# Patient Record
Sex: Male | Born: 1940
Health system: Southern US, Community
[De-identification: ages and names within clinical notes are randomized; demographics above are authoritative.]

## PROBLEM LIST (undated history)

## (undated) DIAGNOSIS — G4730 Sleep apnea, unspecified: Secondary | ICD-10-CM

## (undated) DIAGNOSIS — I10 Essential (primary) hypertension: Secondary | ICD-10-CM

## (undated) DIAGNOSIS — I2510 Atherosclerotic heart disease of native coronary artery without angina pectoris: Secondary | ICD-10-CM

## (undated) DIAGNOSIS — I5022 Chronic systolic (congestive) heart failure: Secondary | ICD-10-CM

## (undated) DIAGNOSIS — I428 Other cardiomyopathies: Secondary | ICD-10-CM

## (undated) DIAGNOSIS — I509 Heart failure, unspecified: Secondary | ICD-10-CM

## (undated) DIAGNOSIS — K219 Gastro-esophageal reflux disease without esophagitis: Secondary | ICD-10-CM

## (undated) DIAGNOSIS — I454 Nonspecific intraventricular block: Secondary | ICD-10-CM

## (undated) DIAGNOSIS — I4891 Unspecified atrial fibrillation: Secondary | ICD-10-CM

## (undated) DIAGNOSIS — G709 Myoneural disorder, unspecified: Secondary | ICD-10-CM

## (undated) DIAGNOSIS — I4819 Other persistent atrial fibrillation: Secondary | ICD-10-CM

## (undated) DIAGNOSIS — M48061 Spinal stenosis, lumbar region without neurogenic claudication: Secondary | ICD-10-CM

## (undated) DIAGNOSIS — E785 Hyperlipidemia, unspecified: Secondary | ICD-10-CM

## (undated) DIAGNOSIS — I4821 Permanent atrial fibrillation: Secondary | ICD-10-CM

## (undated) DIAGNOSIS — IMO0002 Reserved for concepts with insufficient information to code with codable children: Secondary | ICD-10-CM

## (undated) DIAGNOSIS — Z95810 Presence of automatic (implantable) cardiac defibrillator: Secondary | ICD-10-CM

## (undated) HISTORY — DX: Other persistent atrial fibrillation: I48.19

## (undated) HISTORY — DX: Essential (primary) hypertension: I10

## (undated) HISTORY — DX: Permanent atrial fibrillation: I48.21

## (undated) HISTORY — PX: HERNIA REPAIR: SHX51

## (undated) HISTORY — DX: Chronic systolic (congestive) heart failure: I50.22

## (undated) HISTORY — DX: Atherosclerotic heart disease of native coronary artery without angina pectoris: I25.10

## (undated) HISTORY — PX: LUMBAR LAMINECTOMY: SHX95

## (undated) HISTORY — PX: CATARACT EXTRACTION: SUR2

## (undated) HISTORY — DX: Hyperlipidemia, unspecified: E78.5

## (undated) HISTORY — DX: Spinal stenosis, lumbar region without neurogenic claudication: M48.061

## (undated) HISTORY — DX: Other cardiomyopathies: I42.8

## (undated) HISTORY — DX: Unspecified atrial fibrillation: I48.91

## (undated) HISTORY — DX: Reserved for concepts with insufficient information to code with codable children: IMO0002

## (undated) HISTORY — DX: Nonspecific intraventricular block: I45.4

---

## 2004-04-02 ENCOUNTER — Ambulatory Visit: Payer: Self-pay | Admitting: Specialist

## 2005-05-09 HISTORY — PX: OTHER SURGICAL HISTORY: SHX169

## 2005-12-08 ENCOUNTER — Ambulatory Visit: Payer: Self-pay

## 2006-11-20 ENCOUNTER — Ambulatory Visit: Payer: Self-pay | Admitting: Orthopaedic Surgery

## 2006-12-06 ENCOUNTER — Encounter: Admission: RE | Admit: 2006-12-06 | Discharge: 2006-12-06 | Payer: Self-pay | Admitting: Orthopaedic Surgery

## 2007-01-24 ENCOUNTER — Observation Stay (HOSPITAL_COMMUNITY): Admission: RE | Admit: 2007-01-24 | Discharge: 2007-01-25 | Payer: Self-pay | Admitting: Orthopaedic Surgery

## 2010-06-29 ENCOUNTER — Ambulatory Visit: Payer: Self-pay | Admitting: Gastroenterology

## 2010-07-01 LAB — PATHOLOGY REPORT

## 2010-09-21 NOTE — Op Note (Signed)
NAME:  Chris Price, Chris Price             ACCOUNT NO.:  [id number]   MEDICAL RECORD NO.:  [id number]          PATIENT TYPE:  INP   LOCATION:  5006                         FACILITY:  [facility]   PHYSICIAN:  Sharolyn Douglas, M.D.        DATE OF BIRTH:  [birth year]   DATE OF PROCEDURE:  01/24/2007  DATE OF DISCHARGE:                               OPERATIVE REPORT   DIAGNOSES:  1. Lumbar spinal stenosis.  2. Multilevel lumbar degenerative disk disease and disk herniations.   PROCEDURE PERFORMED:  Lumbar laminectomy, L2 through S1 with wide  decompression of the thecal sac and nerve roots.   SURGEON:  Sharolyn Douglas, M.D.   ASSISTANT:  Alecia Lemming, P.A.   ANESTHESIA:  General endotracheal.   ESTIMATED BLOOD LOSS:  150 cc.   COMPLICATIONS:  None.   COUNTS:  Needle and sponge count correct.   INDICATIONS:  The patient is a pleasant 70 year old gentleman with  chronic progressive back and bilateral lower extremity pain secondary to  severe underlying spinal stenosis due to ligamentum flavum hypertrophy  facet enlargement and disk herniations at multiple levels.  I have been  treating him conservatively for over three years and at this point his  symptoms have progressed to the point that he wishes to proceed with a  laminectomy in hopes of improving his situation.  The risks, benefits,  and alternatives were extensively reviewed and he has elected to  proceed.   DESCRIPTION OF PROCEDURE:  After informed consent he was taken to the  operating room.  He underwent general endotracheal anesthesia without  difficulty, given prophylactic IV antibiotics.  He was carefully turned  prone onto the Wilson frame.  All bony prominences were padded.  The  face and eyes were protected at all times.  The back was prepped and  draped in the usual sterile fashion.   A midline incision was made from L2 down to the sacrum.  Dissection was  carried sharply through the deep fascia and a subperiosteal exposure was  carried  out to the facet joints.  The pars interarticularis was exposed  at each level.  Care was taken to protect the facet joint capsules  themselves.  Intraoperative x-ray was taken to confirm the levels.  Deep  retractors were placed.   We then began a laminectomy by removing the entire spinous process and  lamina of L2, L3, L4, and L5.  A high-speed bur was then used to thin  the lamina.  Using loupes and headlight magnification, I worked closely  along with my P.A., Alecia Lemming, P.A., to complete the decompression.  She  provided suction and protection of the neural elements using the nerve  root retractor.  The ligamentum flavum was removed piecemeal.  We found  severe spinal stenosis, most advanced at L4-L5 and L5-S1 but also quite  significant in L2-L3 and L3-L4.  Even more severe was the lateral recess  narrowing which was secondary to enlargement of the facets and  overgrowth of the ligamentum flavum.  We worked carefully into the  lateral gutters identifying each nerve root, L2, L3, L4, L5,  and S1  bilaterally and decompressing it in the subarticular recess and out  towards the foramen.  There was a large focal and central disk  herniation at L4-L5 which I felt was too large not to address.   Working on the left side the L5 nerve root was mobilized and the disk  was entered with a 15 blade.  Using Epstein curets, the large disk  herniation was decompressed back into the interspace and the fragments  were removed using the pituitary rongeurs.  This significantly  decompressed the L5 nerve roots at this level.  I examined the disk at  the other levels and there was bulging, however, I did not enter the  disk spaces for fear of possibly destabilizing the spine.  A sublaminar  decompression was completed up underneath the L1-L2 interspace.  Hemostasis was achieved using FloSeal, bipolar electrocautery, and  Gelfoam.   The wound was copiously irrigated.  Gelfoam was left over the  exposed  epidural space.  A deep Hemovac drain was left.  The deep fascia was  closed with a running number one Vicryl suture.  The subcutaneous layer  was closed with 2-0 Vicryl and a running 3-0 subcuticular Vicryl suture  on the skin edges.  Dermabond was applied.  The patient was turned  supine, extubated without difficulty, and transferred to recovery in  stable condition.      Sharolyn Douglas, M.D.  Electronically Signed     MC/MEDQ  D:  01/24/2007  T:  01/25/2007  Job:  [id number]   cc:   Sharolyn Douglas, M.D.

## 2011-02-17 LAB — COMPREHENSIVE METABOLIC PANEL
ALT: 25
AST: 26
Calcium: 9.1
GFR calc Af Amer: >60
Sodium: 137
Total Protein: 6.9

## 2011-02-17 LAB — CBC
MCHC: 34
RDW: 14.1 — ABNORMAL HIGH

## 2011-02-17 LAB — URINE CULTURE

## 2011-02-17 LAB — URINALYSIS, ROUTINE W REFLEX MICROSCOPIC
Bilirubin Urine: NEGATIVE
Glucose, UA: NEGATIVE
Hgb urine dipstick: NEGATIVE
Hgb urine dipstick: NEGATIVE
Ketones, ur: NEGATIVE
Specific Gravity, Urine: 1.022
Urobilinogen, UA: 1
pH: 6

## 2011-02-17 LAB — TYPE AND SCREEN: Antibody Screen: NEGATIVE

## 2011-02-17 LAB — DIFFERENTIAL
Eosinophils Absolute: 0.2
Eosinophils Relative: 3
Lymphs Abs: 2.1
Monocytes Relative: 9
Neutrophils Relative %: 58

## 2011-02-17 LAB — PROTIME-INR: INR: 1

## 2011-05-10 HISTORY — PX: PROSTATE BIOPSY: SHX241

## 2011-07-21 ENCOUNTER — Ambulatory Visit: Payer: Self-pay | Admitting: Cardiology

## 2011-08-08 HISTORY — PX: CARDIOVERSION: SHX1299

## 2011-08-15 ENCOUNTER — Ambulatory Visit: Payer: Self-pay | Admitting: Cardiology

## 2011-08-15 DIAGNOSIS — I2510 Atherosclerotic heart disease of native coronary artery without angina pectoris: Secondary | ICD-10-CM

## 2011-08-15 HISTORY — PX: CARDIAC CATHETERIZATION: SHX172

## 2011-08-15 HISTORY — DX: Atherosclerotic heart disease of native coronary artery without angina pectoris: I25.10

## 2011-08-24 ENCOUNTER — Encounter: Payer: Self-pay | Admitting: *Deleted

## 2011-08-25 ENCOUNTER — Encounter: Payer: Self-pay | Admitting: Internal Medicine

## 2011-08-25 ENCOUNTER — Ambulatory Visit (INDEPENDENT_AMBULATORY_CARE_PROVIDER_SITE_OTHER): Payer: Medicare PPO | Admitting: Internal Medicine

## 2011-08-25 VITALS — BP 128/84 | HR 89 | Ht 66.0 in | Wt 225.0 lb

## 2011-08-25 DIAGNOSIS — I429 Cardiomyopathy, unspecified: Secondary | ICD-10-CM | POA: Insufficient documentation

## 2011-08-25 DIAGNOSIS — I4891 Unspecified atrial fibrillation: Secondary | ICD-10-CM

## 2011-08-25 DIAGNOSIS — R0602 Shortness of breath: Secondary | ICD-10-CM

## 2011-08-25 DIAGNOSIS — I454 Nonspecific intraventricular block: Secondary | ICD-10-CM | POA: Insufficient documentation

## 2011-08-25 DIAGNOSIS — I5022 Chronic systolic (congestive) heart failure: Secondary | ICD-10-CM

## 2011-08-25 HISTORY — DX: Chronic systolic (congestive) heart failure: I50.22

## 2011-08-25 MED ORDER — FUROSEMIDE 20 MG PO TABS: 20.0000 mg | ORAL_TABLET | Freq: Every day | ORAL | Status: DC

## 2011-08-25 MED ORDER — LISINOPRIL 20 MG PO TABS: 20.0000 mg | ORAL_TABLET | Freq: Every day | ORAL | Status: DC

## 2011-08-25 MED ORDER — SPIRONOLACTONE 12.5 MG HALF TABLET: 12.5000 mg | ORAL_TABLET | Freq: Every day | ORAL | Status: DC

## 2011-08-25 MED ORDER — AMIODARONE HCL 400 MG PO TABS: ORAL_TABLET | ORAL | Status: DC

## 2011-08-25 NOTE — Progress Notes (Signed)
Addended by: Tarri Fuller on: 08/25/2011 04:49 PM   Modules accepted: Orders

## 2011-08-25 NOTE — Assessment & Plan Note (Signed)
As above. We'll discontinue his hydrochlorothiazide and begin Lasix. In addition we'll add Aldactone as noted. We'll check his metabolic profile next week.

## 2011-08-25 NOTE — Assessment & Plan Note (Signed)
The patient has persistent atrial fibrillation and failed cardioversion. It is concurrent with his deteriorating functional status. Notably his left atrial dimension was 5.1.  I concur with Dr. Phylliss Bob recommendation that we try to restore sinus rhythm. It would be appropriate to use an antiarrhythmic drug first. His QTC is too long to allow for the use of a type III antiarrhythmic and so we would use amiodarone. I have reviewed side effects. We would anticipate doing this prior to ICD implantation so as to be able to effect cardioversion at the time of DFT testing.

## 2011-08-25 NOTE — Assessment & Plan Note (Addendum)
As noted above. We will also add Aldactone because of benefits for mortality morbidity reduction  His had persistent left ventricular dysfunction now times years on appropriate guidelines recommend medical therapy. We will plan to undertake ICD implantation which would be a dual-chamber device as we hope to restore sinus rhythm.Have reviewed the potential benefits and risks of ICD implantation including but not limited to death, perforation of heart or lung, lead dislodgement, infection,  device malfunction and inappropriate shocks.  The  Family express understanding  and are willing to proceed.

## 2011-08-25 NOTE — Assessment & Plan Note (Signed)
He is not a candidate for CRT because of his QRS morphology and narrow-relative QRS

## 2011-08-25 NOTE — Patient Instructions (Addendum)
STOP LISINIOPRIL/HCTZ AND START LISINOPRIL 20 MG DAILY, START LASIX 20 MG DAILY AND START AMIODARONE 400 MG TAKE 400 MG TWICE DAILY FOR 2 WEEKS THEN DECREASE TO 400 MG DAILY, START ALDACTONE 12.5 MG DAILY  WE WILL CALL YOU IN THE NEXT FEW DAYS AS TO WHEN YOU WILL BE SCHEDULED FOR AN ICD IMPLANT TO BE DONE @ [facility] WITH DR. Graciela Husbands, DR. KLEIN'S NURSE HEATHER MCGHEE, RN WILL CALL YOU OR YOU CAN CALL [phone]   BMET @ [facility] 09/02/11 @ 2:30

## 2011-08-25 NOTE — Progress Notes (Signed)
CARDIOLOGY CONSULT NOTE  Patient ID: [name], MRN: [id number], DOB/AGE: Jun 18, 1940 71 y.o. Admit date: (Not on file) Date of Consult: 08/25/2011  Primary Physician: Chris Cheng, MD, MD Primary Cardiologist: AP  Chief Complaint: ? ICD   HPI Chris Price is a 71 y.o. male : seen at the request of Dr AP for consideration of ICD  Implantation and also management of AFIB  The cardiomyopathy identified about 3 year ago>>EF 27%  This improved with treatment with sleep apnea.  Presumably this is felt to be nonischemic.  Last fall he started noticing some exercise associated breathlessness. There is no edema. He saw his PCP who identified atrial fibrillation in early February as a new finding and he was referred back to Dr. AP.   He previously been on ACE inhibitor therapy and beta blocker therapy for the aforementioned cardiomyopathy  and pradaxa was started. About a month ago attempted cardioversion failed x3.  Intercurrent evaluation has included an echo that demonstrated left atrial dimension of 51, left ventricular function  moderately-severely depressed; he quantitated ejection fraction of 25% by Myoview and a catheterization demonstrated diffuse nonobstructive disease.  Thromboembolic risk factors notable for hypertension left ventricular dysfunction and age.  Electrocardiogram demonstrates a nonspecific IVCD and a QRS duration of about 120 ms.    Past Medical History  Diagnosis Date  . Lumbar spinal stenosis   . Degenerative disk disease     lumbar  . Disc herniation   . Hyperlipidemia   . Cardiomyopathy     moderate to severe  . Arrhythmia     a-fib  . Sleep apnea     obstructive; on CPAP  . Hypertension   . Coronary artery disease 08/15/2011    insignificant CAD with 60% mid LAD, 60% proximal RCA and 30 % stenossis left circumflex.      Surgical History:  Past Surgical History  Procedure Date  . Lumbar laminectomy     L2 through S1 with wide  decompression of the thecal sac and nerve roots.  . Cardioversion 08/2011    [facility]  . Cardiac catheterization 08/15/2011    [facility]      Home Meds: Prior to Admission medications   Medication Sig Start Date End Date Taking? Authorizing Provider  amLODipine (NORVASC) 5 MG tablet Take 5 mg by mouth daily.   Yes Historical Provider, MD  atorvastatin (LIPITOR) 40 MG tablet Take 40 mg by mouth daily.   Yes Historical Provider, MD  carvedilol (COREG) 25 MG tablet Take 25 mg by mouth 2 (two) times daily with a meal.   Yes Historical Provider, MD  Coenzyme Q10-Levocarnitine (CO Q-10 PLUS PO) Take by mouth.   Yes Historical Provider, MD  dabigatran (PRADAXA) 150 MG CAPS Take 150 mg by mouth 2 (two) times daily.   Yes Historical Provider, MD  fish oil-omega-3 fatty acids 1000 MG capsule Take 2 g by mouth 3 (three) times daily.   Yes Historical Provider, MD  Glucosamine HCl 1500 MG TABS Take by mouth daily.   Yes Historical Provider, MD  lisinopril-hydrochlorothiazide (PRINZIDE,ZESTORETIC) 20-25 MG per tablet Take 1 tablet by mouth daily.   Yes Historical Provider, MD  naproxen sodium (ANAPROX) 220 MG tablet Take 220 mg by mouth 2 (two) times daily as needed.   Yes Historical Provider, MD  omeprazole (PRILOSEC) 20 MG capsule Take 20 mg by mouth daily at 2 PM daily at 2 PM.   Yes Historical Provider, MD  SAW PALMETTO, SERENOA REPENS, PO Take  by mouth.   Yes Historical Provider, MD    Allergies: Allergies not on file  History   Social History  . Marital Status: Married    Spouse Name: N/A    Number of Children: N/A  . Years of Education: N/A   Occupational History  . Not on file.   Social History Main Topics  . Smoking status: Never Smoker   . Smokeless tobacco: Not on file  . Alcohol Use: No  . Drug Use: No  . Sexually Active:    Other Topics Concern  . Not on file   Social History Narrative  . No narrative on file     Family History  Problem Relation Age of Onset  . Heart attack  Father   . Heart attack Brother      ROS:  Please see the history of present illness.     All other systems reviewed and negative except for lifelong esotropia   Physical Exam: Blood pressure 128/84, height 5\' 6"  (1.676 m), weight 225 lb (102.059 kg). General: Well developed, well nourished male in no acute distress. Head: Normocephalic, atraumatic, sclera non-icteric, no xanthomas, nares are without discharge. Lymph Nodes:  none Neck: Negative for carotid bruits. JVD 9-10cm Lungs: Clear bilaterally to auscultation without wheezes, rales, or rhonchi. Breathing is unlabored. Heart:Irregullarly irrregular with S1 S2. No murmurs, rubs, or gallops appreciated. Abdomen: Soft, non-tender, non-distended with normoactive bowel sounds. No hepatomegaly. No rebound/guarding. No obvious abdominal masses. Msk:  Strength and tone appear normal for age. Extremities: No clubbing or cyanosis. No edema.  Distal pedal pulses are 2+ and equal bilaterally. Skin: Warm and Dry BAck without kyhposis Neuro: Alert and oriented X 3. CN III-XII intact Grossly normal sensory and motor function . Psych:  Responds to questions appropriately with a normal affect.      Labs: Cardiac Enzymes No results found for this basename: CKTOTAL:4,CKMB:4,TROPONINI:4 in the last 72 hours CBC Lab Results  Component Value Date   WBC 7.3 01/23/2007   HGB 15.0 01/23/2007   HCT 44.0 01/23/2007   MCV 96.7 01/23/2007   PLT 259 01/23/2007     EKG: Atrial fibrillation at 89 with nonspecific IVCD notably Q waves in lead aVL and V6 and a QRS duration of 122 axis is leftward -81   Assessment and Plan:  Chris Price

## 2011-08-29 ENCOUNTER — Telehealth: Payer: Self-pay

## 2011-08-29 LAB — CBC
HCT: 44.2 % (ref 40.0–52.0)
Platelet: 199 10*3/uL (ref 150–440)
RBC: 4.59 10*6/uL (ref 4.40–5.90)
RDW: 14.7 % — ABNORMAL HIGH (ref 11.5–14.5)
WBC: 7.6 10*3/uL (ref 3.8–10.6)

## 2011-08-29 LAB — COMPREHENSIVE METABOLIC PANEL
Albumin: 3.9 g/dL (ref 3.4–5.0)
Alkaline Phosphatase: 60 U/L (ref 50–136)
BUN: 16 mg/dL (ref 7–18)
Bilirubin,Total: 0.8 mg/dL (ref 0.2–1.0)
Co2: 25 mmol/L (ref 21–32)
Creatinine: 1.26 mg/dL (ref 0.60–1.30)
EGFR (African American): >60
EGFR (Non-African Amer.): 57 — ABNORMAL LOW
SGOT(AST): 31 U/L (ref 15–37)
Sodium: 142 mmol/L (ref 136–145)
Total Protein: 7.5 g/dL (ref 6.4–8.2)

## 2011-08-29 LAB — CK TOTAL AND CKMB (NOT AT ARMC): CK, Total: 328 U/L — ABNORMAL HIGH (ref 35–232)

## 2011-08-29 LAB — TROPONIN I: Troponin-I: <0.02 ng/mL

## 2011-08-29 NOTE — Telephone Encounter (Signed)
Would like to switch to Auto Mode for two weeks on CPAP machine since his AHI is 6.3; normal is below 5 close to 0 as possible. Spoke with Sigmund Hazel respiratory therapist contact number is [phone].

## 2011-08-29 NOTE — Telephone Encounter (Signed)
Who ever is in charge should do this  Thanks sharon

## 2011-08-30 ENCOUNTER — Inpatient Hospital Stay: Payer: Self-pay | Admitting: Internal Medicine

## 2011-08-30 LAB — CK TOTAL AND CKMB (NOT AT ARMC): CK-MB: 5.6 ng/mL — ABNORMAL HIGH (ref 0.5–3.6)

## 2011-08-30 LAB — PRO B NATRIURETIC PEPTIDE: B-Type Natriuretic Peptide: 1469 pg/mL — ABNORMAL HIGH (ref 0–125)

## 2011-08-30 LAB — TROPONIN I: Troponin-I: <0.02 ng/mL

## 2011-08-30 NOTE — Telephone Encounter (Signed)
Spoke to [employer] regarding the CPAP auto; she will be the one to take care of this for the patient.

## 2011-08-30 NOTE — Telephone Encounter (Signed)
LMOM to have Misty Stanley contact our office.

## 2011-08-31 LAB — BASIC METABOLIC PANEL
Chloride: 106 mmol/L (ref 98–107)
Co2: 27 mmol/L (ref 21–32)
Creatinine: 1.1 mg/dL (ref 0.60–1.30)
EGFR (African American): >60
EGFR (Non-African Amer.): >60
Glucose: 101 mg/dL — ABNORMAL HIGH (ref 65–99)
Osmolality: 281 (ref 275–301)
Potassium: 4 mmol/L (ref 3.5–5.1)
Sodium: 140 mmol/L (ref 136–145)

## 2011-08-31 LAB — LIPID PANEL
HDL Cholesterol: 40 mg/dL (ref 40–60)
Ldl Cholesterol, Calc: 127 mg/dL — ABNORMAL HIGH (ref 0–100)
Triglycerides: 77 mg/dL (ref 0–200)

## 2011-09-02 ENCOUNTER — Telehealth: Payer: Self-pay

## 2011-09-02 ENCOUNTER — Other Ambulatory Visit: Payer: Medicare PPO

## 2011-09-02 DIAGNOSIS — I4891 Unspecified atrial fibrillation: Secondary | ICD-10-CM

## 2011-09-02 DIAGNOSIS — R0602 Shortness of breath: Secondary | ICD-10-CM

## 2011-09-02 NOTE — Telephone Encounter (Signed)
Patient came in for BMET today; just got out of [facility].  He was thinking he should change his pradaxa to warfarin before cardioversion. Did you want the patient to change from pradaxa to warfarin before having cardioversion? The cardioversion is not set up yet. I will also set up the defib implant 3 weeks after cardioversion as Dr. Graciela Husbands requested. Please advise.

## 2011-09-03 LAB — BASIC METABOLIC PANEL
BUN/Creatinine Ratio: 20 (ref 10–22)
BUN: 24 mg/dL (ref 8–27)
CO2: 22 mmol/L (ref 20–32)
Chloride: 97 mmol/L (ref 97–108)
Glucose: 88 mg/dL (ref 65–99)

## 2011-09-05 NOTE — Telephone Encounter (Signed)
S  He can continue the pradaxa  We should try and do DCCV asap (sort of?) when you set that up the issue is whether he has been taking it daily for >3 weeks or we will need a TEE Thanks steve

## 2011-09-06 ENCOUNTER — Encounter (HOSPITAL_COMMUNITY): Payer: Self-pay | Admitting: Pharmacy Technician

## 2011-09-06 NOTE — Telephone Encounter (Signed)
Patient will have a cardioversion in the EP lab per Dr. Graciela Husbands for Sep 08, 2011 arrive at 5:30. The patient was instructed of medications to stop which was furosemide. The patient will remain on all other medications. The patient understands instructions of medications and where to go for cardioversion.

## 2011-09-08 ENCOUNTER — Encounter (HOSPITAL_COMMUNITY): Payer: Self-pay | Admitting: General Practice

## 2011-09-08 ENCOUNTER — Ambulatory Visit (HOSPITAL_COMMUNITY)
Admission: RE | Admit: 2011-09-08 | Discharge: 2011-09-09 | Disposition: A | Discharge status: Home / Self Care | Payer: Medicare PPO | Source: Ambulatory Visit | Attending: Internal Medicine | Admitting: Internal Medicine

## 2011-09-08 ENCOUNTER — Ambulatory Visit (HOSPITAL_COMMUNITY): Payer: Medicare PPO

## 2011-09-08 ENCOUNTER — Encounter (HOSPITAL_COMMUNITY)
Admission: RE | Disposition: A | Discharge status: Home / Self Care | Payer: Self-pay | Source: Ambulatory Visit | Attending: Internal Medicine

## 2011-09-08 DIAGNOSIS — I428 Other cardiomyopathies: Secondary | ICD-10-CM

## 2011-09-08 DIAGNOSIS — I5022 Chronic systolic (congestive) heart failure: Secondary | ICD-10-CM

## 2011-09-08 DIAGNOSIS — I10 Essential (primary) hypertension: Secondary | ICD-10-CM | POA: Insufficient documentation

## 2011-09-08 DIAGNOSIS — I4891 Unspecified atrial fibrillation: Secondary | ICD-10-CM

## 2011-09-08 DIAGNOSIS — R0602 Shortness of breath: Secondary | ICD-10-CM

## 2011-09-08 DIAGNOSIS — Z95810 Presence of automatic (implantable) cardiac defibrillator: Secondary | ICD-10-CM

## 2011-09-08 DIAGNOSIS — I498 Other specified cardiac arrhythmias: Secondary | ICD-10-CM | POA: Insufficient documentation

## 2011-09-08 HISTORY — DX: Myoneural disorder, unspecified: G70.9

## 2011-09-08 HISTORY — PX: IMPLANTABLE CARDIOVERTER DEFIBRILLATOR IMPLANT: SHX5473

## 2011-09-08 HISTORY — DX: Sleep apnea, unspecified: G47.30

## 2011-09-08 HISTORY — DX: Gastro-esophageal reflux disease without esophagitis: K21.9

## 2011-09-08 HISTORY — DX: Heart failure, unspecified: I50.9

## 2011-09-08 HISTORY — PX: OTHER SURGICAL HISTORY: SHX169

## 2011-09-08 HISTORY — DX: Presence of automatic (implantable) cardiac defibrillator: Z95.810

## 2011-09-08 LAB — APTT: aPTT: 61 seconds — ABNORMAL HIGH (ref 24–37)

## 2011-09-08 LAB — CBC
HCT: 44.7 % (ref 39.0–52.0)
Hemoglobin: 15.8 g/dL (ref 13.0–17.0)
MCHC: 35.3 g/dL (ref 30.0–36.0)

## 2011-09-08 LAB — SURGICAL PCR SCREEN: Staphylococcus aureus: NEGATIVE

## 2011-09-08 LAB — PROTIME-INR: INR: 1.63 — ABNORMAL HIGH (ref 0.00–1.49)

## 2011-09-08 SURGERY — IMPLANTABLE CARDIOVERTER DEFIBRILLATOR IMPLANT

## 2011-09-08 MED ORDER — LIDOCAINE HCL (PF) 1 % IJ SOLN
INTRAMUSCULAR | Status: AC
  Filled 2011-09-08: qty 60

## 2011-09-08 MED ORDER — SODIUM CHLORIDE 0.9 % IV SOLN
INTRAVENOUS | Status: AC
  Administered 2011-09-08: 12:00:00 via INTRAVENOUS

## 2011-09-08 MED ORDER — SODIUM CHLORIDE 0.9 % IR SOLN
80.0000 mg | Status: DC
  Filled 2011-09-08: qty 2

## 2011-09-08 MED ORDER — POTASSIUM CHLORIDE CRYS ER 20 MEQ PO TBCR
20.0000 meq | EXTENDED_RELEASE_TABLET | Freq: Every day | ORAL | Status: DC
  Administered 2011-09-08 – 2011-09-09 (×2): 20 meq via ORAL
  Filled 2011-09-08 (×2): qty 1

## 2011-09-08 MED ORDER — ATORVASTATIN CALCIUM 40 MG PO TABS
40.0000 mg | ORAL_TABLET | Freq: Every day | ORAL | Status: DC
  Administered 2011-09-08: 40 mg via ORAL
  Filled 2011-09-08 (×2): qty 1

## 2011-09-08 MED ORDER — CARVEDILOL 25 MG PO TABS
25.0000 mg | ORAL_TABLET | Freq: Two times a day (BID) | ORAL | Status: DC
  Administered 2011-09-08 – 2011-09-09 (×2): 25 mg via ORAL
  Filled 2011-09-08 (×4): qty 1

## 2011-09-08 MED ORDER — LIDOCAINE HCL (PF) 1 % IJ SOLN
INTRAMUSCULAR | Status: AC
  Filled 2011-09-08: qty 30

## 2011-09-08 MED ORDER — AMLODIPINE BESYLATE 5 MG PO TABS
5.0000 mg | ORAL_TABLET | Freq: Every day | ORAL | Status: DC
  Administered 2011-09-09: 5 mg via ORAL
  Filled 2011-09-08: qty 1

## 2011-09-08 MED ORDER — SODIUM CHLORIDE 0.45 % IV SOLN
INTRAVENOUS | Status: DC
  Administered 2011-09-08: 1000 mL via INTRAVENOUS

## 2011-09-08 MED ORDER — MUPIROCIN 2 % EX OINT
TOPICAL_OINTMENT | CUTANEOUS | Status: AC
  Filled 2011-09-08: qty 22

## 2011-09-08 MED ORDER — CEFAZOLIN SODIUM-DEXTROSE 2-3 GM-% IV SOLR
2.0000 g | INTRAVENOUS | Status: DC
  Filled 2011-09-08: qty 50

## 2011-09-08 MED ORDER — MUPIROCIN 2 % EX OINT
TOPICAL_OINTMENT | Freq: Two times a day (BID) | CUTANEOUS | Status: DC
  Administered 2011-09-08: 1 via NASAL
  Filled 2011-09-08: qty 22

## 2011-09-08 MED ORDER — SODIUM CHLORIDE 0.9 % IJ SOLN: 3.0000 mL | INTRAMUSCULAR | Status: DC | PRN

## 2011-09-08 MED ORDER — PANTOPRAZOLE SODIUM 40 MG PO TBEC
40.0000 mg | DELAYED_RELEASE_TABLET | Freq: Every day | ORAL | Status: DC
  Administered 2011-09-08: 40 mg via ORAL
  Filled 2011-09-08: qty 1

## 2011-09-08 MED ORDER — SODIUM CHLORIDE 0.9 % IV SOLN: 250.0000 mL | INTRAVENOUS | Status: DC

## 2011-09-08 MED ORDER — MIDAZOLAM HCL 5 MG/5ML IJ SOLN
INTRAMUSCULAR | Status: AC
  Filled 2011-09-08: qty 5

## 2011-09-08 MED ORDER — FENTANYL CITRATE 0.05 MG/ML IJ SOLN
INTRAMUSCULAR | Status: AC
  Filled 2011-09-08: qty 2

## 2011-09-08 MED ORDER — OXYCODONE-ACETAMINOPHEN 5-325 MG PO TABS
1.0000 | ORAL_TABLET | ORAL | Status: DC | PRN
  Administered 2011-09-08: 2 via ORAL
  Administered 2011-09-08: 1 via ORAL
  Administered 2011-09-09: 2 via ORAL
  Filled 2011-09-08 (×2): qty 2

## 2011-09-08 MED ORDER — AMIODARONE HCL 200 MG PO TABS
400.0000 mg | ORAL_TABLET | Freq: Every day | ORAL | Status: DC
  Administered 2011-09-09: 400 mg via ORAL
  Filled 2011-09-08: qty 2

## 2011-09-08 MED ORDER — CEFAZOLIN SODIUM 1-5 GM-% IV SOLN
1.0000 g | Freq: Four times a day (QID) | INTRAVENOUS | Status: AC
  Administered 2011-09-08 – 2011-09-09 (×3): 1 g via INTRAVENOUS
  Filled 2011-09-08 (×3): qty 50

## 2011-09-08 MED ORDER — FUROSEMIDE 20 MG PO TABS
20.0000 mg | ORAL_TABLET | Freq: Two times a day (BID) | ORAL | Status: DC
  Filled 2011-09-08 (×2): qty 1

## 2011-09-08 MED ORDER — DABIGATRAN ETEXILATE MESYLATE 150 MG PO CAPS
150.0000 mg | ORAL_CAPSULE | Freq: Two times a day (BID) | ORAL | Status: DC
  Administered 2011-09-08 – 2011-09-09 (×2): 150 mg via ORAL
  Filled 2011-09-08 (×3): qty 1

## 2011-09-08 MED ORDER — OXYCODONE-ACETAMINOPHEN 5-325 MG PO TABS
2.0000 | ORAL_TABLET | ORAL | Status: DC | PRN
  Filled 2011-09-08: qty 1

## 2011-09-08 MED ORDER — ONDANSETRON HCL 4 MG/2ML IJ SOLN: 4.0000 mg | Freq: Four times a day (QID) | INTRAMUSCULAR | Status: DC | PRN

## 2011-09-08 MED ORDER — CHLORHEXIDINE GLUCONATE 4 % EX LIQD
60.0000 mL | Freq: Once | CUTANEOUS | Status: DC
  Filled 2011-09-08: qty 60

## 2011-09-08 MED ORDER — SODIUM CHLORIDE 0.9 % IJ SOLN: 3.0000 mL | Freq: Two times a day (BID) | INTRAMUSCULAR | Status: DC

## 2011-09-08 MED ORDER — FUROSEMIDE 20 MG PO TABS
20.0000 mg | ORAL_TABLET | Freq: Every day | ORAL | Status: DC
  Administered 2011-09-08 – 2011-09-09 (×2): 20 mg via ORAL
  Filled 2011-09-08 (×2): qty 1

## 2011-09-08 MED ORDER — SPIRONOLACTONE 12.5 MG HALF TABLET
12.5000 mg | ORAL_TABLET | Freq: Every day | ORAL | Status: DC
  Administered 2011-09-08 – 2011-09-09 (×2): 12.5 mg via ORAL
  Filled 2011-09-08 (×2): qty 1

## 2011-09-08 MED ORDER — ACETAMINOPHEN 325 MG PO TABS
325.0000 mg | ORAL_TABLET | ORAL | Status: DC | PRN
  Administered 2011-09-08: 650 mg via ORAL
  Filled 2011-09-08: qty 2

## 2011-09-08 NOTE — Op Note (Signed)
Chris Price, Chris Price NO.:  [id number]  MEDICAL RECORD NO.:  [id number]  LOCATION:  2503                         FACILITY:  [facility]  PHYSICIAN:  Duke Salvia, MD, FACCDATE OF BIRTH:  [birth year]  DATE OF PROCEDURE:  09/08/2011 DATE OF DISCHARGE:                              OPERATIVE REPORT   PREOPERATIVE DIAGNOSIS:  Nonischemic cardiomyopathy, sinus bradycardia.  POSTOPERATIVE DIAGNOSIS:  Nonischemic cardiomyopathy, sinus bradycardia.  PROCEDURES:  Dual-chamber defibrillator implantation with intraoperative defibrillation threshold testing.  Following obtained informed consent, the patient was brought to the electrophysiology laboratory and placed on the fluoroscopic table in supine position.  After routine prep and drape of left upper chest, lidocaine was infiltrated in prepectoral subclavicular region.  An incision was made and carried down to layer of the prepectoral fascia using electrocautery and sharp dissection.  A pocket was formed similarly.  Hemostasis was obtained.  Thereafter attention was turned to gain access to the extrathoracic left subclavian vein which was accomplished with modest difficulty.  Because of the really cephalad course of the vein, subsequently 2 venipunctures were accomplished without aspiration of air or puncture of the artery. Guidewires were placed and retained sequentially.  A 9-French and 7- French sheaths were placed, which were passed a Medtronic [id number] single coil defibrillator lead, serial #[id number] and a Medtronic 5076 52 cm active fixation, atrial lead, serial #[id number].  Under fluoroscopic guidance, these were manipulated to the right ventricular apex (distal septum) and right atrial appendage respectively with a bipolar R-wave of 9.2 with a pace impedance of 856, a threshold of 0.8 at 0.5.  Current of injury was brisk.  There was no diaphragmatic pacing at 10 V.  The current at threshold was about 1 mA.  The lead  was secured to the prepectoral fascia.  The bipolar P-wave was 3.8 with a pace impedance of 703, threshold of 1.1 V at 0.5 msec.  The current of injury was moderate, current of threshold was 2.1 mA and there was no diaphragmatic pacing at 10 V.  The current of injury was 1.4 mA.  This lead was secured to the prepectoral fascia and the leads were then attached to Medtronic [id number] device, serial #[id number] through the device.  Bipolar R-wave was 7.4.  The pace impedance of 589, threshold of 0.5 at 0.4.  The bipolar P-wave was 2.8 with a pace impedance of 475, a threshold of 1.25 at 0.4.  High-voltage impedance was 76 ohms.  At this point, the device was implanted.  The pocket was copiously irrigated with antibiotic containing saline solution.  Hemostasis was assured.  Leads and pulse generator were placed in the pocket, secured to the prepectoral fascia.  Surgicel was used at the cephalad aspect of the pocket.  The wound was closed in 3 layers in normal fashion.  A pressure dressing was applied.  The patient was on full dose Pradaxa.  I then scrubbed out and we undertook defibrillation threshold testing. The patient I should note had been supported with CPAP because of hypoventilation and borderline hypoxemia through the case.  O2 sats were in the low 90s throughout.  This was true following the CPAP.  Prior to that, O2 sats were in  the high 80s with sedation that had been low 90s on arrival.  At this point, ventricular fibrillation was induced via the T-wave shock.  After total duration of 7 seconds, a 15 joule shock was delivered through a measured resistance of 71 ohms terminating ventricular fibrillation and restoring an atrial paced rhythm.  The patient was then allowed to awaken.  The patient tolerated the procedure without apparent complication.  Needle counts, sponge counts, and instrument counts were correct at the end of the procedure according to  staff.     Duke Salvia, MD, [facility]     SCK/MEDQ  D:  09/08/2011  T:  09/08/2011  Job:  [id number]

## 2011-09-08 NOTE — Interval H&P Note (Signed)
History and Physical Interval Note:  09/08/2011 8:12 AM  Chris Price  has presented today for surgery, with the diagnosis of afib  The various methods of treatment have been discussed with the patient and family. After consideration of risks, benefits and other options for treatment, the patient has consented to  Procedure(s) (LRB): Implantable Defibrillator  I have reviewed the patients' chart and labs.  Questions were answered to the patient's satisfaction.   Have reviewed the potential benefits and risks of ICD implantation including but not limited to death, perforation of heart or lung, lead dislodgement, infection,  device malfunction and inappropriate shocks.  The  Family express understanding  and are willing to proceed.      Sherryl Manges

## 2011-09-08 NOTE — CV Procedure (Signed)
See dictation [phone] ICD implant for primary prevention  Nonischemic caridomyopathy and sinus brady

## 2011-09-08 NOTE — Progress Notes (Signed)
Orthopedic Tech Progress Note Patient Details:  Chris Price Feb 26, 1941 [id number]  Patient ID: Chris Price, male   DOB: May 08, 1941, 71 y.o.   MRN: [id number] Confirmed pt is wearing arm sling.  Matea Stanard T 09/08/2011, 1:32 PM

## 2011-09-08 NOTE — H&P (View-Only) (Signed)
 CARDIOLOGY CONSULT NOTE  Patient ID: Chris Price, MRN: [id number], DOB/AGE: 10/03/1940 71 y.o. Admit date: (Not on file) Date of Consult: 08/25/2011  Primary Physician: MOFFETT, JOEL, MD, MD Primary Cardiologist: AP  Chief Complaint: ? ICD   HPI Chris Price is a 71 y.o. male : seen at the request of Dr AP for consideration of ICD  Implantation and also management of AFIB  The cardiomyopathy identified about 3 year ago>>EF 27%  This improved with treatment with sleep apnea.  Presumably this is felt to be nonischemic.  Last fall he started noticing some exercise associated breathlessness. There is no edema. He saw his PCP who identified atrial fibrillation in early February as a new finding and he was referred back to Dr. AP.   He previously been on ACE inhibitor therapy and beta blocker therapy for the aforementioned cardiomyopathy  and pradaxa was started. About a month ago attempted cardioversion failed x3.  Intercurrent evaluation has included an echo that demonstrated left atrial dimension of 51, left ventricular function  moderately-severely depressed; he quantitated ejection fraction of 25% by Myoview and a catheterization demonstrated diffuse nonobstructive disease.  Thromboembolic risk factors notable for hypertension left ventricular dysfunction and age.  Electrocardiogram demonstrates a nonspecific IVCD and a QRS duration of about 120 ms.    Past Medical History  Diagnosis Date  . Lumbar spinal stenosis   . Degenerative disk disease     lumbar  . Disc herniation   . Hyperlipidemia   . Cardiomyopathy     moderate to severe  . Arrhythmia     a-fib  . Sleep apnea     obstructive; on CPAP  . Hypertension   . Coronary artery disease 08/15/2011    insignificant CAD with 60% mid LAD, 60% proximal RCA and 30 % stenossis left circumflex.      Surgical History:  Past Surgical History  Procedure Date  . Lumbar laminectomy     L2 through S1 with wide  decompression of the thecal sac and nerve roots.  . Cardioversion 08/2011    [facility]  . Cardiac catheterization 08/15/2011    [facility]      Home Meds: Prior to Admission medications   Medication Sig Start Date End Date Taking? Authorizing Provider  amLODipine (NORVASC) 5 MG tablet Take 5 mg by mouth daily.   Yes Historical Provider, MD  atorvastatin (LIPITOR) 40 MG tablet Take 40 mg by mouth daily.   Yes Historical Provider, MD  carvedilol (COREG) 25 MG tablet Take 25 mg by mouth 2 (two) times daily with a meal.   Yes Historical Provider, MD  Coenzyme Q10-Levocarnitine (CO Q-10 PLUS PO) Take by mouth.   Yes Historical Provider, MD  dabigatran (PRADAXA) 150 MG CAPS Take 150 mg by mouth 2 (two) times daily.   Yes Historical Provider, MD  fish oil-omega-3 fatty acids 1000 MG capsule Take 2 g by mouth 3 (three) times daily.   Yes Historical Provider, MD  Glucosamine HCl 1500 MG TABS Take by mouth daily.   Yes Historical Provider, MD  lisinopril-hydrochlorothiazide (PRINZIDE,ZESTORETIC) 20-25 MG per tablet Take 1 tablet by mouth daily.   Yes Historical Provider, MD  naproxen sodium (ANAPROX) 220 MG tablet Take 220 mg by mouth 2 (two) times daily as needed.   Yes Historical Provider, MD  omeprazole (PRILOSEC) 20 MG capsule Take 20 mg by mouth daily at 2 PM daily at 2 PM.   Yes Historical Provider, MD  SAW PALMETTO, SERENOA REPENS, PO Take   by mouth.   Yes Historical Provider, MD    Allergies: Allergies not on file  History   Social History  . Marital Status: Married    Spouse Name: N/A    Number of Children: N/A  . Years of Education: N/A   Occupational History  . Not on file.   Social History Main Topics  . Smoking status: Never Smoker   . Smokeless tobacco: Not on file  . Alcohol Use: No  . Drug Use: No  . Sexually Active:    Other Topics Concern  . Not on file   Social History Narrative  . No narrative on file     Family History  Problem Relation Age of Onset  . Heart attack  Father   . Heart attack Brother      ROS:  Please see the history of present illness.     All other systems reviewed and negative except for lifelong esotropia   Physical Exam: Blood pressure 128/84, height 5' 6" (1.676 m), weight 225 lb (102.059 kg). General: Well developed, well nourished male in no acute distress. Head: Normocephalic, atraumatic, sclera non-icteric, no xanthomas, nares are without discharge. Lymph Nodes:  none Neck: Negative for carotid bruits. JVD 9-10cm Lungs: Clear bilaterally to auscultation without wheezes, rales, or rhonchi. Breathing is unlabored. Heart:Irregullarly irrregular with S1 S2. No murmurs, rubs, or gallops appreciated. Abdomen: Soft, non-tender, non-distended with normoactive bowel sounds. No hepatomegaly. No rebound/guarding. No obvious abdominal masses. Msk:  Strength and tone appear normal for age. Extremities: No clubbing or cyanosis. No edema.  Distal pedal pulses are 2+ and equal bilaterally. Skin: Warm and Dry BAck without kyhposis Neuro: Alert and oriented X 3. CN III-XII intact Grossly normal sensory and motor function . Psych:  Responds to questions appropriately with a normal affect.      Labs: Cardiac Enzymes No results found for this basename: CKTOTAL:4,CKMB:4,TROPONINI:4 in the last 72 hours CBC Lab Results  Component Value Date   WBC 7.3 01/23/2007   HGB 15.0 01/23/2007   HCT 44.0 01/23/2007   MCV 96.7 01/23/2007   PLT 259 01/23/2007     EKG: Atrial fibrillation at 89 with nonspecific IVCD notably Q waves in lead aVL and V6 and a QRS duration of 122 axis is leftward -81   Assessment and Plan:  Necie Price  

## 2011-09-08 NOTE — Progress Notes (Signed)
Pt. States he and/or family member present at bedside is able to place himself on /off CPAP. RT encouraged patient to call if assistance needed.

## 2011-09-09 ENCOUNTER — Encounter: Payer: Self-pay | Admitting: *Deleted

## 2011-09-09 ENCOUNTER — Ambulatory Visit (HOSPITAL_COMMUNITY): Payer: Medicare PPO

## 2011-09-09 DIAGNOSIS — I5022 Chronic systolic (congestive) heart failure: Secondary | ICD-10-CM

## 2011-09-09 DIAGNOSIS — Z95810 Presence of automatic (implantable) cardiac defibrillator: Secondary | ICD-10-CM | POA: Insufficient documentation

## 2011-09-09 NOTE — Discharge Summary (Signed)
Discharge Summary   Patient ID: Chris Price MRN: [id number], DOB/AGE: January 24, 1941 71 y.o. Admit date: 09/08/2011 D/C date:     09/09/2011    Primary Discharge Diagnoses:  1.  NICM s/p dual chamber ICD implantation  Secondary Discharge Diagnoses:  1.  Chronic systolic CHF 2.  Atrial fibrillation 3.  Coronary artery disease 4.  HTN 5.  OSA 6.  Dyslipidemia  Procedures performed: 1.  Dual chamber ICD implantation performed by Dr. Sherryl Manges on Sep 08, 2011 - Medtronic D314DRG device, serial #[id number], Medtronic [phone] single coil defibrillator lead, serial [id number] and a Medtronic 5076 52 cm active fixation, atrial lead, serial #[id number]; please see operative note for full details  Hospital Course:  Chris Price is a 71 year old gentleman who was seen at the request of Dr. AP for consideration of ICD Implantation and also management of AF. The cardiomyopathy was identified about 3 year ago, EF 27%. This improved with treatment of sleep apnea. Presumably this is felt to be nonischemic. Last fall he started noticing some exercise associated breathlessness. There was no edema, orthopnea or PND. He saw his PCP who identified atrial fibrillation in early February as a new finding and he was referred back to Dr. AP. He previously been on ACE inhibitor therapy and beta blocker therapy for the aforementioned cardiomyopathy and pradaxa was started. About a month ago attempted cardioversion failed x3. Intercurrent evaluation has included an echo that demonstrated left atrial dimension of 51 and left ventricular function moderately-severely depressed. His quantitated ejection fraction of 25% by Myoview and a cardiac catheterization demonstrated diffuse nonobstructive disease. Thromboembolic risk factors notable for hypertension left ventricular dysfunction and age. Electrocardiogram demonstrated a nonspecific IVCD and a QRS duration of about 120 ms. He was evaluated and deemed an appropriate candidate  for dual chamber ICD implantation by Dr. Graciela Husbands. A dual chamber ICD was implanted on 09/08/2011 by Dr. Graciela Husbands (Medtronic [id number] device, serial #[id number], Medtronic [phone] single coil defibrillator lead, serial [id number] and a Medtronic 5076 52 cm active fixation, atrial lead, serial #[id number]). Please see operative note for full details. [name] tolerated the procedure well and there were no immediate complications. He has remained hemodynamically stable. He is afebrile. His chest x-ray shows no PTX. His device interrogation by industry post-implant shows stable lead parameters. The implant site on left chest is intact without evidence of hematoma or ecchymosis. Wound care instructions were reviewed with the patient. He has been seen, examined and deemed stable for discharge this morning by Dr. Johney Frame. Wound care instructions were reviewed with the patient.  Discharge Vitals: Blood pressure 121/84, pulse 60, temperature 98.1 F (36.7 C), temperature source Oral, resp. rate 18, height 5\' 6"  (1.676 m), weight 217 lb 9.5 oz (98.7 kg), SpO2 93.00%.    Discharge Medications   Medication List  As of 09/09/2011  8:26 AM     amiodarone 200 MG tablet   Commonly known as: PACERONE   Take 400 mg by mouth daily.      amLODipine 5 MG tablet   Commonly known as: NORVASC   Take 5 mg by mouth daily.      atorvastatin 40 MG tablet   Commonly known as: LIPITOR   Take 40 mg by mouth daily.      carvedilol 25 MG tablet   Commonly known as: COREG   Take 25 mg by mouth 2 (two) times daily with a meal.      CO Q-10 PLUS PO   Take 1  tablet by mouth daily.      dabigatran 150 MG Caps   Commonly known as: PRADAXA   Take 150 mg by mouth 2 (two) times daily.      fish oil-omega-3 fatty acids 1000 MG capsule   Take 2 g by mouth 3 (three) times daily.      furosemide 20 MG tablet   Commonly known as: LASIX   Take 20 mg by mouth 2 (two) times daily.      furosemide 20 MG tablet   Commonly known as:  LASIX   Take 20 mg by mouth daily.      Glucosamine HCl 1500 MG Tabs   Take by mouth daily.      omeprazole 20 MG capsule   Commonly known as: PRILOSEC   Take 20 mg by mouth daily as needed. Acid reflux.      potassium chloride SA 20 MEQ tablet   Commonly known as: K-DUR,KLOR-CON   Take 20 mEq by mouth daily.      spironolactone 12.5 mg Tabs   Commonly known as: ALDACTONE   Take 0.5 tablets (12.5 mg total) by mouth daily.        Disposition   The patient will be discharged in stable condition to home.  Discharge Orders    Future Appointments: Provider: Department: Dept Phone: Center:   09/19/2011 12:00 PM [facility] [phone] [facility]   12/22/2011 2:00 PM Duke Salvia, MD [facility] [phone] [facility]       Diet - low sodium heart healthy      Increase activity slowly      Discharge instructions      Comments:   Please see post-ICD implantation instructions   Discharge wound care:      Comments:   Please see post-ICD implantation instructions      Duration of Discharge Encounter: Greater than 30 minutes including physician and PA time.  Signed, EDMISTEN, BROOKE O. PA-C 09/09/2011, 8:26 AM   I have seen, examined the patient, and reviewed the above assessment and plan.   Co Sign: Hillis Range, MD 09/11/2011 5:02 PM

## 2011-09-09 NOTE — Progress Notes (Signed)
Covering Clinical Child psychotherapist (CSW) received a referral for advance directives. CSW observed pt was being seen by medical staff and family was asking questions. CSW unable to complete assessment however will return at a later time.  Theresia Bough, MSW, Theresia Majors [phone]

## 2011-09-09 NOTE — Discharge Instructions (Signed)
   Supplemental Discharge Instructions for  Pacemaker/Defibrillator Patients  Activity No heavy lifting or vigorous activity with your left/right arm for 6 to 8 weeks.  Do not raise your left/right arm above your head for one week.  Gradually raise your affected arm as drawn below.           05/05                      05/06                       05/07                       05/08       NO DRIVING for 1 week; you may begin driving on 16/02/9603. WOUND CARE   Keep the wound area clean and dry.  Do not get this area wet for one week. No showers for one week; you may shower on 09/16/2011.   The tape/steri-strips on your wound will fall off; do not pull them off.  No bandage is needed on the site.  DO  NOT apply any creams, oils, or ointments to the wound area.   If you notice any drainage or discharge from the wound, any swelling or bruising at the site, or you develop a fever > 101? F after you are discharged home, call the office at once.  Special Instructions   You are still able to use cellular telephones; use the ear opposite the side where you have your pacemaker/defibrillator.  Avoid carrying your cellular phone near your device.   When traveling through airports, show security personnel your identification card to avoid being screened in the metal detectors.  Ask the security personnel to use the hand wand.   Avoid arc welding equipment, MRI testing (magnetic resonance imaging), TENS units (transcutaneous nerve stimulators).  Call the office for questions about other devices.   Avoid electrical appliances that are in poor condition or are not properly grounded.   Microwave ovens are safe to be near or to operate.  Additional information for defibrillator patients should your device go off:   If your device goes off ONCE and you feel fine afterward, notify the device clinic nurses.   If your device goes off ONCE and you do not feel well afterward, call 911.   If your device goes off  TWICE, call 911.   If your device goes off THREE times in one day, call 911.  DO NOT DRIVE YOURSELF OR A FAMILY MEMBER WITH A DEFIBRILLATOR TO THE HOSPITAL--CALL 911.

## 2011-09-09 NOTE — Progress Notes (Signed)
   CARE MANAGEMENT NOTE 09/09/2011  Patient:  Chris Price, Chris Price   Account Number:  [id number]  Date Initiated:  09/09/2011  Documentation initiated by:  GRAVES-BIGELOW,Arnet Hofferber  Subjective/Objective Assessment:   Pt admitted for dual chamber ICD implantation. Plan for d/c today on pradaxa. Pt was taking pradaxa prior to admission. No furter needs from CM at this time.     Action/Plan:   Anticipated DC Date:  09/09/2011   Anticipated DC Plan:  HOME/SELF CARE      DC Planning Services  CM consult      Choice offered to / List presented to:             Status of service:  Completed, signed off Medicare Important Message given?   (If response is "NO", the following Medicare IM given date fields will be blank) Date Medicare IM given:   Date Additional Medicare IM given:    Discharge Disposition:  HOME/SELF CARE  Per UR Regulation:    If discussed at Long Length of Stay Meetings, dates discussed:    Comments:

## 2011-09-09 NOTE — Progress Notes (Addendum)
SUBJECTIVE: The patient is doing well today.  At this time, he denies chest pain, shortness of breath, or any new concerns.     Marland Kitchen amiodarone  400 mg Oral Daily  . amLODipine  5 mg Oral Daily  . atorvastatin  40 mg Oral q1800  . carvedilol  25 mg Oral BID WC  .  ceFAZolin (ANCEF) IV  1 g Intravenous Q6H  . dabigatran  150 mg Oral BID  . fentaNYL      . furosemide  20 mg Oral Daily  . lidocaine      . lidocaine      . midazolam      . mupirocin ointment      . pantoprazole  40 mg Oral Q1200  . potassium chloride SA  20 mEq Oral Daily  . spironolactone  12.5 mg Oral Daily  . DISCONTD:  ceFAZolin (ANCEF) IV  2 g Intravenous On Call  . DISCONTD: chlorhexidine  60 mL Topical Once  . DISCONTD: furosemide  20 mg Oral BID  . DISCONTD: gentamicin irrigation  80 mg Irrigation On Call  . DISCONTD: mupirocin ointment   Nasal BID  . DISCONTD: sodium chloride  3 mL Intravenous Q12H      . sodium chloride 50 mL/hr at 09/08/11 1201  . DISCONTD: sodium chloride 1,000 mL (09/08/11 0838)  . DISCONTD: sodium chloride      OBJECTIVE: Physical Exam: Filed Vitals:   09/09/11 0400 09/09/11 0500 09/09/11 0540 09/09/11 0617  BP: 108/66 100/71    Pulse: 59 59 60   Temp:  97.9 F (36.6 C)    TempSrc:  Oral    Resp:      Height:      Weight:    217 lb 9.5 oz (98.7 kg)  SpO2: 92% 94% 95%     Intake/Output Summary (Last 24 hours) at 09/09/11 0741 Last data filed at 09/09/11 0648  Gross per 24 hour  Intake 1546.67 ml  Output   1725 ml  Net -178.33 ml    Telemetry reveals a paced, some afib overnight  GEN- The patient is well appearing, alert and oriented x 3 today.   Head- normocephalic, atraumatic Eyes-  Sclera clear, conjunctiva pink Ears- hearing intact Oropharynx- clear Neck- supple, no JVP Lymph- no cervical lymphadenopathy Lungs- Clear to ausculation bilaterally, normal work of breathing Heart- Regular rate and rhythm, no murmurs, rubs or gallops, PMI not laterally  displaced GI- soft, NT, ND, + BS Extremities- no clubbing, cyanosis, or edema ICD site without hematoma  LABS: Basic Metabolic Panel: No results found for this basename: NA:2,K:2,CL:2,CO2:2,GLUCOSE:2,BUN:2,CREATININE:2,CALCIUM:2,MG:2,PHOS:2 in the last 72 hours Liver Function Tests: No results found for this basename: AST:2,ALT:2,ALKPHOS:2,BILITOT:2,PROT:2,ALBUMIN:2 in the last 72 hours No results found for this basename: LIPASE:2,AMYLASE:2 in the last 72 hours CBC:  Basename 09/08/11 0831  WBC 6.3  NEUTROABS --  HGB 15.8  HCT 44.7  MCV 91.2  PLT 201   ASSESSMENT AND PLAN:  Active Problems:  * No active hospital problems. *   1. Chronic systolic dysfunction- doing well s/p ICD Wound check in 10 days, follow-up with Dr Graciela Husbands in [place] in 3 months CXR without obvious ptx ICD interrogation today is reviewed and normal Resume home medicines  2. Afib- resume pradaxa   DC to home today   Hillis Range, MD 09/09/2011 7:41 AM

## 2011-09-19 ENCOUNTER — Encounter: Payer: Self-pay | Admitting: Internal Medicine

## 2011-09-19 ENCOUNTER — Ambulatory Visit (INDEPENDENT_AMBULATORY_CARE_PROVIDER_SITE_OTHER): Payer: Medicare PPO | Admitting: *Deleted

## 2011-09-19 DIAGNOSIS — I5022 Chronic systolic (congestive) heart failure: Secondary | ICD-10-CM

## 2011-09-19 DIAGNOSIS — Z95810 Presence of automatic (implantable) cardiac defibrillator: Secondary | ICD-10-CM

## 2011-09-19 DIAGNOSIS — I4891 Unspecified atrial fibrillation: Secondary | ICD-10-CM

## 2011-09-19 DIAGNOSIS — I428 Other cardiomyopathies: Secondary | ICD-10-CM

## 2011-09-19 LAB — ICD DEVICE OBSERVATION
AL AMPLITUDE: 2 mv
AL THRESHOLD: 1.625 V
ATRIAL PACING ICD: 71 pct
DEV-0020ICD: NEGATIVE
HV IMPEDENCE: 62 Ohm
RV LEAD AMPLITUDE: 6.9 mv
RV LEAD THRESHOLD: 0.5 V
TZON-0003FASTVT: 300 ms

## 2011-09-19 NOTE — Progress Notes (Signed)
Pt seen in clinic for wound check of ICD.  No complaints of chest pain, shortness of breath, dizziness, palpitations, or shocks.  Pt would like to establish in [facility] for primary cardiology care.  Appt made through [facility]  Device functioning normally at this time.  For full details, see PaceArt report.  Rate response turned on today 2/2 A pacing of 71%.  Plan to follow up in 3 months with Graciela Husbands.  Gypsy Balsam, RN, BSN 09/19/2011 12:23 PM

## 2011-09-20 ENCOUNTER — Ambulatory Visit (INDEPENDENT_AMBULATORY_CARE_PROVIDER_SITE_OTHER): Payer: Medicare PPO | Admitting: Internal Medicine

## 2011-09-20 ENCOUNTER — Encounter: Payer: Self-pay | Admitting: Internal Medicine

## 2011-09-20 VITALS — BP 112/74 | HR 90 | Temp 97.9°F | Resp 16 | Ht 66.0 in | Wt 217.8 lb

## 2011-09-20 DIAGNOSIS — I428 Other cardiomyopathies: Secondary | ICD-10-CM

## 2011-09-20 DIAGNOSIS — I4891 Unspecified atrial fibrillation: Secondary | ICD-10-CM

## 2011-09-20 DIAGNOSIS — H9190 Unspecified hearing loss, unspecified ear: Secondary | ICD-10-CM

## 2011-09-20 DIAGNOSIS — R972 Elevated prostate specific antigen [PSA]: Secondary | ICD-10-CM

## 2011-09-20 DIAGNOSIS — H9191 Unspecified hearing loss, right ear: Secondary | ICD-10-CM

## 2011-09-20 MED ORDER — DABIGATRAN ETEXILATE MESYLATE 150 MG PO CAPS: 150.0000 mg | ORAL_CAPSULE | Freq: Two times a day (BID) | ORAL | Status: DC

## 2011-09-20 NOTE — Assessment & Plan Note (Addendum)
Currently rate controlled,  Now with pacer for history of bradycardia. Samples of Pradaxa and savings card given.,

## 2011-09-20 NOTE — Progress Notes (Signed)
Patient ID: Chris Price, male   DOB: 11-Sep-1940, 71 y.o.   MRN: [id number]  Patient Active Problem List  Diagnoses  . Atrial fibrillation  . IVCD (intraventricular conduction defect)  . Cardiomyopathy -nonischemic  . Chronic systolic heart failure  . ICD -[employer]onic  . Hearing loss in right ear  . Elevated PSA, less than 10 ng/ml    Subjective:  CC:   Chief Complaint  Patient presents with  . New Patient    HPI:   Chris Granier O'Brienis a 71 y.o. male who presents  To establish primary care.  Referred by Wonda Cheng of [facility], his former. PCP .  He is a 71 yr old white male with a history of ischemic cardiomyopathy  Who moved to [facility] in 2002 from [place], originally from  [place]. 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  Travelled a lot .,  Quality control. No occupational exposures. Recently hospitalized at [facility] for new onset CHF April 22 under [facility].  Discharged home  April 25,  AICD placed 2 weeks ago by Sherryl Manges for severe systolic dysfunction on May 2,  And has new patient app with Kirke Corin this Friday.  Weight has been stable at home at 212- 213.  Has been taking 40 mg of furosemide and 12. 5mg  spironolactone daily . On oral potassium with surveillance labs since discharge done two weeks ago,  Then last week by [facility].  Was also referred to urology for elevated PSA of  4. 75  And is scheduled to see Dr. Achilles Dunk next month for prostate biopsy.  All in all ,  He feels pretty good today.  Eager to be able to return to golfing .  CC today is loss of hearing in right ear, which he recently noticed since he was told not to use his left ear for cell phone becuase of the pacer/AICD.  History of acquired hearing loss probably [place] in [place] with use of large caliber rifles. No recent audiology testing.    Past Medical History  Diagnosis Date  . Lumbar spinal stenosis   . Degenerative disk disease     lumbar  . Disc herniation   . Hyperlipidemia   .  Cardiomyopathy -nonischemic     moderate to severe EF 25-30% March 2013  . Atrial fibrillation     obstructive; on CPAP  . Hypertension   . Coronary artery disease 08/15/2011    insignificant CAD with 60% mid LAD, 60% proximal RCA and 30 % stenossis left circumflex.  . IVCD (intraventricular conduction defect)     Nonspecific QRS duration 122  . Chronic systolic heart failure 08/25/2011  . ICD (implantable cardiac defibrillator) in place   . CHF (congestive heart failure)   . Shortness of breath   . Sleep apnea     uses cpap  . GERD (gastroesophageal reflux disease)   . Neuromuscular disorder     neauropathy   / siatica    Past Surgical History  Procedure Date  . Lumbar laminectomy     L2 through S1 with wide decompression of the thecal sac and nerve roots.  . [facility]version 08/2011    [facility]  . Cardiac catheterization 08/15/2011    [facility]   . Hernia repair   . Icd 09/08/2011  . Back surgery   . Cooled thermotherapy 2007    [facility], complicated by incontinence         The following portions of the patient's history were reviewed and updated as  appropriate: Allergies, current medications, and problem list.    Review of Systems:   12 Pt  review of systems was negative except those addressed in the HPI,     History   Social History  . Marital Status: Married    Spouse Name: N/A    Number of Children: N/A  . Years of Education: N/A   Occupational History  . Not on file.   Social History Main Topics  . Smoking status: Never Smoker   . Smokeless tobacco: Never Used  . Alcohol Use: No  . Drug Use: No  . Sexually Active: Not Currently   Other Topics Concern  . Not on file   Social History Narrative  . No narrative on file    Objective:  BP 112/74  Pulse 90  Temp(Src) 97.9 F (36.6 C) (Oral)  Resp 16  Ht 5\' 6"  (1.676 m)  Wt 217 lb 12 oz (98.771 kg)  BMI 35.15 kg/m2  SpO2 96%  General appearance: alert, cooperative and appears stated age Ears: normal  TM's and external ear canals both ears Throat: lips, mucosa, and tongue normal; teeth and gums normal Neck: no adenopathy, no carotid bruit, supple, symmetrical, trachea midline and thyroid not enlarged, symmetric, no tenderness/mass/nodules Back: symmetric, no curvature. ROM normal. No CVA tenderness. Lungs: clear to auscultation bilaterally Heart: regular rate and rhythm, S1, S2 normal, no murmur, click, rub or gallop Abdomen: soft, non-tender; bowel sounds normal; no masses,  no organomegaly Pulses: 2+ and symmetric Skin: Skin color, texture, turgor normal. No rashes or lesions Lymph nodes: Cervical, supraclavicular, and axillary nodes normal.  Assessment and Plan:  Hearing loss in right ear Acquired,  Needs audiology referral for eval and hearing aid.  Wants to defer until after June  Elevated PSA, less than 10 ng/ml With prior cryotherapy years ago by [facility] for BPH.  Now scheduled t see Cope for biopsy.  Knows to suspend pradaxa prior to procedure 48 hrs.   Atrial fibrillation Currently rate controlled,  Now with pacer for history of bradycardia. Samples of Pradaxa and savings card given.,   Cardiomyopathy -nonischemic moderate to severe EF 25-30% by March 2013 ECHO and prior caths showing nonocclusive 3 vessel disease. On appropriate meds.  To establich cardiology follow up with Dr. Kirke Corin.     Updated Medication List Outpatient Encounter Prescriptions as of 09/20/2011  Medication Sig Dispense Refill  . amiodarone (PACERONE) 200 MG tablet Take 400 mg by mouth daily.      Marland Kitchen amLODipine (NORVASC) 5 MG tablet Take 5 mg by mouth daily.      Marland Kitchen atorvastatin (LIPITOR) 40 MG tablet Take 40 mg by mouth daily.      . carvedilol (COREG) 25 MG tablet Take 25 mg by mouth 2 (two) times daily with a meal.      . Coenzyme Q10-Levocarnitine (CO Q-10 PLUS PO) Take 1 tablet by mouth daily.       . dabigatran (PRADAXA) 150 MG CAPS Take 1 capsule (150 mg total) by mouth 2 (two) times daily.  60  capsule  11  . fish oil-omega-3 fatty acids 1000 MG capsule Take 2 g by mouth 3 (three) times daily.      . furosemide (LASIX) 40 MG tablet Take 40 mg by mouth daily.      . Glucosamine HCl 1500 MG TABS Take by mouth daily.      Marland Kitchen omeprazole (PRILOSEC) 20 MG capsule Take 20 mg by mouth daily as needed. Acid reflux.      Marland Kitchen  potassium chloride SA (K-DUR,KLOR-CON) 20 MEQ tablet Take 20 mEq by mouth daily.      Marland Kitchen spironolactone (ALDACTONE) 12.5 mg TABS Take 0.5 tablets (12.5 mg total) by mouth daily.  30 tablet  11  . Tamsulosin HCl (FLOMAX) 0.4 MG CAPS Take 0.4 mg by mouth daily.      Marland Kitchen DISCONTD: dabigatran (PRADAXA) 150 MG CAPS Take 150 mg by mouth 2 (two) times daily.      Marland Kitchen DISCONTD: furosemide (LASIX) 20 MG tablet Take 20 mg by mouth daily.         No orders of the defined types were placed in this encounter.    Return in about 3 months (around 12/21/2011).

## 2011-09-20 NOTE — Assessment & Plan Note (Addendum)
Acquired,  Needs audiology referral for eval and hearing aid.  Wants to defer until after June

## 2011-09-20 NOTE — Assessment & Plan Note (Signed)
With prior cryotherapy years ago by [facility] for BPH.  Now scheduled t see Cope for biopsy.  Knows to suspend pradaxa prior to procedure 48 hrs.

## 2011-09-20 NOTE — Assessment & Plan Note (Addendum)
moderate to severe EF 25-30% by March 2013 ECHO and prior caths showing nonocclusive 3 vessel disease. On appropriate meds.  To establich cardiology follow up with Dr. Kirke Corin.

## 2011-09-21 ENCOUNTER — Encounter: Payer: Self-pay | Admitting: Cardiovascular Disease

## 2011-09-23 ENCOUNTER — Encounter: Payer: Self-pay | Admitting: Cardiovascular Disease

## 2011-09-23 ENCOUNTER — Ambulatory Visit (INDEPENDENT_AMBULATORY_CARE_PROVIDER_SITE_OTHER): Payer: Medicare PPO | Admitting: Cardiovascular Disease

## 2011-09-23 VITALS — BP 120/82 | HR 68 | Ht 66.0 in | Wt 219.0 lb

## 2011-09-23 DIAGNOSIS — I2510 Atherosclerotic heart disease of native coronary artery without angina pectoris: Secondary | ICD-10-CM

## 2011-09-23 DIAGNOSIS — R972 Elevated prostate specific antigen [PSA]: Secondary | ICD-10-CM

## 2011-09-23 DIAGNOSIS — I10 Essential (primary) hypertension: Secondary | ICD-10-CM | POA: Insufficient documentation

## 2011-09-23 DIAGNOSIS — I5022 Chronic systolic (congestive) heart failure: Secondary | ICD-10-CM

## 2011-09-23 DIAGNOSIS — I4891 Unspecified atrial fibrillation: Secondary | ICD-10-CM

## 2011-09-23 MED ORDER — LISINOPRIL 5 MG PO TABS: 5.0000 mg | ORAL_TABLET | Freq: Every day | ORAL | Status: DC

## 2011-09-23 MED ORDER — AMLODIPINE BESYLATE 5 MG PO TABS: 5.0000 mg | ORAL_TABLET | Freq: Every day | ORAL | Status: DC

## 2011-09-23 NOTE — Assessment & Plan Note (Signed)
The patient has moderate nonobstructive coronary artery disease. Aggressive medical therapy is recommended. He is not having symptoms suggestive of angina.   

## 2011-09-23 NOTE — Assessment & Plan Note (Signed)
The patient informs me that he will be undergoing prostate biopsy likely next month. There is no contraindication from a cardiac standpoint but he will have to inform his urologist about the presence of an ICD device. He is also on anticoagulation with Pradaxa which should be held 2 days before the procedure and resumed one to 2 days after depending on whether there was blood loss or source of bleeding.

## 2011-09-23 NOTE — Progress Notes (Signed)
HPI  [name] is a pleasant 71 year old male who is here today to establish general cardiovascular care. His previous cardiologist was Dr. Darrold Junker. He was diagnosed with congestive heart failure with severely reduced LV systolic function this year. He was also diagnosed with atrial fibrillation that initially failed cardioversion. He was hospitalized in April of this year with heart failure exacerbation. Ejection fraction was 27%. He underwent cardiac catheterization which showed moderate nonobstructive coronary artery disease. He improved significantly with Lasix and was placed on heart failure treatment. Amiodarone was started for atrial fibrillation he was anticoagulated with Pradaxa. He was seen by Dr. Graciela Husbands who performed a dual-chamber ICD placement without complications. The patient overall has been feeling much better than before. His dyspnea has improved significantly. Orthopnea has resolved. He denies any chest pain at this time. It appears that he was on lisinopril before which was discontinued due to hypotension.  No Known Allergies   Current Outpatient Prescriptions on File Prior to Visit  Medication Sig Dispense Refill  . amiodarone (PACERONE) 200 MG tablet Take 400 mg by mouth daily.      Marland Kitchen atorvastatin (LIPITOR) 40 MG tablet Take 40 mg by mouth daily.      . carvedilol (COREG) 25 MG tablet Take 25 mg by mouth 2 (two) times daily with a meal.      . Coenzyme Q10-Levocarnitine (CO Q-10 PLUS PO) Take 1 tablet by mouth daily.       . dabigatran (PRADAXA) 150 MG CAPS Take 1 capsule (150 mg total) by mouth 2 (two) times daily.  60 capsule  11  . fish oil-omega-3 fatty acids 1000 MG capsule Take 2 g by mouth 3 (three) times daily. Taking 360 daily      . furosemide (LASIX) 40 MG tablet Take 40 mg by mouth daily.      Marland Kitchen omeprazole (PRILOSEC) 20 MG capsule Take 20 mg by mouth daily as needed. Acid reflux.      Marland Kitchen spironolactone (ALDACTONE) 12.5 mg TABS Take 0.5 tablets (12.5 mg  total) by mouth daily.  30 tablet  11  . Tamsulosin HCl (FLOMAX) 0.4 MG CAPS Take 0.4 mg by mouth daily.      Marland Kitchen DISCONTD: amLODipine (NORVASC) 5 MG tablet Take 5 mg by mouth daily.         Past Medical History  Diagnosis Date  . Lumbar spinal stenosis   . Degenerative disk disease     lumbar  . Disc herniation   . Hyperlipidemia   . Cardiomyopathy -nonischemic     moderate to severe EF 25-30% March 2013  . Chronic systolic heart failure 08/25/2011  . ICD (implantable cardiac defibrillator) in place   . CHF (congestive heart failure)   . Sleep apnea     uses cpap  . GERD (gastroesophageal reflux disease)   . Neuromuscular disorder     neauropathy   / siatica  . Coronary artery disease 08/15/2011    Moderate nonobstructive CAD with 60% mid LAD, 60% proximal RCA and 30 % stenossis left circumflex.  . Hypertension   . Atrial fibrillation   . IVCD (intraventricular conduction defect)     Nonspecific QRS duration 122     Past Surgical History  Procedure Date  . Lumbar laminectomy     L2 through S1 with wide decompression of the thecal sac and nerve roots.  . [facility] 08/2011    [facility]  . Cardiac catheterization 08/15/2011    [facility]   . Hernia repair   .  Icd 09/08/2011  . Back surgery   . Cooled thermotherapy 2007    [facility], complicated by incontinence     Family History  Problem Relation Age of Onset  . Heart attack Father   . Heart attack Brother      History   Social History  . Marital Status: Married    Spouse Name: N/A    Number of Children: N/A  . Years of Education: N/A   Occupational History  . Not on file.   Social History Main Topics  . Smoking status: Never Smoker   . Smokeless tobacco: Never Used  . Alcohol Use: No  . Drug Use: No  . Sexually Active: Not Currently   Other Topics Concern  . Not on file   Social History Narrative  . No narrative on file     ROS Constitutional: Negative for fever, chills, diaphoresis, activity change,  appetite change and fatigue.  HENT: Negative for hearing loss, nosebleeds, congestion, sore throat, facial swelling, drooling, trouble swallowing, neck pain, voice change, sinus pressure and tinnitus.  Eyes: Negative for photophobia, pain, discharge and visual disturbance.  Respiratory: Negative for apnea, cough, chest tightness and wheezing.  Cardiovascular: Negative for chest pain, palpitations and leg swelling.  Gastrointestinal: Negative for nausea, vomiting, abdominal pain, diarrhea, constipation, blood in stool and abdominal distention.  Genitourinary: Negative for dysuria, urgency, frequency, hematuria and decreased urine volume.  Musculoskeletal: Negative for myalgias, back pain, joint swelling, arthralgias and gait problem.  Skin: Negative for color change, pallor, rash and wound.  Neurological: Negative for dizziness, tremors, seizures, syncope, speech difficulty, weakness, light-headedness, numbness and headaches.  Psychiatric/Behavioral: Negative for suicidal ideas, hallucinations, behavioral problems and agitation. The patient is not nervous/anxious.     PHYSICAL EXAM   BP 120/82  Pulse 68  Ht 5\' 6"  (1.676 m)  Wt 219 lb (99.338 kg)  BMI 35.35 kg/m2 Constitutional: He is oriented to person, place, and time. He appears well-developed and well-nourished. No distress.  HENT: No nasal discharge.  Head: Normocephalic and atraumatic.  Eyes: Pupils are equal and round. Right eye exhibits no discharge. Left eye exhibits no discharge.  Neck: Normal range of motion. Neck supple. No JVD present. No thyromegaly present.  Cardiovascular: Normal rate, regular rhythm, normal heart sounds and. Exam reveals no gallop and no friction rub. No murmur heard.  Pulmonary/Chest: Effort normal and breath sounds normal. No stridor. No respiratory distress. He has no wheezes. He has no rales. He exhibits no tenderness.  Abdominal: Soft. Bowel sounds are normal. He exhibits no distension. There is no  tenderness. There is no rebound and no guarding.  Musculoskeletal: Normal range of motion. He exhibits trace edema and no tenderness.  Neurological: He is alert and oriented to person, place, and time. Coordination normal.  Skin: Skin is warm and dry. No rash noted. He is not diaphoretic. No erythema. No pallor.  Psychiatric: He has a normal mood and affect. His behavior is normal. Judgment and thought content normal.       EKG: Normal sinus rhythm with nonspecific IVCD. QRS duration is 130 ms. Normal PR and QT interval.   ASSESSMENT AND PLAN

## 2011-09-23 NOTE — Patient Instructions (Signed)
Stop Potassium Start Lisinopril 5 mg once daily (sent to [facility]).  You need labs in 1 week  Follow up 6 weeks.

## 2011-09-23 NOTE — Assessment & Plan Note (Signed)
The patient appears to be significantly better. He is currently in [place] Heart Association class 2-3. His fluid overload has improved significantly. He is not on an ACE inhibitor due to hypotension. His blood pressure has stabilized since then. Due to that, I recommend starting lisinopril 5 mg once daily. I will request basic metabolic profile in one week. I think ultimately I would be maximizing the dose of lisinopril while discontinuing amlodipine. We have to make sure first that he is not developing hyperkalemia with a combination of ACE inhibitors and spironolactone.

## 2011-09-23 NOTE — Assessment & Plan Note (Signed)
He is currently in normal sinus rhythm on amiodarone 400 mg once daily. We can probably decrease the dose to 200 mg once daily in the near future. We might also consider switching to a different antiarrhythmic medication if his LV systolic function improves. He is on anticoagulation with Pradaxa without reported side effects.

## 2011-09-30 ENCOUNTER — Other Ambulatory Visit: Payer: Medicare PPO

## 2011-09-30 DIAGNOSIS — I4891 Unspecified atrial fibrillation: Secondary | ICD-10-CM

## 2011-10-01 LAB — BASIC METABOLIC PANEL
BUN/Creatinine Ratio: 22 (ref 10–22)
BUN: 24 mg/dL (ref 8–27)
CO2: 20 mmol/L (ref 20–32)
Calcium: 9 mg/dL (ref 8.6–10.2)
Chloride: 103 mmol/L (ref 97–108)
Creatinine, Ser: 1.09 mg/dL (ref 0.76–1.27)
GFR calc Af Amer: 78 mL/min/{1.73_m2} (ref >59)
GFR calc non Af Amer: 67 mL/min/{1.73_m2} (ref >59)
Glucose: 130 mg/dL — ABNORMAL HIGH (ref 65–99)
Potassium: 4.3 mmol/L (ref 3.5–5.2)
Sodium: 139 mmol/L (ref 134–144)

## 2011-10-24 ENCOUNTER — Other Ambulatory Visit: Payer: Self-pay | Admitting: Cardiovascular Disease

## 2011-10-24 DIAGNOSIS — I4891 Unspecified atrial fibrillation: Secondary | ICD-10-CM

## 2011-10-24 DIAGNOSIS — R0602 Shortness of breath: Secondary | ICD-10-CM

## 2011-10-24 MED ORDER — ATORVASTATIN CALCIUM 40 MG PO TABS: 40.0000 mg | ORAL_TABLET | Freq: Every day | ORAL | Status: DC

## 2011-10-24 MED ORDER — LISINOPRIL 5 MG PO TABS: 5.0000 mg | ORAL_TABLET | Freq: Every day | ORAL | Status: DC

## 2011-10-24 MED ORDER — FUROSEMIDE 40 MG PO TABS: 40.0000 mg | ORAL_TABLET | Freq: Every day | ORAL | Status: DC

## 2011-10-24 MED ORDER — AMLODIPINE BESYLATE 5 MG PO TABS: 5.0000 mg | ORAL_TABLET | Freq: Every day | ORAL | Status: DC

## 2011-10-24 MED ORDER — CARVEDILOL 25 MG PO TABS: 25.0000 mg | ORAL_TABLET | Freq: Two times a day (BID) | ORAL | Status: DC

## 2011-10-24 MED ORDER — OMEPRAZOLE 20 MG PO CPDR: 20.0000 mg | DELAYED_RELEASE_CAPSULE | Freq: Every day | ORAL | Status: DC | PRN

## 2011-10-24 MED ORDER — DABIGATRAN ETEXILATE MESYLATE 150 MG PO CAPS: 150.0000 mg | ORAL_CAPSULE | Freq: Two times a day (BID) | ORAL | Status: DC

## 2011-10-24 MED ORDER — SPIRONOLACTONE 12.5 MG HALF TABLET: 12.5000 mg | ORAL_TABLET | Freq: Every day | ORAL | Status: DC

## 2011-10-24 MED ORDER — AMIODARONE HCL 200 MG PO TABS: 400.0000 mg | ORAL_TABLET | Freq: Every day | ORAL | Status: DC

## 2011-10-24 NOTE — Telephone Encounter (Signed)
Refilled amiodarone, amlodipine, atorvastatin, carvedilol, pradaxa, lasix, prilosec, aldactone and lisinopril.

## 2011-10-25 ENCOUNTER — Other Ambulatory Visit: Payer: Self-pay | Admitting: Cardiovascular Disease

## 2011-10-25 MED ORDER — FUROSEMIDE 40 MG PO TABS: 40.0000 mg | ORAL_TABLET | Freq: Every day | ORAL | Status: DC

## 2011-10-25 NOTE — Telephone Encounter (Signed)
Refilled Furosemide. 

## 2011-10-25 NOTE — Telephone Encounter (Signed)
Pt wants to get a refill from [facility] for 1 month.

## 2011-10-25 NOTE — Telephone Encounter (Signed)
Pt to have a biopsy on prostate and needs to know when to spot pradaxa.

## 2011-10-26 ENCOUNTER — Telehealth: Payer: Self-pay

## 2011-10-26 NOTE — Telephone Encounter (Signed)
Chris Price 10/25/2011 11:31 AM Signed  Pt to have a biopsy on prostate and needs to know when to spot pradaxa.   I spoke with pt's wife who says pt to have prostate bx 6/27. Was told by Dr.Cope he needs to hold Pradaxa prior to surg.  Wife asks how long he should hold this. I explained ok to hold x 48 hours then resume.  Understanding verb.

## 2011-11-07 ENCOUNTER — Ambulatory Visit (INDEPENDENT_AMBULATORY_CARE_PROVIDER_SITE_OTHER): Payer: Medicare PPO | Admitting: Cardiovascular Disease

## 2011-11-07 ENCOUNTER — Encounter: Payer: Self-pay | Admitting: Cardiovascular Disease

## 2011-11-07 VITALS — BP 108/72 | HR 61 | Ht 66.0 in | Wt 216.0 lb

## 2011-11-07 DIAGNOSIS — I5022 Chronic systolic (congestive) heart failure: Secondary | ICD-10-CM

## 2011-11-07 DIAGNOSIS — I4819 Other persistent atrial fibrillation: Secondary | ICD-10-CM

## 2011-11-07 DIAGNOSIS — I4891 Unspecified atrial fibrillation: Secondary | ICD-10-CM

## 2011-11-07 DIAGNOSIS — I2510 Atherosclerotic heart disease of native coronary artery without angina pectoris: Secondary | ICD-10-CM

## 2011-11-07 DIAGNOSIS — I10 Essential (primary) hypertension: Secondary | ICD-10-CM

## 2011-11-07 HISTORY — DX: Other persistent atrial fibrillation: I48.19

## 2011-11-07 MED ORDER — LISINOPRIL 5 MG PO TABS: 20.0000 mg | ORAL_TABLET | Freq: Every day | ORAL | Status: DC

## 2011-11-07 MED ORDER — LISINOPRIL 20 MG PO TABS: 20.0000 mg | ORAL_TABLET | Freq: Every day | ORAL | Status: DC

## 2011-11-07 MED ORDER — AMIODARONE HCL 200 MG PO TABS: 200.0000 mg | ORAL_TABLET | Freq: Every day | ORAL | Status: DC

## 2011-11-07 NOTE — Assessment & Plan Note (Signed)
The patient has moderate nonobstructive coronary artery disease. Aggressive medical therapy is recommended. He is not having symptoms suggestive of angina.

## 2011-11-07 NOTE — Progress Notes (Signed)
HPI  Chris Price is a pleasant 71 year old male who is here today for a followup visit. He was diagnosed with congestive heart failure with severely reduced LV systolic function this year. He was also diagnosed with atrial fibrillation that initially failed cardioversion. He was hospitalized in April of this year with heart failure exacerbation. Ejection fraction was 27%. He underwent cardiac catheterization which showed moderate nonobstructive coronary artery disease. He improved significantly with Lasix and was placed on heart failure treatment. Amiodarone was started for atrial fibrillation he was anticoagulated with Pradaxa. He was seen by Dr. Graciela Husbands who performed a dual-chamber ICD placement without complications.  During his last visit, I started him on small dose lisinopril. He has been tolerating the medication without any reported side effects. He underwent prostate biopsy. Pradaxa was held 2 days before the procedure and resumed 2 days after. However, he noticed blood in the urine.  No Known Allergies   Current Outpatient Prescriptions on File Prior to Visit  Medication Sig Dispense Refill  . atorvastatin (LIPITOR) 40 MG tablet Take 1 tablet (40 mg total) by mouth daily.  30 tablet  5  . carvedilol (COREG) 25 MG tablet Take 1 tablet (25 mg total) by mouth 2 (two) times daily with a meal.  60 tablet  5  . Coenzyme Q10-Levocarnitine (CO Q-10 PLUS PO) Take 1 tablet by mouth daily.       . dabigatran (PRADAXA) 150 MG CAPS Take 1 capsule (150 mg total) by mouth 2 (two) times daily.  60 capsule  5  . fish oil-omega-3 fatty acids 1000 MG capsule Take 2 g by mouth 3 (three) times daily. Taking 360 daily      . furosemide (LASIX) 40 MG tablet Take 1 tablet (40 mg total) by mouth daily.  30 tablet  1  . omeprazole (PRILOSEC) 20 MG capsule Take 1 capsule (20 mg total) by mouth daily as needed. Acid reflux.  30 capsule  5  . spironolactone (ALDACTONE) 12.5 mg TABS Take 0.5 tablets (12.5 mg total)  by mouth daily.  30 tablet  5  . Tamsulosin HCl (FLOMAX) 0.4 MG CAPS Take 0.4 mg by mouth daily.         Past Medical History  Diagnosis Date  . Lumbar spinal stenosis   . Degenerative disk disease     lumbar  . Disc herniation   . Hyperlipidemia   . Cardiomyopathy -nonischemic     moderate to severe EF 25-30% March 2013  . Chronic systolic heart failure 08/25/2011  . ICD (implantable cardiac defibrillator) in place   . CHF (congestive heart failure)   . Sleep apnea     uses cpap  . GERD (gastroesophageal reflux disease)   . Neuromuscular disorder     neauropathy   / siatica  . Coronary artery disease 08/15/2011    Moderate nonobstructive CAD with 60% mid LAD, 60% proximal RCA and 30 % stenossis left circumflex.  . Hypertension   . Atrial fibrillation   . IVCD (intraventricular conduction defect)     Nonspecific QRS duration 122     Past Surgical History  Procedure Date  . Lumbar laminectomy     L2 through S1 with wide decompression of the thecal sac and nerve roots.  . [facility] 08/2011    [facility]  . Cardiac [facility]/15/2011    [facility]   . Hernia repair   . Icd 09/08/2011  . Back surgery   . Cooled thermotherapy 2007    [facility]ock,  complicated by incontinence  . Prostate biopsy 2013     Family History  Problem Relation Age of Onset  . Heart attack Father   . Heart attack Brother      History   Social History  . Marital Status: Married    Spouse Name: N/A    Number of Children: N/A  . Years of Education: N/A   Occupational History  . Not on file.   Social History Main Topics  . Smoking status: Never Smoker   . Smokeless tobacco: Never Used  . Alcohol Use: No  . Drug Use: No  . Sexually Active: Not Currently   Other Topics Concern  . Not on file   Social History Narrative  . No narrative on file     PHYSICAL EXAM   BP 108/72  Pulse 61  Ht 5\' 6"  (1.676 m)  Wt 216 lb (97.977 kg)  BMI 34.86 kg/m2 Constitutional: He is oriented to  person, place, and time. He appears well-developed and well-nourished. No distress.  HENT: No nasal discharge.  Head: Normocephalic and atraumatic.  Eyes: Pupils are equal and round. Right eye exhibits no discharge. Left eye exhibits no discharge.  Neck: Normal range of motion. Neck supple. No JVD present. No thyromegaly present.  Cardiovascular: Normal rate, regular rhythm, normal heart sounds. Exam reveals no gallop and no friction rub. No murmur heard.  Pulmonary/Chest: Effort normal and breath sounds normal. No stridor. No respiratory distress. He has no wheezes. He has no rales. He exhibits no tenderness.  Abdominal: Soft. Bowel sounds are normal. He exhibits no distension. There is no tenderness. There is no rebound and no guarding.  Musculoskeletal: Normal range of motion. He exhibits no edema and no tenderness.  Neurological: He is alert and oriented to person, place, and time. Coordination normal.  Skin: Skin is warm and dry. No rash noted. He is not diaphoretic. No erythema. No pallor.  Psychiatric: He has a normal mood and affect. His behavior is normal. Judgment and thought content normal.       EKG: His chronic atrial pacemaker, left bundle branch block.   ASSESSMENT AND PLAN

## 2011-11-07 NOTE — Patient Instructions (Addendum)
Do not take Pradaxa for another 3 days (take Aspirin 81 mg once daily until then).  Stop Amlodipine.  Increase Lisinopril to 20 mg once daily.  Decrease Amiodarone to 200 mg once daily.  Follow up in 3 months.

## 2011-11-07 NOTE — Assessment & Plan Note (Signed)
He is currently in normal sinus rhythm on amiodarone 400 mg once daily. I will decrease the dose of amiodarone to 200 mg once daily. He will need followup liver and thyroid panel twice a year. He is reporting some hematuria after recent prostate biopsy. I asked him to hold Pradaxa for another 3 days. He can take aspirin 81 mg once daily during this period.

## 2011-11-07 NOTE — Assessment & Plan Note (Signed)
The patient appears to be significantly better. He is currently in [place] Heart Association class 2 . His fluid overload has improved significantly. I recommend increasing the dose of lisinopril to 20 mg once daily. I will stop amlodipine. Continue other medications.

## 2011-11-15 ENCOUNTER — Telehealth: Payer: Self-pay | Admitting: Internal Medicine

## 2011-11-15 NOTE — Telephone Encounter (Signed)
Initial setup with [employer] monitor done.  Patient aware.

## 2011-11-15 NOTE — Telephone Encounter (Signed)
New msg Pt's wife wants to talk to you about monitor please call

## 2011-12-03 ENCOUNTER — Emergency Department: Payer: Self-pay | Admitting: Emergency Medicine

## 2011-12-03 LAB — URINALYSIS, COMPLETE
Bilirubin,UR: NEGATIVE
Hyaline Cast: 1
Ketone: NEGATIVE
Leukocyte Esterase: NEGATIVE
Protein: NEGATIVE
RBC,UR: 1 /HPF (ref 0–5)
Specific Gravity: 1.003 (ref 1.003–1.030)
Squamous Epithelial: NONE SEEN
WBC UR: <1 /HPF (ref 0–5)

## 2011-12-05 LAB — URINE CULTURE

## 2011-12-06 ENCOUNTER — Telehealth: Payer: Self-pay | Admitting: Cardiovascular Disease

## 2011-12-06 NOTE — Telephone Encounter (Signed)
Pt wife called stating that pt has a catheter due to some prostate issues and says that it seems the bag is filling up a lot. Wonders if he needs to cut one of his fluid pills out. Pt is to go on a 5 hr flight tomorrow morning.

## 2011-12-06 NOTE — Telephone Encounter (Signed)
Please see below and advise (Wasn't sure if ok to tell pt to decrease diuretics since he will be sitting for 5+ hours on plane tomm??) Let me know what you think Thanks

## 2011-12-06 NOTE — Telephone Encounter (Signed)
Decrease lasix to 20 mg daily.  

## 2011-12-06 NOTE — Telephone Encounter (Signed)
pts wife informed Understanding verb 

## 2011-12-22 ENCOUNTER — Ambulatory Visit (INDEPENDENT_AMBULATORY_CARE_PROVIDER_SITE_OTHER): Payer: Medicare PPO | Admitting: Internal Medicine

## 2011-12-22 ENCOUNTER — Encounter: Payer: Self-pay | Admitting: Internal Medicine

## 2011-12-22 DIAGNOSIS — I5022 Chronic systolic (congestive) heart failure: Secondary | ICD-10-CM

## 2011-12-22 DIAGNOSIS — I455 Other specified heart block: Secondary | ICD-10-CM

## 2011-12-22 DIAGNOSIS — I454 Nonspecific intraventricular block: Secondary | ICD-10-CM

## 2011-12-22 DIAGNOSIS — G250 Essential tremor: Secondary | ICD-10-CM | POA: Insufficient documentation

## 2011-12-22 DIAGNOSIS — I428 Other cardiomyopathies: Secondary | ICD-10-CM

## 2011-12-22 LAB — ICD DEVICE OBSERVATION
AL AMPLITUDE: 2.5 mv
AL IMPEDENCE ICD: 456 Ohm
AL THRESHOLD: 1 V
BATTERY VOLTAGE: 3.19 V
HV IMPEDENCE: 70 Ohm
RV LEAD THRESHOLD: 0.5 V
TZON-0003FASTVT: 300 ms

## 2011-12-22 MED ORDER — AMIODARONE HCL 100 MG PO TABS: 100.0000 mg | ORAL_TABLET | Freq: Every day | ORAL | Status: DC

## 2011-12-22 NOTE — Patient Instructions (Addendum)
Please send a [facility] transmission on 11/ 18/13  Your physician recommends that you schedule a follow-up appointment in: 4 weeks with Dr. Graciela Husbands  Decrease your Amiodarone to 100 mg a day.

## 2011-12-22 NOTE — Assessment & Plan Note (Signed)
As above.

## 2011-12-22 NOTE — Assessment & Plan Note (Signed)
Stable on current medications 

## 2011-12-22 NOTE — Progress Notes (Signed)
HPI  Chris Price is a 71 y.o. male Seen in followup for an ICD implanted for primary prevention in  nonischemic cardiomyopathy further complicated by sinus bradycardia-date of implant May 2013. He also has a history of atrial fibrillation for which he is managed with dabigitran and amiodarone.  He is much better in terms of exercise tolerance. However, he has had significant aggravation of his tremor so that he can take pictures anymore, eating, is quite noticeable and discombobulating. It is no better with the recent decrease of his amiodarone from 400-200 mg a day. Notably he does not have any cough   Past Medical History  Diagnosis Date  . Lumbar spinal stenosis   . Degenerative disk disease     lumbar  . Disc herniation   . Hyperlipidemia   . Cardiomyopathy -nonischemic     moderate to severe EF 25-30% March 2013  . Chronic systolic heart failure 08/25/2011  . ICD (implantable cardiac defibrillator) in place   . CHF (congestive heart failure)   . Sleep apnea     uses cpap  . GERD (gastroesophageal reflux disease)   . Neuromuscular disorder     neauropathy   / siatica  . Coronary artery disease 08/15/2011    Moderate nonobstructive CAD with 60% mid LAD, 60% proximal RCA and 30 % stenossis left circumflex.  . Hypertension   . Atrial fibrillation   . IVCD (intraventricular conduction defect)     Nonspecific QRS duration 122  . Persistent atrial fibrillation 11/07/2011    Past Surgical History  Procedure Date  . Lumbar laminectomy     L2 through S1 with wide decompression of the thecal sac and nerve roots.  . Cardioversion 08/2011    [facility]  . Cardiac catheterization 08/15/2011    [facility]   . Hernia repair   . Icd 09/08/2011  . Back surgery   . Cooled thermotherapy 2007    [facility], complicated by incontinence  . Prostate biopsy 2013    Current Outpatient Prescriptions  Medication Sig Dispense Refill  . amiodarone (PACERONE) 200 MG tablet Take 1 tablet (200 mg total) by  mouth daily.  60 tablet  5  . atorvastatin (LIPITOR) 40 MG tablet Take 1 tablet (40 mg total) by mouth daily.  30 tablet  5  . carvedilol (COREG) 25 MG tablet Take 1 tablet (25 mg total) by mouth 2 (two) times daily with a meal.  60 tablet  5  . Coenzyme Q10-Levocarnitine (CO Q-10 PLUS PO) Take 1 tablet by mouth daily.       . dabigatran (PRADAXA) 150 MG CAPS Take 1 capsule (150 mg total) by mouth 2 (two) times daily.  60 capsule  5  . fish oil-omega-3 fatty acids 1000 MG capsule Take 2 g by mouth 3 (three) times daily. Taking 360 daily      . furosemide (LASIX) 40 MG tablet Take 1 tablet (40 mg total) by mouth daily.  30 tablet  1  . lisinopril (PRINIVIL,ZESTRIL) 20 MG tablet Take 1 tablet (20 mg total) by mouth daily.  30 tablet  3  . omeprazole (PRILOSEC) 20 MG capsule Take 1 capsule (20 mg total) by mouth daily as needed. Acid reflux.  30 capsule  5  . spironolactone (ALDACTONE) 12.5 mg TABS Take 0.5 tablets (12.5 mg total) by mouth daily.  30 tablet  5  . Tamsulosin HCl (FLOMAX) 0.4 MG CAPS Take 0.4 mg by mouth daily.        No Known Allergies  Review of Systems negative except from HPI and PMH  Physical Exam BP 108/72  Pulse 64  Ht 5\' 6"  (1.676 m)  Wt 218 lb 12 oz (99.224 kg)  BMI 35.31 kg/m2 Well developed and well nourished in no acute distress HENT normal E scleral and icterus clear Neck Supple JVP flat; carotids brisk and full Clear to ausculation Device pocket well healed; without hematoma or erythema Regular rate and rhythm, no murmurs gallops or rub Soft with active bowel sounds No clubbing cyanosis none Edema Alert and oriented, grossly normal motor and sensory function Skin Warm and Dry    Assessment and  Plan

## 2011-12-22 NOTE — Assessment & Plan Note (Signed)
The patient's device was interrogated and the information was fully reviewed.  The device was reprogrammed to  Maximize battery longevity. We reviewed the episodes of atrial fibrillation; heart rate control is adequate for these episodes

## 2011-12-22 NOTE — Assessment & Plan Note (Signed)
Continue current guidelines directed therapy; he is euvolemic

## 2011-12-22 NOTE — Assessment & Plan Note (Signed)
He is having about 4% atrial fibrillation. He is largely unaware of it. He is on anticoagulation appropriately. His amiodarone may be contributing to his tremor. Frequently neurotoxicity R. dose related; however, the lack of improvement associated with a decrease of 400-200 does not need any sanguine that further down titration of his amiodarone will be successful. However, we will decrease it to 200-100 and reassess his tremor and a month. If at that time we have to stop his amiodarone, we will then reassess whether rate control or rhythm control is a better long-term approach. Potentially could use dofetilide

## 2011-12-23 ENCOUNTER — Ambulatory Visit (INDEPENDENT_AMBULATORY_CARE_PROVIDER_SITE_OTHER): Payer: Medicare PPO | Admitting: Internal Medicine

## 2011-12-23 ENCOUNTER — Encounter: Payer: Self-pay | Admitting: Internal Medicine

## 2011-12-23 VITALS — BP 100/60 | HR 84 | Temp 98.0°F | Resp 16 | Wt 219.0 lb

## 2011-12-23 DIAGNOSIS — R7309 Other abnormal glucose: Secondary | ICD-10-CM

## 2011-12-23 DIAGNOSIS — N320 Bladder-neck obstruction: Secondary | ICD-10-CM

## 2011-12-23 DIAGNOSIS — R739 Hyperglycemia, unspecified: Secondary | ICD-10-CM | POA: Insufficient documentation

## 2011-12-23 DIAGNOSIS — N139 Obstructive and reflux uropathy, unspecified: Secondary | ICD-10-CM

## 2011-12-23 DIAGNOSIS — I2510 Atherosclerotic heart disease of native coronary artery without angina pectoris: Secondary | ICD-10-CM

## 2011-12-23 DIAGNOSIS — I429 Cardiomyopathy, unspecified: Secondary | ICD-10-CM

## 2011-12-23 DIAGNOSIS — I428 Other cardiomyopathies: Secondary | ICD-10-CM

## 2011-12-23 NOTE — Progress Notes (Signed)
Patient ID: Chris Price, male   DOB: 16-Apr-1941, 71 y.o.   MRN: [id number]  Patient Active Problem List  Diagnosis  . IVCD (intraventricular conduction defect)  . Cardiomyopathy  . Chronic systolic heart failure  . ICD -[employer]  . Hearing loss in right ear  . Elevated PSA, less than 10 ng/ml  . Coronary artery disease  . Hypertension  . Paroxysmal atrial fibrillation  . Tremor  . Hyperglycemia  . Bladder outlet obstruction    Subjective:  CC:   Chief Complaint  Patient presents with  . Follow-up    3 month    HPI:   Chris Price a 71 y.o. male who presents Follow up on acute and chronic medical problems.  He underwent a recent prostate biopsy for elevated PSA and developed outlet obstruction requiring placement of Foley catheter for a period of 2 weeks. The Foley was removed yesterday. He continues to have dysuria and decreased stream but is able to void.   He was prescribed ciprofloxacin and Azol by Dr. Achilles Price for abnormal UA and follows up with him next week. , He has been taking Flomax for at least 3 months with no considerable improvement. He is quite frustrated by his urinary symptoms. He has no other acute issues today.   Past Medical History  Diagnosis Date  . Lumbar spinal stenosis   . Degenerative disk disease     lumbar  . Disc herniation   . Hyperlipidemia   . Cardiomyopathy -nonischemic     moderate to severe EF 25-30% March 2013  . Chronic systolic heart failure 08/25/2011  . ICD (implantable cardiac defibrillator) in place   . CHF (congestive heart failure)   . Sleep apnea     uses cpap  . GERD (gastroesophageal reflux disease)   . Neuromuscular disorder     neauropathy   / siatica  . Coronary artery disease 08/15/2011    Moderate nonobstructive CAD with 60% mid LAD, 60% proximal RCA and 30 % stenossis left circumflex.  . Hypertension   . Atrial fibrillation   . IVCD (intraventricular conduction defect)     Nonspecific QRS duration 122  .  Persistent atrial fibrillation 11/07/2011    Past Surgical History  Procedure Date  . Lumbar laminectomy     L2 through S1 with wide decompression of the thecal sac and nerve roots.  . Card[facility]version 08/2011    [facility]  . Cardiac catheterization 08/15/2011    [facility]   . Hernia repair   . Icd 09/08/2011  . Back surgery   . Cooled thermotherapy 2007    Chris Price, complicated by incontinence  . Prostate biopsy 2013         The following portions of the patient's history were reviewed and updated as appropriate: Allergies, current medications, and problem list.    Review of Systems:   A comprehensive ROS was done and positive for decreased urinary stream.   The rest was negative.   History   Social History  . Marital Status: Married    Spouse Name: N/A    Number of Children: N/A  . Years of Education: N/A   Occupational History  . Not on file.   Social History Main Topics  . Smoking status: Never Smoker   . Smokeless tobacco: Never Used  . Alcohol Use: No  . Drug Use: No  . Sexually Active: Not Currently   Other Topics Concern  . Not on file   Social History Narrative  . No  narrative on file    Objective:  BP 100/60  Pulse 84  Temp 98 F (36.7 C) (Oral)  Resp 16  Wt 219 lb (99.338 kg)  SpO2 95%  General appearance: alert, cooperative and appears stated age Ears: normal TM's and external ear canals both ears Throat: lips, mucosa, and tongue normal; teeth and gums normal Neck: no adenopathy, no carotid bruit, supple, symmetrical, trachea midline and thyroid not enlarged, symmetric, no tenderness/mass/nodules Back: symmetric, no curvature. ROM normal. No CVA tenderness. Lungs: clear to auscultation bilaterally Heart: regular rate and rhythm, S1, S2 normal, no murmur, click, rub or gallop Abdomen: soft, non-tender; bowel sounds normal; no masses,  no organomegaly Pulses: 2+ and symmetric Skin: Skin color, texture, turgor normal. No rashes or lesions Lymph nodes:  Cervical, supraclavicular, and axillary nodes normal.  Assessment and Plan:  Bladder outlet obstruction Secondary to prostate enlargement versus prostatitis which occurred after recent prostate biopsy for elevated PSA. He is now able to void albeit with decreased stream. I suggested that he discuss this with Chris Price when he sees him next the next week for consideration of additional therapy  Coronary artery disease He has moderate nonobstructive coronary artery disease by prior cardiac catheterization. He remains asymptomatic currently with no chest pain. He is on appropriate medications.  Cardiomyopathy Moderate to severe ischemic cardiomyopathy with ejection fraction 25-35%. He was weighing herself daily and has had no significant weight gain. No change in medications today.   Updated Medication List Outpatient Encounter Prescriptions as of 12/23/2011  Medication Sig Dispense Refill  . amiodarone (PACERONE) 100 MG tablet Take 1 tablet (100 mg total) by mouth daily.  90 tablet  3  . atorvastatin (LIPITOR) 40 MG tablet Take 1 tablet (40 mg total) by mouth daily.  30 tablet  5  . carvedilol (COREG) 25 MG tablet Take 1 tablet (25 mg total) by mouth 2 (two) times daily with a meal.  60 tablet  5  . Coenzyme Q10-Levocarnitine (CO Q-10 PLUS PO) Take 1 tablet by mouth daily.       . dabigatran (PRADAXA) 150 MG CAPS Take 1 capsule (150 mg total) by mouth 2 (two) times daily.  60 capsule  5  . fish oil-omega-3 fatty acids 1000 MG capsule Take 2 g by mouth 3 (three) times daily. Taking 360 daily      . furosemide (LASIX) 40 MG tablet Take 1 tablet (40 mg total) by mouth daily.  30 tablet  1  . lisinopril (PRINIVIL,ZESTRIL) 20 MG tablet Take 1 tablet (20 mg total) by mouth daily.  30 tablet  3  . omeprazole (PRILOSEC) 20 MG capsule Take 1 capsule (20 mg total) by mouth daily as needed. Acid reflux.  30 capsule  5  . spironolactone (ALDACTONE) 12.5 mg TABS Take 0.5 tablets (12.5 mg total) by mouth  daily.  30 tablet  5  . Tamsulosin HCl (FLOMAX) 0.4 MG CAPS Take 0.4 mg by mouth daily.         Orders Placed This Encounter  Procedures  . Hemoglobin A1c    No Follow-up on file.

## 2011-12-24 ENCOUNTER — Emergency Department: Payer: Self-pay | Admitting: Emergency Medicine

## 2011-12-24 LAB — URINALYSIS, COMPLETE
Bacteria: NONE SEEN
Bilirubin,UR: NEGATIVE
Blood: NEGATIVE
Glucose,UR: NEGATIVE mg/dL (ref 0–75)
Leukocyte Esterase: NEGATIVE
Nitrite: NEGATIVE
RBC,UR: 3 /HPF (ref 0–5)
Specific Gravity: 1.008 (ref 1.003–1.030)
Squamous Epithelial: NONE SEEN
WBC UR: <1 /HPF (ref 0–5)

## 2011-12-25 ENCOUNTER — Encounter: Payer: Self-pay | Admitting: Internal Medicine

## 2011-12-25 DIAGNOSIS — N320 Bladder-neck obstruction: Secondary | ICD-10-CM | POA: Insufficient documentation

## 2011-12-25 NOTE — Assessment & Plan Note (Signed)
Moderate to severe ischemic cardiomyopathy with ejection fraction 25-35%. He was weighing herself daily and has had no significant weight gain. No change in medications today.

## 2011-12-25 NOTE — Assessment & Plan Note (Addendum)
Secondary to prostate enlargement versus prostatitis which occurred after recent prostate biopsy for elevated PSA. He is now able to void albeit with decreased stream. I suggested that he discuss this with Dr. cope when he sees him next the next week for consideration of additional therapy

## 2011-12-25 NOTE — Assessment & Plan Note (Signed)
He has moderate nonobstructive coronary artery disease by prior cardiac catheterization. He remains asymptomatic currently with no chest pain. He is on appropriate medications.

## 2011-12-26 LAB — URINE CULTURE

## 2011-12-28 ENCOUNTER — Other Ambulatory Visit: Payer: Self-pay | Admitting: Cardiovascular Disease

## 2012-01-04 ENCOUNTER — Telehealth: Payer: Self-pay | Admitting: Internal Medicine

## 2012-01-04 MED ORDER — ATORVASTATIN CALCIUM 40 MG PO TABS: 40.0000 mg | ORAL_TABLET | Freq: Every day | ORAL | Status: DC

## 2012-01-04 NOTE — Telephone Encounter (Signed)
Cell  [phone] Pt  Spouse came in today and wanted to check on rx refill Chol meds Pt spouse not sure of the name [facility]

## 2012-01-17 ENCOUNTER — Telehealth: Payer: Self-pay | Admitting: Cardiovascular Disease

## 2012-01-17 NOTE — Telephone Encounter (Signed)
Pt came by office today to see when he needs to stop his pradaxa. Pt doesn't know how long he needs to be off. Pt is to have prostate surgery on 9/17. He has to go for preop on Thursday.

## 2012-01-17 NOTE — Telephone Encounter (Signed)
See below and advise thanks 

## 2012-01-18 ENCOUNTER — Ambulatory Visit: Payer: Self-pay | Admitting: Urology

## 2012-01-18 LAB — CBC WITH DIFFERENTIAL/PLATELET
Basophil #: 0.1 10*3/uL (ref 0.0–0.1)
Eosinophil %: 6.4 %
HCT: 42 % (ref 40.0–52.0)
Lymphocyte #: 1.3 10*3/uL (ref 1.0–3.6)
Lymphocyte %: 25.2 %
MCHC: 34 g/dL (ref 32.0–36.0)
Monocyte #: 0.7 x10 3/mm (ref 0.2–1.0)
Monocyte %: 13.9 %
Neutrophil %: 53.5 %
Platelet: 209 10*3/uL (ref 150–440)
RDW: 14.3 % (ref 11.5–14.5)
WBC: 5.3 10*3/uL (ref 3.8–10.6)

## 2012-01-18 LAB — BASIC METABOLIC PANEL
Chloride: 105 mmol/L (ref 98–107)
Co2: 31 mmol/L (ref 21–32)
Creatinine: 1.1 mg/dL (ref 0.60–1.30)
Glucose: 81 mg/dL (ref 65–99)
Osmolality: 283 (ref 275–301)
Potassium: 4.1 mmol/L (ref 3.5–5.1)
Sodium: 141 mmol/L (ref 136–145)

## 2012-01-18 NOTE — Telephone Encounter (Signed)
5 doses off should be sufficient Thanks steve

## 2012-01-18 NOTE — Telephone Encounter (Signed)
Pt informed Understanding verb 

## 2012-01-19 ENCOUNTER — Encounter: Payer: Self-pay | Admitting: Internal Medicine

## 2012-01-19 ENCOUNTER — Ambulatory Visit (INDEPENDENT_AMBULATORY_CARE_PROVIDER_SITE_OTHER): Payer: Medicare PPO | Admitting: Internal Medicine

## 2012-01-19 VITALS — BP 128/60 | HR 76 | Ht 66.0 in | Wt 215.0 lb

## 2012-01-19 DIAGNOSIS — I5022 Chronic systolic (congestive) heart failure: Secondary | ICD-10-CM

## 2012-01-19 DIAGNOSIS — I429 Cardiomyopathy, unspecified: Secondary | ICD-10-CM

## 2012-01-19 DIAGNOSIS — I4891 Unspecified atrial fibrillation: Secondary | ICD-10-CM

## 2012-01-19 DIAGNOSIS — I480 Paroxysmal atrial fibrillation: Secondary | ICD-10-CM

## 2012-01-19 DIAGNOSIS — Z95810 Presence of automatic (implantable) cardiac defibrillator: Secondary | ICD-10-CM

## 2012-01-19 DIAGNOSIS — I428 Other cardiomyopathies: Secondary | ICD-10-CM

## 2012-01-19 LAB — ICD DEVICE OBSERVATION
AL THRESHOLD: 1 V
ATRIAL PACING ICD: 68.4 pct
BATTERY VOLTAGE: 3.19 V
HV IMPEDENCE: 69 Ohm
RV LEAD IMPEDENCE ICD: 475 Ohm
RV LEAD THRESHOLD: 0.5 V

## 2012-01-19 NOTE — Patient Instructions (Addendum)
Your physician wants you to follow-up in: 3 month with Dr. Graciela Husbands. [facility] transmission 04/23/12. You will receive a reminder letter in the mail two months in advance. If you don't receive a letter, please call our office to schedule the follow-up appointment.

## 2012-01-19 NOTE — Assessment & Plan Note (Signed)
He continues with paroxysms of atrial fibrillation. We will discontinue his amiodarone however because of the ongoing problems with tremor. He will continue his dabigitran although be held around the time of his prostate surgery.

## 2012-01-19 NOTE — Assessment & Plan Note (Signed)
The patient's device was interrogated.  The information was reviewed. No changes were made in the programming.    

## 2012-01-19 NOTE — Progress Notes (Signed)
Patient Care Team: Sherlene Shams, MD as PCP - General (Internal Medicine)   HPI  Chris Price is a 71 y.o. male  Seen in followup for an ICD implanted for primary prevention in nonischemic cardiomyopathy further complicated by sinus bradycardia-date of implant May 2013. He also has a history of atrial fibrillation for which he is managed with dabigitran and amiodarone.  He is much better in terms of exercise tolerance. However, he has had significant aggravation of his tremor so that he can take pictures anymore, eating, is quite noticeable and discombobulating. It is no better with the recent decrease of his amiodarone from 400-200 mg a day and most recently to 100 mg a day.  He is anticipated prostate surgery next Tuesday for persistent obstruction which has required an indwelling Foley for the last 3 months.  From a heart failure point of view he is euvolemic. He's had no edema. His breathing is better. He denies chest pain. She  Past Medical History  Diagnosis Date  . Lumbar spinal stenosis   . Degenerative disk disease     lumbar  . Disc herniation   . Hyperlipidemia   . Cardiomyopathy -nonischemic     moderate to severe EF 25-30% March 2013  . Chronic systolic heart failure 08/25/2011  . ICD (implantable cardiac defibrillator) in place   . CHF (congestive heart failure)   . Sleep apnea     uses cpap  . GERD (gastroesophageal reflux disease)   . Neuromuscular disorder     neauropathy   / siatica  . Coronary artery disease 08/15/2011    Moderate nonobstructive CAD with 60% mid LAD, 60% proximal RCA and 30 % stenossis left circumflex.  . Hypertension   . Atrial fibrillation   . IVCD (intraventricular conduction defect)     Nonspecific QRS duration 122  . Persistent atrial fibrillation 11/07/2011    Past Surgical History  Procedure Date  . Lumbar laminectomy     L2 through S1 with wide decompression of the thecal sac and nerve roots.  . Cardioversion 08/2011    [facility]    . Cardiac catheterization 08/15/2011    [facility]   . Hernia repair   . Icd 09/08/2011  . Back surgery   . Cooled thermotherapy 2007    [facility], complicated by incontinence  . Prostate biopsy 2013    Current Outpatient Prescriptions  Medication Sig Dispense Refill  . amiodarone (PACERONE) 100 MG tablet Take 1 tablet (100 mg total) by mouth daily.  90 tablet  3  . atorvastatin (LIPITOR) 40 MG tablet Take 1 tablet (40 mg total) by mouth daily.  90 tablet  1  . carvedilol (COREG) 25 MG tablet Take 1 tablet (25 mg total) by mouth 2 (two) times daily with a meal.  60 tablet  5  . ciprofloxacin (CIPRO) 500 MG tablet Take 500 mg by mouth 2 (two) times daily.      . Coenzyme Q10-Levocarnitine (CO Q-10 PLUS PO) Take 1 tablet by mouth daily.       . finasteride (PROSCAR) 5 MG tablet Take 5 mg by mouth daily.      . fish oil-omega-3 fatty acids 1000 MG capsule Take 2 g by mouth 3 (three) times daily. Taking 360 daily      . furosemide (LASIX) 40 MG tablet Take 1 tablet (40 mg total) by mouth daily.  30 tablet  1  . lisinopril (PRINIVIL,ZESTRIL) 20 MG tablet Take 1 tablet (20 mg total) by mouth daily.  30 tablet  3  . omeprazole (PRILOSEC) 20 MG capsule Take 1 capsule (20 mg total) by mouth daily as needed. Acid reflux.  30 capsule  5  . spironolactone (ALDACTONE) 12.5 mg TABS Take 0.5 tablets (12.5 mg total) by mouth daily.  30 tablet  5  . Tamsulosin HCl (FLOMAX) 0.4 MG CAPS Take 0.4 mg by mouth daily.      . dabigatran (PRADAXA) 150 MG CAPS Take 1 capsule (150 mg total) by mouth 2 (two) times daily.  60 capsule  5    No Known Allergies  Review of Systems negative except from HPI and PMH  Physical Exam BP 128/60  Pulse 76  Ht 5\' 6"  (1.676 m)  Wt 215 lb (97.523 kg)  BMI 34.70 kg/m2 Well developed and well nourished in no acute distress HENT normal E scleral and icterus clear Neck Supple JVP flat; carotids brisk and full Clear to ausculation  Regular rate and rhythm,  Soft with active bowel  sounds No clubbing cyanosis none Edema Alert and oriented, grossly normal motor and sensory function Skin Warm and Dry    Assessment and  Plan

## 2012-01-19 NOTE — Assessment & Plan Note (Signed)
Euvolemic. Continue current meds 

## 2012-01-23 DIAGNOSIS — N529 Male erectile dysfunction, unspecified: Secondary | ICD-10-CM | POA: Insufficient documentation

## 2012-01-23 DIAGNOSIS — N401 Enlarged prostate with lower urinary tract symptoms: Secondary | ICD-10-CM | POA: Insufficient documentation

## 2012-01-24 ENCOUNTER — Ambulatory Visit: Payer: Self-pay | Admitting: Urology

## 2012-01-25 LAB — BASIC METABOLIC PANEL
Anion Gap: 6 — ABNORMAL LOW (ref 7–16)
BUN: 14 mg/dL (ref 7–18)
Calcium, Total: 8.2 mg/dL — ABNORMAL LOW (ref 8.5–10.1)
Chloride: 107 mmol/L (ref 98–107)
Co2: 26 mmol/L (ref 21–32)
EGFR (Non-African Amer.): >60
Potassium: 4.5 mmol/L (ref 3.5–5.1)

## 2012-01-25 LAB — MAGNESIUM: Magnesium: 1.8 mg/dL

## 2012-01-25 LAB — CBC WITH DIFFERENTIAL/PLATELET
Basophil #: 0.1 10*3/uL (ref 0.0–0.1)
Eosinophil #: 0.3 10*3/uL (ref 0.0–0.7)
HGB: 12.5 g/dL — ABNORMAL LOW (ref 13.0–18.0)
Lymphocyte %: 17.7 %
MCHC: 33.6 g/dL (ref 32.0–36.0)
Monocyte #: 1 x10 3/mm (ref 0.2–1.0)
Monocyte %: 14 %
Neutrophil #: 4.3 10*3/uL (ref 1.4–6.5)
Neutrophil %: 63.2 %
Platelet: 174 10*3/uL (ref 150–440)
RDW: 14.4 % (ref 11.5–14.5)

## 2012-03-12 ENCOUNTER — Other Ambulatory Visit: Payer: Self-pay | Admitting: Internal Medicine

## 2012-03-12 ENCOUNTER — Telehealth: Payer: Self-pay | Admitting: Internal Medicine

## 2012-03-12 MED ORDER — DABIGATRAN ETEXILATE MESYLATE 150 MG PO CAPS: 150.0000 mg | ORAL_CAPSULE | Freq: Two times a day (BID) | ORAL | Status: DC

## 2012-03-12 NOTE — Telephone Encounter (Signed)
Spouse called checking to see where his rx was sent prodax 150  Pt thought it was going to [facility] Please advise

## 2012-03-15 NOTE — Telephone Encounter (Signed)
Left message on patient cell voice mail.

## 2012-03-21 ENCOUNTER — Ambulatory Visit (INDEPENDENT_AMBULATORY_CARE_PROVIDER_SITE_OTHER): Payer: Medicare PPO | Admitting: Internal Medicine

## 2012-03-21 ENCOUNTER — Encounter: Payer: Self-pay | Admitting: Internal Medicine

## 2012-03-21 VITALS — BP 110/64 | HR 86 | Temp 97.6°F | Ht 67.0 in | Wt 217.0 lb

## 2012-03-21 DIAGNOSIS — E785 Hyperlipidemia, unspecified: Secondary | ICD-10-CM

## 2012-03-21 DIAGNOSIS — N320 Bladder-neck obstruction: Secondary | ICD-10-CM

## 2012-03-21 DIAGNOSIS — I951 Orthostatic hypotension: Secondary | ICD-10-CM

## 2012-03-21 DIAGNOSIS — Z23 Encounter for immunization: Secondary | ICD-10-CM

## 2012-03-21 DIAGNOSIS — Z79899 Other long term (current) drug therapy: Secondary | ICD-10-CM

## 2012-03-21 LAB — LIPID PANEL
Cholesterol: 204 mg/dL — ABNORMAL HIGH (ref 0–200)
Triglycerides: 100 mg/dL (ref 0.0–149.0)

## 2012-03-21 LAB — COMPREHENSIVE METABOLIC PANEL
ALT: 39 U/L (ref 0–53)
AST: 37 U/L (ref 0–37)
Albumin: 3.8 g/dL (ref 3.5–5.2)
Alkaline Phosphatase: 58 U/L (ref 39–117)
Potassium: 4.2 mEq/L (ref 3.5–5.1)
Sodium: 139 mEq/L (ref 135–145)
Total Protein: 7.2 g/dL (ref 6.0–8.3)

## 2012-03-21 NOTE — Patient Instructions (Addendum)
Reduce the carvedilol from 25 mg twice daly to 12.5 twice daily to help with the dizziness that occurs with standing   You received the pneumonia vaccine  Let me know if the drowsiness and dizziness imporve

## 2012-03-21 NOTE — Progress Notes (Signed)
Patient ID: Chris Price, male   DOB: May 18, 1940, 71 y.o.   MRN: [id number]   Patient Active Problem List  Diagnosis  . IVCD (intraventricular conduction defect)  . Cardiomyopathy  . Chronic systolic heart failure  . ICD -Medtronic  . Hearing loss in right ear  . Elevated PSA, less than 10 ng/ml  . Coronary artery disease  . Hypertension  . Paroxysmal atrial fibrillation  . Tremor  . Hyperglycemia  . Bladder outlet obstruction  . Orthostasis  . Hyperlipidemia LDL goal < 70    Subjective:  CC:   Chief Complaint  Patient presents with  . Follow-up    HPI:   Chris Muzyka O'Brienis a 71 y.o. male who presents for three-month followup on chronic conditions. His tremor has improved  since stopping amiodarone but  is not completely gone. His wife notices that his tremor is less prominent at night and aggravated by use of hand. He had 2 emergency room visits for bladder outlet obstruction,  and underwent  TURP by [facility] in late Sept  for resection of a grapefruit sized prostate with moderate relief .  No more bleeding .   he has Occasional urinary urge  incontinence  and dribbling. He has mouth due to OSA and use of and  CPAP.  But wants to try vesicare anyway.  New cc of sudden onset of dizziness when he stands up. He takes about a minute for the dizziness to resolve. He denies any true vertigo.  .   he feels drowsy a lot and his hands feel cold all the time     Past Medical History  Diagnosis Date  . Lumbar spinal stenosis   . Degenerative disk disease     lumbar  . Disc herniation   . Hyperlipidemia   . Cardiomyopathy -nonischemic     moderate to severe EF 25-30% March 2013  . Chronic systolic heart failure 08/25/2011  . ICD (implantable cardiac defibrillator) in place   . CHF (congestive heart failure)   . Sleep apnea     uses cpap  . GERD (gastroesophageal reflux disease)   . Neuromuscular disorder     neauropathy   / siatica  . Coronary artery disease 08/15/2011   Moderate nonobstructive CAD with 60% mid LAD, 60% proximal RCA and 30 % stenossis left circumflex.  . Hypertension   . Atrial fibrillation   . IVCD (intraventricular conduction defect)     Nonspecific QRS duration 122  . Persistent atrial fibrillation 11/07/2011    Past Surgical History  Procedure Date  . Lumbar laminectomy     L2 through S1 with wide decompression of the thecal sac and nerve roots.  . Card[facility]version 08/2011    [facility]  . Cardiac catheterization 08/15/2011    [facility]   . Hernia repair   . Icd 09/08/2011  . Back surgery   . Cooled thermotherapy 2007    [facility], complicated by incontinence  . Prostate biopsy 2013         The following portions of the patient's history were reviewed and updated as appropriate: Allergies, current medications, and problem list.    Review of Systems:   12 Pt  review of systems was negative except those addressed in the HPI,     History   Social History  . Marital Status: Married    Spouse Name: N/A    Number of Children: N/A  . Years of Education: N/A   Occupational History  . Not on file.  Social History Main Topics  . Smoking status: Never Smoker   . Smokeless tobacco: Never Used  . Alcohol Use: No  . Drug Use: No  . Sexually Active: Not Currently   Other Topics Concern  . Not on file   Social History Narrative  . No narrative on file    Objective:  BP 110/64  Pulse 86  Temp 97.6 F (36.4 C) (Oral)  Ht 5\' 7"  (1.702 m)  Wt 217 lb (98.431 kg)  BMI 33.99 kg/m2  SpO2 94%  General appearance: alert, cooperative and appears stated age Ears: normal TM's and external ear canals both ears Throat: lips, mucosa, and tongue normal; teeth and gums normal Neck: no adenopathy, no carotid bruit, supple, symmetrical, trachea midline and thyroid not enlarged, symmetric, no tenderness/mass/nodules Back: symmetric, no curvature. ROM normal. No CVA tenderness. Lungs: clear to auscultation bilaterally Heart: regular rate and  rhythm, S1, S2 normal, no murmur, click, rub or gallop Abdomen: soft, non-tender; bowel sounds normal; no masses,  no organomegaly Pulses: 2+ and symmetric Skin: Skin color, texture, turgor normal. No rashes or lesions Lymph nodes: Cervical, supraclavicular, and axillary nodes normal.  Assessment and Plan:  Orthostasis Orthostatic bps today :systolic drop from 110 to 90 and diatolic from 75 to 60.  I have reduced his lasix to 20 mg daily,  Follow weights. Reduce carvedilol to 12. 5 mg bid   Hyperlipidemia LDL goal < 70 Increasing lipitor to 80 mg daily for current LDL 140   Bladder outlet obstruction Secondary to enormous prostate. Symptoms of no resolved with recent TURP procedure. However he is having some urge incontinence. Trial of Vesicare.   Updated Medication List Outpatient Encounter Prescriptions as of 03/21/2012  Medication Sig Dispense Refill  . amiodarone (PACERONE) 100 MG tablet Take 1 tablet (100 mg total) by mouth daily.  90 tablet  3  . atorvastatin (LIPITOR) 80 MG tablet Take 1 tablet (80 mg total) by mouth daily.  90 tablet  1  . carvedilol (COREG) 12.5 MG tablet Take 2 tablets (25 mg total) by mouth 2 (two) times daily with a meal.  180 tablet  3  . Coenzyme Q10-Levocarnitine (CO Q-10 PLUS PO) Take 1 tablet by mouth daily.       . dabigatran (PRADAXA) 150 MG CAPS Take 1 capsule (150 mg total) by mouth 2 (two) times daily.  180 capsule  3  . finasteride (PROSCAR) 5 MG tablet Take 5 mg by mouth daily.      . fish oil-omega-3 fatty acids 1000 MG capsule Take 2 g by mouth 3 (three) times daily. Taking 360 daily      . furosemide (LASIX) 20 MG tablet Take 1 tablet (20 mg total) by mouth daily.  90 tablet  1  . lisinopril (PRINIVIL,ZESTRIL) 20 MG tablet Take 1 tablet (20 mg total) by mouth daily.  30 tablet  3  . omeprazole (PRILOSEC) 20 MG capsule Take 1 capsule (20 mg total) by mouth daily as needed. Acid reflux.  30 capsule  5  . spironolactone (ALDACTONE) 12.5 mg TABS  Take 0.5 tablets (12.5 mg total) by mouth daily.  30 tablet  5  . Tamsulosin HCl (FLOMAX) 0.4 MG CAPS Take 0.4 mg by mouth daily.      . [DISCONTINUED] atorvastatin (LIPITOR) 40 MG tablet Take 1 tablet (40 mg total) by mouth daily.  90 tablet  1  . [DISCONTINUED] carvedilol (COREG) 25 MG tablet Take 1 tablet (25 mg total) by mouth 2 (two) times  daily with a meal.  60 tablet  5  . [DISCONTINUED] furosemide (LASIX) 40 MG tablet Take 1 tablet (40 mg total) by mouth daily.  30 tablet  1  . [DISCONTINUED] furosemide (LASIX) 40 MG tablet Take 0.5 tablets (20 mg total) by mouth daily.  30 tablet  1  . ciprofloxacin (CIPRO) 500 MG tablet Take 500 mg by mouth 2 (two) times daily.         Orders Placed This Encounter  Procedures  . Pneumococcal polysaccharide vaccine 23-valent greater than or equal to 2yo subcutaneous/IM  . Lipid panel  . Comprehensive metabolic panel  . LDL cholesterol, direct    No Follow-up on file.

## 2012-03-22 DIAGNOSIS — I951 Orthostatic hypotension: Secondary | ICD-10-CM | POA: Insufficient documentation

## 2012-03-22 DIAGNOSIS — E785 Hyperlipidemia, unspecified: Secondary | ICD-10-CM | POA: Insufficient documentation

## 2012-03-22 MED ORDER — FUROSEMIDE 20 MG PO TABS: 20.0000 mg | ORAL_TABLET | Freq: Every day | ORAL | Status: DC

## 2012-03-22 MED ORDER — CARVEDILOL 12.5 MG PO TABS: 25.0000 mg | ORAL_TABLET | Freq: Two times a day (BID) | ORAL | Status: DC

## 2012-03-22 MED ORDER — FUROSEMIDE 40 MG PO TABS: 20.0000 mg | ORAL_TABLET | Freq: Every day | ORAL | Status: DC

## 2012-03-22 MED ORDER — ATORVASTATIN CALCIUM 80 MG PO TABS: 80.0000 mg | ORAL_TABLET | Freq: Every day | ORAL | Status: DC

## 2012-03-22 NOTE — Assessment & Plan Note (Addendum)
Orthostatic bps today :systolic drop from 110 to 90 and diatolic from 75 to 60.  I have reduced his lasix to 20 mg daily,  Follow weights. Reduce carvedilol to 12. 5 mg bid

## 2012-03-22 NOTE — Assessment & Plan Note (Signed)
Increasing lipitor to 80 mg daily for current LDL 140

## 2012-03-23 NOTE — Assessment & Plan Note (Signed)
Secondary to enormous prostate. Symptoms of no resolved with recent TURP procedure. However he is having some urge incontinence. Trial of Vesicare.

## 2012-04-23 ENCOUNTER — Encounter: Payer: Medicare PPO | Admitting: *Deleted

## 2012-05-07 ENCOUNTER — Encounter: Payer: Self-pay | Admitting: *Deleted

## 2012-05-08 ENCOUNTER — Other Ambulatory Visit: Payer: Self-pay | Admitting: Cardiovascular Disease

## 2012-05-08 MED ORDER — LISINOPRIL 20 MG PO TABS: 20.0000 mg | ORAL_TABLET | Freq: Every day | ORAL | Status: DC

## 2012-05-08 NOTE — Telephone Encounter (Signed)
Pt needs 90 sent to [facility] but also need 30 day sent to [facility] in [place].

## 2012-05-10 ENCOUNTER — Other Ambulatory Visit: Payer: Self-pay | Admitting: Internal Medicine

## 2012-05-10 NOTE — Telephone Encounter (Signed)
Pt needs 30 day sent to [facility].  90 day was sent to [facility]

## 2012-05-11 ENCOUNTER — Other Ambulatory Visit: Payer: Self-pay

## 2012-05-11 MED ORDER — LISINOPRIL 20 MG PO TABS: 20.0000 mg | ORAL_TABLET | Freq: Every day | ORAL | Status: DC

## 2012-05-11 NOTE — Telephone Encounter (Signed)
Refill sent for 30 day supply to [facility].

## 2012-05-14 ENCOUNTER — Ambulatory Visit (INDEPENDENT_AMBULATORY_CARE_PROVIDER_SITE_OTHER): Payer: Medicare PPO | Admitting: *Deleted

## 2012-05-14 DIAGNOSIS — I428 Other cardiomyopathies: Secondary | ICD-10-CM

## 2012-05-14 DIAGNOSIS — Z95810 Presence of automatic (implantable) cardiac defibrillator: Secondary | ICD-10-CM

## 2012-05-14 DIAGNOSIS — I5022 Chronic systolic (congestive) heart failure: Secondary | ICD-10-CM

## 2012-05-14 DIAGNOSIS — I429 Cardiomyopathy, unspecified: Secondary | ICD-10-CM

## 2012-05-14 LAB — REMOTE ICD DEVICE
AL AMPLITUDE: 2.1 mv
AL IMPEDENCE ICD: 475 Ohm
ATRIAL PACING ICD: 54.9 pct
BAMS-0001: 170 {beats}/min
FVT: 0
RV LEAD IMPEDENCE ICD: 513 Ohm
TOT-0001: 1
TZAT-0001ATACH: 2
TZAT-0001FASTVT: 1
TZAT-0002ATACH: NEGATIVE
TZAT-0002ATACH: NEGATIVE
TZAT-0002ATACH: NEGATIVE
TZAT-0012ATACH: 150 ms
TZAT-0012FASTVT: 170 ms
TZAT-0012SLOWVT: 170 ms
TZAT-0013SLOWVT: 3
TZAT-0018ATACH: NEGATIVE
TZAT-0018ATACH: NEGATIVE
TZAT-0019ATACH: 6 V
TZAT-0019ATACH: 6 V
TZAT-0019ATACH: 6 V
TZAT-0019SLOWVT: 8 V
TZAT-0020ATACH: 1.5 ms
TZAT-0020SLOWVT: 1.5 ms
TZON-0003SLOWVT: 300 ms
TZON-0004SLOWVT: 28
TZON-0004VSLOWVT: 32
TZON-0005SLOWVT: 12
TZST-0001ATACH: 5
TZST-0001FASTVT: 2
TZST-0001FASTVT: 3
TZST-0001FASTVT: 4
TZST-0001SLOWVT: 3
TZST-0001SLOWVT: 4
TZST-0001SLOWVT: 6
TZST-0002ATACH: NEGATIVE
TZST-0002FASTVT: NEGATIVE
TZST-0002FASTVT: NEGATIVE
TZST-0002FASTVT: NEGATIVE
TZST-0003SLOWVT: 35 J
TZST-0003SLOWVT: 35 J

## 2012-05-21 ENCOUNTER — Other Ambulatory Visit: Payer: Self-pay | Admitting: Internal Medicine

## 2012-05-22 NOTE — Telephone Encounter (Signed)
Pt should still have a refill for 90 days at the pharmacy.

## 2012-05-31 ENCOUNTER — Encounter: Payer: Self-pay | Admitting: Cardiovascular Disease

## 2012-05-31 ENCOUNTER — Ambulatory Visit (INDEPENDENT_AMBULATORY_CARE_PROVIDER_SITE_OTHER): Payer: Medicare PPO | Admitting: Cardiovascular Disease

## 2012-05-31 VITALS — BP 112/80 | HR 62 | Ht 66.0 in | Wt 220.2 lb

## 2012-05-31 DIAGNOSIS — I10 Essential (primary) hypertension: Secondary | ICD-10-CM

## 2012-05-31 DIAGNOSIS — I480 Paroxysmal atrial fibrillation: Secondary | ICD-10-CM

## 2012-05-31 DIAGNOSIS — I2510 Atherosclerotic heart disease of native coronary artery without angina pectoris: Secondary | ICD-10-CM

## 2012-05-31 DIAGNOSIS — I5022 Chronic systolic (congestive) heart failure: Secondary | ICD-10-CM

## 2012-05-31 DIAGNOSIS — I4891 Unspecified atrial fibrillation: Secondary | ICD-10-CM

## 2012-05-31 NOTE — Assessment & Plan Note (Signed)
He is currently in [place] Heart Association class 2 . No signs of fluid overload. He is currently on excellent heart failure medications. I recommend no changes in his medications.

## 2012-05-31 NOTE — Progress Notes (Signed)
HPI  [name] is a pleasant 72 year old male who is here today for a followup visit. He was diagnosed with congestive heart failure with severely reduced LV systolic function last year. He was also diagnosed with atrial fibrillation that initially failed cardioversion. He was hospitalized in April of last year with heart failure exacerbation. Ejection fraction was 27%. He underwent cardiac catheterization which showed moderate nonobstructive coronary artery disease. He improved significantly with Lasix and was placed on heart failure treatment. Amiodarone was started for atrial fibrillation he was anticoagulated with Pradaxa. A dual-chamber ICD was placed by Dr. Graciela Husbands.  Since last visit, the patient underwent TURP without complications. He is overall doing well and denies any chest pain, dyspnea, orthopnea or lower extremity edema. He stays active playing golf frequently. The dose of Lasix was decreased to 20 mg once daily by Dr. Darrick Huntsman. He reports no increased dyspnea or edema after the change.  No Known Allergies   Current Outpatient Prescriptions on File Prior to Visit  Medication Sig Dispense Refill  . amiodarone (PACERONE) 100 MG tablet Take 1 tablet (100 mg total) by mouth daily.  90 tablet  3  . atorvastatin (LIPITOR) 80 MG tablet Take 1 tablet (80 mg total) by mouth daily.  90 tablet  1  . carvedilol (COREG) 12.5 MG tablet Take 2 tablets (25 mg total) by mouth 2 (two) times daily with a meal.  180 tablet  3  . Coenzyme Q10-Levocarnitine (CO Q-10 PLUS PO) Take 1 tablet by mouth daily.       . dabigatran (PRADAXA) 150 MG CAPS Take 1 capsule (150 mg total) by mouth 2 (two) times daily.  180 capsule  3  . finasteride (PROSCAR) 5 MG tablet Take 5 mg by mouth daily.      . fish oil-omega-3 fatty acids 1000 MG capsule Take 2 g by mouth 3 (three) times daily. Taking 360 daily      . furosemide (LASIX) 20 MG tablet Take 1 tablet (20 mg total) by mouth daily.  90 tablet  1  . lisinopril  (PRINIVIL,ZESTRIL) 20 MG tablet Take 1 tablet (20 mg total) by mouth daily.  30 tablet  3  . omeprazole (PRILOSEC) 20 MG capsule Take 1 capsule (20 mg total) by mouth daily as needed. Acid reflux.  30 capsule  5  . spironolactone (ALDACTONE) 12.5 mg TABS Take 0.5 tablets (12.5 mg total) by mouth daily.  30 tablet  5  . Tamsulosin HCl (FLOMAX) 0.4 MG CAPS Take 0.4 mg by mouth daily.         Past Medical History  Diagnosis Date  . Lumbar spinal stenosis   . Degenerative disk disease     lumbar  . Disc herniation   . Hyperlipidemia   . Cardiomyopathy -nonischemic     moderate to severe EF 25-30% March 2013  . Chronic systolic heart failure 08/25/2011  . ICD (implantable cardiac defibrillator) in place   . CHF (congestive heart failure)   . Sleep apnea     uses cpap  . GERD (gastroesophageal reflux disease)   . Neuromuscular disorder     neauropathy   / siatica  . Coronary artery disease 08/15/2011    Moderate nonobstructive CAD with 60% mid LAD, 60% proximal RCA and 30 % stenossis left circumflex.  . Hypertension   . Atrial fibrillation   . IVCD (intraventricular conduction defect)     Nonspecific QRS duration 122  . Persistent atrial fibrillation 11/07/2011  Past Surgical History  Procedure Date  . Lumbar laminectomy     L2 through S1 with wide decompression of the thecal sac and nerve roots.  . [facility]version 08/2011    [facility]  . Cardiac catheterization 08/15/2011    [facility]   . Hernia repair   . Icd 09/08/2011  . Back surgery   . Cooled thermotherapy 2007    [facility]fe, complicated by incontinence  . Prostate biopsy 2013     Family History  Problem Relation Age of Onset  . Heart attack Father   . Heart attack Brother      History   Social History  . Marital Status: Married    Spouse Name: N/A    Number of Children: N/A  . Years of Education: N/A   Occupational History  . Not on file.   Social History Main Topics  . Smoking status: Never Smoker   . Smokeless  tobacco: Never Used  . Alcohol Use: No  . Drug Use: No  . Sexually Active: Not Currently   Other Topics Concern  . Not on file   Social History Narrative  . No narrative on file     PHYSICAL EXAM   BP 112/80  Pulse 62  Ht 5\' 6"  (1.676 m)  Wt 220 lb 4 oz (99.905 kg)  BMI 35.55 kg/m2 Constitutional: He is oriented to person, place, and time. He appears well-developed and well-nourished. No distress.  HENT: No nasal discharge.  Head: Normocephalic and atraumatic.  Eyes: Pupils are equal and round. Right eye exhibits no discharge. Left eye exhibits no discharge.  Neck: Normal range of motion. Neck supple. No JVD present. No thyromegaly present.  Cardiovascular: Normal rate, regular rhythm, normal heart sounds. Exam reveals no gallop and no friction rub. No murmur heard.  Pulmonary/Chest: Effort normal and breath sounds normal. No stridor. No respiratory distress. He has no wheezes. He has no rales. He exhibits no tenderness.  Abdominal: Soft. Bowel sounds are normal. He exhibits no distension. There is no tenderness. There is no rebound and no guarding.  Musculoskeletal: Normal range of motion. He exhibits no edema and no tenderness.  Neurological: He is alert and oriented to person, place, and time. Coordination normal.  Skin: Skin is warm and dry. No rash noted. He is not diaphoretic. No erythema. No pallor.  Psychiatric: He has a normal mood and affect. His behavior is normal. Judgment and thought content normal.       EKG: Electronic atrial pacemaker  -Nonspecific QRS widening and anterior fascicular block.   -Poor R-wave progression -nonspecific -consider old anterior infarct.   -  Nonspecific T-abnormality.   ABNORMAL    ASSESSMENT AND PLAN

## 2012-05-31 NOTE — Assessment & Plan Note (Signed)
Device interrogation recently showed that he is in 15 percent of the time. He is overall asymptomatic. Continue long-term anticoagulation which is well tolerated currently.

## 2012-05-31 NOTE — Assessment & Plan Note (Signed)
Continue medical therapy. He has no symptoms suggestive of angina.

## 2012-05-31 NOTE — Assessment & Plan Note (Signed)
Blood pressure is well controlled. He reports orthostatic dizziness which is likely due to the multiple medications he is on. I advised him to avoid sudden standing up

## 2012-05-31 NOTE — Patient Instructions (Addendum)
Continue same medications.  Follow up in 6 months.  

## 2012-06-27 ENCOUNTER — Ambulatory Visit (INDEPENDENT_AMBULATORY_CARE_PROVIDER_SITE_OTHER): Payer: Medicare PPO | Admitting: Internal Medicine

## 2012-06-27 ENCOUNTER — Encounter: Payer: Self-pay | Admitting: Internal Medicine

## 2012-06-27 VITALS — BP 116/78 | HR 78 | Temp 98.0°F | Resp 16 | Ht 67.0 in | Wt 218.0 lb

## 2012-06-27 DIAGNOSIS — I428 Other cardiomyopathies: Secondary | ICD-10-CM

## 2012-06-27 DIAGNOSIS — Z79899 Other long term (current) drug therapy: Secondary | ICD-10-CM

## 2012-06-27 DIAGNOSIS — I10 Essential (primary) hypertension: Secondary | ICD-10-CM

## 2012-06-27 DIAGNOSIS — E785 Hyperlipidemia, unspecified: Secondary | ICD-10-CM

## 2012-06-27 DIAGNOSIS — I951 Orthostatic hypotension: Secondary | ICD-10-CM

## 2012-06-27 DIAGNOSIS — R7309 Other abnormal glucose: Secondary | ICD-10-CM

## 2012-06-27 DIAGNOSIS — N320 Bladder-neck obstruction: Secondary | ICD-10-CM

## 2012-06-27 DIAGNOSIS — I429 Cardiomyopathy, unspecified: Secondary | ICD-10-CM

## 2012-06-27 DIAGNOSIS — R739 Hyperglycemia, unspecified: Secondary | ICD-10-CM

## 2012-06-27 LAB — LIPID PANEL
LDL Cholesterol: 96 mg/dL (ref 0–99)
Total CHOL/HDL Ratio: 5
Triglycerides: 170 mg/dL — ABNORMAL HIGH (ref 0.0–149.0)
VLDL: 34 mg/dL (ref 0.0–40.0)

## 2012-06-27 LAB — COMPREHENSIVE METABOLIC PANEL
AST: 31 U/L (ref 0–37)
Albumin: 4 g/dL (ref 3.5–5.2)
Alkaline Phosphatase: 49 U/L (ref 39–117)
BUN: 23 mg/dL (ref 6–23)
GFR: 76 mL/min (ref >60.00)
Glucose, Bld: 91 mg/dL (ref 70–99)
Potassium: 4.1 mEq/L (ref 3.5–5.1)
Sodium: 141 mEq/L (ref 135–145)
Total Bilirubin: 1 mg/dL (ref 0.3–1.2)
Total Protein: 7.2 g/dL (ref 6.0–8.3)

## 2012-06-27 NOTE — Progress Notes (Signed)
Patient ID: Chris Price, male   DOB: 09/17/1940, 72 y.o.   MRN: [id number]   Patient Active Problem List  Diagnosis  . IVCD (intraventricular conduction defect)  . Cardiomyopathy  . Chronic systolic heart failure  . ICD -Medtronic  . Hearing loss in right ear  . Elevated PSA, less than 10 ng/ml  . Coronary artery disease  . Hypertension  . Paroxysmal atrial fibrillation  . Tremor  . Hyperglycemia  . Bladder outlet obstruction  . Orthostasis  . Hyperlipidemia LDL goal < 70    Subjective:  CC:   Chief Complaint  Patient presents with  . Follow-up    3 month    HPI:   Chris Norwood O'Brienis a 72 y.o. male who presents for 3 month follow up on hypertension , hyperlipidemia, and ischemic cardiomyopathy.  He has been relatively inactive over the winter months .  He had an epsiode of dyspnea after pushing a heavy cart full of golf cart batteries [place] but denies any episode  Chest, jaw or arm pain .  His weight has been mostly stable with one or two exceptions,   which he treated with a extra dose of lasix.  He denies orthopnea.  Orthostasis still an issue most noticeable when he bends over to pick up a golf ball, but he has had no falls. Voiding better sine he had his TURP,  No incontinence issues.    Past Medical History  Diagnosis Date  . Lumbar spinal stenosis   . Degenerative disk disease     lumbar  . Disc herniation   . Hyperlipidemia   . Cardiomyopathy -nonischemic     moderate to severe EF 25-30% March 2013  . Chronic systolic heart failure 08/25/2011  . ICD (implantable cardiac defibrillator) in place   . CHF (congestive heart failure)   . Sleep apnea     uses cpap  . GERD (gastroesophageal reflux disease)   . Neuromuscular disorder     neauropathy   / siatica  . Coronary artery disease 08/15/2011    Moderate nonobstructive CAD with 60% mid LAD, 60% proximal RCA and 30 % stenossis left circumflex.  . Hypertension   . Atrial fibrillation    . IVCD (intraventricular conduction defect)     Nonspecific QRS duration 122  . Persistent atrial fibrillation 11/07/2011    Past Surgical History  Procedure Laterality Date  . Lumbar laminectomy      L2 through S1 with wide decompression of the thecal sac and nerve roots.  . Cardioversion  08/2011    [facility]  . Cardiac catheterization  08/15/2011    [facility]   . Hernia repair    . Icd  09/08/2011  . Back surgery    . Cooled thermotherapy  2007    [facility]fe, complicated by incontinence  . Prostate biopsy  2013       The following portions of the patient's history were reviewed and updated as appropriate: Allergies, current medications, and problem list.    Review of Systems:   Patient denies headache, fevers, malaise, unintentional weight loss, skin rash, eye pain, sinus congestion and sinus pain, sore throat, dysphagia,  hemoptysis , cough, dyspnea, wheezing, chest pain, palpitations, orthopnea, edema, abdominal pain, nausea, melena, diarrhea, constipation, flank pain, dysuria, hematuria, urinary  Frequency, nocturia, numbness, tingling, seizures,  Focal weakness, Loss of consciousness,  Tremor, insomnia, depression, anxiety, and suicidal ideation.     History   Social History  . Marital Status: Married  Spouse Name: N/A    Number of Children: N/A  . Years of Education: N/A   Occupational History  . Not on file.   Social History Main Topics  . Smoking status: Never Smoker   . Smokeless tobacco: Never Used  . Alcohol Use: No  . Drug Use: No  . Sexually Active: Not Currently   Other Topics Concern  . Not on file   Social History Narrative  . No narrative on file    Objective:  BP 116/78  Pulse 78  Temp(Src) 98 F (36.7 C) (Oral)  Resp 16  Ht 5\' 7"  (1.702 m)  Wt 218 lb (98.884 kg)  BMI 34.14 kg/m2  General appearance: alert, cooperative and appears stated age Ears: normal TM's and external ear canals both ears Throat: lips, mucosa, and tongue normal; teeth and  gums normal Neck: no adenopathy, no carotid bruit, supple, symmetrical, trachea midline and thyroid not enlarged, symmetric, no tenderness/mass/nodules Back: symmetric, no curvature. ROM normal. No CVA tenderness. Lungs: clear to auscultation bilaterally Heart: regular rate and rhythm, S1, S2 normal, no murmur, click, rub or gallop Abdomen: soft, non-tender; bowel sounds normal; no masses,  no organomegaly Pulses: 2+ and symmetric Skin: Skin color, texture, turgor normal. No rashes or lesions Lymph nodes: Cervical, supraclavicular, and axillary nodes normal.  Assessment and Plan  Cardiomyopathy Well compensated currently . No changes to medications  Bladder outlet obstruction Improved with TURP and rapaflo .   Hyperglycemia He has been screened with a hgba1c de to recent elevateg glucose and has no sings of DM.  a1c 5.6   Hypertension Well controlled on current regimen. Renal function stable, no changes today.  Orthostasis improved with medication adjustments with no corresponding wt gain.    Updated Medication List Outpatient Encounter Prescriptions as of 06/27/2012  Medication Sig Dispense Refill  . amiodarone (PACERONE) 100 MG tablet Take 1 tablet (100 mg total) by mouth daily.  90 tablet  3  . atorvastatin (LIPITOR) 80 MG tablet Take 1 tablet (80 mg total) by mouth daily.  90 tablet  1  . carvedilol (COREG) 12.5 MG tablet Take 2 tablets (25 mg total) by mouth 2 (two) times daily with a meal.  180 tablet  3  . Coenzyme Q10-Levocarnitine (CO Q-10 PLUS PO) Take 1 tablet by mouth daily.       . dabigatran (PRADAXA) 150 MG CAPS Take 1 capsule (150 mg total) by mouth 2 (two) times daily.  180 capsule  3  . finasteride (PROSCAR) 5 MG tablet Take 5 mg by mouth daily.      . fish oil-omega-3 fatty acids 1000 MG capsule Take 2 g by mouth 3 (three) times daily. Taking 360 daily      . furosemide (LASIX) 20 MG tablet Take 1 tablet (20 mg total) by mouth daily.  90 tablet  1  .  lisinopril (PRINIVIL,ZESTRIL) 20 MG tablet Take 1 tablet (20 mg total) by mouth daily.  30 tablet  3  . omeprazole (PRILOSEC) 20 MG capsule Take 1 capsule (20 mg total) by mouth daily as needed. Acid reflux.  30 capsule  5  . spironolactone (ALDACTONE) 12.5 mg TABS Take 0.5 tablets (12.5 mg total) by mouth daily.  30 tablet  5  . Tamsulosin HCl (FLOMAX) 0.4 MG CAPS Take 0.4 mg by mouth daily.       No facility-administered encounter medications on file as of 06/27/2012.

## 2012-06-27 NOTE — Patient Instructions (Addendum)
Go get a TDaP vaccine  As soon as possible   Return in 6 months,  Sooner if your labs indicate adult onset diabetes

## 2012-06-29 ENCOUNTER — Encounter: Payer: Self-pay | Admitting: Internal Medicine

## 2012-07-01 NOTE — Assessment & Plan Note (Signed)
He has been screened with a hgba1c de to recent elevateg glucose and has no sings of DM.  a1c 5.6

## 2012-07-01 NOTE — Assessment & Plan Note (Signed)
Well controlled on current regimen. Renal function stable, no changes today. 

## 2012-07-01 NOTE — Assessment & Plan Note (Addendum)
Well compensated currently . No changes to medications

## 2012-07-01 NOTE — Assessment & Plan Note (Signed)
Improved with TURP and rapaflo .

## 2012-07-01 NOTE — Assessment & Plan Note (Signed)
improved with medication adjustments with no corresponding wt gain.

## 2012-08-10 ENCOUNTER — Other Ambulatory Visit: Payer: Self-pay | Admitting: Internal Medicine

## 2012-08-13 ENCOUNTER — Encounter: Payer: Medicare PPO | Admitting: *Deleted

## 2012-08-16 ENCOUNTER — Other Ambulatory Visit: Payer: Self-pay | Admitting: Internal Medicine

## 2012-08-16 NOTE — Telephone Encounter (Signed)
Med filled.  

## 2012-08-20 ENCOUNTER — Encounter: Payer: Self-pay | Admitting: *Deleted

## 2012-08-24 ENCOUNTER — Ambulatory Visit (INDEPENDENT_AMBULATORY_CARE_PROVIDER_SITE_OTHER): Payer: Medicare PPO | Admitting: *Deleted

## 2012-08-24 DIAGNOSIS — I5022 Chronic systolic (congestive) heart failure: Secondary | ICD-10-CM

## 2012-08-24 DIAGNOSIS — Z95810 Presence of automatic (implantable) cardiac defibrillator: Secondary | ICD-10-CM

## 2012-08-24 DIAGNOSIS — I429 Cardiomyopathy, unspecified: Secondary | ICD-10-CM

## 2012-08-24 DIAGNOSIS — I428 Other cardiomyopathies: Secondary | ICD-10-CM

## 2012-08-25 ENCOUNTER — Encounter: Payer: Self-pay | Admitting: Internal Medicine

## 2012-08-25 ENCOUNTER — Other Ambulatory Visit: Payer: Self-pay

## 2012-09-03 LAB — REMOTE ICD DEVICE
AL AMPLITUDE: 2 mv
AL IMPEDENCE ICD: 513 Ohm
ATRIAL PACING ICD: 39.91 pct
CHARGE TIME: 8.428 s
RV LEAD IMPEDENCE ICD: 513 Ohm
RV LEAD THRESHOLD: 0.375 V
TOT-0001: 1
TOT-0002: 0
TOT-0006: 20130502000000
TZAT-0001ATACH: 1
TZAT-0001ATACH: 2
TZAT-0001FASTVT: 1
TZAT-0002ATACH: NEGATIVE
TZAT-0002ATACH: NEGATIVE
TZAT-0002FASTVT: NEGATIVE
TZAT-0011SLOWVT: 10 ms
TZAT-0012ATACH: 150 ms
TZAT-0012FASTVT: 170 ms
TZAT-0012SLOWVT: 170 ms
TZAT-0018ATACH: NEGATIVE
TZAT-0018ATACH: NEGATIVE
TZAT-0018SLOWVT: NEGATIVE
TZAT-0019ATACH: 6 V
TZAT-0019SLOWVT: 8 V
TZAT-0020ATACH: 1.5 ms
TZAT-0020FASTVT: 1.5 ms
TZAT-0020SLOWVT: 1.5 ms
TZON-0003ATACH: 350 ms
TZON-0004VSLOWVT: 32
TZON-0005SLOWVT: 12
TZST-0001FASTVT: 3
TZST-0001FASTVT: 4
TZST-0001FASTVT: 5
TZST-0001SLOWVT: 3
TZST-0001SLOWVT: 4
TZST-0001SLOWVT: 6
TZST-0002ATACH: NEGATIVE
TZST-0002ATACH: NEGATIVE
TZST-0002FASTVT: NEGATIVE
TZST-0002FASTVT: NEGATIVE
TZST-0003SLOWVT: 35 J
VENTRICULAR PACING ICD: 4.03 pct
VF: 0

## 2012-10-04 ENCOUNTER — Encounter: Payer: Self-pay | Admitting: *Deleted

## 2012-10-22 ENCOUNTER — Other Ambulatory Visit: Payer: Self-pay | Admitting: Cardiovascular Disease

## 2012-10-23 ENCOUNTER — Other Ambulatory Visit: Payer: Self-pay | Admitting: *Deleted

## 2012-10-23 MED ORDER — OMEPRAZOLE 20 MG PO CPDR: 20.0000 mg | DELAYED_RELEASE_CAPSULE | Freq: Every day | ORAL | Status: DC | PRN

## 2012-10-23 NOTE — Telephone Encounter (Signed)
Refilled Omeprazole sent to [facility].

## 2012-10-25 ENCOUNTER — Telehealth: Payer: Self-pay | Admitting: Internal Medicine

## 2012-10-25 NOTE — Telephone Encounter (Signed)
New Prob    Wife states husband is out of town and does not have a phone jack in his room. Would like to speak to nurse regarding this.

## 2012-10-25 NOTE — Telephone Encounter (Signed)
Spoke with wife in regards to transmitter not being hooked up due to not having phone jack. Pt is not scheduled for transmission until 12-03-12 and wife is aware of this.

## 2012-11-26 ENCOUNTER — Other Ambulatory Visit: Payer: Self-pay | Admitting: *Deleted

## 2012-11-26 DIAGNOSIS — I4891 Unspecified atrial fibrillation: Secondary | ICD-10-CM

## 2012-11-26 DIAGNOSIS — R0602 Shortness of breath: Secondary | ICD-10-CM

## 2012-11-26 MED ORDER — SPIRONOLACTONE 12.5 MG HALF TABLET: 12.5000 mg | ORAL_TABLET | Freq: Every day | ORAL | Status: DC

## 2012-11-26 NOTE — Telephone Encounter (Signed)
Refilled Spironolactone sent to [facility].

## 2012-12-03 ENCOUNTER — Ambulatory Visit (INDEPENDENT_AMBULATORY_CARE_PROVIDER_SITE_OTHER): Payer: Medicare PPO | Admitting: *Deleted

## 2012-12-03 ENCOUNTER — Encounter: Payer: Self-pay | Admitting: Internal Medicine

## 2012-12-03 DIAGNOSIS — I429 Cardiomyopathy, unspecified: Secondary | ICD-10-CM

## 2012-12-03 DIAGNOSIS — I428 Other cardiomyopathies: Secondary | ICD-10-CM

## 2012-12-03 DIAGNOSIS — Z95810 Presence of automatic (implantable) cardiac defibrillator: Secondary | ICD-10-CM

## 2012-12-03 DIAGNOSIS — I5022 Chronic systolic (congestive) heart failure: Secondary | ICD-10-CM

## 2012-12-10 LAB — REMOTE ICD DEVICE
AL AMPLITUDE: 1.1 mv
AL IMPEDENCE ICD: 475 Ohm
AL THRESHOLD: 1.25 V
ATRIAL PACING ICD: 23.31 pct
BAMS-0001: 170 {beats}/min
BATTERY VOLTAGE: 3.133 V
FVT: 0
RV LEAD AMPLITUDE: 6.9 mv
RV LEAD IMPEDENCE ICD: 532 Ohm
RV LEAD THRESHOLD: 0.5 V
TOT-0006: 20130502000000
TZAT-0001ATACH: 1
TZAT-0001ATACH: 3
TZAT-0002ATACH: NEGATIVE
TZAT-0002ATACH: NEGATIVE
TZAT-0004SLOWVT: 8
TZAT-0011SLOWVT: 10 ms
TZAT-0012ATACH: 150 ms
TZAT-0012FASTVT: 170 ms
TZAT-0012SLOWVT: 170 ms
TZAT-0018ATACH: NEGATIVE
TZAT-0018ATACH: NEGATIVE
TZAT-0018FASTVT: NEGATIVE
TZAT-0019ATACH: 6 V
TZAT-0019ATACH: 6 V
TZAT-0019ATACH: 6 V
TZAT-0019FASTVT: 8 V
TZAT-0019SLOWVT: 8 V
TZAT-0020ATACH: 1.5 ms
TZAT-0020FASTVT: 1.5 ms
TZAT-0020SLOWVT: 1.5 ms
TZON-0003SLOWVT: 300 ms
TZST-0001ATACH: 5
TZST-0001ATACH: 6
TZST-0001FASTVT: 2
TZST-0001FASTVT: 5
TZST-0001FASTVT: 6
TZST-0001SLOWVT: 3
TZST-0001SLOWVT: 4
TZST-0001SLOWVT: 6
TZST-0002ATACH: NEGATIVE
TZST-0002ATACH: NEGATIVE
TZST-0002ATACH: NEGATIVE
TZST-0002FASTVT: NEGATIVE
TZST-0002FASTVT: NEGATIVE
TZST-0002FASTVT: NEGATIVE
TZST-0003SLOWVT: 25 J
TZST-0003SLOWVT: 35 J
VF: 0

## 2012-12-24 ENCOUNTER — Encounter: Payer: Self-pay | Admitting: Internal Medicine

## 2013-01-02 ENCOUNTER — Ambulatory Visit (INDEPENDENT_AMBULATORY_CARE_PROVIDER_SITE_OTHER): Payer: Medicare PPO | Admitting: Internal Medicine

## 2013-01-02 ENCOUNTER — Encounter: Payer: Self-pay | Admitting: Internal Medicine

## 2013-01-02 ENCOUNTER — Encounter: Payer: Self-pay | Admitting: *Deleted

## 2013-01-02 VITALS — BP 110/68 | HR 82 | Temp 97.7°F | Resp 14 | Wt 220.0 lb

## 2013-01-02 DIAGNOSIS — R5381 Other malaise: Secondary | ICD-10-CM

## 2013-01-02 DIAGNOSIS — I10 Essential (primary) hypertension: Secondary | ICD-10-CM

## 2013-01-02 DIAGNOSIS — N320 Bladder-neck obstruction: Secondary | ICD-10-CM

## 2013-01-02 DIAGNOSIS — Z79899 Other long term (current) drug therapy: Secondary | ICD-10-CM

## 2013-01-02 DIAGNOSIS — R7309 Other abnormal glucose: Secondary | ICD-10-CM

## 2013-01-02 DIAGNOSIS — R972 Elevated prostate specific antigen [PSA]: Secondary | ICD-10-CM

## 2013-01-02 DIAGNOSIS — R739 Hyperglycemia, unspecified: Secondary | ICD-10-CM

## 2013-01-02 DIAGNOSIS — R251 Tremor, unspecified: Secondary | ICD-10-CM

## 2013-01-02 DIAGNOSIS — E785 Hyperlipidemia, unspecified: Secondary | ICD-10-CM

## 2013-01-02 DIAGNOSIS — G4733 Obstructive sleep apnea (adult) (pediatric): Secondary | ICD-10-CM

## 2013-01-02 DIAGNOSIS — R259 Unspecified abnormal involuntary movements: Secondary | ICD-10-CM

## 2013-01-02 LAB — LIPID PANEL
HDL: 38.5 mg/dL — ABNORMAL LOW (ref >39.00)
Total CHOL/HDL Ratio: 4
Triglycerides: 115 mg/dL (ref 0.0–149.0)

## 2013-01-02 MED ORDER — TETANUS-DIPHTH-ACELL PERTUSSIS 5-2.5-18.5 LF-MCG/0.5 IM SUSP: 0.5000 mL | Freq: Once | INTRAMUSCULAR | Status: DC

## 2013-01-02 NOTE — Progress Notes (Signed)
Patient ID: Chris Price, male   DOB: [birth year], 72 y.o.   MRN: [id number]     Patient Active Problem List   Diagnosis Date Noted  . OSA on CPAP 01/03/2013  . Hyperlipidemia LDL goal < 70 03/22/2012  . Bladder outlet obstruction 12/25/2011  . Hyperglycemia 12/23/2011  . Essential tremor 12/22/2011  . Paroxysmal atrial fibrillation 11/07/2011  . Hypertension   . Hearing loss in right ear 09/20/2011  . Elevated PSA, less than 10 ng/ml 09/20/2011  . ICD -Medtronic 09/09/2011  . Chronic systolic heart failure 08/25/2011  . IVCD (intraventricular conduction defect)   . Cardiomyopathy   . Coronary artery disease 08/15/2011    Subjective:  CC:   Chief Complaint  Patient presents with  . Follow-up    6 month    HPI:   Chris Lagos O'Brienis a 72 y.o. male who presents for Six-month followup on chronic conditions including hyperlipidemia hypertension, BPH with outlet obstructive symptoms, and coronary artery disease with ischemic cardiomyopathy. . He has had no episodes of chest pain or shortness of breath. His weight is stable. He has developed unintentional tremor which his wife notices getting worse and is requesting treatment for it. The tremor involves both hands and does not occur at rest. There is no accompanying head tremor. He has had no loss of balance or trouble walking. History daily regular exercise [place].    Past Medical History  Diagnosis Date  . Lumbar spinal stenosis   . Degenerative disk disease     lumbar  . Disc herniation   . Hyperlipidemia   . Cardiomyopathy -nonischemic     moderate to severe EF 25-30% March 2013  . Chronic systolic heart failure 08/25/2011  . ICD (implantable cardiac defibrillator) in place   . CHF (congestive heart failure)   . Sleep apnea     uses cpap  . GERD (gastroesophageal reflux disease)   . Neuromuscular disorder     neauropathy   / siatica  . Coronary artery disease 08/15/2011    Moderate  nonobstructive CAD with 60% mid LAD, 60% proximal RCA and 30 % stenossis left circumflex.  . Hypertension   . Atrial fibrillation   . IVCD (intraventricular conduction defect)     Nonspecific QRS duration 122  . Persistent atrial fibrillation 11/07/2011    Past Surgical History  Procedure Laterality Date  . Lumbar laminectomy      L2 through S1 with wide decompression of the thecal sac and nerve roots.  . Card[facility]version  08/2011    [facility]  . Cardiac catheterization  08/15/2011    [facility]   . Hernia repair    . Icd  09/08/2011  . Back surgery    . Cooled thermotherapy  2007    [facility], complicated by incontinence  . Prostate biopsy  2013       The following portions of the patient's history were reviewed and updated as appropriate: Allergies, current medications, and problem list.    Review of Systems:   12 Pt  review of systems was negative except those addressed in the HPI,     History   Social History  . Marital Status: Married    Spouse Name: N/A    Number of Children: N/A  . Years of Education: N/A   Occupational History  . Not on file.   Social History Main Topics  . Smoking status: Never Smoker   . Smokeless tobacco: Never Used  . Alcohol Use: No  .  Drug Use: No  . Sexual Activity: Not Currently   Other Topics Concern  . Not on file   Social History Narrative  . No narrative on file    Objective:  Filed Vitals:   01/02/13 0831  BP: 110/68  Pulse: 82  Temp: 97.7 F (36.5 C)  Resp: 14     General appearance: alert, cooperative and appears stated age Ears: normal TM's and external ear canals both ears Throat: lips, mucosa, and tongue normal; teeth and gums normal Neck: no adenopathy, no carotid bruit, supple, symmetrical, trachea midline and thyroid not enlarged, symmetric, no tenderness/mass/nodules Back: symmetric, no curvature. ROM normal. No CVA tenderness. Lungs: clear to auscultation bilaterally Heart: regular rate and rhythm, S1, S2 normal,  no murmur, click, rub or gallop Abdomen: soft, non-tender; bowel sounds normal; no masses,  no organomegaly Pulses: 2+ and symmetric Skin: Skin color, texture, turgor normal. No rashes or lesions Lymph nodes: Cervical, supraclavicular, and axillary nodes normal.  Assessment and Plan:  Hyperglycemia a1c was < 6.0  Low GI diet recommended today for wt loss of 22 lbs initially  Essential tremor previously mentioned to dr Graciela Husbands.  Patient states he was taken off  a medication bc of the tremor but not sure what.  He has no signs of parkinsonism specifically no balance issues no trouble walking on her tremor and no tremor at rest. Discussed with wife that the intention tremor is treated with beta blockers initially , which patient is already taking for his heart disease. We'll add low-dose primidone and screen for Wilson's disease, Heavy metal toxicity  and thyroid disorders.   Hypertension Well controlled on current regimen. Renal function stable, no changes today.  Hyperlipidemia LDL goal < 70 He is on a high intensity statin and calculated LDL is 110 despite 80 mg of Lipitor.  OSA on CPAP He is compliant with use of CPAP per  recent overnight report  Elevated PSA, less than 10 ng/ml He was referred to [facility] by Dr. Judie Grieve cope in late May for elevated PSA in the setting of BPH for prior TURP procedure.  Bladder outlet obstruction Symptoms are currently controlled with medication. He is status post prior TURP procedure. Renal function is stable.   A total of 40 minutes was spent with patient more than half of which was spent in counseling, reviewing records from other prviders and coordination of care.  Updated Medication List Outpatient Encounter Prescriptions as of 01/02/2013  Medication Sig Dispense Refill  . amiodarone (PACERONE) 100 MG tablet Take 1 tablet (100 mg total) by mouth daily.  90 tablet  3  . atorvastatin (LIPITOR) 80 MG tablet TAKE 1 TABLET EVERY DAY  90 tablet  1  .  carvedilol (COREG) 12.5 MG tablet Take 2 tablets (25 mg total) by mouth 2 (two) times daily with a meal.  180 tablet  3  . Coenzyme Q10-Levocarnitine (CO Q-10 PLUS PO) Take 1 tablet by mouth daily.       . dabigatran (PRADAXA) 150 MG CAPS Take 1 capsule (150 mg total) by mouth 2 (two) times daily.  180 capsule  3  . fish oil-omega-3 fatty acids 1000 MG capsule Take 2 g by mouth 3 (three) times daily. Taking 360 daily      . furosemide (LASIX) 20 MG tablet TAKE 1 TABLET EVERY DAY  90 tablet  PRN  . lisinopril (PRINIVIL,ZESTRIL) 20 MG tablet Take 1 tablet (20 mg total) by mouth daily.  30 tablet  3  . omeprazole (  PRILOSEC) 20 MG capsule Take 1 capsule (20 mg total) by mouth daily as needed. Acid reflux.  90 capsule  3  . spironolactone (ALDACTONE) 12.5 mg TABS Take 0.5 tablets (12.5 mg total) by mouth daily.  45 tablet  3  . Tamsulosin HCl (FLOMAX) 0.4 MG CAPS Take 0.4 mg by mouth daily.      . finasteride (PROSCAR) 5 MG tablet Take 5 mg by mouth daily.      . primidone (MYSOLINE) 50 MG tablet Take 0.5 tablets (25 mg total) by mouth at bedtime. And increase weekly by 1/2 tablet  60 tablet  0  . TDaP (BOOSTRIX) 5-2.5-18.5 LF-MCG/0.5 injection Inject 0.5 mLs into the muscle once.  0.5 mL  0   No facility-administered encounter medications on file as of 01/02/2013.

## 2013-01-02 NOTE — Assessment & Plan Note (Signed)
a1c was < 6.0  Low GI diet recommended today for wt loss of 22 lbs initially

## 2013-01-02 NOTE — Patient Instructions (Addendum)
You need to have your TDaP vaccine repeated (unless you have had it in the last 10 years).  [facility] does not pay for it  I will review the available medications for tremor and decide which one is safest for you to try   I want you to lose 22 lb over the next 6 months.  This will bring your BMI down  To 31 (our goal is < 30 )   This is my version of a  "Low GI"  Diet:  It will still lower your blood sugars and allow you to lose 4 to 8  lbs  per month if you follow it carefully.  Your goal with exercise is a minimum of 30 minutes of aerobic exercise 5 days per week (Walking does not count once it becomes easy!)    All of the foods can be found at grocery stores and in bulk at [employer].  The Atkins protein bars and shakes are available in more varieties at Target, WalMart and [employer] Foods.     7 AM Breakfast:  Choose from the following:  Low carbohydrate Protein  Shakes (I recommend the EAS AdvantEdge "Carb Control" shakes  Or the low carb shakes by Atkins.    2.5 carbs   Arnold's "Sandwhich Thin"toasted  w/ peanut butter (no jelly: about 20 net carbs  "Bagel Thin" with cream cheese and salmon: about 20 carbs   a scrambled egg/bacon/cheese burrito made with Mission's "carb balance" whole wheat tortilla  (about 10 net carbs )   Avoid cereal and bananas, oatmeal and cream of wheat and grits. They are loaded with carbohydrates!   10 AM: high protein snack  Protein bar by Atkins (the snack size, under 200 cal, usually < 6 net carbs).    A stick of cheese:  Around 1 carb,  100 cal     Dannon Light n Fit [place] Yogurt  (80 cal, 8 carbs)  Other so called "protein bars" and Greek yogurts tend to be loaded with carbohydrates.  Remember, in food advertising, the word "energy" is synonymous for " carbohydrate."  Lunch:   A Sandwich using the bread choices listed, Can use any  Eggs,  lunchmeat, grilled meat or canned tuna), avocado, regular mayo/mustard  and cheese.  A Salad using blue cheese, ranch,   Goddess or vinagrette,  No croutons or "confetti" and no "candied nuts" but regular nuts OK.   No pretzels or chips.  Pickles and miniature sweet peppers are a good low carb alternative that provide a "crunch"  The bread is the only source of carbohydrate in a sandwich and  can be decreased by trying some of these alternatives to traditional loaf bread  Joseph's makes a pita bread and a flat bread that are 50 cal and 4 net carbs available at BJs and WalMart.  This can be toasted to use with hummous as well  Toufayan makes a low carb flatbread that's 100 cal and 9 net carbs available at [employer] and [employer] makes 2 sizes of  Low carb whole wheat tortilla  (The large one is 210 cal and 6 net carbs) Avoid "Low fat dressings, as well as Reyne Dumas and [address] dressings They are loaded with sugar!   3 PM/ Mid day  Snack:  Consider  1 ounce of  almonds, walnuts, pistachios, pecans, peanuts,  Macadamia nuts or a nut medley.  Avoid "granola"; the dried cranberries and raisins are loaded with carbohydrates. Mixed nuts as long  as there are no raisins,  cranberries or dried fruit.     6 PM  Dinner:     Meat/fowl/fish with a green salad, and either broccoli, cauliflower, green beans, spinach, brussel sprouts or  Lima beans. DO NOT BREAD THE PROTEIN!!      There is a low carb pasta by Dreamfield's that is acceptable and tastes great: only 5 digestible carbs/serving.( All grocery stores but BJs carry it )  Try Kai Levins Angelo's chicken piccata or chicken or eggplant parm over low carb pasta.(Lowes and BJs)   Clifton Custard Sanchez's "Carnitas" (pulled pork, no sauce,  0 carbs) or his beef pot roast to make a dinner burrito (at BJ's)  Pesto over low carb pasta (bj's sells a good quality pesto in the center refrigerated section of the deli   Whole wheat pasta is still full of digestible carbs and  Not as low in glycemic index as Dreamfield's.   Brown rice is still rice,  So skip the rice and noodles if you  eat [place] or [place] (or at least limit to 1/2 cup)  9 PM snack :   Breyer's "low carb" fudgsicle or  ice cream bar (Carb Smart line), or  Weight Watcher's ice cream bar , or another "no sugar added" ice cream;  a serving of fresh berries/cherries with whipped cream   Cheese or DANNON'S LlGHT N [place] YOGURT  Avoid bananas, pineapple, grapes  and watermelon on a regular basis because they are high in sugar.  THINK OF THEM AS DESSERT  Remember that snack Substitutions should be less than 10 NET carbs per serving and meals < 20 carbs. Remember to subtract fiber grams to get the "net carbs."

## 2013-01-02 NOTE — Assessment & Plan Note (Addendum)
previously mentioned to dr Graciela Husbands.  Patient states he was taken off  a medication bc of the tremor but not sure what.  He has no signs of parkinsonism specifically no balance issues no trouble walking on her tremor and no tremor at rest. Discussed with wife that the intention tremor is treated with beta blockers initially , which patient is already taking for his heart disease. We'll add low-dose primidone and screen for Wilson's disease, Heavy metal toxicity  and thyroid disorders.

## 2013-01-03 ENCOUNTER — Encounter: Payer: Self-pay | Admitting: Internal Medicine

## 2013-01-03 DIAGNOSIS — G4733 Obstructive sleep apnea (adult) (pediatric): Secondary | ICD-10-CM | POA: Insufficient documentation

## 2013-01-03 LAB — COMPREHENSIVE METABOLIC PANEL
ALT: 27 U/L (ref 0–53)
Alkaline Phosphatase: 46 U/L (ref 39–117)
CO2: 30 mEq/L (ref 19–32)
Creatinine, Ser: 0.9 mg/dL (ref 0.4–1.5)
GFR: 87.68 mL/min (ref >60.00)
Total Bilirubin: 1 mg/dL (ref 0.3–1.2)

## 2013-01-03 MED ORDER — PRIMIDONE 50 MG PO TABS: 25.0000 mg | ORAL_TABLET | Freq: Every day | ORAL | Status: DC

## 2013-01-03 NOTE — Assessment & Plan Note (Signed)
Symptoms are currently controlled with medication. He is status post prior TURP procedure. Renal function is stable.

## 2013-01-03 NOTE — Assessment & Plan Note (Signed)
He was referred to [facility] by Dr. Judie Grieve cope in late May for elevated PSA in the setting of BPH for prior TURP procedure.

## 2013-01-03 NOTE — Assessment & Plan Note (Signed)
Well controlled on current regimen. Renal function stable, no changes today. 

## 2013-01-03 NOTE — Assessment & Plan Note (Signed)
He is compliant with use of CPAP per  recent overnight report

## 2013-01-03 NOTE — Assessment & Plan Note (Signed)
He is on a high intensity statin and calculated LDL is 110 despite 80 mg of Lipitor.

## 2013-01-17 ENCOUNTER — Other Ambulatory Visit (INDEPENDENT_AMBULATORY_CARE_PROVIDER_SITE_OTHER): Payer: Medicare PPO

## 2013-01-17 ENCOUNTER — Telehealth: Payer: Self-pay | Admitting: *Deleted

## 2013-01-17 DIAGNOSIS — R259 Unspecified abnormal involuntary movements: Secondary | ICD-10-CM

## 2013-01-17 DIAGNOSIS — R251 Tremor, unspecified: Secondary | ICD-10-CM

## 2013-01-17 NOTE — Telephone Encounter (Signed)
Patient came in for labs today and reported while here was told script for prednisone would be called to pharmacy, I could not find anything about prednisone in chart. Please advise.

## 2013-01-17 NOTE — Telephone Encounter (Signed)
Closed by mistake please see note below.

## 2013-01-18 ENCOUNTER — Encounter: Payer: Self-pay | Admitting: Cardiovascular Disease

## 2013-01-18 ENCOUNTER — Ambulatory Visit (INDEPENDENT_AMBULATORY_CARE_PROVIDER_SITE_OTHER): Payer: Medicare PPO | Admitting: Cardiovascular Disease

## 2013-01-18 ENCOUNTER — Telehealth: Payer: Self-pay | Admitting: Internal Medicine

## 2013-01-18 VITALS — BP 90/60 | HR 90 | Ht 66.0 in | Wt 219.2 lb

## 2013-01-18 DIAGNOSIS — I4891 Unspecified atrial fibrillation: Secondary | ICD-10-CM

## 2013-01-18 DIAGNOSIS — I480 Paroxysmal atrial fibrillation: Secondary | ICD-10-CM

## 2013-01-18 DIAGNOSIS — I5022 Chronic systolic (congestive) heart failure: Secondary | ICD-10-CM

## 2013-01-18 DIAGNOSIS — I4819 Other persistent atrial fibrillation: Secondary | ICD-10-CM

## 2013-01-18 MED ORDER — PRIMIDONE 50 MG PO TABS: 25.0000 mg | ORAL_TABLET | Freq: Every day | ORAL | Status: DC

## 2013-01-18 NOTE — Progress Notes (Signed)
HPI  Chris Price is a pleasant 72 year old male who is here today for a followup visit regarding chronic systolic heart failure due to nonischemic cardiomyopathy, atrial fibrillation and moderate nonobstructive coronary artery disease.  He was hospitalized in April of last year with heart failure exacerbation. Ejection fraction was 27%. He underwent cardiac catheterization which showed moderate nonobstructive coronary artery disease. He improved significantly with Lasix and was placed on heart failure treatment. Amiodarone was started for atrial fibrillation he was anticoagulated with Pradaxa. A dual-chamber ICD was placed by Dr. Graciela Husbands.  He has been doing well and denies any chest pain or worsening dyspnea. No PND or orthopnea. He does not complaining of palpitations. He is supposed to be on amiodarone for atrial fibrillation. However, he is no longer on this medication for unclear reasons. His wife thinks that it was stopped due to the tremors but I don't see documentation of this. He is noted to be in atrial fibrillation today. Device interrogation in July showed that he was in atrial fibrillation 52% of the time.  No Known Allergies   Current Outpatient Prescriptions on File Prior to Visit  Medication Sig Dispense Refill  . atorvastatin (LIPITOR) 80 MG tablet TAKE 1 TABLET EVERY DAY  90 tablet  1  . carvedilol (COREG) 12.5 MG tablet Take 2 tablets (25 mg total) by mouth 2 (two) times daily with a meal.  180 tablet  3  . Coenzyme Q10-Levocarnitine (CO Q-10 PLUS PO) Take 1 tablet by mouth daily.       . dabigatran (PRADAXA) 150 MG CAPS Take 1 capsule (150 mg total) by mouth 2 (two) times daily.  180 capsule  3  . finasteride (PROSCAR) 5 MG tablet Take 5 mg by mouth daily.      . fish oil-omega-3 fatty acids 1000 MG capsule Take 2 g by mouth 3 (three) times daily. Taking 360 daily      . furosemide (LASIX) 20 MG tablet TAKE 1 TABLET EVERY DAY  90 tablet  PRN  . lisinopril (PRINIVIL,ZESTRIL) 20 MG  tablet Take 1 tablet (20 mg total) by mouth daily.  30 tablet  3  . omeprazole (PRILOSEC) 20 MG capsule Take 1 capsule (20 mg total) by mouth daily as needed. Acid reflux.  90 capsule  3  . primidone (MYSOLINE) 50 MG tablet Take 0.5 tablets (25 mg total) by mouth at bedtime. And increase weekly by 1/2 tablet  60 tablet  0  . spironolactone (ALDACTONE) 12.5 mg TABS Take 0.5 tablets (12.5 mg total) by mouth daily.  45 tablet  3  . Tamsulosin HCl (FLOMAX) 0.4 MG CAPS Take 0.4 mg by mouth daily.      . TDaP (BOOSTRIX) 5-2.5-18.5 LF-MCG/0.5 injection Inject 0.5 mLs into the muscle once.  0.5 mL  0   No current facility-administered medications on file prior to visit.     Past Medical History  Diagnosis Date  . Lumbar spinal stenosis   . Degenerative disk disease     lumbar  . Disc herniation   . Hyperlipidemia   . Cardiomyopathy -nonischemic     moderate to severe EF 25-30% March 2013  . Chronic systolic heart failure 08/25/2011  . ICD (implantable cardiac defibrillator) in place   . CHF (congestive heart failure)   . Sleep apnea     uses cpap  . GERD (gastroesophageal reflux disease)   . Neuromuscular disorder     neauropathy   / siatica  . Coronary artery disease 08/15/2011  Moderate nonobstructive CAD with 60% mid LAD, 60% proximal RCA and 30 % stenossis left circumflex.  . Hypertension   . Atrial fibrillation   . IVCD (intraventricular conduction defect)     Nonspecific QRS duration 122  . Persistent atrial fibrillation 11/07/2011     Past Surgical History  Procedure Laterality Date  . Lumbar laminectomy      L2 through S1 with wide decompression of the thecal sac and nerve roots.  . [facility]version  08/2011    [facility]  . Cardiac catheterization  08/15/2011    [facility]   . Hernia repair    . Icd  09/08/2011  . Back surgery    . Cooled thermotherapy  2007    [facility], complicated by incontinence  . Prostate biopsy  2013     Family History  Problem Relation Age of Onset  . Heart  attack Father   . Heart attack Brother      History   Social History  . Marital Status: Married    Spouse Name: N/A    Number of Children: N/A  . Years of Education: N/A   Occupational History  . Not on file.   Social History Main Topics  . Smoking status: Never Smoker   . Smokeless tobacco: Never Used  . Alcohol Use: No  . Drug Use: No  . Sexual Activity: Not Currently   Other Topics Concern  . Not on file   Social History Narrative  . No narrative on file     PHYSICAL EXAM   BP 90/60  Pulse 90  Ht 5\' 6"  (1.676 m)  Wt 219 lb 4 oz (99.451 kg)  BMI 35.4 kg/m2 Constitutional: He is oriented to person, place, and time. He appears well-developed and well-nourished. No distress.  HENT: No nasal discharge.  Head: Normocephalic and atraumatic.  Eyes: Pupils are equal and round. Right eye exhibits no discharge. Left eye exhibits no discharge.  Neck: Normal range of motion. Neck supple. No JVD present. No thyromegaly present.  Cardiovascular: Normal rate, irregular rhythm, normal heart sounds. Exam reveals no gallop and no friction rub. No murmur heard.  Pulmonary/Chest: Effort normal and breath sounds normal. No stridor. No respiratory distress. He has no wheezes. He has no rales. He exhibits no tenderness.  Abdominal: Soft. Bowel sounds are normal. He exhibits no distension. There is no tenderness. There is no rebound and no guarding.  Musculoskeletal: Normal range of motion. He exhibits no edema and no tenderness.  Neurological: He is alert and oriented to person, place, and time. Coordination normal.  Skin: Skin is warm and dry. No rash noted. He is not diaphoretic. No erythema. No pallor.  Psychiatric: He has a normal mood and affect. His behavior is normal. Judgment and thought content normal.       EKG: Atrial fibrillation  - occasional ectopic ventricular beat    -Right sided conduction defect and right axis -possible right ventricular hypertrophy or posterior  fascicular block  -Nonspecific QRS widening.   ABNORMAL    ASSESSMENT AND PLAN

## 2013-01-18 NOTE — Telephone Encounter (Signed)
Script was to go to [facility] not long term pharmacy so I resent script to [facility] as requested.

## 2013-01-18 NOTE — Assessment & Plan Note (Signed)
He is currently New York Heart Association class II. He appears to be euvolemic. He is on optimal medical therapy. Recent labs were reviewed which showed normal renal function and electrolytes.

## 2013-01-18 NOTE — Telephone Encounter (Signed)
He is mistaken,  The medication is primidone,  For his tremor,  And it was called in.,  See chart

## 2013-01-18 NOTE — Addendum Note (Signed)
Addended by: Dennie Bible on: 01/18/2013 05:05 PM   Modules accepted: Orders

## 2013-01-18 NOTE — Assessment & Plan Note (Signed)
The patient is currently in atrial fibrillation with controlled ventricular rate. He was noted and device interrogation in July to be in A. fib at least 50% of the time. The patient did not notice any worsening symptoms over the last 6 months. Thus, it might be reasonable to pursue rate control strategy. Maintaining sinus rhythm might be appropriate as well given his underlying cardiomyopathy and systolic heart failure. The circumstances of stopping amiodarone is not entirely clear to me. There is long-term toxicity of antiarrhythmic medication versus benefits of maintaining sinus rhythm has to be balanced. He has a followup appointment with Dr. Graciela Husbands next week and I would like him to discuss this with him and get his opinion. Continue long-term anticoagulation. Rate control was achieved currently with carvedilol.

## 2013-01-18 NOTE — Telephone Encounter (Signed)
Script sent  

## 2013-01-18 NOTE — Telephone Encounter (Signed)
Pt was seen recently and was supposed to have Prednisone sent over to [facility] and they have not received it yet ??

## 2013-01-18 NOTE — Patient Instructions (Addendum)
Continue same medications.  Keep your follow up appointment with Dr. Graciela Husbands.  Follow up in 6 months.

## 2013-01-21 ENCOUNTER — Other Ambulatory Visit (INDEPENDENT_AMBULATORY_CARE_PROVIDER_SITE_OTHER): Payer: Medicare PPO

## 2013-01-21 DIAGNOSIS — R259 Unspecified abnormal involuntary movements: Secondary | ICD-10-CM

## 2013-01-23 ENCOUNTER — Ambulatory Visit (INDEPENDENT_AMBULATORY_CARE_PROVIDER_SITE_OTHER): Payer: Medicare PPO | Admitting: Internal Medicine

## 2013-01-23 ENCOUNTER — Encounter: Payer: Self-pay | Admitting: Internal Medicine

## 2013-01-23 VITALS — BP 112/78 | HR 80 | Ht 66.0 in | Wt 222.5 lb

## 2013-01-23 DIAGNOSIS — I428 Other cardiomyopathies: Secondary | ICD-10-CM

## 2013-01-23 DIAGNOSIS — I4891 Unspecified atrial fibrillation: Secondary | ICD-10-CM

## 2013-01-23 DIAGNOSIS — G250 Essential tremor: Secondary | ICD-10-CM

## 2013-01-23 DIAGNOSIS — I5022 Chronic systolic (congestive) heart failure: Secondary | ICD-10-CM

## 2013-01-23 DIAGNOSIS — I429 Cardiomyopathy, unspecified: Secondary | ICD-10-CM

## 2013-01-23 DIAGNOSIS — Z95810 Presence of automatic (implantable) cardiac defibrillator: Secondary | ICD-10-CM

## 2013-01-23 DIAGNOSIS — I480 Paroxysmal atrial fibrillation: Secondary | ICD-10-CM

## 2013-01-23 LAB — ICD DEVICE OBSERVATION
BAMS-0001: 170 {beats}/min
BRDY-0002RV: 60 {beats}/min
BRDY-0003RV: 120 {beats}/min
FVT: 0
HV IMPEDENCE: 66 Ohm
PACEART VT: 0
RV LEAD AMPLITUDE: 8.25 mv
RV LEAD THRESHOLD: 0.5 V
TOT-0001: 1
TZAT-0001ATACH: 2
TZAT-0001FASTVT: 1
TZAT-0002ATACH: NEGATIVE
TZAT-0002ATACH: NEGATIVE
TZAT-0002ATACH: NEGATIVE
TZAT-0011SLOWVT: 10 ms
TZAT-0012ATACH: 150 ms
TZAT-0012SLOWVT: 170 ms
TZAT-0013SLOWVT: 3
TZAT-0018ATACH: NEGATIVE
TZAT-0018ATACH: NEGATIVE
TZAT-0019ATACH: 6 V
TZAT-0019ATACH: 6 V
TZAT-0019ATACH: 6 V
TZAT-0019SLOWVT: 8 V
TZAT-0020ATACH: 1.5 ms
TZAT-0020ATACH: 1.5 ms
TZAT-0020SLOWVT: 1.5 ms
TZON-0003SLOWVT: 300 ms
TZON-0003VSLOWVT: 360 ms
TZON-0004SLOWVT: 28
TZON-0004VSLOWVT: 32
TZST-0001ATACH: 5
TZST-0001FASTVT: 2
TZST-0001FASTVT: 3
TZST-0001FASTVT: 4
TZST-0001SLOWVT: 3
TZST-0001SLOWVT: 4
TZST-0002ATACH: NEGATIVE
TZST-0002FASTVT: NEGATIVE
TZST-0002FASTVT: NEGATIVE
TZST-0003SLOWVT: 35 J
TZST-0003SLOWVT: 35 J

## 2013-01-23 MED ORDER — LISINOPRIL 20 MG PO TABS: 10.0000 mg | ORAL_TABLET | Freq: Every day | ORAL | Status: DC

## 2013-01-23 NOTE — Assessment & Plan Note (Signed)
The tremor is no better off of amiodarone. He's been prescribed prmidone. We spent 15 minutes looking to drug interactions. The only cardiac drug interaction we saw was potentially decreasing effectiveness of carvedilol.

## 2013-01-23 NOTE — Progress Notes (Signed)
Patient Care Team: Sherlene Shams, MD as PCP - General (Internal Medicine)   HPI  Chris Price is a 72 y.o. male Seen in followup for an ICD implanted for primary prevention in nonischemic cardiomyopathy further complicated by sinus bradycardia-date of implant May 2013. He also has a history of atrial fibrillation for which he was managed with dabigitran and amiodarone. He developed a significant intention tremor prompting the down titration and subsequent discontinuation of his amiodarone  There have been no appreciable change in his tremor.  There has been a significant increase in his atrial fibrillation burden as detected by the device; however, the patient has noted no appreciable changes in exercise tolerance or edema. He has however noted some dizziness particularly with bending over and standing. His blood pressure was noted to be 97 in the office last time       Past Medical History  Diagnosis Date  . Lumbar spinal stenosis   . Degenerative disk disease     lumbar  . Disc herniation   . Hyperlipidemia   . Cardiomyopathy -nonischemic     moderate to severe EF 25-30% March 2013  . Chronic systolic heart failure 08/25/2011  . ICD (implantable cardiac defibrillator) in place   . CHF (congestive heart failure)   . Sleep apnea     uses cpap  . GERD (gastroesophageal reflux disease)   . Neuromuscular disorder     neauropathy   / siatica  . Coronary artery disease 08/15/2011    Moderate nonobstructive CAD with 60% mid LAD, 60% proximal RCA and 30 % stenossis left circumflex.  . Hypertension   . Atrial fibrillation   . IVCD (intraventricular conduction defect)     Nonspecific QRS duration 122  . Persistent atrial fibrillation 11/07/2011    Past Surgical History  Procedure Laterality Date  . Lumbar laminectomy      L2 through S1 with wide decompression of the thecal sac and nerve roots.  . Cardioversion  08/2011    [facility]  . Cardiac catheterization  08/15/2011    [facility]   .  Hernia repair    . Icd  09/08/2011  . Back surgery    . Cooled thermotherapy  2007    [facility]fe, complicated by incontinence  . Prostate biopsy  2013    Current Outpatient Prescriptions  Medication Sig Dispense Refill  . atorvastatin (LIPITOR) 80 MG tablet TAKE 1 TABLET EVERY DAY  90 tablet  1  . carvedilol (COREG) 12.5 MG tablet Take 2 tablets (25 mg total) by mouth 2 (two) times daily with a meal.  180 tablet  3  . Coenzyme Q10-Levocarnitine (CO Q-10 PLUS PO) Take 1 tablet by mouth daily.       . dabigatran (PRADAXA) 150 MG CAPS Take 1 capsule (150 mg total) by mouth 2 (two) times daily.  180 capsule  3  . finasteride (PROSCAR) 5 MG tablet Take 5 mg by mouth daily.      . fish oil-omega-3 fatty acids 1000 MG capsule Take 2 g by mouth 3 (three) times daily. Taking 360 daily      . furosemide (LASIX) 20 MG tablet TAKE 1 TABLET EVERY DAY  90 tablet  PRN  . lisinopril (PRINIVIL,ZESTRIL) 20 MG tablet Take 1 tablet (20 mg total) by mouth daily.  30 tablet  3  . omeprazole (PRILOSEC) 20 MG capsule Take 1 capsule (20 mg total) by mouth daily as needed. Acid reflux.  90 capsule  3  . spironolactone (ALDACTONE)  12.5 mg TABS Take 0.5 tablets (12.5 mg total) by mouth daily.  45 tablet  3  . Tamsulosin HCl (FLOMAX) 0.4 MG CAPS Take 0.4 mg by mouth daily.      . TDaP (BOOSTRIX) 5-2.5-18.5 LF-MCG/0.5 injection Inject 0.5 mLs into the muscle once.  0.5 mL  0  . primidone (MYSOLINE) 50 MG tablet Take 0.5 tablets (25 mg total) by mouth at bedtime. And increase weekly by 1/2 tablet  60 tablet  0   No current facility-administered medications for this visit.    No Known Allergies  Review of Systems negative except from HPI and PMH  Physical Exam BP 112/78  Pulse 80  Ht 5\' 6"  (1.676 m)  Wt 222 lb 8 oz (100.925 kg)  BMI 35.93 kg/m2 Well developed and well nourished in no acute distress HENT normal E scleral and icterus clear Neck Supple JVP flat; carotids brisk and full Clear to ausculation  Device  pocket well healed; without hematoma or erythema.  There is no tethering Regular rate and rhythm, no murmurs gallops or rub Soft with active bowel sounds No clubbing cyanosis none Edema Alert and oriented, grossly normal motor and sensory function; mild tremor Skin Warm and Dry    Assessment and  Plan

## 2013-01-23 NOTE — Assessment & Plan Note (Signed)
The patient's device was interrogated.  The information was reviewed. No changes were made in the programming.    

## 2013-01-23 NOTE — Patient Instructions (Addendum)
Your physician wants you to follow-up in: one year with Dr. Graciela Husbands. You will receive a reminder letter in the mail two months in advance. If you don't receive a letter, please call our office to schedule the follow-up appointment.  Your physician has recommended you make the following change in your medication:  1) Decrease Lisinopril to 10mg  daily

## 2013-01-23 NOTE — Assessment & Plan Note (Signed)
Chief euvolemic

## 2013-01-23 NOTE — Assessment & Plan Note (Signed)
As above Continued guideline directed medical therapy

## 2013-01-23 NOTE — Assessment & Plan Note (Signed)
He continues to have frequent paroxysms of atrial fibrillation with an overall increase his atrial fibrillation pertinence is the discontinuation of amiodarone. We elected discussion regarding the data regarding amiodarone therapy related to outcomes to rise from SCD-Heft and EMIAT  As there are no specific symptoms attributable to his atrial fibrillation in terms of exercise tolerance, I've suggested that we not resume it simply because of the room detected on the device. Some of his dizziness may be related to atrial fibrillation as might some of his low blood pressure. We will address that by decreasing his lisinopril  We also discussed alternative NOAC therapy. Currently apixaban has developed a doughnut hole program

## 2013-01-26 ENCOUNTER — Encounter: Payer: Self-pay | Admitting: Internal Medicine

## 2013-02-01 LAB — HEAVY METALS SCREEN, URINE: Lead, Urine (24 Hr): 1 mcg/L (ref <80)

## 2013-02-05 ENCOUNTER — Encounter: Payer: Self-pay | Admitting: *Deleted

## 2013-02-26 ENCOUNTER — Encounter: Payer: Self-pay | Admitting: *Deleted

## 2013-02-27 ENCOUNTER — Ambulatory Visit (INDEPENDENT_AMBULATORY_CARE_PROVIDER_SITE_OTHER): Payer: Medicare PPO | Admitting: Internal Medicine

## 2013-02-27 ENCOUNTER — Encounter: Payer: Self-pay | Admitting: Internal Medicine

## 2013-02-27 VITALS — BP 124/84 | HR 87 | Temp 97.7°F | Resp 14 | Ht 66.0 in | Wt 220.5 lb

## 2013-02-27 DIAGNOSIS — R251 Tremor, unspecified: Secondary | ICD-10-CM

## 2013-02-27 DIAGNOSIS — G250 Essential tremor: Secondary | ICD-10-CM

## 2013-02-27 DIAGNOSIS — I428 Other cardiomyopathies: Secondary | ICD-10-CM

## 2013-02-27 DIAGNOSIS — R739 Hyperglycemia, unspecified: Secondary | ICD-10-CM

## 2013-02-27 DIAGNOSIS — R7309 Other abnormal glucose: Secondary | ICD-10-CM

## 2013-02-27 DIAGNOSIS — I10 Essential (primary) hypertension: Secondary | ICD-10-CM

## 2013-02-27 DIAGNOSIS — R259 Unspecified abnormal involuntary movements: Secondary | ICD-10-CM

## 2013-02-27 DIAGNOSIS — E669 Obesity, unspecified: Secondary | ICD-10-CM

## 2013-02-27 DIAGNOSIS — I429 Cardiomyopathy, unspecified: Secondary | ICD-10-CM

## 2013-02-27 MED ORDER — TRAMADOL HCL 50 MG PO TABS: 50.0000 mg | ORAL_TABLET | Freq: Every day | ORAL | Status: DC | PRN

## 2013-02-27 MED ORDER — PRIMIDONE 50 MG PO TABS: 50.0000 mg | ORAL_TABLET | Freq: Every day | ORAL | Status: DC

## 2013-02-27 NOTE — Patient Instructions (Signed)
You can take tramadol 50 mg every 6 hours if needed for pain not relieved by tylenol.  It is a strong pain reliever.  The maximum dose is 100 mg every 6 hours   It should not cause constipation, but if it does Take a dulcolax 5 or 10 mg on the nights you use the tramadol   I have sent the 50 mg dose of primidone to [facility].

## 2013-02-27 NOTE — Progress Notes (Signed)
Patient ID: Chris Price, male   DOB: 1940-05-14, 72 y.o.   MRN: [id number]   Patient Active Problem List   Diagnosis Date Noted  . OSA on CPAP 01/03/2013  . Hyperlipidemia LDL goal < 70 03/22/2012  . Bladder outlet obstruction 12/25/2011  . Hyperglycemia 12/23/2011  . Essential tremor 12/22/2011  . Paroxysmal atrial fibrillation 11/07/2011  . Hypertension   . Hearing loss in right ear 09/20/2011  . Elevated PSA, less than 10 ng/ml 09/20/2011  . Automatic implantable cardioverter-defibrillator Medtronic dual-chamber 09/09/2011  . Chronic systolic heart failure 08/25/2011  . IVCD (intraventricular conduction defect)   . Cardiomyopathy   . Coronary artery disease 08/15/2011    Subjective:  CC:   Chief Complaint  Patient presents with  . Follow-up    HPI:   Chris Aquilino O'Brienis a 72 y.o. male who presents  Follow up on chronic conditions including hypertension, CAD, cardiomyopathy and  New onset tremor .  HTN: his lisinopril dose was reduced by Graciela Husbands last month.  BP is better and he has more energy,  Less dizziness  CAD: no chest pain with gold activities.  Weight is stable  Tremor:  Improved with primidone.  No balance issues.    Past Medical History  Diagnosis Date  . Lumbar spinal stenosis   . Degenerative disk disease     lumbar  . Disc herniation   . Hyperlipidemia   . Cardiomyopathy -nonischemic     moderate to severe EF 25-30% March 2013  . Chronic systolic heart failure 08/25/2011  . ICD (implantable cardiac defibrillator) in place   . CHF (congestive heart failure)   . Sleep apnea     uses cpap  . GERD (gastroesophageal reflux disease)   . Neuromuscular disorder     neauropathy   / siatica  . Coronary artery disease 08/15/2011    Moderate nonobstructive CAD with 60% mid LAD, 60% proximal RCA and 30 % stenossis left circumflex.  . Hypertension   . Atrial fibrillation   . IVCD (intraventricular conduction defect)     Nonspecific QRS duration 122  .  Persistent atrial fibrillation 11/07/2011    Past Surgical History  Procedure Laterality Date  . Lumbar laminectomy      L2 through S1 with wide decompression of the thecal sac and nerve roots.  . Card[facility]version  08/2011    [facility]  . Cardiac catheterization  08/15/2011    [facility]   . Hernia repair    . Icd  09/08/2011  . Back surgery    . Cooled thermotherapy  2007    [facility]fe, complicated by incontinence  . Prostate biopsy  2013       The following portions of the patient's history were reviewed and updated as appropriate: Allergies, current medications, and problem list.    Review of Systems:   12 Pt  review of systems was negative except those addressed in the HPI,     History   Social History  . Marital Status: Married    Spouse Name: N/A    Number of Children: N/A  . Years of Education: N/A   Occupational History  . Not on file.   Social History Main Topics  . Smoking status: Never Smoker   . Smokeless tobacco: Never Used  . Alcohol Use: No  . Drug Use: No  . Sexual Activity: Not Currently   Other Topics Concern  . Not on file   Social History Narrative  . No narrative on file  Objective:  Filed Vitals:   02/27/13 0928  BP: 124/84  Pulse: 87  Temp: 97.7 F (36.5 C)  Resp: 14     General appearance: alert, cooperative and appears stated age Ears: normal TM's and external ear canals both ears Throat: lips, mucosa, and tongue normal; teeth and gums normal Neck: no adenopathy, no carotid bruit, supple, symmetrical, trachea midline and thyroid not enlarged, symmetric, no tenderness/mass/nodules Back: symmetric, no curvature. ROM normal. No CVA tenderness. Lungs: clear to auscultation bilaterally Heart: regular rate and rhythm, S1, S2 normal, no murmur, click, rub or gallop Abdomen: soft, non-tender; bowel sounds normal; no masses,  no organomegaly Pulses: 2+ and symmetric Skin: Skin color, texture, turgor normal. No rashes or lesions Lymph nodes:  Cervical, supraclavicular, and axillary nodes normal.  Assessment and Plan:  Cardiomyopathy Well controlled on current regimen. Renal function stable, no changes today.  Essential tremor Improved with low doe of primidone. No signs of Parkinson's Disease.   Hyperglycemia Lab Results  Component Value Date   HGBA1C 5.6 06/27/2012   No signs of diabetes by prior screening .  Low GI diet and regular exercise as tolerated.   Hypertension Well controlled on current regimen. Renal function stable, no changes today.  Lab Results  Component Value Date   CREATININE 0.9 01/02/2013    Obesity, unspecified I have addressed  BMI and recommended wt loss of 10% of body weigh over the next 6 months using a low glycemic index diet and regular exercise a minimum of 5 days per week.     Updated Medication List Outpatient Encounter Prescriptions as of 02/27/2013  Medication Sig Dispense Refill  . atorvastatin (LIPITOR) 80 MG tablet TAKE 1 TABLET EVERY DAY  90 tablet  1  . carvedilol (COREG) 12.5 MG tablet Take 2 tablets (25 mg total) by mouth 2 (two) times daily with a meal.  180 tablet  3  . Coenzyme Q10-Levocarnitine (CO Q-10 PLUS PO) Take 1 tablet by mouth daily.       . dabigatran (PRADAXA) 150 MG CAPS Take 1 capsule (150 mg total) by mouth 2 (two) times daily.  180 capsule  3  . finasteride (PROSCAR) 5 MG tablet Take 5 mg by mouth daily.      . furosemide (LASIX) 20 MG tablet TAKE 1 TABLET EVERY DAY  90 tablet  PRN  . lisinopril (PRINIVIL,ZESTRIL) 20 MG tablet Take 0.5 tablets (10 mg total) by mouth daily.  30 tablet  3  . omeprazole (PRILOSEC) 20 MG capsule Take 1 capsule (20 mg total) by mouth daily as needed. Acid reflux.  90 capsule  3  . primidone (MYSOLINE) 50 MG tablet Take 1 tablet (50 mg total) by mouth at bedtime. And increase weekly by 1/2 tablet  90 tablet  1  . spironolactone (ALDACTONE) 12.5 mg TABS Take 0.5 tablets (12.5 mg total) by mouth daily.  45 tablet  3  . Tamsulosin  HCl (FLOMAX) 0.4 MG CAPS Take 0.4 mg by mouth daily.      . TDaP (BOOSTRIX) 5-2.5-18.5 LF-MCG/0.5 injection Inject 0.5 mLs into the muscle once.  0.5 mL  0  . [DISCONTINUED] primidone (MYSOLINE) 50 MG tablet Take 0.5 tablets (25 mg total) by mouth at bedtime. And increase weekly by 1/2 tablet  60 tablet  0  . traMADol (ULTRAM) 50 MG tablet Take 1 tablet (50 mg total) by mouth daily as needed for pain.  30 tablet  3  . [DISCONTINUED] fish oil-omega-3 fatty acids 1000 MG capsule Take  2 g by mouth 3 (three) times daily. Taking 360 daily       No facility-administered encounter medications on file as of 02/27/2013.

## 2013-02-28 ENCOUNTER — Encounter: Payer: Self-pay | Admitting: Internal Medicine

## 2013-02-28 NOTE — Assessment & Plan Note (Signed)
Improved with low doe of primidone. No signs of Parkinson's Disease.

## 2013-02-28 NOTE — Assessment & Plan Note (Signed)
Well controlled on current regimen. Renal function stable, no changes today.  Lab Results  Component Value Date   CREATININE 0.9 01/02/2013

## 2013-02-28 NOTE — Assessment & Plan Note (Signed)
Lab Results  Component Value Date   HGBA1C 5.6 06/27/2012   No signs of diabetes by prior screening .  Low GI diet and regular exercise as tolerated.

## 2013-02-28 NOTE — Assessment & Plan Note (Signed)
I have addressed  BMI and recommended wt loss of 10% of body weigh over the next 6 months using a low glycemic index diet and regular exercise a minimum of 5 days per week.   

## 2013-02-28 NOTE — Assessment & Plan Note (Signed)
Well controlled on current regimen. Renal function stable, no changes today. 

## 2013-03-22 ENCOUNTER — Telehealth: Payer: Self-pay | Admitting: Internal Medicine

## 2013-03-22 DIAGNOSIS — R251 Tremor, unspecified: Secondary | ICD-10-CM

## 2013-03-22 NOTE — Telephone Encounter (Signed)
Sherry left vm 11/13.  States pt needs primidone 1 daily called to [facility]

## 2013-03-27 NOTE — Telephone Encounter (Signed)
Medication sent on 02/27/13

## 2013-04-26 ENCOUNTER — Other Ambulatory Visit: Payer: Self-pay | Admitting: Internal Medicine

## 2013-04-29 ENCOUNTER — Ambulatory Visit (INDEPENDENT_AMBULATORY_CARE_PROVIDER_SITE_OTHER): Payer: Medicare PPO | Admitting: *Deleted

## 2013-04-29 DIAGNOSIS — I429 Cardiomyopathy, unspecified: Secondary | ICD-10-CM

## 2013-04-29 DIAGNOSIS — I428 Other cardiomyopathies: Secondary | ICD-10-CM

## 2013-04-29 DIAGNOSIS — I5022 Chronic systolic (congestive) heart failure: Secondary | ICD-10-CM

## 2013-04-29 LAB — MDC_IDC_ENUM_SESS_TYPE_REMOTE
Battery Voltage: 3.11 V
Brady Statistic AP VP Percent: 0.35 %
Brady Statistic AP VS Percent: 0.25 %
Brady Statistic AS VP Percent: 19.27 %
Brady Statistic RA Percent Paced: 0.59 %
HIGH POWER IMPEDANCE MEASURED VALUE: 304 Ohm
HighPow Impedance: 399 Ohm
HighPow Impedance: 69 Ohm
Lead Channel Pacing Threshold Amplitude: 0.5 V
Lead Channel Pacing Threshold Amplitude: 1.25 V
Lead Channel Pacing Threshold Pulse Width: 0.4 ms
Lead Channel Sensing Intrinsic Amplitude: 7.375 mV
Lead Channel Sensing Intrinsic Amplitude: 7.375 mV
Lead Channel Setting Pacing Amplitude: 2 V
Lead Channel Setting Pacing Pulse Width: 0.4 ms
Lead Channel Setting Sensing Sensitivity: 0.3 mV
MDC IDC MSMT LEADCHNL RA IMPEDANCE VALUE: 418 Ohm
MDC IDC MSMT LEADCHNL RA SENSING INTR AMPL: 0.625 mV
MDC IDC MSMT LEADCHNL RV IMPEDANCE VALUE: 551 Ohm
MDC IDC MSMT LEADCHNL RV PACING THRESHOLD PULSEWIDTH: 0.4 ms
MDC IDC SESS DTM: 20141222175327
MDC IDC SET LEADCHNL RA PACING AMPLITUDE: 2.75 V
MDC IDC STAT BRADY AS VS PERCENT: 80.14 %
MDC IDC STAT BRADY RV PERCENT PACED: 19.61 %
Zone Setting Detection Interval: 250 ms
Zone Setting Detection Interval: 300 ms
Zone Setting Detection Interval: 350 ms
Zone Setting Detection Interval: 360 ms

## 2013-05-03 ENCOUNTER — Encounter: Payer: Self-pay | Admitting: Cardiovascular Disease

## 2013-05-03 ENCOUNTER — Telehealth: Payer: Self-pay | Admitting: *Deleted

## 2013-05-03 NOTE — Telephone Encounter (Signed)
Patient having oral surgery on 05/15/13 at [facility]. Patient needs to know when to go off Pradaxa. Also has a question about his remote access on his pacer.  Marland Kitchen

## 2013-05-06 NOTE — Telephone Encounter (Signed)
In the absence of prior stroke, stopping pradaxa 024-36 hrs prior to procedure should be sufficient

## 2013-05-06 NOTE — Telephone Encounter (Signed)
See note

## 2013-05-08 NOTE — Telephone Encounter (Signed)
Spoke w/ pt's wife.  She verbalizes understanding and will relay message to pt. Pt to call w/ any questions or concerns.

## 2013-05-23 ENCOUNTER — Encounter: Payer: Self-pay | Admitting: *Deleted

## 2013-05-29 ENCOUNTER — Telehealth: Payer: Self-pay | Admitting: Emergency Medicine

## 2013-05-29 NOTE — Telephone Encounter (Signed)
Referral for [employer] has been sent for [facility]'s office.

## 2013-05-30 ENCOUNTER — Encounter: Payer: Self-pay | Admitting: Internal Medicine

## 2013-06-07 NOTE — Telephone Encounter (Signed)
[employer] approved 4 visits exp 08/27/13. auth # [id number]

## 2013-06-12 ENCOUNTER — Telehealth: Payer: Self-pay | Admitting: Internal Medicine

## 2013-06-12 ENCOUNTER — Ambulatory Visit: Payer: Self-pay | Admitting: Internal Medicine

## 2013-06-12 NOTE — Telephone Encounter (Signed)
Patient Information:  Caller Name: Senon  Phone: [phone]  Patient: Chris Price, Chris Price  Gender: Male  DOB: [birth year]  Age: 73 Years  PCP: Deborra Medina (Adults only)  Office Follow Up:  Does the office need to follow up with this patient?: No  Instructions For The Office: N/A  RN Note:  He is not sure if he has a temperature. He switched from HPO to PPO and cannot be seen in the [facility], where he tried today, 06/12/2013  due to the new insurance requires  a referal. He is really insistent that he gets an appointment today, 06/12/2013. RN/CAN tried looking in EPIC and unable to get appointments in [place] office or [facility] and he is unable to drive to the [place] office. RN/CAN called office due to him unable to be seen at the Urgent Care and advised to be seen in the office today, 06/12/2013 per the triage. Scheduler, "Butch Penny" states she will check with the Triage office nurse. The  office is checking with the [facility] to see if they turn patients away and they are and advised  [facility]. RN/CAN tried calling the [facility] [phone] and they stated no referal necessay. Tim and his wife will go there now.    Symptoms  Reason For Call & Symptoms: Diarrhea X  Reviewed Health History In EMR: Yes  Reviewed Medications In EMR: Yes  Reviewed Allergies In EMR: Yes  Reviewed Surgeries / Procedures: Yes  Date of Onset of Symptoms: 06/09/2013  Treatments Tried: "some pink stuff", something for indigestion, Imodium, gatarade  Treatments Tried Worked: No  Guideline(s) Used:  Diarrhea  Disposition Per Guideline:   Go to Office Now  Reason For Disposition Reached:   Age > 60 years and has had > 6 diarrhea stools in past 24 hours  Advice Given:  Nutrition:  Maintaining some food intake during episodes of diarrhea is important.  Ideal initial foods include boiled starches/cereals (e.g., potatoes, rice, noodles, wheat, oats) with a small amount of salt  to taste.  Other acceptable foods include: bananas, yogurt, crackers, soup.  As your stools return to normal consistency, resume a normal diet.  Diarrhea Medication  - Imodium AD:   Helps reduce diarrhea.  Maximum dosage: 16 mg (8 capsules or 16 teaspoons or 80 ml).  1 capsule = 2 mg; 1 teaspoon (5 ml) = 1 mg.  Call Back If:  Signs of dehydration occur (e.g., no urine for more than 12 hours, very dry mouth, lightheaded, etc.)  Diarrhea lasts over 7 days  You become worse.  Patient Will Follow Care Advice:  YES

## 2013-06-13 ENCOUNTER — Ambulatory Visit (INDEPENDENT_AMBULATORY_CARE_PROVIDER_SITE_OTHER): Payer: Medicare PPO | Admitting: Adult Health

## 2013-06-13 ENCOUNTER — Encounter: Payer: Self-pay | Admitting: Adult Health

## 2013-06-13 VITALS — BP 102/68 | HR 69 | Temp 97.8°F | Resp 16 | Wt 216.5 lb

## 2013-06-13 DIAGNOSIS — R197 Diarrhea, unspecified: Secondary | ICD-10-CM

## 2013-06-13 NOTE — Progress Notes (Signed)
Patient ID: Chris Price, male   DOB: 03/29/1941, 73 y.o.   MRN: [id number]    Subjective:    Patient ID: Chris Price, male    DOB: Oct 13, 1940, 73 y.o.   MRN: [id number]  HPI  A pleasant 73 year old male who presents to clinic with diarrhea since Sunday. He reports that he began taking Imodium yesterday with good results. He has been drinking soup. Also drinking fluids to try to maintain hydration. He had pizza while watching the Super Bowl and shortly after he began to have diarrhea. BMs are more formed today but not completely normal.   Past Medical History  Diagnosis Date  . Lumbar spinal stenosis   . Degenerative disk disease     lumbar  . Disc herniation   . Hyperlipidemia   . Cardiomyopathy -nonischemic     moderate to severe EF 25-30% March 2013  . Chronic systolic heart failure 7/41/2878  . ICD (implantable cardiac defibrillator) in place   . CHF (congestive heart failure)   . Sleep apnea     uses cpap  . GERD (gastroesophageal reflux disease)   . Neuromuscular disorder     neauropathy   / siatica  . Coronary artery disease 08/15/2011    Moderate nonobstructive CAD with 60% mid LAD, 60% proximal RCA and 30 % stenossis left circumflex.  . Hypertension   . Atrial fibrillation   . IVCD (intraventricular conduction defect)     Nonspecific QRS duration 122  . Persistent atrial fibrillation 11/07/2011    Current Outpatient Prescriptions on File Prior to Visit  Medication Sig Dispense Refill  . atorvastatin (LIPITOR) 80 MG tablet TAKE 1 TABLET EVERY DAY  90 tablet  1  . carvedilol (COREG) 12.5 MG tablet Take 2 tablets (25 mg total) by mouth 2 (two) times daily with a meal.  180 tablet  3  . Coenzyme Q10-Levocarnitine (CO Q-10 PLUS PO) Take 1 tablet by mouth daily.       . dabigatran (PRADAXA) 150 MG CAPS Take 1 capsule (150 mg total) by mouth 2 (two) times daily.  180 capsule  3  . finasteride (PROSCAR) 5 MG tablet Take 5 mg by mouth daily.      . furosemide (LASIX)  20 MG tablet TAKE 1 TABLET EVERY DAY  90 tablet  PRN  . lisinopril (PRINIVIL,ZESTRIL) 20 MG tablet Take 0.5 tablets (10 mg total) by mouth daily.  30 tablet  3  . omeprazole (PRILOSEC) 20 MG capsule Take 1 capsule (20 mg total) by mouth daily as needed. Acid reflux.  90 capsule  3  . primidone (MYSOLINE) 50 MG tablet Take 1 tablet (50 mg total) by mouth at bedtime. And increase weekly by 1/2 tablet  90 tablet  1  . spironolactone (ALDACTONE) 12.5 mg TABS Take 0.5 tablets (12.5 mg total) by mouth daily.  45 tablet  3  . Tamsulosin HCl (FLOMAX) 0.4 MG CAPS Take 0.4 mg by mouth daily.      . traMADol (ULTRAM) 50 MG tablet Take 1 tablet (50 mg total) by mouth daily as needed for pain.  30 tablet  3   No current facility-administered medications on file prior to visit.     Review of Systems  Gastrointestinal: Positive for diarrhea. Negative for nausea, vomiting, abdominal pain, blood in stool and abdominal distention.  All other systems reviewed and are negative.       Objective:  BP 102/68  Pulse 69  Temp(Src) 97.8 F (36.6 C) (Oral)  Resp  16  Wt 216 lb 8 oz (98.204 kg)  SpO2 94%   Physical Exam  Constitutional: No distress.  HENT:  Head: Normocephalic and atraumatic.  Cardiovascular: Normal rate and regular rhythm.   Blood pressure low normal. He denies any dizziness.  Pulmonary/Chest: Effort normal. No respiratory distress. He has no wheezes. He has no rales.  Abdominal: Soft.  Hyperactive bowels throughout all quadrants  Musculoskeletal: Normal range of motion.  Neurological: He is alert.  Skin: Skin is warm and dry.  Psychiatric: He has a normal mood and affect. His behavior is normal. Judgment and thought content normal.          Assessment & Plan:    1. Diarrhea Improving. Has taken imodium with good results. Suspect may just be a stomach virus. With blood pressure being low I will check bmet for dehydration. Also check cbc. If watery stool persist I have  instructed him to bing in sample to check for c-diff. Containers provided. RTC if symptoms worsen or fail to improve.

## 2013-06-13 NOTE — Progress Notes (Signed)
Pre visit review using our clinic review tool, if applicable. No additional management support is needed unless otherwise documented below in the visit note. 

## 2013-06-13 NOTE — Telephone Encounter (Signed)
FYI

## 2013-06-14 LAB — CBC WITH DIFFERENTIAL/PLATELET
BASOS PCT: 0.5 % (ref 0.0–3.0)
Basophils Absolute: 0 10*3/uL (ref 0.0–0.1)
Eosinophils Absolute: 0.2 10*3/uL (ref 0.0–0.7)
Eosinophils Relative: 2.6 % (ref 0.0–5.0)
HCT: 48.2 % (ref 39.0–52.0)
Hemoglobin: 15.8 g/dL (ref 13.0–17.0)
LYMPHS PCT: 29.7 % (ref 12.0–46.0)
Lymphs Abs: 1.7 10*3/uL (ref 0.7–4.0)
MCHC: 32.8 g/dL (ref 30.0–36.0)
MCV: 98.9 fl (ref 78.0–100.0)
MONOS PCT: 9.9 % (ref 3.0–12.0)
Monocytes Absolute: 0.6 10*3/uL (ref 0.1–1.0)
Neutro Abs: 3.3 10*3/uL (ref 1.4–7.7)
Neutrophils Relative %: 57.3 % (ref 43.0–77.0)
Platelets: 188 10*3/uL (ref 150.0–400.0)
RBC: 4.87 Mil/uL (ref 4.22–5.81)
RDW: 14.6 % (ref 11.5–14.6)
WBC: 5.8 10*3/uL (ref 4.5–10.5)

## 2013-06-14 LAB — BASIC METABOLIC PANEL
BUN: 13 mg/dL (ref 6–23)
CHLORIDE: 106 meq/L (ref 96–112)
CO2: 26 meq/L (ref 19–32)
CREATININE: 1 mg/dL (ref 0.4–1.5)
Calcium: 8.8 mg/dL (ref 8.4–10.5)
GFR: 80.33 mL/min (ref >60.00)
Glucose, Bld: 68 mg/dL — ABNORMAL LOW (ref 70–99)
POTASSIUM: 3.8 meq/L (ref 3.5–5.1)
Sodium: 139 mEq/L (ref 135–145)

## 2013-06-14 NOTE — Addendum Note (Signed)
Addended by: Karlene Einstein D on: 06/14/2013 02:50 PM   Modules accepted: Orders

## 2013-06-15 ENCOUNTER — Encounter: Payer: Self-pay | Admitting: Adult Health

## 2013-06-15 LAB — CLOSTRIDIUM DIFFICILE EIA: CDIFTX: NEGATIVE

## 2013-07-03 ENCOUNTER — Telehealth: Payer: Self-pay | Admitting: Internal Medicine

## 2013-07-03 ENCOUNTER — Telehealth: Payer: Self-pay

## 2013-07-03 MED ORDER — DABIGATRAN ETEXILATE MESYLATE 150 MG PO CAPS: 150.0000 mg | ORAL_CAPSULE | Freq: Two times a day (BID) | ORAL | Status: DC

## 2013-07-03 NOTE — Telephone Encounter (Signed)
Script faxed.

## 2013-07-03 NOTE — Telephone Encounter (Signed)
Chris Price states [facility] has sent over for refill on Pradaxa.  Pt is out of medication and they [facility] has not received response.  Asking for samples (already in bag up front with help of LaToya).  Asking to please get it called in to [facility].

## 2013-07-03 NOTE — Telephone Encounter (Signed)
Placed samples of Pradaxa 150 mg upfront for pick up.

## 2013-07-03 NOTE — Telephone Encounter (Signed)
Pt would like Pradaxa samples 

## 2013-07-08 ENCOUNTER — Observation Stay: Payer: Self-pay | Admitting: Specialist

## 2013-07-08 DIAGNOSIS — I200 Unstable angina: Secondary | ICD-10-CM

## 2013-07-08 DIAGNOSIS — I10 Essential (primary) hypertension: Secondary | ICD-10-CM

## 2013-07-08 DIAGNOSIS — I4891 Unspecified atrial fibrillation: Secondary | ICD-10-CM

## 2013-07-08 DIAGNOSIS — I428 Other cardiomyopathies: Secondary | ICD-10-CM

## 2013-07-08 LAB — CBC WITH DIFFERENTIAL/PLATELET
BASOS PCT: 0.1 %
Basophil #: 0 10*3/uL (ref 0.0–0.1)
Eosinophil #: 0 10*3/uL (ref 0.0–0.7)
Eosinophil %: 0.5 %
HCT: 43.5 % (ref 40.0–52.0)
HGB: 14.8 g/dL (ref 13.0–18.0)
LYMPHS PCT: 6.1 %
Lymphocyte #: 0.5 10*3/uL — ABNORMAL LOW (ref 1.0–3.6)
MCH: 33.1 pg (ref 26.0–34.0)
MCHC: 34.1 g/dL (ref 32.0–36.0)
MCV: 97 fL (ref 80–100)
MONOS PCT: 6.6 %
Monocyte #: 0.6 x10 3/mm (ref 0.2–1.0)
Neutrophil #: 7.6 10*3/uL — ABNORMAL HIGH (ref 1.4–6.5)
Neutrophil %: 86.7 %
PLATELETS: 162 10*3/uL (ref 150–440)
RBC: 4.49 10*6/uL (ref 4.40–5.90)
RDW: 14.7 % — ABNORMAL HIGH (ref 11.5–14.5)
WBC: 8.8 10*3/uL (ref 3.8–10.6)

## 2013-07-08 LAB — BASIC METABOLIC PANEL
Anion Gap: 5 — ABNORMAL LOW (ref 7–16)
BUN: 25 mg/dL — ABNORMAL HIGH (ref 7–18)
CALCIUM: 9 mg/dL (ref 8.5–10.1)
CHLORIDE: 105 mmol/L (ref 98–107)
CO2: 31 mmol/L (ref 21–32)
Creatinine: 0.96 mg/dL (ref 0.60–1.30)
EGFR (Non-African Amer.): >60
GLUCOSE: 95 mg/dL (ref 65–99)
Osmolality: 285 (ref 275–301)
POTASSIUM: 6.1 mmol/L — AB (ref 3.5–5.1)
Sodium: 141 mmol/L (ref 136–145)

## 2013-07-08 LAB — CBC
HCT: 51.1 % (ref 40.0–52.0)
HGB: 16.3 g/dL (ref 13.0–18.0)
MCH: 31.2 pg (ref 26.0–34.0)
MCHC: 32 g/dL (ref 32.0–36.0)
MCV: 98 fL (ref 80–100)
PLATELETS: 207 10*3/uL (ref 150–440)
RBC: 5.24 10*6/uL (ref 4.40–5.90)
RDW: 14.6 % — AB (ref 11.5–14.5)
WBC: 11.4 10*3/uL — ABNORMAL HIGH (ref 3.8–10.6)

## 2013-07-08 LAB — CK TOTAL AND CKMB (NOT AT ARMC)
CK, TOTAL: 340 U/L — AB
CK-MB: 2.5 ng/mL (ref 0.5–3.6)

## 2013-07-08 LAB — TSH: THYROID STIMULATING HORM: 1.33 u[IU]/mL

## 2013-07-08 LAB — PRO B NATRIURETIC PEPTIDE: B-Type Natriuretic Peptide: 1038 pg/mL — ABNORMAL HIGH (ref 0–125)

## 2013-07-08 LAB — CK-MB
CK-MB: 1.9 ng/mL (ref 0.5–3.6)
CK-MB: 2.1 ng/mL (ref 0.5–3.6)

## 2013-07-08 LAB — PROTIME-INR
INR: 1.2
Prothrombin Time: 14.9 secs — ABNORMAL HIGH (ref 11.5–14.7)

## 2013-07-08 LAB — TROPONIN I
Troponin-I: <0.02 ng/mL
Troponin-I: <0.02 ng/mL

## 2013-07-08 LAB — HEMOGLOBIN A1C: Hemoglobin A1C: 5.9 % (ref 4.2–6.3)

## 2013-07-08 LAB — POTASSIUM: POTASSIUM: 4.2 mmol/L (ref 3.5–5.1)

## 2013-07-08 LAB — APTT: Activated PTT: 36.5 secs — ABNORMAL HIGH (ref 23.6–35.9)

## 2013-07-09 DIAGNOSIS — I2510 Atherosclerotic heart disease of native coronary artery without angina pectoris: Secondary | ICD-10-CM

## 2013-07-09 DIAGNOSIS — E785 Hyperlipidemia, unspecified: Secondary | ICD-10-CM

## 2013-07-09 HISTORY — PX: CARDIAC CATHETERIZATION: SHX172

## 2013-07-09 LAB — CBC WITH DIFFERENTIAL/PLATELET
BASOS ABS: 0 10*3/uL (ref 0.0–0.1)
Basophil %: 0.3 %
Eosinophil #: 0.1 10*3/uL (ref 0.0–0.7)
Eosinophil %: 0.8 %
HCT: 40.2 % (ref 40.0–52.0)
HGB: 13.8 g/dL (ref 13.0–18.0)
Lymphocyte #: 0.7 10*3/uL — ABNORMAL LOW (ref 1.0–3.6)
Lymphocyte %: 8.7 %
MCH: 33.2 pg (ref 26.0–34.0)
MCHC: 34.2 g/dL (ref 32.0–36.0)
MCV: 97 fL (ref 80–100)
Monocyte #: 0.9 x10 3/mm (ref 0.2–1.0)
Monocyte %: 11 %
NEUTROS ABS: 6.4 10*3/uL (ref 1.4–6.5)
Neutrophil %: 79.2 %
Platelet: 150 10*3/uL (ref 150–440)
RBC: 4.15 10*6/uL — ABNORMAL LOW (ref 4.40–5.90)
RDW: 14.1 % (ref 11.5–14.5)
WBC: 8.1 10*3/uL (ref 3.8–10.6)

## 2013-07-09 LAB — LIPID PANEL
Cholesterol: 118 mg/dL (ref 0–200)
HDL Cholesterol: 31 mg/dL — ABNORMAL LOW (ref 40–60)
Ldl Cholesterol, Calc: 76 mg/dL (ref 0–100)
Triglycerides: 54 mg/dL (ref 0–200)
VLDL Cholesterol, Calc: 11 mg/dL (ref 5–40)

## 2013-07-09 LAB — MAGNESIUM: MAGNESIUM: 1.6 mg/dL — AB

## 2013-07-09 LAB — APTT
Activated PTT: 115.8 secs — ABNORMAL HIGH (ref 23.6–35.9)
Activated PTT: 112.9 secs — ABNORMAL HIGH (ref 23.6–35.9)

## 2013-07-09 LAB — BASIC METABOLIC PANEL
Anion Gap: 5 — ABNORMAL LOW (ref 7–16)
BUN: 21 mg/dL — ABNORMAL HIGH (ref 7–18)
CHLORIDE: 109 mmol/L — AB (ref 98–107)
CREATININE: 1.01 mg/dL (ref 0.60–1.30)
Calcium, Total: 7.9 mg/dL — ABNORMAL LOW (ref 8.5–10.1)
Co2: 22 mmol/L (ref 21–32)
EGFR (African American): >60
EGFR (Non-African Amer.): >60
Glucose: 112 mg/dL — ABNORMAL HIGH (ref 65–99)
Osmolality: 276 (ref 275–301)
Potassium: 3.8 mmol/L (ref 3.5–5.1)
SODIUM: 136 mmol/L (ref 136–145)

## 2013-07-10 ENCOUNTER — Encounter: Payer: Self-pay | Admitting: Cardiovascular Disease

## 2013-07-15 ENCOUNTER — Encounter: Payer: Self-pay | Admitting: Cardiovascular Disease

## 2013-07-15 ENCOUNTER — Telehealth: Payer: Self-pay | Admitting: Internal Medicine

## 2013-07-15 ENCOUNTER — Ambulatory Visit (INDEPENDENT_AMBULATORY_CARE_PROVIDER_SITE_OTHER): Payer: Medicare PPO | Admitting: Cardiovascular Disease

## 2013-07-15 VITALS — BP 109/65 | HR 94 | Ht 66.0 in | Wt 219.8 lb

## 2013-07-15 DIAGNOSIS — I482 Chronic atrial fibrillation, unspecified: Secondary | ICD-10-CM

## 2013-07-15 DIAGNOSIS — I2510 Atherosclerotic heart disease of native coronary artery without angina pectoris: Secondary | ICD-10-CM

## 2013-07-15 DIAGNOSIS — R109 Unspecified abdominal pain: Secondary | ICD-10-CM

## 2013-07-15 DIAGNOSIS — I5022 Chronic systolic (congestive) heart failure: Secondary | ICD-10-CM

## 2013-07-15 DIAGNOSIS — I4891 Unspecified atrial fibrillation: Secondary | ICD-10-CM

## 2013-07-15 DIAGNOSIS — R079 Chest pain, unspecified: Secondary | ICD-10-CM

## 2013-07-15 MED ORDER — RIVAROXABAN 20 MG PO TABS: 20.0000 mg | ORAL_TABLET | Freq: Every day | ORAL | Status: DC

## 2013-07-15 NOTE — Telephone Encounter (Signed)
Patient has an appointment with Dr. Sophronia Simas at 11 am  And suggested also from [facility] was patient may need to follow up with GI stated by patient wife . FYI

## 2013-07-15 NOTE — Assessment & Plan Note (Signed)
Current chest pain radiating to the back does not seem to be cardiac and is suggestive of a gastrointestinal etiology. It has been associated with greasy food. Recent cardiac catheterization showed no progression of coronary artery disease. I recommend an abdominal ultrasound to evaluate for gallbladder disease. I'm also referring him to Dr. Allen Norris for EGD and suspected peptic ulcer disease.

## 2013-07-15 NOTE — Telephone Encounter (Signed)
Patient Information:  Caller Name: Chris Price  Phone: [phone]  Patient: Chris, Price  Gender: Male  DOB: February 28, 1941  Age: 73 Years  PCP: Deborra Medina (Adults only)  Office Follow Up:  Does the office need to follow up with this patient?: Yes  Instructions For The Office: No appointments available at [facility].  Caller declined appointments at other locations; states she wants appointment in the afternoon and states plan to follow up with Cardiology office as well.  Caller asks to be called if there is appointment that opens up for this office today.   Symptoms  Reason For Call & Symptoms: Spouse reports he was in the hospital for chest pain, cold sweats and Atrial Fibrillation on 07/07/13.  Heart cath without blockage.  Cardiologist advised to increase medication for reflux to BID.  07/14/13 he had another episode of pain in chest at 7 of 10 for 2-3 hours  with sweating. He took shower, Tylenol PM and antacids, sat in chair x 2 hours.  He has appointment on 07/23/13; wants sooner appointment.  Emergent symptoms ruled out.  Go to ED Now or to Office with PCP Approval due to Intermittent chest pain and pain has increasing in severity or frequency.  Denies chest pain or discomfort in past hour.  Reviewed Health History In EMR: Yes  Reviewed Medications In EMR: Yes  Reviewed Allergies In EMR: Yes  Reviewed Surgeries / Procedures: Yes  Date of Onset of Symptoms: 07/14/2013  Treatments Tried: Shower, Tylenol PM and antacids, sat in chair x 2 hours.  Treatments Tried Worked: Yes  Guideline(s) Used:  Chest Pain  Disposition Per Guideline:   Go to ED Now (or to Office with PCP Approval)  Reason For Disposition Reached:   Intermittent chest pain and pain has been increasing in severity or frequency  Advice Given:  Call Back If:  Severe chest pain  Constant chest pain lasting longer than 5 minutes  Difficulty breathing  Fever  You become worse.  Patient Refused  Recommendation:  Patient Refused Appt, Patient Requests Appt At Later Date  No appointments available at [facility]  Caller declined appointments at other locations; states she wants appointment in the afternoon and states plan to follow up with Cardiology office as well.

## 2013-07-15 NOTE — Telephone Encounter (Signed)
Pt was in hospital last week, dx'd with AFib.  Heavy pain in back, sweating.  Has hospital f/u appt 3/17.  Wife states he had a bout last night with similar symptoms.  They did not go to hospital.  Wanted to see if he could be seen sooner.  Transferred to triage.

## 2013-07-15 NOTE — Assessment & Plan Note (Signed)
He appears to be euvolemic on current medications. He is on optimal medical therapy.

## 2013-07-15 NOTE — Assessment & Plan Note (Signed)
Continue rate control with carvedilol. Pradaxa is known to cause GI symptoms and thus on switching this to Xarelto 20 mg once daily as a trial.

## 2013-07-15 NOTE — Progress Notes (Signed)
Primary care physician: Dr. Derrel Nip  HPI  [name] is a pleasant 73 year old male who is here today for a followup visit after recent hospitalization at [facility] for chest pain. He has no history of chronic systolic heart failure due to nonischemic cardiomyopathy, chronic atrial fibrillation and moderate nonobstructive coronary artery disease.  He is status post dual-chamber ICD placement by Dr. Caryl Comes.  He was hospitalized recently at [facility] after he presented with chest pain radiating to his back associated with diaphoresis and tachycardia. He was in atrial fibrillation with rapid ventricular response. He underwent cardiac catheterization which showed no significant change in coronary artery disease with no evidence of obstructive lesions. The dose of carvedilol was increased in order to control her ventricular rate. Lisinopril was decreased. His symptoms were felt to be GI in nature. He had another episode last night which almost made him go back to the emergency room. He had hamburgers with fries yesterday. Last night he had a severe episode of lower chest pain radiating to his back associated with heartburn and diaphoresis. He became extremely anxious. He has been taking omeprazole which has been helping.   No Known Allergies   Current Outpatient Prescriptions on File Prior to Visit  Medication Sig Dispense Refill  . atorvastatin (LIPITOR) 80 MG tablet TAKE 1 TABLET EVERY DAY  90 tablet  1  . carvedilol (COREG) 12.5 MG tablet Take 2 tablets (25 mg total) by mouth 2 (two) times daily with a meal.  180 tablet  3  . Coenzyme Q10-Levocarnitine (CO Q-10 PLUS PO) Take 1 tablet by mouth daily.       . dabigatran (PRADAXA) 150 MG CAPS capsule Take 1 capsule (150 mg total) by mouth 2 (two) times daily.  180 capsule  3  . finasteride (PROSCAR) 5 MG tablet Take 5 mg by mouth daily.      . furosemide (LASIX) 20 MG tablet TAKE 1 TABLET EVERY DAY  90 tablet  PRN  . lisinopril (PRINIVIL,ZESTRIL) 20 MG tablet  Take 0.5 tablets (10 mg total) by mouth daily.  30 tablet  3  . primidone (MYSOLINE) 50 MG tablet Take 1 tablet (50 mg total) by mouth at bedtime. And increase weekly by 1/2 tablet  90 tablet  1  . spironolactone (ALDACTONE) 12.5 mg TABS Take 0.5 tablets (12.5 mg total) by mouth daily.  45 tablet  3  . Tamsulosin HCl (FLOMAX) 0.4 MG CAPS Take 0.4 mg by mouth daily.      . traMADol (ULTRAM) 50 MG tablet Take 1 tablet (50 mg total) by mouth daily as needed for pain.  30 tablet  3   No current facility-administered medications on file prior to visit.     Past Medical History  Diagnosis Date  . Lumbar spinal stenosis   . Degenerative disk disease     lumbar  . Disc herniation   . Hyperlipidemia   . Cardiomyopathy -nonischemic     moderate to severe EF 25-30% March 2013  . Chronic systolic heart failure 5/73/2202  . ICD (implantable cardiac defibrillator) in place   . CHF (congestive heart failure)   . Sleep apnea     uses cpap  . GERD (gastroesophageal reflux disease)   . Neuromuscular disorder     neauropathy   / siatica  . Coronary artery disease 08/15/2011    Moderate nonobstructive CAD with 60% mid LAD, 60% proximal RCA and 30 % stenossis left circumflex.  . Hypertension   . Atrial fibrillation   .  IVCD (intraventricular conduction defect)     Nonspecific QRS duration 122  . Persistent atrial fibrillation 11/07/2011     Past Surgical History  Procedure Laterality Date  . Lumbar laminectomy      L2 through S1 with wide decompression of the thecal sac and nerve roots.  . Cardioversion  08/2011    [facility]  . Hernia repair    . Icd  09/08/2011  . Back surgery    . Cooled thermotherapy  0865    Wolfe, complicated by incontinence  . Prostate biopsy  2013  . Cardiac catheterization  08/15/2011    [facility]   . Cardiac catheterization  07/09/2013    [facility]     Family History  Problem Relation Age of Onset  . Heart attack Father   . Heart attack Brother      History   Social  History  . Marital Status: Married    Spouse Name: N/A    Number of Children: N/A  . Years of Education: N/A   Occupational History  . Not on file.   Social History Main Topics  . Smoking status: Never Smoker   . Smokeless tobacco: Never Used  . Alcohol Use: No  . Drug Use: No  . Sexual Activity: Not Currently   Other Topics Concern  . Not on file   Social History Narrative  . No narrative on file     PHYSICAL EXAM   BP 109/65  Pulse 94  Ht 5\' 6"  (1.676 m)  Wt 219 lb 12 oz (99.678 kg)  BMI 35.49 kg/m2 Constitutional: He is oriented to person, place, and time. He appears well-developed and well-nourished. No distress.  HENT: No nasal discharge.  Head: Normocephalic and atraumatic.  Eyes: Pupils are equal and round. Right eye exhibits no discharge. Left eye exhibits no discharge.  Neck: Normal range of motion. Neck supple. No JVD present. No thyromegaly present.  Cardiovascular: Normal rate, irregular rhythm, normal heart sounds. Exam reveals no gallop and no friction rub. No murmur heard.  Pulmonary/Chest: Effort normal and breath sounds normal. No stridor. No respiratory distress. He has no wheezes. He has no rales. He exhibits no tenderness.  Abdominal: Soft. Bowel sounds are normal. He exhibits no distension. There is no tenderness. There is no rebound and no guarding.  Musculoskeletal: Normal range of motion. He exhibits no edema and no tenderness.  Neurological: He is alert and oriented to person, place, and time. Coordination normal.  Skin: Skin is warm and dry. No rash noted. He is not diaphoretic. No erythema. No pallor.  Psychiatric: He has a normal mood and affect. His behavior is normal. Judgment and thought content normal.  Right groin is intact with no hematoma or tenderness.     EKG: Atrial fibrillation with possible old anterior infarct.  ASSESSMENT AND PLAN

## 2013-07-15 NOTE — Patient Instructions (Signed)
Your physician recommends that you schedule a follow-up appointment in:  3 months    Your physician has recommended you make the following change in your medication:  Stop Pradaxa  Start Xarelto 20 mg daily   You will have a Abdominal Ultrasound tomorrow at 10 am. Please be there at 09:45 AM. Do not eat or drink anything after midnight the night before.   We are referring you to Dr. Allen Norris for an EGD. If you do not hear from them within 7 business days please call me at [phone].

## 2013-07-15 NOTE — Assessment & Plan Note (Signed)
Recent cardiac catheterization showed no significant progression of coronary artery disease.   

## 2013-07-15 NOTE — Telephone Encounter (Signed)
Please advise 

## 2013-07-16 ENCOUNTER — Telehealth: Payer: Self-pay | Admitting: *Deleted

## 2013-07-16 ENCOUNTER — Other Ambulatory Visit: Payer: Self-pay

## 2013-07-16 ENCOUNTER — Ambulatory Visit: Payer: Self-pay | Admitting: Cardiovascular Disease

## 2013-07-16 DIAGNOSIS — K769 Liver disease, unspecified: Secondary | ICD-10-CM

## 2013-07-16 DIAGNOSIS — R109 Unspecified abdominal pain: Secondary | ICD-10-CM

## 2013-07-16 NOTE — Telephone Encounter (Signed)
Referral sent to Dr. Lynnell Jude office for EGD.

## 2013-07-16 NOTE — Telephone Encounter (Signed)
Scheduled patient for ABD CT.  Reviewed instructions with patient: CT will be at [facility] on [place] on 07/22/13 at 0915 am Must pick up prep kit Friday 3/13  No solids 4 hrs before CT   Patient verbalized understanding

## 2013-07-16 NOTE — Telephone Encounter (Signed)
Message copied by Tracie Harrier on Tue Jul 16, 2013  2:10 PM ------      Message from: Kathlyn Sacramento A      Created: Tue Jul 16, 2013 11:42 AM       He does have gallstones . There is also a growth on the liver. Schedule him for CT abdomin with contrast (liver protocol) to evaluate liver mas noted on ultrasound.       Refer to Dr. Pat Patrick (surgery) for cholecystectomy.        ------

## 2013-07-17 ENCOUNTER — Encounter: Payer: Self-pay | Admitting: Internal Medicine

## 2013-07-22 ENCOUNTER — Encounter: Payer: Self-pay | Admitting: Internal Medicine

## 2013-07-22 ENCOUNTER — Ambulatory Visit: Payer: Self-pay | Admitting: Cardiovascular Disease

## 2013-07-22 ENCOUNTER — Other Ambulatory Visit: Payer: Self-pay

## 2013-07-22 DIAGNOSIS — K769 Liver disease, unspecified: Secondary | ICD-10-CM

## 2013-07-23 ENCOUNTER — Ambulatory Visit: Payer: Medicare PPO | Admitting: Internal Medicine

## 2013-07-23 ENCOUNTER — Ambulatory Visit: Payer: Medicare PPO | Admitting: Cardiovascular Disease

## 2013-07-30 ENCOUNTER — Telehealth: Payer: Self-pay | Admitting: *Deleted

## 2013-07-30 DIAGNOSIS — Z7901 Long term (current) use of anticoagulants: Secondary | ICD-10-CM

## 2013-07-30 DIAGNOSIS — K8020 Calculus of gallbladder without cholecystitis without obstruction: Secondary | ICD-10-CM

## 2013-07-30 DIAGNOSIS — E785 Hyperlipidemia, unspecified: Secondary | ICD-10-CM

## 2013-07-30 NOTE — Telephone Encounter (Signed)
Pt is coming in for labs tomorrow what labs and dX?

## 2013-07-31 ENCOUNTER — Encounter: Payer: Self-pay | Admitting: Internal Medicine

## 2013-07-31 ENCOUNTER — Other Ambulatory Visit (INDEPENDENT_AMBULATORY_CARE_PROVIDER_SITE_OTHER): Payer: Medicare PPO

## 2013-07-31 ENCOUNTER — Ambulatory Visit (INDEPENDENT_AMBULATORY_CARE_PROVIDER_SITE_OTHER): Payer: Medicare PPO | Admitting: *Deleted

## 2013-07-31 DIAGNOSIS — Z7901 Long term (current) use of anticoagulants: Secondary | ICD-10-CM

## 2013-07-31 DIAGNOSIS — E785 Hyperlipidemia, unspecified: Secondary | ICD-10-CM

## 2013-07-31 DIAGNOSIS — I480 Paroxysmal atrial fibrillation: Secondary | ICD-10-CM

## 2013-07-31 DIAGNOSIS — I5022 Chronic systolic (congestive) heart failure: Secondary | ICD-10-CM

## 2013-07-31 DIAGNOSIS — I428 Other cardiomyopathies: Secondary | ICD-10-CM

## 2013-07-31 DIAGNOSIS — I4891 Unspecified atrial fibrillation: Secondary | ICD-10-CM

## 2013-07-31 DIAGNOSIS — I429 Cardiomyopathy, unspecified: Secondary | ICD-10-CM

## 2013-07-31 DIAGNOSIS — K8020 Calculus of gallbladder without cholecystitis without obstruction: Secondary | ICD-10-CM

## 2013-07-31 DIAGNOSIS — I482 Chronic atrial fibrillation, unspecified: Secondary | ICD-10-CM

## 2013-07-31 LAB — CBC WITH DIFFERENTIAL/PLATELET
BASOS ABS: 0 10*3/uL (ref 0.0–0.1)
Basophils Relative: 0.8 % (ref 0.0–3.0)
Eosinophils Absolute: 0.1 10*3/uL (ref 0.0–0.7)
Eosinophils Relative: 2 % (ref 0.0–5.0)
HCT: 42.6 % (ref 39.0–52.0)
Hemoglobin: 14.2 g/dL (ref 13.0–17.0)
LYMPHS PCT: 28.3 % (ref 12.0–46.0)
Lymphs Abs: 1.6 10*3/uL (ref 0.7–4.0)
MCHC: 33.3 g/dL (ref 30.0–36.0)
MCV: 97.7 fl (ref 78.0–100.0)
MONOS PCT: 8.6 % (ref 3.0–12.0)
Monocytes Absolute: 0.5 10*3/uL (ref 0.1–1.0)
Neutro Abs: 3.3 10*3/uL (ref 1.4–7.7)
Neutrophils Relative %: 60.3 % (ref 43.0–77.0)
PLATELETS: 215 10*3/uL (ref 150.0–400.0)
RBC: 4.36 Mil/uL (ref 4.22–5.81)
RDW: 15.1 % — AB (ref 11.5–14.6)
WBC: 5.5 10*3/uL (ref 4.5–10.5)

## 2013-07-31 LAB — LIPID PANEL
CHOLESTEROL: 206 mg/dL — AB (ref 0–200)
HDL: 39.9 mg/dL (ref >39.00)
LDL Cholesterol: 149 mg/dL — ABNORMAL HIGH (ref 0–99)
Total CHOL/HDL Ratio: 5
Triglycerides: 86 mg/dL (ref 0.0–149.0)
VLDL: 17.2 mg/dL (ref 0.0–40.0)

## 2013-07-31 LAB — COMPREHENSIVE METABOLIC PANEL
ALT: 26 U/L (ref 0–53)
AST: 25 U/L (ref 0–37)
Albumin: 3.9 g/dL (ref 3.5–5.2)
Alkaline Phosphatase: 68 U/L (ref 39–117)
BUN: 14 mg/dL (ref 6–23)
CALCIUM: 9.4 mg/dL (ref 8.4–10.5)
CHLORIDE: 106 meq/L (ref 96–112)
CO2: 27 meq/L (ref 19–32)
Creatinine, Ser: 1.1 mg/dL (ref 0.4–1.5)
GFR: 73.28 mL/min (ref >60.00)
Glucose, Bld: 101 mg/dL — ABNORMAL HIGH (ref 70–99)
Potassium: 4.6 mEq/L (ref 3.5–5.1)
SODIUM: 142 meq/L (ref 135–145)
TOTAL PROTEIN: 7 g/dL (ref 6.0–8.3)
Total Bilirubin: 1.1 mg/dL (ref 0.3–1.2)

## 2013-07-31 LAB — MDC_IDC_ENUM_SESS_TYPE_REMOTE
Battery Voltage: 3.1 V
Brady Statistic AP VP Percent: 0.5 %
Brady Statistic AP VS Percent: 0.4 %
Brady Statistic AS VS Percent: 84.7 %
HIGH POWER IMPEDANCE MEASURED VALUE: 65 Ohm
Lead Channel Impedance Value: 456 Ohm
Lead Channel Sensing Intrinsic Amplitude: 0.5 mV
Lead Channel Sensing Intrinsic Amplitude: 6.9 mV
Lead Channel Setting Pacing Amplitude: 2 V
Lead Channel Setting Sensing Sensitivity: 0.3 mV
MDC IDC MSMT LEADCHNL RV IMPEDANCE VALUE: 532 Ohm
MDC IDC MSMT LEADCHNL RV PACING THRESHOLD AMPLITUDE: 0.5 V
MDC IDC MSMT LEADCHNL RV PACING THRESHOLD PULSEWIDTH: 0.4 ms
MDC IDC SET LEADCHNL RA PACING AMPLITUDE: 2.75 V
MDC IDC SET LEADCHNL RV PACING PULSEWIDTH: 0.4 ms
MDC IDC STAT BRADY AS VP PERCENT: 14.4 %
Zone Setting Detection Interval: 250 ms
Zone Setting Detection Interval: 300 ms
Zone Setting Detection Interval: 350 ms
Zone Setting Detection Interval: 360 ms

## 2013-07-31 LAB — PROTIME-INR
INR: 1.6 ratio — AB (ref 0.8–1.0)
Prothrombin Time: 17.1 s — ABNORMAL HIGH (ref 10.2–12.4)

## 2013-08-01 ENCOUNTER — Ambulatory Visit: Payer: Self-pay | Admitting: Surgery

## 2013-08-07 ENCOUNTER — Ambulatory Visit (INDEPENDENT_AMBULATORY_CARE_PROVIDER_SITE_OTHER): Payer: Medicare PPO | Admitting: Internal Medicine

## 2013-08-07 ENCOUNTER — Encounter: Payer: Self-pay | Admitting: Internal Medicine

## 2013-08-07 VITALS — BP 110/72 | HR 77 | Temp 97.8°F | Resp 16 | Wt 220.5 lb

## 2013-08-07 DIAGNOSIS — R259 Unspecified abnormal involuntary movements: Secondary | ICD-10-CM

## 2013-08-07 DIAGNOSIS — K8020 Calculus of gallbladder without cholecystitis without obstruction: Secondary | ICD-10-CM

## 2013-08-07 DIAGNOSIS — R251 Tremor, unspecified: Secondary | ICD-10-CM

## 2013-08-07 DIAGNOSIS — G250 Essential tremor: Secondary | ICD-10-CM

## 2013-08-07 DIAGNOSIS — G252 Other specified forms of tremor: Secondary | ICD-10-CM

## 2013-08-07 DIAGNOSIS — R7309 Other abnormal glucose: Secondary | ICD-10-CM

## 2013-08-07 DIAGNOSIS — R739 Hyperglycemia, unspecified: Secondary | ICD-10-CM

## 2013-08-07 DIAGNOSIS — E669 Obesity, unspecified: Secondary | ICD-10-CM

## 2013-08-07 HISTORY — PX: CHOLECYSTECTOMY: SHX55

## 2013-08-07 MED ORDER — MAGNESIUM OXIDE 400 MG PO CAPS: 1.0000 | ORAL_CAPSULE | Freq: Two times a day (BID) | ORAL | Status: DC

## 2013-08-07 NOTE — Progress Notes (Signed)
Pre-visit discussion using our clinic review tool. No additional management support is needed unless otherwise documented below in the visit note.  

## 2013-08-07 NOTE — Patient Instructions (Addendum)
Please take 2 doses of magnesium oxide today because your Magnesium was low last  Month when you were hsopitalized   Remember that pain medications can make your bowels sleepy so if you are taking Vicodin or Percocet  ,  Start taking dulcolax or Sennokot S at bedtime to prevent constipation. Take it unless you are having loose stools  Or not using pain medicaiton   Avoid greasy food for a few weeks

## 2013-08-07 NOTE — Progress Notes (Signed)
Patient ID: Chris Price, male   DOB: 03-21-1941, 73 y.o.   MRN: [id number]   Patient Active Problem List   Diagnosis Date Noted  . Hypomagnesemia 08/08/2013  . Cholelithiasis 08/08/2013  . Chronic atrial fibrillation 07/15/2013  . Chest pain 07/15/2013  . Obesity, unspecified 02/28/2013  . OSA on CPAP 01/03/2013  . Hyperlipidemia LDL goal < 70 03/22/2012  . Bladder outlet obstruction 12/25/2011  . Hyperglycemia 12/23/2011  . Essential tremor 12/22/2011  . Paroxysmal atrial fibrillation 11/07/2011  . Hypertension   . Hearing loss in right ear 09/20/2011  . Elevated PSA, less than 10 ng/ml 09/20/2011  . Automatic implantable cardioverter-defibrillator [employer]onic dual-chamber 09/09/2011  . Chronic systolic heart failure 29/52/8413  . IVCD (intraventricular conduction defect)   . Cardiomyopathy   . Coronary artery disease 08/15/2011    Subjective:  CC:   Chief Complaint  Patient presents with  . Follow-up    6 monthy labs    HPI:   Chris Price is a 73 y.o. male who presents for 6 month follow up.  Recent admission to [facility] on march 3 for chest pain radiating to back after eating pizza   Had cardiac  cath, EF 35%, noted stenosis of  arteries compared to 2013 cath  Were unchanged.    Had another attack a week later after eating hamburger and fries. , sent for GI evlauation, gallstones found   To have  cholecystectomy tomorrow by Tacey Heap.   Review of hospital records indicated a slightly low Mg of 1.6 during recent admission . not sure if this was addressed   Past Medical History  Diagnosis Date  . Lumbar spinal stenosis   . Degenerative disk disease     lumbar  . Disc herniation   . Hyperlipidemia   . Cardiomyopathy -nonischemic     moderate to severe EF 25-30% March 2013  . Chronic systolic heart failure 2/44/0102  . ICD (implantable cardiac defibrillator) in place   . CHF (congestive heart failure)   . Sleep apnea     uses cpap  . GERD  (gastroesophageal reflux disease)   . Neuromuscular disorder     neauropathy   / siatica  . Hypertension   . Atrial fibrillation   . IVCD (intraventricular conduction defect)     Nonspecific QRS duration 122  . Persistent atrial fibrillation 11/07/2011  . Coronary artery disease 08/15/2011    Moderate nonobstructive CAD with 60% mid LAD, 60% proximal RCA and 30 % stenossis left circumflex. Cardiac catheterization in March of 2015 showed no significant change     Past Surgical History  Procedure Laterality Date  . Lumbar laminectomy      L2 through S1 with wide decompression of the thecal sac and nerve roots.  . Cardioversion  08/2011    [facility]  . Hernia repair    . Icd  09/08/2011  . Back surgery    . Cooled thermotherapy  7253    Wolfe, complicated by incontinence  . Prostate biopsy  2013  . Cardiac catheterization  08/15/2011    [facility]   . Cardiac catheterization  07/09/2013    [facility]       The following portions of the patient's history were reviewed and updated as appropriate: Allergies, current medications, and problem list.    Review of Systems:   Patient denies headache, fevers, malaise, unintentional weight loss, skin rash, eye pain, sinus congestion and sinus pain, sore throat, dysphagia,  hemoptysis , cough, dyspnea, wheezing, chest pain,  palpitations, orthopnea, edema, abdominal pain, nausea, melena, diarrhea, constipation, flank pain, dysuria, hematuria, urinary  Frequency, nocturia, numbness, tingling, seizures,  Focal weakness, Loss of consciousness,  Tremor, insomnia, depression, anxiety, and suicidal ideation.     History   Social History  . Marital Status: Married    Spouse Name: N/A    Number of Children: N/A  . Years of Education: N/A   Occupational History  . Not on file.   Social History Main Topics  . Smoking status: Never Smoker   . Smokeless tobacco: Never Used  . Alcohol Use: No  . Drug Use: No  . Sexual Activity: Not Currently   Other Topics  Concern  . Not on file   Social History Narrative  . No narrative on file    Objective:  Filed Vitals:   08/07/13 0924  BP: 110/72  Pulse: 77  Temp: 97.8 F (36.6 C)  Resp: 16     General appearance: alert, cooperative and appears stated age Ears: normal TM's and external ear canals both ears Throat: lips, mucosa, and tongue normal; teeth and gums normal Neck: no adenopathy, no carotid bruit, supple, symmetrical, trachea midline and thyroid not enlarged, symmetric, no tenderness/mass/nodules Back: symmetric, no curvature. ROM normal. No CVA tenderness. Lungs: clear to auscultation bilaterally Heart: regular rate and rhythm, S1, S2 normal, no murmur, click, rub or gallop Abdomen: soft, non-tender; bowel sounds normal; no masses,  no organomegaly Pulses: 2+ and symmetric Skin: Skin color, texture, turgor normal. No rashes or lesions Lymph nodes: Cervical, supraclavicular, and axillary nodes normal.  Assessment and Plan:  Hypomagnesemia Mild,  1.6 . Noted on hospital discharge labs, with no replacement per patient, may be due to use of diuretic or PPI.  Surgery is tomorrow so there is no time to recheck.  Mg oxide 400 mg bid starting today.   Hyperglycemia Mild with no evidence of DM by repeat fasting glucose or A1c.  Low GI diet recommended.   Obesity, unspecified With abnormal fasting glucose levels not diagnostic of DM .  I have addressed  BMI and recommended a low glycemic index diet utilizing smaller more frequent meals to increase metabolism.  I have also recommended that patient start exercising with a goal of 30 minutes of aerobic exercise a minimum of 5 days per week. Screening for lipid disorders, thyroid and diabetes to be done today.    Cholelithiasis With recurrent episodes of biliary colic and cholecystitis  For elective lap chole April 2   Essential tremor No significant ikmprovement with trial of primidone and there is a potential interaction with Xarelto  so we have discontinued it.    Updated Medication List Outpatient Encounter Prescriptions as of 08/07/2013  Medication Sig  . atorvastatin (LIPITOR) 80 MG tablet TAKE 1 TABLET EVERY DAY  . carvedilol (COREG) 12.5 MG tablet Take 2 tablets (25 mg total) by mouth 2 (two) times daily with a meal.  . Coenzyme Q10-Levocarnitine (CO Q-10 PLUS PO) Take 1 tablet by mouth daily.   . finasteride (PROSCAR) 5 MG tablet Take 5 mg by mouth daily.  . furosemide (LASIX) 20 MG tablet TAKE 1 TABLET EVERY DAY  . lisinopril (PRINIVIL,ZESTRIL) 20 MG tablet Take 0.5 tablets (10 mg total) by mouth daily.  . nitroGLYCERIN (NITROSTAT) 0.4 MG SL tablet Place 0.4 mg under the tongue every 5 (five) minutes as needed for chest pain.  Marland Kitchen omeprazole (PRILOSEC) 20 MG capsule Take 20 mg by mouth 2 (two) times daily before a meal. Acid reflux.  Marland Kitchen  spironolactone (ALDACTONE) 12.5 mg TABS Take 0.5 tablets (12.5 mg total) by mouth daily.  . Tamsulosin HCl (FLOMAX) 0.4 MG CAPS Take 0.4 mg by mouth daily.  . traMADol (ULTRAM) 50 MG tablet Take 1 tablet (50 mg total) by mouth daily as needed for pain.  . [DISCONTINUED] primidone (MYSOLINE) 50 MG tablet Take 1 tablet (50 mg total) by mouth at bedtime. And increase weekly by 1/2 tablet  . Magnesium Oxide 400 MG CAPS Take 1 capsule (400 mg total) by mouth 2 (two) times daily after a meal.  . Rivaroxaban (XARELTO) 20 MG TABS tablet Take 1 tablet (20 mg total) by mouth daily with supper.     No orders of the defined types were placed in this encounter.    No Follow-up on file.

## 2013-08-08 ENCOUNTER — Ambulatory Visit: Payer: Self-pay | Admitting: Surgery

## 2013-08-08 DIAGNOSIS — I472 Ventricular tachycardia: Secondary | ICD-10-CM

## 2013-08-08 DIAGNOSIS — I4729 Other ventricular tachycardia: Secondary | ICD-10-CM

## 2013-08-08 DIAGNOSIS — I10 Essential (primary) hypertension: Secondary | ICD-10-CM

## 2013-08-08 DIAGNOSIS — K8020 Calculus of gallbladder without cholecystitis without obstruction: Secondary | ICD-10-CM | POA: Insufficient documentation

## 2013-08-08 DIAGNOSIS — E785 Hyperlipidemia, unspecified: Secondary | ICD-10-CM

## 2013-08-08 DIAGNOSIS — I4891 Unspecified atrial fibrillation: Secondary | ICD-10-CM

## 2013-08-08 DIAGNOSIS — I428 Other cardiomyopathies: Secondary | ICD-10-CM

## 2013-08-08 LAB — CBC WITH DIFFERENTIAL/PLATELET
Basophil #: 0 10*3/uL (ref 0.0–0.1)
Basophil %: 0.4 %
EOS PCT: 0.1 %
Eosinophil #: 0 10*3/uL (ref 0.0–0.7)
HCT: 42.3 % (ref 40.0–52.0)
HGB: 13.8 g/dL (ref 13.0–18.0)
Lymphocyte #: 1.2 10*3/uL (ref 1.0–3.6)
Lymphocyte %: 9.2 %
MCH: 31.9 pg (ref 26.0–34.0)
MCHC: 32.6 g/dL (ref 32.0–36.0)
MCV: 98 fL (ref 80–100)
MONO ABS: 0.7 x10 3/mm (ref 0.2–1.0)
MONOS PCT: 5.4 %
NEUTROS PCT: 84.9 %
Neutrophil #: 11.3 10*3/uL — ABNORMAL HIGH (ref 1.4–6.5)
Platelet: 135 10*3/uL — ABNORMAL LOW (ref 150–440)
RBC: 4.31 10*6/uL — ABNORMAL LOW (ref 4.40–5.90)
RDW: 14.5 % (ref 11.5–14.5)
WBC: 13.3 10*3/uL — AB (ref 3.8–10.6)

## 2013-08-08 NOTE — Assessment & Plan Note (Signed)
Mild with no evidence of DM by repeat fasting glucose or A1c.  Low GI diet recommended.

## 2013-08-08 NOTE — Assessment & Plan Note (Signed)
Mild,  1.6 . Noted on hospital discharge labs, with no replacement per patient, may be due to use of diuretic or PPI.  Surgery is tomorrow so there is no time to recheck.  Mg oxide 400 mg bid starting today.

## 2013-08-08 NOTE — Assessment & Plan Note (Addendum)
With recurrent episodes of biliary colic and cholecystitis  For elective lap chole April 2

## 2013-08-08 NOTE — Assessment & Plan Note (Signed)
With abnormal fasting glucose levels not diagnostic of DM .  I have addressed  BMI and recommended a low glycemic index diet utilizing smaller more frequent meals to increase metabolism.  I have also recommended that patient start exercising with a goal of 30 minutes of aerobic exercise a minimum of 5 days per week. Screening for lipid disorders, thyroid and diabetes to be done today.

## 2013-08-08 NOTE — Assessment & Plan Note (Signed)
No significant ikmprovement with trial of primidone and there is a potential interaction with Xarelto so we have discontinued it.

## 2013-08-09 LAB — CBC WITH DIFFERENTIAL/PLATELET
Basophil #: 0 10*3/uL (ref 0.0–0.1)
Basophil %: 0.4 %
EOS PCT: 0.5 %
Eosinophil #: 0 10*3/uL (ref 0.0–0.7)
HCT: 40.6 % (ref 40.0–52.0)
HGB: 13.4 g/dL (ref 13.0–18.0)
Lymphocyte #: 1.2 10*3/uL (ref 1.0–3.6)
Lymphocyte %: 12.4 %
MCH: 32.5 pg (ref 26.0–34.0)
MCHC: 33 g/dL (ref 32.0–36.0)
MCV: 99 fL (ref 80–100)
Monocyte #: 0.9 x10 3/mm (ref 0.2–1.0)
Monocyte %: 9.3 %
Neutrophil #: 7.8 10*3/uL — ABNORMAL HIGH (ref 1.4–6.5)
Neutrophil %: 77.4 %
PLATELETS: 129 10*3/uL — AB (ref 150–440)
RBC: 4.13 10*6/uL — ABNORMAL LOW (ref 4.40–5.90)
RDW: 14.4 % (ref 11.5–14.5)
WBC: 10 10*3/uL (ref 3.8–10.6)

## 2013-08-09 LAB — PATHOLOGY REPORT

## 2013-08-14 ENCOUNTER — Telehealth: Payer: Self-pay | Admitting: *Deleted

## 2013-08-14 NOTE — Telephone Encounter (Signed)
Patient has been to his referral appt with Dr. Allen Norris and had his procedure.

## 2013-08-16 ENCOUNTER — Telehealth: Payer: Self-pay | Admitting: *Deleted

## 2013-08-16 MED ORDER — CULTURELLE DIGESTIVE HEALTH PO CAPS: 1.0000 | ORAL_CAPSULE | Freq: Every day | ORAL | Status: DC

## 2013-08-16 MED ORDER — BENZONATATE 200 MG PO CAPS: 200.0000 mg | ORAL_CAPSULE | Freq: Three times a day (TID) | ORAL | Status: DC | PRN

## 2013-08-16 MED ORDER — AMOXICILLIN-POT CLAVULANATE 875-125 MG PO TABS: 1.0000 | ORAL_TABLET | Freq: Two times a day (BID) | ORAL | Status: DC

## 2013-08-16 NOTE — Telephone Encounter (Signed)
Patient coughing up white to yellow mucus X 2 days,  Gall bladder surgery on 08/08/13 patient c/o pain from coughing no appointments   here and nothing at [facility]. Please advise.

## 2013-08-16 NOTE — Telephone Encounter (Signed)
Tessalon perles 200 mg tid for cough augmeint twice daily x 7 days Culturelle, priobiotic take daily  to prevent diarrhea  i will send these to his pharmacy   Give follow up appt next wek to ensure resolution

## 2013-08-16 NOTE — Addendum Note (Signed)
Addended by: Nanci Pina on: 08/16/2013 04:35 PM   Modules accepted: Orders

## 2013-08-16 NOTE — Telephone Encounter (Signed)
Patient notified and follow up made.

## 2013-08-19 ENCOUNTER — Encounter: Payer: Self-pay | Admitting: Internal Medicine

## 2013-08-19 ENCOUNTER — Ambulatory Visit (INDEPENDENT_AMBULATORY_CARE_PROVIDER_SITE_OTHER): Payer: Medicare PPO | Admitting: Internal Medicine

## 2013-08-19 VITALS — BP 108/64 | HR 86 | Temp 98.3°F | Resp 16 | Wt 212.5 lb

## 2013-08-19 DIAGNOSIS — J069 Acute upper respiratory infection, unspecified: Secondary | ICD-10-CM

## 2013-08-19 DIAGNOSIS — Z9889 Other specified postprocedural states: Secondary | ICD-10-CM

## 2013-08-19 DIAGNOSIS — Z9049 Acquired absence of other specified parts of digestive tract: Secondary | ICD-10-CM

## 2013-08-19 MED ORDER — OXYCODONE-ACETAMINOPHEN 5-325 MG PO TABS: 1.0000 | ORAL_TABLET | Freq: Three times a day (TID) | ORAL | Status: DC | PRN

## 2013-08-19 NOTE — Progress Notes (Signed)
Patient ID: Chris Price, male   DOB: April 27, 1941, 73 y.o.   MRN: [id number]  Patient Active Problem List   Diagnosis Date Noted  . Acute upper respiratory infections of unspecified site 08/20/2013  . S/P laparoscopic cholecystectomy 08/20/2013  . Hypomagnesemia 08/08/2013  . Cholelithiasis 08/08/2013  . Chronic atrial fibrillation 07/15/2013  . Chest pain 07/15/2013  . Obesity, unspecified 02/28/2013  . OSA on CPAP 01/03/2013  . Hyperlipidemia LDL goal < 70 03/22/2012  . Bladder outlet obstruction 12/25/2011  . Hyperglycemia 12/23/2011  . Essential tremor 12/22/2011  . Paroxysmal atrial fibrillation 11/07/2011  . Hypertension   . Hearing loss in right ear 09/20/2011  . Elevated PSA, less than 10 ng/ml 09/20/2011  . Automatic implantable cardioverter-defibrillator Medtronic dual-chamber 09/09/2011  . Chronic systolic heart failure 41/66/0630  . IVCD (intraventricular conduction defect)   . Cardiomyopathy   . Coronary artery disease 08/15/2011    Subjective:  CC:   Chief Complaint  Patient presents with  . Follow-up  . Cough    HPI:   Chris Price is a 73 y.o. male who presents for  Follow up on bronchitis which startedone week after hospitalization for elective lap chole.  Symptoms began after Hospital follow up with Dr. Felton Clinton,  I started him on augmentin last Friday. Has had  Some night sweats..  No loose stools ,  Taking a priobiotoic with the augmentin  Post op lap chole.  Remote inguinal hernia repair acting up due to coughing spells.  ,  Occurred and was surgically repaired at age 15   Past Medical History  Diagnosis Date  . Lumbar spinal stenosis   . Degenerative disk disease     lumbar  . Disc herniation   . Hyperlipidemia   . Cardiomyopathy -nonischemic     moderate to severe EF 25-30% March 2013  . Chronic systolic heart failure 1/60/1093  . ICD (implantable cardiac defibrillator) in place   . CHF (congestive heart failure)   . Sleep apnea      uses cpap  . GERD (gastroesophageal reflux disease)   . Neuromuscular disorder     neauropathy   / siatica  . Hypertension   . Atrial fibrillation   . IVCD (intraventricular conduction defect)     Nonspecific QRS duration 122  . Persistent atrial fibrillation 11/07/2011  . Coronary artery disease 08/15/2011    Moderate nonobstructive CAD with 60% mid LAD, 60% proximal RCA and 30 % stenossis left circumflex. Cardiac catheterization in March of 2015 showed no significant change     Past Surgical History  Procedure Laterality Date  . Lumbar laminectomy      L2 through S1 with wide decompression of the thecal sac and nerve roots.  . Cardioversion  08/2011    [facility]  . Hernia repair    . Icd  09/08/2011  . Back surgery    . Cooled thermotherapy  2355    [facility], complicated by incontinence  . Prostate biopsy  2013  . Cardiac catheterization  08/15/2011    [facility]   . Cardiac catheterization  07/09/2013    [facility]       The following portions of the patient's history were reviewed and updated as appropriate: Allergies, current medications, and problem list.    Review of Systems:   Patient denies headache, fevers, malaise, unintentional weight loss, skin rash, eye pain, sinus congestion and sinus pain, sore throat, dysphagia,  hemoptysis , cough, dyspnea, wheezing, chest pain, palpitations, orthopnea, edema, abdominal pain, nausea,  melena, diarrhea, constipation, flank pain, dysuria, hematuria, urinary  Frequency, nocturia, numbness, tingling, seizures,  Focal weakness, Loss of consciousness,  Tremor, insomnia, depression, anxiety, and suicidal ideation.     History   Social History  . Marital Status: Married    Spouse Name: N/A    Number of Children: N/A  . Years of Education: N/A   Occupational History  . Not on file.   Social History Main Topics  . Smoking status: Never Smoker   . Smokeless tobacco: Never Used  . Alcohol Use: No  . Drug Use: No  . Sexual Activity: Not Currently    Other Topics Concern  . Not on file   Social History Narrative  . No narrative on file    Objective:  Filed Vitals:   08/19/13 1712  BP: 108/64  Pulse: 86  Temp: 98.3 F (36.8 C)  Resp: 16     General appearance: alert, cooperative and appears stated age Ears: normal TM's and external ear canals both ears Throat: lips, mucosa, and tongue normal; teeth and gums normal Neck: no adenopathy, no carotid bruit, supple, symmetrical, trachea midline and thyroid not enlarged, symmetric, no tenderness/mass/nodules Back: symmetric, no curvature. ROM normal. No CVA tenderness. Lungs: clear to auscultation bilaterally Heart: regular rate and rhythm, S1, S2 normal, no murmur, click, rub or gallop Abdomen:  Lap chole incisional pts covered in steri strips,  No sings if infection , bowel sounds normal, no masses,  no organomegaly Pulses: 2+ and symmetric Skin: Skin color, texture, turgor normal. No rashes or lesions Lymph nodes: Cervical, supraclavicular, and axillary nodes normal.  Assessment and Plan:  Acute upper respiratory infections of unspecified site Improving,  Continue current therapy.  No indications for chest x ray.   S/P laparoscopic cholecystectomy Symptoms resolve,d incisions well healing.,  Steri strips still on,  Advised to leave them alone.    Updated Medication List Outpatient Encounter Prescriptions as of 08/19/2013  Medication Sig  . amoxicillin-clavulanate (AUGMENTIN) 875-125 MG per tablet Take 1 tablet by mouth 2 (two) times daily.  Marland Kitchen atorvastatin (LIPITOR) 80 MG tablet TAKE 1 TABLET EVERY DAY  . benzonatate (TESSALON) 200 MG capsule Take 1 capsule (200 mg total) by mouth 3 (three) times daily as needed for cough.  . carvedilol (COREG) 12.5 MG tablet Take 2 tablets (25 mg total) by mouth 2 (two) times daily with a meal.  . Coenzyme Q10-Levocarnitine (CO Q-10 PLUS PO) Take 1 tablet by mouth daily.   . finasteride (PROSCAR) 5 MG tablet Take 5 mg by mouth daily.   . furosemide (LASIX) 20 MG tablet TAKE 1 TABLET EVERY DAY  . Lactobacillus-Inulin ([place]) CAPS Take 1 capsule by mouth daily.  Marland Kitchen lisinopril (PRINIVIL,ZESTRIL) 20 MG tablet Take 0.5 tablets (10 mg total) by mouth daily.  . Magnesium Oxide 400 MG CAPS Take 1 capsule (400 mg total) by mouth 2 (two) times daily after a meal.  . nitroGLYCERIN (NITROSTAT) 0.4 MG SL tablet Place 0.4 mg under the tongue every 5 (five) minutes as needed for chest pain.  Marland Kitchen omeprazole (PRILOSEC) 20 MG capsule Take 20 mg by mouth 2 (two) times daily before a meal. Acid reflux.  Marland Kitchen oxyCODONE-acetaminophen (PERCOCET/ROXICET) 5-325 MG per tablet Take 1 tablet by mouth every 8 (eight) hours as needed.  . Rivaroxaban (XARELTO) 20 MG TABS tablet Take 1 tablet (20 mg total) by mouth daily with supper.  Marland Kitchen spironolactone (ALDACTONE) 12.5 mg TABS Take 0.5 tablets (12.5 mg total) by mouth daily.  Marland Kitchen  Tamsulosin HCl (FLOMAX) 0.4 MG CAPS Take 0.4 mg by mouth daily.  . traMADol (ULTRAM) 50 MG tablet Take 1 tablet (50 mg total) by mouth daily as needed for pain.  . [DISCONTINUED] oxyCODONE-acetaminophen (PERCOCET/ROXICET) 5-325 MG per tablet Take 1 tablet by mouth 4 (four) times daily as needed.     No orders of the defined types were placed in this encounter.    No Follow-up on file.

## 2013-08-19 NOTE — Patient Instructions (Addendum)
You can continue the tylenol PM because it has dipenhydramine  Will  Help the post nasal dip  Continue the tessalon perles for cough and finish the antibiotic   Wear mask when you go outside  Flush sinuses twice daily with salt water

## 2013-08-20 DIAGNOSIS — Z9049 Acquired absence of other specified parts of digestive tract: Secondary | ICD-10-CM | POA: Insufficient documentation

## 2013-08-20 DIAGNOSIS — J069 Acute upper respiratory infection, unspecified: Secondary | ICD-10-CM | POA: Insufficient documentation

## 2013-08-20 NOTE — Assessment & Plan Note (Signed)
Symptoms resolve,d incisions well healing.,  Steri strips still on,  Advised to leave them alone.

## 2013-08-20 NOTE — Assessment & Plan Note (Signed)
Improving,  Continue current therapy.  No indications for chest x ray.

## 2013-09-02 ENCOUNTER — Telehealth: Payer: Self-pay | Admitting: Internal Medicine

## 2013-09-02 NOTE — Telephone Encounter (Signed)
Patient scheduled for 09/03/13

## 2013-09-02 NOTE — Telephone Encounter (Signed)
States he has been in for cough/congestion.  States he is still hacking very bad.  Still has a few cough pills.  Asking if he needs another round of antibiotics.  Kristopher Oppenheim.  Please advise.  States if you cannot contact him on cell to call her.

## 2013-09-03 ENCOUNTER — Encounter: Payer: Self-pay | Admitting: *Deleted

## 2013-09-03 ENCOUNTER — Encounter: Payer: Self-pay | Admitting: Internal Medicine

## 2013-09-03 ENCOUNTER — Ambulatory Visit (INDEPENDENT_AMBULATORY_CARE_PROVIDER_SITE_OTHER): Payer: Medicare PPO | Admitting: Internal Medicine

## 2013-09-03 VITALS — BP 118/70 | HR 93 | Temp 98.2°F | Resp 16 | Wt 213.5 lb

## 2013-09-03 DIAGNOSIS — R197 Diarrhea, unspecified: Secondary | ICD-10-CM

## 2013-09-03 DIAGNOSIS — T8189XA Other complications of procedures, not elsewhere classified, initial encounter: Secondary | ICD-10-CM

## 2013-09-03 DIAGNOSIS — Z9889 Other specified postprocedural states: Secondary | ICD-10-CM

## 2013-09-03 DIAGNOSIS — J40 Bronchitis, not specified as acute or chronic: Secondary | ICD-10-CM

## 2013-09-03 MED ORDER — BENZONATATE 100 MG PO CAPS: 100.0000 mg | ORAL_CAPSULE | Freq: Three times a day (TID) | ORAL | Status: DC | PRN

## 2013-09-03 MED ORDER — FLUNISOLIDE HFA 80 MCG/ACT IN AERS: 2.0000 | INHALATION_SPRAY | Freq: Two times a day (BID) | RESPIRATORY_TRACT | Status: DC

## 2013-09-03 MED ORDER — GUAIFENESIN-CODEINE 100-10 MG/5ML PO SYRP: 10.0000 mL | ORAL_SOLUTION | Freq: Every evening | ORAL | Status: DC | PRN

## 2013-09-03 NOTE — Patient Instructions (Addendum)
Your recent viral infection has caused your airways to be irritable  You can use the cough capsules during the day but reduce the dose to 1 capsule every 6 hours to reduce the drowsiness  You can use the Cheratussin cough syrup for nighttime cough.,  It is mildly sedating  Please use the inhaler :   2 puffs twice daily for the next two weeks  Please take a probiotic ( Align, [place] or Culturelle)  For two weeks,    You can use immodium sparingly to prevent diarrhea  i may be able to prescribe something foe the diarrhea that the surgeon should have offered you last week, but I will have to do some research

## 2013-09-03 NOTE — Progress Notes (Signed)
Patient ID: Chris Price, male   DOB: [birth year], 73 y.o.   MRN: [id number]  Patient Active Problem List   Diagnosis Date Noted  . Bronchitis 09/04/2013  . Diarrhea following gastrointestinal surgery 09/04/2013  . Acute upper respiratory infections of unspecified site 08/20/2013  . S/P laparoscopic cholecystectomy 08/20/2013  . Hypomagnesemia 08/08/2013  . Cholelithiasis 08/08/2013  . Chronic atrial fibrillation 07/15/2013  . Chest pain 07/15/2013  . Obesity, unspecified 02/28/2013  . OSA on CPAP 01/03/2013  . Hyperlipidemia LDL goal < 70 03/22/2012  . Bladder outlet obstruction 12/25/2011  . Hyperglycemia 12/23/2011  . Essential tremor 12/22/2011  . Paroxysmal atrial fibrillation 11/07/2011  . Hypertension   . Hearing loss in right ear 09/20/2011  . Elevated PSA, less than 10 ng/ml 09/20/2011  . Automatic implantable cardioverter-defibrillator Medtronic dual-chamber 09/09/2011  . Chronic systolic heart failure 85/46/2703  . IVCD (intraventricular conduction defect)   . Cardiomyopathy   . Coronary artery disease 08/15/2011    Subjective:  CC:   Chief Complaint  Patient presents with  . Follow-up  . Cough    congestion , productive cough white mucus    HPI:   Chris Price is a 73 y.o. male who presents for Cough since 4/7  Finished augmenitn ,  Had 3 sleepless nights due to cough, but the last two nights have been better.  No fevers,  Sputum is clear and scant.  Has been  having recurrent diarrhea since his elective chole 3 weeks ago,  Some accidents   Some urgency .  No abdominal pain or fevers.    Past Medical History  Diagnosis Date  . Lumbar spinal stenosis   . Degenerative disk disease     lumbar  . Disc herniation   . Hyperlipidemia   . Cardiomyopathy -nonischemic     moderate to severe EF 25-30% March 2013  . Chronic systolic heart failure 5/00/9381  . ICD (implantable cardiac defibrillator) in place   . CHF (congestive heart failure)   . Sleep  apnea     uses cpap  . GERD (gastroesophageal reflux disease)   . Neuromuscular disorder     neauropathy   / siatica  . Hypertension   . Atrial fibrillation   . IVCD (intraventricular conduction defect)     Nonspecific QRS duration 122  . Persistent atrial fibrillation 11/07/2011  . Coronary artery disease 08/15/2011    Moderate nonobstructive CAD with 60% mid LAD, 60% proximal RCA and 30 % stenossis left circumflex. Cardiac catheterization in March of 2015 showed no significant change     Past Surgical History  Procedure Laterality Date  . Lumbar laminectomy      L2 through S1 with wide decompression of the thecal sac and nerve roots.  . Cardioversion  08/2011    [facility]  . Hernia repair    . Icd  09/08/2011  . Back surgery    . Cooled thermotherapy  8299    [facility], complicated by incontinence  . Prostate biopsy  2013  . Cardiac catheterization  08/15/2011    [facility]   . Cardiac catheterization  07/09/2013    [facility]  . Cholecystectomy  April 2015       The following portions of the patient's history were reviewed and updated as appropriate: Allergies, current medications, and problem list.    Review of Systems:   Patient denies headache, fevers, malaise, unintentional weight loss, skin rash, eye pain, sinus congestion and sinus pain, sore throat, dysphagia,  hemoptysis , cough,  dyspnea, wheezing, chest pain, palpitations, orthopnea, edema, abdominal pain, nausea, melena, diarrhea, constipation, flank pain, dysuria, hematuria, urinary  Frequency, nocturia, numbness, tingling, seizures,  Focal weakness, Loss of consciousness,  Tremor, insomnia, depression, anxiety, and suicidal ideation.     History   Social History  . Marital Status: Married    Spouse Name: N/A    Number of Children: N/A  . Years of Education: N/A   Occupational History  . Not on file.   Social History Main Topics  . Smoking status: Never Smoker   . Smokeless tobacco: Never Used  . Alcohol Use: No  . Drug  Use: No  . Sexual Activity: Not Currently   Other Topics Concern  . Not on file   Social History Narrative  . No narrative on file    Objective:  Filed Vitals:   09/03/13 1439  BP: 118/70  Pulse: 93  Temp: 98.2 F (36.8 C)  Resp: 16     General appearance: alert, cooperative and appears stated age Ears: normal TM's and external ear canals both ears Throat: lips, mucosa, and tongue normal; teeth and gums normal Neck: no adenopathy, no carotid bruit, supple, symmetrical, trachea midline and thyroid not enlarged, symmetric, no tenderness/mass/nodules Back: symmetric, no curvature. ROM normal. No CVA tenderness. Lungs: clear to auscultation bilaterally Heart: regular rate and rhythm, S1, S2 normal, no murmur, click, rub or gallop Abdomen: soft, non-tender; bowel sounds normal; no masses,  no organomegaly Pulses: 2+ and symmetric Skin: Skin color, texture, turgor normal. No rashes or lesions Lymph nodes: Cervical, supraclavicular, and axillary nodes normal.  Assessment and Plan:  Bronchitis addigf flovent and cough suppressant for nornal exam   Diarrhea following gastrointestinal surgery Trial of cholestyramine 2 gm daily    Updated Medication List Outpatient Encounter Prescriptions as of 09/03/2013  Medication Sig  . atorvastatin (LIPITOR) 80 MG tablet TAKE 1 TABLET EVERY DAY  . benzonatate (TESSALON) 100 MG capsule Take 1 capsule (100 mg total) by mouth 3 (three) times daily as needed for cough.  . carvedilol (COREG) 12.5 MG tablet Take 2 tablets (25 mg total) by mouth 2 (two) times daily with a meal.  . Coenzyme Q10-Levocarnitine (CO Q-10 PLUS PO) Take 1 tablet by mouth daily.   . finasteride (PROSCAR) 5 MG tablet Take 5 mg by mouth daily.  . furosemide (LASIX) 20 MG tablet TAKE 1 TABLET EVERY DAY  . Lactobacillus-Inulin ([place]) CAPS Take 1 capsule by mouth daily.  Marland Kitchen lisinopril (PRINIVIL,ZESTRIL) 20 MG tablet Take 0.5 tablets (10 mg total) by  mouth daily.  . Magnesium Oxide 400 MG CAPS Take 1 capsule (400 mg total) by mouth 2 (two) times daily after a meal.  . nitroGLYCERIN (NITROSTAT) 0.4 MG SL tablet Place 0.4 mg under the tongue every 5 (five) minutes as needed for chest pain.  Marland Kitchen omeprazole (PRILOSEC) 20 MG capsule Take 20 mg by mouth 2 (two) times daily before a meal. Acid reflux.  . Rivaroxaban (XARELTO) 20 MG TABS tablet Take 1 tablet (20 mg total) by mouth daily with supper.  Marland Kitchen spironolactone (ALDACTONE) 12.5 mg TABS Take 0.5 tablets (12.5 mg total) by mouth daily.  . Tamsulosin HCl (FLOMAX) 0.4 MG CAPS Take 0.4 mg by mouth daily.  . [DISCONTINUED] benzonatate (TESSALON) 200 MG capsule Take 1 capsule (200 mg total) by mouth 3 (three) times daily as needed for cough.  Marland Kitchen amoxicillin-clavulanate (AUGMENTIN) 875-125 MG per tablet Take 1 tablet by mouth 2 (two) times daily.  . cholestyramine light (  PREVALITE) 4 G packet Take 0.5 packets (2 g total) by mouth 2 (two) times daily.  . Flunisolide HFA 80 MCG/ACT AERS Inhale 2 puffs into the lungs 2 (two) times daily.  Marland Kitchen guaiFENesin-codeine (CHERATUSSIN AC) 100-10 MG/5ML syrup Take 10 mLs by mouth at bedtime as needed for cough.  Marland Kitchen oxyCODONE-acetaminophen (PERCOCET/ROXICET) 5-325 MG per tablet Take 1 tablet by mouth every 8 (eight) hours as needed.  . traMADol (ULTRAM) 50 MG tablet Take 1 tablet (50 mg total) by mouth daily as needed for pain.     No orders of the defined types were placed in this encounter.    No Follow-up on file.

## 2013-09-03 NOTE — Progress Notes (Signed)
Pre-visit discussion using our clinic review tool. No additional management support is needed unless otherwise documented below in the visit note.  

## 2013-09-04 ENCOUNTER — Encounter: Payer: Self-pay | Admitting: Internal Medicine

## 2013-09-04 DIAGNOSIS — Z9889 Other specified postprocedural states: Secondary | ICD-10-CM

## 2013-09-04 DIAGNOSIS — R197 Diarrhea, unspecified: Secondary | ICD-10-CM | POA: Insufficient documentation

## 2013-09-04 DIAGNOSIS — J40 Bronchitis, not specified as acute or chronic: Secondary | ICD-10-CM | POA: Insufficient documentation

## 2013-09-04 MED ORDER — CHOLESTYRAMINE LIGHT 4 G PO PACK: 2.0000 g | PACK | Freq: Two times a day (BID) | ORAL | Status: DC

## 2013-09-04 NOTE — Assessment & Plan Note (Signed)
Trial of cholestyramine 2 gm daily

## 2013-09-04 NOTE — Assessment & Plan Note (Signed)
addigf flovent and cough suppressant for nornal exam

## 2013-09-05 ENCOUNTER — Encounter: Payer: Self-pay | Admitting: *Deleted

## 2013-09-20 ENCOUNTER — Encounter: Payer: Self-pay | Admitting: Internal Medicine

## 2013-09-26 ENCOUNTER — Ambulatory Visit (INDEPENDENT_AMBULATORY_CARE_PROVIDER_SITE_OTHER): Payer: Medicare PPO | Admitting: Internal Medicine

## 2013-09-26 ENCOUNTER — Ambulatory Visit (INDEPENDENT_AMBULATORY_CARE_PROVIDER_SITE_OTHER)
Admission: RE | Admit: 2013-09-26 | Discharge: 2013-09-26 | Disposition: A | Discharge status: Home / Self Care | Payer: Medicare PPO | Source: Ambulatory Visit | Attending: Internal Medicine | Admitting: Internal Medicine

## 2013-09-26 ENCOUNTER — Encounter: Payer: Self-pay | Admitting: Internal Medicine

## 2013-09-26 VITALS — BP 122/74 | HR 75 | Temp 98.4°F | Ht 66.0 in | Wt 217.2 lb

## 2013-09-26 DIAGNOSIS — J209 Acute bronchitis, unspecified: Secondary | ICD-10-CM

## 2013-09-26 MED ORDER — GUAIFENESIN-CODEINE 100-10 MG/5ML PO SYRP: 10.0000 mL | ORAL_SOLUTION | Freq: Every evening | ORAL | Status: DC | PRN

## 2013-09-26 MED ORDER — LEVOFLOXACIN 500 MG PO TABS: 500.0000 mg | ORAL_TABLET | Freq: Every day | ORAL | Status: DC

## 2013-09-26 NOTE — Assessment & Plan Note (Signed)
Symptoms and exam most consistent with early bronchitis, however unusual course of recurrent cough since early April 2015. Will get CXR today. Will start levaquin and prn codeine. Question if presence of cats in his home may be causing recurrent bronchial irritation. We also discussed potential cough from reaction to lisinopril, however this seems less likely based on exam and h/o productive cough. May consider change to Losartan if cough persistent next visit. Follow up for recheck next week.

## 2013-09-26 NOTE — Patient Instructions (Signed)
Start Levaquin 500mg  daily for bronchitis.  Use codeine syrup as needed for cough.  Chest xray today.  Follow up next week.

## 2013-09-26 NOTE — Progress Notes (Signed)
Pre visit review using our clinic review tool, if applicable. No additional management support is needed unless otherwise documented below in the visit note. 

## 2013-09-26 NOTE — Progress Notes (Signed)
Subjective:    Patient ID: Chris Price, male    DOB: 15-Feb-1941, 73 y.o.   MRN: [id number]  HPI 73YO male presents for acute visit.  Bronchitis - Cough developed after cholecystectomy in early 08/2013. Initially treated with Augmentin. Diagnosed with likely viral bronchitis 4/28. Treated with cough suppressant.  Over last few days, cough has returned. Coughing mostly at night. Productive of white sputum. No fever. Mild sore throat. No allergy symptoms. 6-7pm starts coughing. Coughs all night. Interrupts sleep. Has been unable to work. No chest pain.  Review of Systems  Constitutional: Negative for fever, chills, activity change and fatigue.  HENT: Positive for sore throat. Negative for congestion, ear discharge, ear pain, hearing loss, nosebleeds, postnasal drip, rhinorrhea, sinus pressure, sneezing, tinnitus, trouble swallowing and voice change.   Eyes: Negative for discharge, redness, itching and visual disturbance.  Respiratory: Positive for cough and wheezing. Negative for chest tightness, shortness of breath and stridor.   Cardiovascular: Negative for chest pain and leg swelling.  Musculoskeletal: Negative for arthralgias, myalgias, neck pain and neck stiffness.  Skin: Negative for color change and rash.  Neurological: Negative for dizziness, facial asymmetry and headaches.  Psychiatric/Behavioral: Negative for sleep disturbance.       Objective:    BP 122/74  Pulse 75  Temp(Src) 98.4 F (36.9 C) (Oral)  Ht 5\' 6"  (1.676 m)  Wt 217 lb 4 oz (98.544 kg)  BMI 35.08 kg/m2  SpO2 97% Physical Exam  Constitutional: He is oriented to person, place, and time. He appears well-developed and well-nourished. No distress.  HENT:  Head: Normocephalic and atraumatic.  Right Ear: External ear normal.  Left Ear: External ear normal.  Nose: Nose normal.  Mouth/Throat: Oropharynx is clear and moist. No oropharyngeal exudate.  Eyes: Conjunctivae and EOM are normal. Pupils are equal,  round, and reactive to light. Right eye exhibits no discharge. Left eye exhibits no discharge. No scleral icterus.  Neck: Normal range of motion. Neck supple. No tracheal deviation present. No thyromegaly present.  Cardiovascular: Normal rate, regular rhythm and normal heart sounds.  Exam reveals no gallop and no friction rub.   No murmur heard. Pulmonary/Chest: Effort normal. No accessory muscle usage. Not tachypneic. No respiratory distress. He has no decreased breath sounds. He has no wheezes. He has rhonchi in the right upper field and the right middle field. He has no rales. He exhibits no tenderness.  Musculoskeletal: Normal range of motion. He exhibits no edema.  Lymphadenopathy:    He has no cervical adenopathy.  Neurological: He is alert and oriented to person, place, and time. No cranial nerve deficit. Coordination normal.  Skin: Skin is warm and dry. No rash noted. He is not diaphoretic. No erythema. No pallor.  Psychiatric: He has a normal mood and affect. His behavior is normal. Judgment and thought content normal.          Assessment & Plan:   Problem List Items Addressed This Visit     Unprioritized   Acute bronchitis - Primary     Symptoms and exam most consistent with early bronchitis, however unusual course of recurrent cough since early April 2015. Will get CXR today. Will start levaquin and prn codeine. Question if presence of cats in his home may be causing recurrent bronchial irritation. We also discussed potential cough from reaction to lisinopril, however this seems less likely based on exam and h/o productive cough. May consider change to Losartan if cough persistent next visit. Follow up for recheck next  week.    Relevant Medications      levofloxacin (LEVAQUIN) tablet      guaiFENesin-codeine (CHERATUSSIN AC) 100-10 MG/5ML syrup   Other Relevant Orders      DG Chest 2 View       Return in about 1 week (around 10/03/2013).

## 2013-09-27 NOTE — Progress Notes (Signed)
Notified pt. 

## 2013-10-09 ENCOUNTER — Encounter: Payer: Self-pay | Admitting: Internal Medicine

## 2013-10-09 ENCOUNTER — Ambulatory Visit (INDEPENDENT_AMBULATORY_CARE_PROVIDER_SITE_OTHER): Payer: Medicare PPO | Admitting: Internal Medicine

## 2013-10-09 VITALS — BP 112/74 | HR 75 | Temp 97.6°F | Resp 12 | Ht 66.0 in | Wt 210.5 lb

## 2013-10-09 DIAGNOSIS — Z9889 Other specified postprocedural states: Secondary | ICD-10-CM

## 2013-10-09 DIAGNOSIS — R197 Diarrhea, unspecified: Secondary | ICD-10-CM

## 2013-10-09 DIAGNOSIS — E669 Obesity, unspecified: Secondary | ICD-10-CM

## 2013-10-09 DIAGNOSIS — T8189XA Other complications of procedures, not elsewhere classified, initial encounter: Secondary | ICD-10-CM

## 2013-10-09 DIAGNOSIS — J209 Acute bronchitis, unspecified: Secondary | ICD-10-CM

## 2013-10-09 DIAGNOSIS — I25810 Atherosclerosis of coronary artery bypass graft(s) without angina pectoris: Secondary | ICD-10-CM

## 2013-10-09 MED ORDER — BENZONATATE 100 MG PO CAPS: 100.0000 mg | ORAL_CAPSULE | Freq: Three times a day (TID) | ORAL | Status: DC | PRN

## 2013-10-09 MED ORDER — GUAIFENESIN-CODEINE 100-10 MG/5ML PO SYRP: 10.0000 mL | ORAL_SOLUTION | Freq: Every evening | ORAL | Status: DC | PRN

## 2013-10-09 NOTE — Patient Instructions (Signed)
Try switching to G2  For  hydrating after sweating a lot   Also can try tonic water one ounce before bedtime for the quinine   Congratulations on the weight loss!  Keep going,  Your goal is 185 lbs (over the next year )   Return for fasting labs before your appt with [facility] Anon

## 2013-10-09 NOTE — Progress Notes (Signed)
Pre visit review using our clinic review tool, if applicable. No additional management support is needed unless otherwise documented below in the visit note. 

## 2013-10-09 NOTE — Progress Notes (Signed)
Patient ID: Chris Price, male   DOB: [birth year], 73 y.o.   MRN: [id number]   Patient Active Problem List   Diagnosis Date Noted  . Acute bronchitis 09/26/2013  . Diarrhea following gastrointestinal surgery 09/04/2013  . S/P laparoscopic cholecystectomy 08/20/2013  . Hypomagnesemia 08/08/2013  . Chronic atrial fibrillation 07/15/2013  . Chest pain 07/15/2013  . Obesity, unspecified 02/28/2013  . OSA on CPAP 01/03/2013  . Hyperlipidemia LDL goal < 70 03/22/2012  . Bladder outlet obstruction 12/25/2011  . Hyperglycemia 12/23/2011  . Essential tremor 12/22/2011  . Paroxysmal atrial fibrillation 11/07/2011  . Hypertension   . Hearing loss in right ear 09/20/2011  . Elevated PSA, less than 10 ng/ml 09/20/2011  . Automatic implantable cardioverter-defibrillator Medtronic dual-chamber 09/09/2011  . Chronic systolic heart failure 40/98/1191  . IVCD (intraventricular conduction defect)   . Cardiomyopathy   . Coronary artery disease 08/15/2011    Subjective:  CC:   Chief Complaint  Patient presents with  . Follow-up    Bronchitis. Cough has improved, maybe a little cough at night    HPI:   Chris Price is a 73 y.o. male who presents for Follow up on prolonged productive cough that started in late April post operatively and cholecsytectomy .  He was initially treated with augmentin x 1 weeks without  Followed by prednisone taper and cough suppressants, then treated with levaquin last week by Dr. Gilford Rile and sent for chest x ray which was normal. Symptoms have finally resolved.  He is intentionally losing weight. Having some leg cramps at night after [place].  Does a lot of walking ,. No claudication symptoms.  Uses water to hydrate.    Past Medical History  Diagnosis Date  . Lumbar spinal stenosis   . Degenerative disk disease     lumbar  . Disc herniation   . Hyperlipidemia   . Cardiomyopathy -nonischemic     moderate to severe EF 25-30% March 2013   . Chronic systolic heart failure 4/78/2956  . ICD (implantable cardiac defibrillator) in place   . CHF (congestive heart failure)   . Sleep apnea     uses cpap  . GERD (gastroesophageal reflux disease)   . Neuromuscular disorder     neauropathy   / siatica  . Hypertension   . Atrial fibrillation   . IVCD (intraventricular conduction defect)     Nonspecific QRS duration 122  . Persistent atrial fibrillation 11/07/2011  . Coronary artery disease 08/15/2011    Moderate nonobstructive CAD with 60% mid LAD, 60% proximal RCA and 30 % stenossis left circumflex. Cardiac catheterization in March of 2015 showed no significant change     Past Surgical History  Procedure Laterality Date  . Lumbar laminectomy      L2 through S1 with wide decompression of the thecal sac and nerve roots.  . Cardioversion  08/2011    [facility]  . Hernia repair    . Icd  09/08/2011  . Back surgery    . Cooled thermotherapy  2130    Wolfe, complicated by incontinence  . Prostate biopsy  2013  . Cardiac catheterization  08/15/2011    [facility]   . Cardiac catheterization  07/09/2013    [facility]  . Cholecystectomy  April 2015       The following portions of the patient's history were reviewed and updated as appropriate: Allergies, current medications, and problem list.    Review of Systems:   Patient denies headache, fevers, malaise,  unintentional weight loss, skin rash, eye pain, sinus congestion and sinus pain, sore throat, dysphagia,  hemoptysis , cough, dyspnea, wheezing, chest pain, palpitations, orthopnea, edema, abdominal pain, nausea, melena, diarrhea, constipation, flank pain, dysuria, hematuria, urinary  Frequency, nocturia, numbness, tingling, seizures,  Focal weakness, Loss of consciousness,  Tremor, insomnia, depression, anxiety, and suicidal ideation.     History   Social History  . Marital Status: Married    Spouse Name: N/A    Number of Children: N/A  . Years of Education: N/A   Occupational History  .  Not on file.   Social History Main Topics  . Smoking status: Never Smoker   . Smokeless tobacco: Never Used  . Alcohol Use: No  . Drug Use: No  . Sexual Activity: Not Currently   Other Topics Concern  . Not on file   Social History Narrative  . No narrative on file    Objective:  Filed Vitals:   10/09/13 1521  BP: 112/74  Pulse: 75  Temp: 97.6 F (36.4 C)  Resp: 12     General appearance: alert, cooperative and appears stated age Ears: normal TM's and external ear canals both ears Throat: lips, mucosa, and tongue normal; teeth and gums normal Neck: no adenopathy, no carotid bruit, supple, symmetrical, trachea midline and thyroid not enlarged, symmetric, no tenderness/mass/nodules Back: symmetric, no curvature. ROM normal. No CVA tenderness. Lungs: clear to auscultation bilaterally Heart: regular rate and rhythm, S1, S2 normal, no murmur, click, rub or gallop Abdomen: soft, non-tender; bowel sounds normal; no masses,  no organomegaly Pulses: 2+ and symmetric Skin: Skin color, texture, turgor normal. No rashes or lesions Lymph nodes: Cervical, supraclavicular, and axillary nodes normal.  Assessment and Plan:  Acute bronchitis Finally resolved after 6 weeks of cough .  Chest x ray normal . Question whether this could have been due to legionella given failure to improve on augmentin and history of  chronic use of CPAP for OSA  Diarrhea following gastrointestinal surgery Will consider Trial of cholestyramine 2 gm daily if symptoms persist    Obesity, unspecified I have congratulated him in reduction of   BMI and encouraged  Continued weight loss with goal of 10% of body weight over the next 6 months using a low glycemic index diet and regular exercise a minimum of 5 days per week.     Updated Medication List Outpatient Encounter Prescriptions as of 10/09/2013  Medication Sig  . atorvastatin (LIPITOR) 80 MG tablet TAKE 1 TABLET EVERY DAY  . carvedilol (COREG) 12.5  MG tablet Take 2 tablets (25 mg total) by mouth 2 (two) times daily with a meal.  . Coenzyme Q10-Levocarnitine (CO Q-10 PLUS PO) Take 1 tablet by mouth daily.   . finasteride (PROSCAR) 5 MG tablet Take 5 mg by mouth daily.  . Flunisolide HFA 80 MCG/ACT AERS Inhale 2 puffs into the lungs 2 (two) times daily.  . furosemide (LASIX) 20 MG tablet TAKE 1 TABLET EVERY DAY  . guaiFENesin-codeine (CHERATUSSIN AC) 100-10 MG/5ML syrup Take 10 mLs by mouth at bedtime as needed for cough.  . Lactobacillus-Inulin ([place]) CAPS Take 1 capsule by mouth daily.  Marland Kitchen lisinopril (PRINIVIL,ZESTRIL) 20 MG tablet Take 0.5 tablets (10 mg total) by mouth daily.  . nitroGLYCERIN (NITROSTAT) 0.4 MG SL tablet Place 0.4 mg under the tongue every 5 (five) minutes as needed for chest pain.  Marland Kitchen omeprazole (PRILOSEC) 20 MG capsule Take 20 mg by mouth 2 (two) times daily before a meal.  Acid reflux.  Marland Kitchen oxyCODONE-acetaminophen (PERCOCET/ROXICET) 5-325 MG per tablet Take 1 tablet by mouth every 8 (eight) hours as needed.  . Rivaroxaban (XARELTO) 20 MG TABS tablet Take 1 tablet (20 mg total) by mouth daily with supper.  Marland Kitchen spironolactone (ALDACTONE) 12.5 mg TABS Take 0.5 tablets (12.5 mg total) by mouth daily.  . Tamsulosin HCl (FLOMAX) 0.4 MG CAPS Take 0.4 mg by mouth daily.  . traMADol (ULTRAM) 50 MG tablet Take 1 tablet (50 mg total) by mouth daily as needed for pain.  . [DISCONTINUED] guaiFENesin-codeine (CHERATUSSIN AC) 100-10 MG/5ML syrup Take 10 mLs by mouth at bedtime as needed for cough.  . benzonatate (TESSALON) 100 MG capsule Take 1 capsule (100 mg total) by mouth 3 (three) times daily as needed for cough.  . [DISCONTINUED] benzonatate (TESSALON) 100 MG capsule Take 1 capsule (100 mg total) by mouth 3 (three) times daily as needed for cough.  . [DISCONTINUED] cholestyramine light (PREVALITE) 4 G packet Take 0.5 packets (2 g total) by mouth 2 (two) times daily.  . [DISCONTINUED] levofloxacin (LEVAQUIN) 500  MG tablet Take 1 tablet (500 mg total) by mouth daily.  . [DISCONTINUED] Magnesium Oxide 400 MG CAPS Take 1 capsule (400 mg total) by mouth 2 (two) times daily after a meal.     Orders Placed This Encounter  Procedures  . Ambulatory referral to Cardiology    No Follow-up on file.

## 2013-10-12 ENCOUNTER — Encounter: Payer: Self-pay | Admitting: Internal Medicine

## 2013-10-12 NOTE — Assessment & Plan Note (Addendum)
Will consider Trial of cholestyramine 2 gm daily if symptoms persist

## 2013-10-12 NOTE — Assessment & Plan Note (Addendum)
Finally resolved after 6 weeks of cough .  Chest x ray normal . Question whether this could have been due to legionella given failure to improve on augmentin and history of  chronic use of CPAP for OSA

## 2013-10-12 NOTE — Assessment & Plan Note (Signed)
I have congratulated him in reduction of   BMI and encouraged  Continued weight loss with goal of 10% of body weight over the next 6 months using a low glycemic index diet and regular exercise a minimum of 5 days per week.

## 2013-10-14 ENCOUNTER — Telehealth: Payer: Self-pay | Admitting: *Deleted

## 2013-10-14 ENCOUNTER — Encounter: Payer: Self-pay | Admitting: Internal Medicine

## 2013-10-14 ENCOUNTER — Other Ambulatory Visit (INDEPENDENT_AMBULATORY_CARE_PROVIDER_SITE_OTHER): Payer: Medicare PPO

## 2013-10-14 DIAGNOSIS — E785 Hyperlipidemia, unspecified: Secondary | ICD-10-CM

## 2013-10-14 DIAGNOSIS — Z79899 Other long term (current) drug therapy: Secondary | ICD-10-CM

## 2013-10-14 LAB — COMPREHENSIVE METABOLIC PANEL
ALT: 27 U/L (ref 0–53)
AST: 28 U/L (ref 0–37)
Albumin: 3.9 g/dL (ref 3.5–5.2)
Alkaline Phosphatase: 53 U/L (ref 39–117)
BUN: 20 mg/dL (ref 6–23)
CO2: 23 meq/L (ref 19–32)
CREATININE: 0.9 mg/dL (ref 0.4–1.5)
Calcium: 9.1 mg/dL (ref 8.4–10.5)
Chloride: 108 mEq/L (ref 96–112)
GFR: 87.5 mL/min (ref >60.00)
GLUCOSE: 95 mg/dL (ref 70–99)
Potassium: 4.5 mEq/L (ref 3.5–5.1)
Sodium: 141 mEq/L (ref 135–145)
Total Bilirubin: 1.2 mg/dL (ref 0.2–1.2)
Total Protein: 7.1 g/dL (ref 6.0–8.3)

## 2013-10-14 LAB — LIPID PANEL
CHOLESTEROL: 184 mg/dL (ref 0–200)
HDL: 38.9 mg/dL — ABNORMAL LOW (ref >39.00)
LDL Cholesterol: 127 mg/dL — ABNORMAL HIGH (ref 0–99)
NonHDL: 145.1
Total CHOL/HDL Ratio: 5
Triglycerides: 91 mg/dL (ref 0.0–149.0)
VLDL: 18.2 mg/dL (ref 0.0–40.0)

## 2013-10-14 NOTE — Telephone Encounter (Signed)
What labs and dx?  

## 2013-10-15 ENCOUNTER — Ambulatory Visit (INDEPENDENT_AMBULATORY_CARE_PROVIDER_SITE_OTHER): Payer: Medicare PPO | Admitting: Cardiovascular Disease

## 2013-10-15 ENCOUNTER — Encounter: Payer: Self-pay | Admitting: Cardiovascular Disease

## 2013-10-15 VITALS — BP 110/84 | HR 90 | Ht 66.0 in | Wt 209.5 lb

## 2013-10-15 DIAGNOSIS — I2510 Atherosclerotic heart disease of native coronary artery without angina pectoris: Secondary | ICD-10-CM

## 2013-10-15 DIAGNOSIS — I5022 Chronic systolic (congestive) heart failure: Secondary | ICD-10-CM

## 2013-10-15 DIAGNOSIS — E785 Hyperlipidemia, unspecified: Secondary | ICD-10-CM

## 2013-10-15 DIAGNOSIS — I4891 Unspecified atrial fibrillation: Secondary | ICD-10-CM

## 2013-10-15 DIAGNOSIS — I482 Chronic atrial fibrillation, unspecified: Secondary | ICD-10-CM

## 2013-10-15 NOTE — Assessment & Plan Note (Signed)
Lab Results  Component Value Date   CHOL 184 10/14/2013   HDL 38.90* 10/14/2013   LDLCALC 127* 10/14/2013   LDLDIRECT 143.6 03/21/2012   TRIG 91.0 10/14/2013   CHOLHDL 5 10/14/2013   Continue high dose Atorvastatin.

## 2013-10-15 NOTE — Assessment & Plan Note (Signed)
Recent cardiac catheterization showed no significant progression of coronary artery disease.

## 2013-10-15 NOTE — Assessment & Plan Note (Signed)
Rate is reasonably controlled with Coreg. Continue anticoagulation with Xarelto.

## 2013-10-15 NOTE — Patient Instructions (Signed)
Your physician wants you to follow-up in: 6 months with Dr. [name]. You will receive a reminder letter in the mail two months in advance. If you don't receive a letter, please call our office to schedule the follow-up appointment.  Your physician recommends that you continue on your current medications as directed. Please refer to the Current Medication list given to you today.   

## 2013-10-15 NOTE — Assessment & Plan Note (Signed)
Most recent EF was 35%. He seems to be euvolemic. Currently in NYHA class 2.

## 2013-10-15 NOTE — Progress Notes (Signed)
Primary care physician: Dr. Derrel Nip  HPI  Chris Price is a pleasant 73 year old male who is here today for a followup visit. He has no history of chronic systolic heart failure due to nonischemic cardiomyopathy, chronic atrial fibrillation and moderate nonobstructive coronary artery disease.  He is status post dual-chamber ICD placement by Dr. Caryl Comes.  He was hospitalized at [facility] in 07/2013 after he presented with chest pain radiating to his back associated with diaphoresis and tachycardia. He was in atrial fibrillation with rapid ventricular response. He underwent cardiac catheterization which showed no significant change in coronary artery disease with no evidence of obstructive lesions. EF was 35%. The dose of carvedilol was increased in order to control her ventricular rate. Lisinopril was decreased. His symptoms were felt to be GI in nature. He continues to have symptoms and was found to have chololithiasis. He underwent cholecystectomy without complications. He has been doing well. No chest pain or dyspnea.     No Known Allergies   Current Outpatient Prescriptions on File Prior to Visit  Medication Sig Dispense Refill  . atorvastatin (LIPITOR) 80 MG tablet TAKE 1 TABLET EVERY DAY  90 tablet  1  . carvedilol (COREG) 12.5 MG tablet Take 2 tablets (25 mg total) by mouth 2 (two) times daily with a meal.  180 tablet  3  . finasteride (PROSCAR) 5 MG tablet Take 5 mg by mouth daily.      . furosemide (LASIX) 20 MG tablet TAKE 1 TABLET EVERY DAY  90 tablet  PRN  . lisinopril (PRINIVIL,ZESTRIL) 20 MG tablet Take 0.5 tablets (10 mg total) by mouth daily.  30 tablet  3  . nitroGLYCERIN (NITROSTAT) 0.4 MG SL tablet Place 0.4 mg under the tongue every 5 (five) minutes as needed for chest pain.      Marland Kitchen omeprazole (PRILOSEC) 20 MG capsule Take 20 mg by mouth 2 (two) times daily before a meal. Acid reflux.      . Rivaroxaban (XARELTO) 20 MG TABS tablet Take 1 tablet (20 mg total) by mouth daily with  supper.  30 tablet  6  . spironolactone (ALDACTONE) 12.5 mg TABS Take 0.5 tablets (12.5 mg total) by mouth daily.  45 tablet  3  . Tamsulosin HCl (FLOMAX) 0.4 MG CAPS Take 0.4 mg by mouth daily.       No current facility-administered medications on file prior to visit.     Past Medical History  Diagnosis Date  . Lumbar spinal stenosis   . Degenerative disk disease     lumbar  . Disc herniation   . Hyperlipidemia   . Cardiomyopathy -nonischemic     moderate to severe EF 25-30% March 2013  . Chronic systolic heart failure 0/02/9322  . ICD (implantable cardiac defibrillator) in place   . CHF (congestive heart failure)   . Sleep apnea     uses cpap  . GERD (gastroesophageal reflux disease)   . Neuromuscular disorder     neauropathy   / siatica  . Hypertension   . Atrial fibrillation   . IVCD (intraventricular conduction defect)     Nonspecific QRS duration 122  . Persistent atrial fibrillation 11/07/2011  . Coronary artery disease 08/15/2011    Moderate nonobstructive CAD with 60% mid LAD, 60% proximal RCA and 30 % stenossis left circumflex. Cardiac catheterization in March of 2015 showed no significant change      Past Surgical History  Procedure Laterality Date  . Lumbar laminectomy      L2  through S1 with wide decompression of the thecal sac and nerve roots.  . Cardioversion  08/2011    [facility]  . Hernia repair    . Icd  09/08/2011  . Back surgery    . Cooled thermotherapy  7829    Wolfe, complicated by incontinence  . Prostate biopsy  2013  . Cardiac catheterization  08/15/2011    [facility]   . Cardiac catheterization  07/09/2013    [facility]  . Cholecystectomy  April 2015     Family History  Problem Relation Age of Onset  . Heart attack Father   . Heart attack Brother      History   Social History  . Marital Status: Married    Spouse Name: N/A    Number of Children: N/A  . Years of Education: N/A   Occupational History  . Not on file.   Social History Main Topics    . Smoking status: Never Smoker   . Smokeless tobacco: Never Used  . Alcohol Use: No  . Drug Use: No  . Sexual Activity: Not Currently   Other Topics Concern  . Not on file   Social History Narrative  . No narrative on file     PHYSICAL EXAM   BP 110/84  Pulse 90  Ht 5\' 6"  (1.676 m)  Wt 209 lb 8 oz (95.029 kg)  BMI 33.83 kg/m2 Constitutional: He is oriented to person, place, and time. He appears well-developed and well-nourished. No distress.  HENT: No nasal discharge.  Head: Normocephalic and atraumatic.  Eyes: Pupils are equal and round. Right eye exhibits no discharge. Left eye exhibits no discharge.  Neck: Normal range of motion. Neck supple. No JVD present. No thyromegaly present.  Cardiovascular: Normal rate, irregular rhythm, normal heart sounds. Exam reveals no gallop and no friction rub. No murmur heard.  Pulmonary/Chest: Effort normal and breath sounds normal. No stridor. No respiratory distress. He has no wheezes. He has no rales. He exhibits no tenderness.  Abdominal: Soft. Bowel sounds are normal. He exhibits no distension. There is no tenderness. There is no rebound and no guarding.  Musculoskeletal: Normal range of motion. He exhibits no edema and no tenderness.  Neurological: He is alert and oriented to person, place, and time. Coordination normal.  Skin: Skin is warm and dry. No rash noted. He is not diaphoretic. No erythema. No pallor.  Psychiatric: He has a normal mood and affect. His behavior is normal. Judgment and thought content normal.       EKG: Atrial fibrillation  - frequent ectopic ventricular beat s  # VECs = 2 -Nonspecific QRS widening and anterior fascicular block.   -Poor R-wave progression -nonspecific -consider old anterior infarct.   -  Nonspecific T-abnormality.   ABNORMAL   ASSESSMENT AND PLAN

## 2013-11-04 ENCOUNTER — Ambulatory Visit (INDEPENDENT_AMBULATORY_CARE_PROVIDER_SITE_OTHER): Payer: Medicare PPO | Admitting: *Deleted

## 2013-11-04 DIAGNOSIS — I429 Cardiomyopathy, unspecified: Secondary | ICD-10-CM

## 2013-11-04 DIAGNOSIS — I482 Chronic atrial fibrillation, unspecified: Secondary | ICD-10-CM

## 2013-11-04 DIAGNOSIS — I428 Other cardiomyopathies: Secondary | ICD-10-CM

## 2013-11-04 DIAGNOSIS — I5022 Chronic systolic (congestive) heart failure: Secondary | ICD-10-CM

## 2013-11-04 DIAGNOSIS — I4891 Unspecified atrial fibrillation: Secondary | ICD-10-CM

## 2013-11-04 LAB — MDC_IDC_ENUM_SESS_TYPE_REMOTE
Brady Statistic AP VS Percent: 0.84 %
Brady Statistic AS VP Percent: 8.1 %
Brady Statistic AS VS Percent: 89.97 %
Brady Statistic RA Percent Paced: 1.93 %
Brady Statistic RV Percent Paced: 9.19 %
HIGH POWER IMPEDANCE MEASURED VALUE: 66 Ohm
HighPow Impedance: 247 Ohm
HighPow Impedance: 399 Ohm
Lead Channel Impedance Value: 475 Ohm
Lead Channel Pacing Threshold Amplitude: 0.375 V
Lead Channel Pacing Threshold Amplitude: 1.25 V
Lead Channel Pacing Threshold Pulse Width: 0.4 ms
Lead Channel Pacing Threshold Pulse Width: 0.4 ms
Lead Channel Sensing Intrinsic Amplitude: 0.75 mV
Lead Channel Sensing Intrinsic Amplitude: 7.125 mV
Lead Channel Setting Pacing Amplitude: 2 V
Lead Channel Setting Pacing Amplitude: 2.75 V
Lead Channel Setting Pacing Pulse Width: 0.4 ms
Lead Channel Setting Sensing Sensitivity: 0.3 mV
MDC IDC MSMT BATTERY VOLTAGE: 3.08 V
MDC IDC MSMT LEADCHNL RA SENSING INTR AMPL: 0.75 mV
MDC IDC MSMT LEADCHNL RV IMPEDANCE VALUE: 532 Ohm
MDC IDC MSMT LEADCHNL RV SENSING INTR AMPL: 7.125 mV
MDC IDC SESS DTM: 20150629153434
MDC IDC SET ZONE DETECTION INTERVAL: 300 ms
MDC IDC STAT BRADY AP VP PERCENT: 1.09 %
Zone Setting Detection Interval: 250 ms
Zone Setting Detection Interval: 350 ms
Zone Setting Detection Interval: 360 ms

## 2013-11-04 NOTE — Progress Notes (Signed)
Remote ICD transmission.   

## 2013-11-11 ENCOUNTER — Telehealth: Payer: Self-pay | Admitting: Internal Medicine

## 2013-11-11 DIAGNOSIS — R0602 Shortness of breath: Secondary | ICD-10-CM

## 2013-11-11 MED ORDER — SPIRONOLACTONE 12.5 MG HALF TABLET: 12.5000 mg | ORAL_TABLET | Freq: Every day | ORAL | Status: DC

## 2013-11-11 MED ORDER — LISINOPRIL 20 MG PO TABS: 10.0000 mg | ORAL_TABLET | Freq: Every day | ORAL | Status: DC

## 2013-11-11 MED ORDER — CARVEDILOL 12.5 MG PO TABS: 25.0000 mg | ORAL_TABLET | Freq: Two times a day (BID) | ORAL | Status: DC

## 2013-11-11 MED ORDER — FUROSEMIDE 20 MG PO TABS: ORAL_TABLET | ORAL | Status: DC

## 2013-11-11 NOTE — Telephone Encounter (Signed)
Refills sent

## 2013-11-11 NOTE — Telephone Encounter (Addendum)
furosemide (LASIX) 20 MG tablet  Send to [facility]  spironolactone (ALDACTONE) 12.5 mg TABS  Send to [facility]   lisinopril (PRINIVIL,ZESTRIL) 20 MG tablet Send to mail order  carvedilol (COREG) 12.5 MG tablet  Send to mail order

## 2013-11-12 ENCOUNTER — Encounter: Payer: Self-pay | Admitting: Internal Medicine

## 2013-11-20 ENCOUNTER — Encounter: Payer: Self-pay | Admitting: Cardiology

## 2013-11-22 ENCOUNTER — Encounter: Payer: Self-pay | Admitting: Cardiology

## 2013-11-26 ENCOUNTER — Encounter: Payer: Self-pay | Admitting: Internal Medicine

## 2013-12-19 ENCOUNTER — Other Ambulatory Visit: Payer: Self-pay

## 2013-12-19 DIAGNOSIS — R0602 Shortness of breath: Secondary | ICD-10-CM

## 2013-12-19 MED ORDER — SPIRONOLACTONE 12.5 MG HALF TABLET: 12.5000 mg | ORAL_TABLET | Freq: Every day | ORAL | Status: DC

## 2013-12-19 MED ORDER — ATORVASTATIN CALCIUM 80 MG PO TABS: ORAL_TABLET | ORAL | Status: DC

## 2013-12-19 MED ORDER — LISINOPRIL 10 MG PO TABS: 10.0000 mg | ORAL_TABLET | Freq: Every day | ORAL | Status: DC

## 2013-12-19 MED ORDER — SPIRONOLACTONE 25 MG PO TABS: 12.5000 mg | ORAL_TABLET | Freq: Every day | ORAL | Status: DC

## 2013-12-19 MED ORDER — FUROSEMIDE 20 MG PO TABS: ORAL_TABLET | ORAL | Status: DC

## 2013-12-19 NOTE — Telephone Encounter (Signed)
Refill sent for spironolactone  

## 2013-12-19 NOTE — Telephone Encounter (Signed)
Refill sent for lipitor, spironolactone, furosemide and lisinopril.

## 2014-02-05 ENCOUNTER — Other Ambulatory Visit: Payer: Self-pay

## 2014-02-05 ENCOUNTER — Telehealth: Payer: Self-pay

## 2014-02-05 MED ORDER — RIVAROXABAN 20 MG PO TABS: 20.0000 mg | ORAL_TABLET | Freq: Every day | ORAL | Status: DC

## 2014-02-05 NOTE — Telephone Encounter (Signed)
Pt states he is out of Xarelto and needs some samples

## 2014-02-18 ENCOUNTER — Ambulatory Visit (INDEPENDENT_AMBULATORY_CARE_PROVIDER_SITE_OTHER): Payer: Medicare PPO | Admitting: Internal Medicine

## 2014-02-18 ENCOUNTER — Encounter: Payer: Self-pay | Admitting: Internal Medicine

## 2014-02-18 VITALS — BP 115/74 | HR 77 | Ht 66.0 in | Wt 215.5 lb

## 2014-02-18 DIAGNOSIS — I482 Chronic atrial fibrillation, unspecified: Secondary | ICD-10-CM

## 2014-02-18 DIAGNOSIS — I429 Cardiomyopathy, unspecified: Secondary | ICD-10-CM

## 2014-02-18 NOTE — Patient Instructions (Signed)
Remote monitoring is used to monitor your Pacemaker of ICD from home. This monitoring reduces the number of office visits required to check your device to one time per year. It allows [place] to keep an eye on the functioning of your device to ensure it is working properly. You are scheduled for a device check from home on 05/21/14. You may send your transmission at any time that day. If you have a wireless device, the transmission will be sent automatically. After your physician reviews your transmission, you will receive a postcard with your next transmission date.   Your physician wants you to follow-up in: 6 months with Dr. Fletcher Anon. You will receive a reminder letter in the mail two months in advance. If you don't receive a letter, please call our office to schedule the follow-up appointment.  Your physician wants you to follow-up in: 1 year with Dr. Caryl Comes. You will receive a reminder letter in the mail two months in advance. If you don't receive a letter, please call our office to schedule the follow-up appointment.  Your next appointment will be scheduled in our new office located at :  [facility]  [address] [place]  [place], [place] [address]

## 2014-02-18 NOTE — Progress Notes (Signed)
Patient Care Team: Crecencio Mc, MD as PCP - General (Internal Medicine)   HPI  Chris Price is a 73 y.o. male Seen in followup for an ICD implanted for primary prevention in nonischemic cardiomyopathy further complicated by sinus bradycardia-date of implant May 2013. He also has a history of atrial fibrillation for which he was managed with dabigitran and amiodarone. He developed a significant intention tremor prompting the down titration and subsequent discontinuation of his amiodarone  There have been no appreciable change in his tremor. The patient denies chest pain, shortness of breath, nocturnal dyspnea, orthopnea or peripheral edema.  There have been no palpitations, lightheadedness or syncope.         Past Medical History  Diagnosis Date  . Lumbar spinal stenosis   . Degenerative disk disease     lumbar  . Disc herniation   . Hyperlipidemia   . Cardiomyopathy -nonischemic     moderate to severe EF 25-30% March 2013  . Chronic systolic heart failure 0/98/1191  . ICD (implantable cardiac defibrillator) in place   . CHF (congestive heart failure)   . Sleep apnea     uses cpap  . GERD (gastroesophageal reflux disease)   . Neuromuscular disorder     neauropathy   / siatica  . Hypertension   . Atrial fibrillation   . IVCD (intraventricular conduction defect)     Nonspecific QRS duration 122  . Persistent atrial fibrillation 11/07/2011  . Coronary artery disease 08/15/2011    Moderate nonobstructive CAD with 60% mid LAD, 60% proximal RCA and 30 % stenossis left circumflex. Cardiac catheterization in March of 2015 showed no significant change     Past Surgical History  Procedure Laterality Date  . Lumbar laminectomy      L2 through S1 with wide decompression of the thecal sac and nerve roots.  . Cardioversion  08/2011    [facility]  . Hernia repair    . Icd  09/08/2011  . Back surgery    . Cooled thermotherapy  4782    Wolfe, complicated by incontinence  . Prostate  biopsy  2013  . Cardiac catheterization  08/15/2011    [facility]   . Cardiac catheterization  07/09/2013    [facility]  . Cholecystectomy  April 2015    Current Outpatient Prescriptions  Medication Sig Dispense Refill  . atorvastatin (LIPITOR) 80 MG tablet TAKE 1 TABLET EVERY DAY  90 tablet  3  . carvedilol (COREG) 12.5 MG tablet Take 12.5 mg by mouth 2 (two) times daily with a meal.      . finasteride (PROSCAR) 5 MG tablet Take 5 mg by mouth daily.      . furosemide (LASIX) 20 MG tablet TAKE 1 TABLET EVERY DAY  90 tablet  3  . lisinopril (PRINIVIL,ZESTRIL) 10 MG tablet Take 1 tablet (10 mg total) by mouth daily.  90 tablet  3  . nitroGLYCERIN (NITROSTAT) 0.4 MG SL tablet Place 0.4 mg under the tongue every 5 (five) minutes as needed for chest pain.      Marland Kitchen omeprazole (PRILOSEC) 20 MG capsule Take 20 mg by mouth 2 (two) times daily before a meal. Acid reflux.      . rivaroxaban (XARELTO) 20 MG TABS tablet Take 1 tablet (20 mg total) by mouth daily with supper.  90 tablet  3  . spironolactone (ALDACTONE) 25 MG tablet Take 0.5 tablets (12.5 mg total) by mouth daily.  135 tablet  3  . Tamsulosin HCl (FLOMAX) 0.4 MG  CAPS Take 0.4 mg by mouth daily.       No current facility-administered medications for this visit.    No Known Allergies  Review of Systems negative except from HPI and PMH  Physical Exam BP 115/74  Pulse 77  Ht 5\' 6"  (1.676 m)  Wt 215 lb 8 oz (97.75 kg)  BMI 34.80 kg/m2 Well developed and well nourished in no acute distress HENT normal E scleral and icterus clear Neck Supple JVP flat; carotids brisk and full Clear to ausculation  Device pocket well healed; without hematoma or erythema.  There is no tethering  Irregularly irregular Regular rate and rhythm, no murmurs gallops or rub Soft with active bowel sounds No clubbing cyanosis none Edema Alert and oriented, grossly normal motor and sensory function; mild tremor Skin Warm and Dry  ECG  EFib 77  -/13/42  Assessment and   Plan  NICM  Afib-permanent  ICD-MDT The patient's device was interrogated.  The information was reviewed. No changes were made in the programming.    Labs ok 6/15   Euvolemic  Continue anticoagulation

## 2014-02-21 LAB — MDC_IDC_ENUM_SESS_TYPE_INCLINIC
Brady Statistic AP VP Percent: 0.91 %
Brady Statistic AP VS Percent: 0.66 %
Brady Statistic AS VS Percent: 82.57 %
Brady Statistic RA Percent Paced: 1.58 %
HighPow Impedance: 247 Ohm
HighPow Impedance: 399 Ohm
HighPow Impedance: 68 Ohm
Lead Channel Impedance Value: 532 Ohm
Lead Channel Pacing Threshold Amplitude: 1 V
Lead Channel Pacing Threshold Pulse Width: 0.4 ms
Lead Channel Sensing Intrinsic Amplitude: 7 mV
Lead Channel Setting Pacing Amplitude: 2 V
Lead Channel Setting Pacing Pulse Width: 0.4 ms
Lead Channel Setting Sensing Sensitivity: 0.3 mV
MDC IDC MSMT BATTERY VOLTAGE: 3.06 V
MDC IDC MSMT LEADCHNL RA IMPEDANCE VALUE: 475 Ohm
MDC IDC MSMT LEADCHNL RA SENSING INTR AMPL: 0.625 mV
MDC IDC MSMT LEADCHNL RA SENSING INTR AMPL: 0.875 mV
MDC IDC MSMT LEADCHNL RV SENSING INTR AMPL: 8.125 mV
MDC IDC SESS DTM: 20151013141452
MDC IDC SET LEADCHNL RA PACING AMPLITUDE: 2.75 V
MDC IDC SET ZONE DETECTION INTERVAL: 250 ms
MDC IDC SET ZONE DETECTION INTERVAL: 300 ms
MDC IDC STAT BRADY AS VP PERCENT: 15.85 %
MDC IDC STAT BRADY RV PERCENT PACED: 16.77 %
Zone Setting Detection Interval: 350 ms
Zone Setting Detection Interval: 360 ms

## 2014-03-25 ENCOUNTER — Telehealth: Payer: Self-pay | Admitting: Cardiovascular Disease

## 2014-03-25 NOTE — Telephone Encounter (Signed)
Pt wife called asking if we had samples of XARELTO for pt is in donut hole and does not have any more.

## 2014-03-27 NOTE — Telephone Encounter (Signed)
Notified patient samples available to pick up at the front desk for [facility].

## 2014-04-17 ENCOUNTER — Encounter (HOSPITAL_COMMUNITY): Payer: Self-pay | Admitting: Internal Medicine

## 2014-04-29 ENCOUNTER — Encounter: Payer: Self-pay | Admitting: *Deleted

## 2014-05-13 NOTE — Telephone Encounter (Signed)
Mailed unread message to pt  

## 2014-05-21 ENCOUNTER — Ambulatory Visit (INDEPENDENT_AMBULATORY_CARE_PROVIDER_SITE_OTHER): Payer: Medicare PPO | Admitting: *Deleted

## 2014-05-21 DIAGNOSIS — I5022 Chronic systolic (congestive) heart failure: Secondary | ICD-10-CM

## 2014-05-21 DIAGNOSIS — Z95810 Presence of automatic (implantable) cardiac defibrillator: Secondary | ICD-10-CM

## 2014-05-21 DIAGNOSIS — I429 Cardiomyopathy, unspecified: Secondary | ICD-10-CM

## 2014-05-21 LAB — MDC_IDC_ENUM_SESS_TYPE_REMOTE
Battery Voltage: 3.05 V
Brady Statistic AP VP Percent: 1.11 %
Brady Statistic AP VS Percent: 0.7 %
Brady Statistic AS VP Percent: 25.75 %
Brady Statistic RA Percent Paced: 1.81 %
HighPow Impedance: 456 Ohm
HighPow Impedance: 66 Ohm
Lead Channel Pacing Threshold Amplitude: 0.5 V
Lead Channel Pacing Threshold Pulse Width: 0.4 ms
Lead Channel Sensing Intrinsic Amplitude: 0.875 mV
Lead Channel Sensing Intrinsic Amplitude: 7.75 mV
Lead Channel Sensing Intrinsic Amplitude: 7.75 mV
Lead Channel Setting Pacing Amplitude: 2 V
Lead Channel Setting Pacing Amplitude: 2.75 V
Lead Channel Setting Pacing Pulse Width: 0.4 ms
Lead Channel Setting Sensing Sensitivity: 0.3 mV
MDC IDC MSMT LEADCHNL RA IMPEDANCE VALUE: 475 Ohm
MDC IDC MSMT LEADCHNL RA PACING THRESHOLD AMPLITUDE: 1.25 V
MDC IDC MSMT LEADCHNL RA PACING THRESHOLD PULSEWIDTH: 0.4 ms
MDC IDC MSMT LEADCHNL RA SENSING INTR AMPL: 0.875 mV
MDC IDC MSMT LEADCHNL RV IMPEDANCE VALUE: 608 Ohm
MDC IDC SESS DTM: 20160113144958
MDC IDC SET ZONE DETECTION INTERVAL: 250 ms
MDC IDC STAT BRADY AS VS PERCENT: 72.44 %
MDC IDC STAT BRADY RV PERCENT PACED: 26.86 %
Zone Setting Detection Interval: 300 ms
Zone Setting Detection Interval: 350 ms
Zone Setting Detection Interval: 360 ms

## 2014-05-21 NOTE — Progress Notes (Signed)
ICD remote received 

## 2014-06-04 ENCOUNTER — Other Ambulatory Visit: Payer: Self-pay | Admitting: Internal Medicine

## 2014-06-04 NOTE — Telephone Encounter (Signed)
Per Dr Aquilla Hacker 10.13.15 visit Coreg was d/c'd however there was not a d/c reason.  Last OV with you 6.3.15. Not sure if you prescribed Coreg or not.  Please advise

## 2014-06-05 NOTE — Telephone Encounter (Signed)
Do not fill.  Patient should check with cardiology

## 2014-07-08 ENCOUNTER — Telehealth: Payer: Self-pay

## 2014-07-08 NOTE — Telephone Encounter (Signed)
Informed patient that I will ask Chris Price and let him know

## 2014-07-08 NOTE — Telephone Encounter (Signed)
Pt wife called states they are going on a 10 day cruise and would like to know if he should take any precautions due to his device? Please call.

## 2014-07-15 NOTE — Telephone Encounter (Signed)
LMTCB/SSS 

## 2014-07-17 ENCOUNTER — Encounter: Payer: Self-pay | Admitting: Internal Medicine

## 2014-07-17 ENCOUNTER — Encounter: Payer: Self-pay | Admitting: *Deleted

## 2014-08-20 ENCOUNTER — Ambulatory Visit (INDEPENDENT_AMBULATORY_CARE_PROVIDER_SITE_OTHER): Payer: Medicare PPO | Admitting: *Deleted

## 2014-08-20 ENCOUNTER — Encounter: Payer: Self-pay | Admitting: Internal Medicine

## 2014-08-20 DIAGNOSIS — I429 Cardiomyopathy, unspecified: Secondary | ICD-10-CM | POA: Diagnosis not present

## 2014-08-20 DIAGNOSIS — I5022 Chronic systolic (congestive) heart failure: Secondary | ICD-10-CM | POA: Diagnosis not present

## 2014-08-21 NOTE — Progress Notes (Signed)
Remote ICD transmission.   

## 2014-08-22 ENCOUNTER — Telehealth: Payer: Self-pay | Admitting: Internal Medicine

## 2014-08-22 ENCOUNTER — Telehealth: Payer: Self-pay | Admitting: *Deleted

## 2014-08-22 NOTE — Telephone Encounter (Signed)
The order form is blank. I'd like to help but here are the barries:   1) I do not know what he needs .from the list of supplies which is Part 3.    The copy they included  is of the original order for autopap,  Not for supplies. Should I check everything?  2) There is no copy of sleep report in chart , which must be included with request for supplies.   3)   And the last time his compliance was documented with a computerized printout of use was July 2014.

## 2014-08-22 NOTE — Telephone Encounter (Signed)
The patient's wife stated that her husband needed orders for CPAP equipment sent to [facility].  I don't see a sleep study for the patient in his chart .  I have called [facility] to fax over sleep study. His wife will come by the office to bring the form of all that Chris Price is requesting before sending out his supplies.

## 2014-08-22 NOTE — Telephone Encounter (Signed)
Pt wife calling stating they Need a copy of sleep study faxed to [facility] needs this [phone] For his home health care.   Pt is coming Monday but they want need a script sent over along with a letter saying patient needs this.   This is what needs to be send for. quattro air medium s9 AutoSet H5I cleanable water tub S9 water filter and climate line tubing.

## 2014-08-22 NOTE — Telephone Encounter (Signed)
Patient needs supplies not equipment . I have placed form for [employer] in red folder on back of attached sheet you will see list of supplies needed, is all they need an order or does he need OV so we can document first . Last ordered by Dr. Lequita Halt but his office no longer will handle.

## 2014-08-22 NOTE — Telephone Encounter (Signed)
Spoke w/ pt's wife.  Advised her to contact pt's PCP or pulmonology/sleep specialist for this rx, as our office is cardiology.  She states that pt has had his CPAP for years and she does not remember which doctor ordered this.  She states that home health suggested she call us. She states that she will call PCP and home health, as some of the info is incorrect.

## 2014-08-25 ENCOUNTER — Ambulatory Visit (INDEPENDENT_AMBULATORY_CARE_PROVIDER_SITE_OTHER): Payer: Medicare PPO | Admitting: Cardiovascular Disease

## 2014-08-25 ENCOUNTER — Encounter: Payer: Self-pay | Admitting: *Deleted

## 2014-08-25 ENCOUNTER — Encounter: Payer: Self-pay | Admitting: Cardiovascular Disease

## 2014-08-25 VITALS — BP 121/70 | HR 72 | Ht 66.5 in | Wt 217.8 lb

## 2014-08-25 DIAGNOSIS — Z9989 Dependence on other enabling machines and devices: Secondary | ICD-10-CM

## 2014-08-25 DIAGNOSIS — I482 Chronic atrial fibrillation, unspecified: Secondary | ICD-10-CM

## 2014-08-25 DIAGNOSIS — I2510 Atherosclerotic heart disease of native coronary artery without angina pectoris: Secondary | ICD-10-CM | POA: Diagnosis not present

## 2014-08-25 DIAGNOSIS — I5022 Chronic systolic (congestive) heart failure: Secondary | ICD-10-CM

## 2014-08-25 DIAGNOSIS — I10 Essential (primary) hypertension: Secondary | ICD-10-CM

## 2014-08-25 DIAGNOSIS — I480 Paroxysmal atrial fibrillation: Secondary | ICD-10-CM

## 2014-08-25 DIAGNOSIS — G4733 Obstructive sleep apnea (adult) (pediatric): Secondary | ICD-10-CM

## 2014-08-25 MED ORDER — OMEPRAZOLE 20 MG PO CPDR: 20.0000 mg | DELAYED_RELEASE_CAPSULE | Freq: Two times a day (BID) | ORAL | Status: DC

## 2014-08-25 NOTE — Telephone Encounter (Signed)
Patient last sleep study was done at [facility] will send for record the list of supplies were labeled on the back of sheet attached to order. Patient is seeing dr.Klien today will see if his office will handle first before scheduling appointment.

## 2014-08-25 NOTE — Progress Notes (Signed)
Primary care physician: Dr. Derrel Nip  HPI  [name] is a pleasant 74 year old male who is here today for a followup visit. He has known history of chronic systolic heart failure due to nonischemic cardiomyopathy, chronic atrial fibrillation and moderate nonobstructive coronary artery disease.  He is status post dual-chamber ICD placement by Dr. Caryl Comes.  He was hospitalized at [facility] in 07/2013 after he presented with chest pain radiating to his back associated with diaphoresis and tachycardia. He was in atrial fibrillation with rapid ventricular response. He underwent cardiac catheterization which showed no significant change in coronary artery disease with no evidence of obstructive lesions. EF was 35%. The dose of carvedilol was increased in order to control her ventricular rate. Lisinopril was decreased. His symptoms were felt to be GI in nature. He continued to have symptoms and was found to have chololithiasis. He underwent cholecystectomy without complications. He has been doing well. No chest pain or dyspnea.   He has sleep apnea and uses CPAP with good compliance. He went on a cruise recently and developed diarrhea which seems to be improving.  No Known Allergies   Current Outpatient Prescriptions on File Prior to Visit  Medication Sig Dispense Refill  . atorvastatin (LIPITOR) 80 MG tablet TAKE 1 TABLET EVERY DAY 90 tablet 3  . carvedilol (COREG) 12.5 MG tablet Take 12.5 mg by mouth 2 (two) times daily with a meal.    . finasteride (PROSCAR) 5 MG tablet Take 5 mg by mouth daily.    . furosemide (LASIX) 20 MG tablet TAKE 1 TABLET EVERY DAY 90 tablet 3  . lisinopril (PRINIVIL,ZESTRIL) 10 MG tablet Take 1 tablet (10 mg total) by mouth daily. 90 tablet 3  . nitroGLYCERIN (NITROSTAT) 0.4 MG SL tablet Place 0.4 mg under the tongue every 5 (five) minutes as needed for chest pain.    Marland Kitchen omeprazole (PRILOSEC) 20 MG capsule Take 20 mg by mouth 2 (two) times daily before a meal. Acid reflux.    .  rivaroxaban (XARELTO) 20 MG TABS tablet Take 1 tablet (20 mg total) by mouth daily with supper. 90 tablet 3  . spironolactone (ALDACTONE) 25 MG tablet Take 0.5 tablets (12.5 mg total) by mouth daily. 135 tablet 3  . Tamsulosin HCl (FLOMAX) 0.4 MG CAPS Take 0.4 mg by mouth daily.     No current facility-administered medications on file prior to visit.     Past Medical History  Diagnosis Date  . Lumbar spinal stenosis   . Degenerative disk disease     lumbar  . Disc herniation   . Hyperlipidemia   . Cardiomyopathy -nonischemic     moderate to severe EF 25-30% March 2013  . Chronic systolic heart failure 7/37/1062  . ICD (implantable cardiac defibrillator) in place   . CHF (congestive heart failure)   . Sleep apnea     uses cpap  . GERD (gastroesophageal reflux disease)   . Neuromuscular disorder     neauropathy   / siatica  . Hypertension   . Atrial fibrillation   . IVCD (intraventricular conduction defect)     Nonspecific QRS duration 122  . Persistent atrial fibrillation 11/07/2011  . Coronary artery disease 08/15/2011    Moderate nonobstructive CAD with 60% mid LAD, 60% proximal RCA and 30 % stenossis left circumflex. Cardiac catheterization in March of 2015 showed no significant change      Past Surgical History  Procedure Laterality Date  . Lumbar laminectomy      L2 through  S1 with wide decompression of the thecal sac and nerve roots.  . Cardioversion  08/2011    [facility]  . Hernia repair    . Icd  09/08/2011  . Back surgery    . Cooled thermotherapy  7425    [place], complicated by incontinence  . Prostate biopsy  2013  . Cardiac catheterization  08/15/2011    [facility]   . Cardiac catheterization  07/09/2013    [facility]  . Cholecystectomy  April 2015  . Implantable cardioverter defibrillator implant  09/08/2011    Procedure: IMPLANTABLE CARDIOVERTER DEFIBRILLATOR IMPLANT;  Surgeon: [name], MD;  Location: [facility] CATH LAB;  Service: Cardiovascular;;     Family History  Problem  Relation Age of Onset  . Heart attack Father   . Heart attack Brother      History   Social History  . Marital Status: Married    Spouse Name: N/A  . Number of Children: N/A  . Years of Education: N/A   Occupational History  . Not on file.   Social History Main Topics  . Smoking status: Never Smoker   . Smokeless tobacco: Never Used  . Alcohol Use: No  . Drug Use: No  . Sexual Activity: Not Currently   Other Topics Concern  . Not on file   Social History Narrative     PHYSICAL EXAM   BP 121/70 mmHg  Pulse 72  Ht 5' 6.5" (1.689 m)  Wt 217 lb 12 oz (98.771 kg)  BMI 34.62 kg/m2 Constitutional: He is oriented to person, place, and time. He appears well-developed and well-nourished. No distress.  HENT: No nasal discharge.  Head: Normocephalic and atraumatic.  Eyes: Pupils are equal and round. Right eye exhibits no discharge. Left eye exhibits no discharge.  Neck: Normal range of motion. Neck supple. No JVD present. No thyromegaly present.  Cardiovascular: Normal rate, irregular rhythm, normal heart sounds. Exam reveals no gallop and no friction rub. No murmur heard.  Pulmonary/Chest: Effort normal and breath sounds normal. No stridor. No respiratory distress. He has no wheezes. He has no rales. He exhibits no tenderness.  Abdominal: Soft. Bowel sounds are normal. He exhibits no distension. There is no tenderness. There is no rebound and no guarding.  Musculoskeletal: Normal range of motion. He exhibits no edema and no tenderness.  Neurological: He is alert and oriented to person, place, and time. Coordination normal.  Skin: Skin is warm and dry. No rash noted. He is not diaphoretic. No erythema. No pallor.  Psychiatric: He has a normal mood and affect. His behavior is normal. Judgment and thought content normal.       EKG: Atrial fibrillation  - -Intraventricular conduction defect and left axis -possible anterior fascicular block   consider ventricular hypertrophy.    -  Nonspecific T-abnormality.   ABNORMAL   ASSESSMENT AND PLAN

## 2014-08-25 NOTE — Patient Instructions (Addendum)
Medication Instructions:  None  Labwork: None  Testing/Procedures: None  Follow-Up: Your physician wants you to follow-up in: 6 months with Dr. Fletcher Anon. You will receive a reminder letter in the mail two months in advance. If you don't receive a letter, please call our office to schedule the follow-up appointment.   Any Other Special Instructions Will Be Listed Below (If Applicable).  I will fax a letter over today to [facility] about your Sleep Apnea.

## 2014-08-26 NOTE — Op Note (Signed)
PATIENT NAME:  Chris Price, Chris Price MR#:  [id number] DATE OF BIRTH:  1940-08-26  DATE OF PROCEDURE:  01/24/2012  PREOPERATIVE DIAGNOSES:  1. Benign prostatic hypertrophy.  2. Urinary retention.   POSTOPERATIVE DIAGNOSES:  1. Benign prostatic hypertrophy.  2. Urinary retention.   PROCEDURE: Transurethral resection of the prostate.   SURGEON: Edrick Oh, M.D.   ANESTHESIA: Laryngeal mask airway anesthesia.   INDICATIONS: The patient is a 74 year old gentleman with a history of benign prostatic hypertrophy. He recently developed urinary retention. He has failed conservative medical measures. He presents for transurethral resection of the prostate.   DESCRIPTION OF PROCEDURE: After informed consent was obtained, the patient was taken to the Operating Room and placed in the dorsal lithotomy position under laryngeal mask airway anesthesia. The patient was then prepped and draped in the usual standard fashion. The saline resectoscope sheath was placed utilizing the visual obturator without difficulty. No significant urethral abnormalities were noted. Upon entering the prostatic fossa, prominent bilobar hypertrophy was visualized with complete visual obstruction noted. The bladder neck was noted to be significantly elevated. Upon entering the bladder, the mucosa was inspected in its entirety with no gross mucosal lesions noted, except for inflammatory changes on the posterior bladder wall and bladder base consistent with Foley catheterization. Bilateral ureteral orifices were identified. They were noted to be fairly close to the bladder neck due to elevation from intravesical protrusion of the prostate tissue. Prominent inflammatory changes were noted on the bladder neck consistent with Foley catheterization. The saline resectoscope was then placed through the visual obturator. Resection was begun at the level of the bladder neck. Care was taken to minimize resection near the ureteral orifices. It was taken  fairly close to the area but an adequate distance was maintained. Significant elevation of the bladder neck was appreciated. The verumontanum was noted to be approximately 1.5 cm below the level of the bladder neck. Prominent bilateral hypertrophy was also visualized. This was taken down utilizing the resectoscope. Additional anterior tissue was also identified. This was also taken down. Additional lateral lobe tissue was then resected to the level of the verumontanum. Several areas of bleeding were encountered. These were cauterized utilizing electrocautery. Once the bulk of the tissue was resected to the level of the verumontanum, the button electrode was then utilized. Additional base and lateral tissue was taken down utilizing the button electrode. Additional areas of cauterization was undertaken. Visualization of the ureteral orifices was once again undertaken. It was still noted to be an adequate distance from the area of resection, however, due to the close proximity additional bladder neck incision or resection was not undertaken due to concerns over the ureteral orifices. The prostate chips were irrigated free from the prostate. These were collected and will be sent for pathology analysis. Several other areas of bleeding were then identified. These were also cauterized utilizing the Bugbee electrode. After adequate hemostasis was obtained, the resectoscope sheath was removed. A 22 French three-way Foley catheter was then placed utilizing a catheter guide into the urinary bladder without difficulty. This was placed to gravity drainage. It was also placed to continuous bladder irrigation. The patient was returned to the supine position. He was awakened from laryngeal mask airway anesthesia. He was taken to the recovery room in stable condition. There were no problems or complications. The patient tolerated the procedure well. ____________________________ Denice Bors. Jacqlyn Larsen, MD bsc:slb D: 01/24/2012 22:37:38  ET T: 01/25/2012 09:40:14 ET JOB#: [id number]  cc: Denice Bors. Jacqlyn Larsen, MD, <Dictator> Kelbi Renstrom S Berdine Rasmusson  MD ELECTRONICALLY SIGNED 01/26/2012 8:19

## 2014-08-27 NOTE — Telephone Encounter (Signed)
Called patient concerning CPAP supplies patient stated that Dr. Fletcher Anon office is trying to initiate new referral to [facility] for patient. Patient will contact office if further assistance needed.

## 2014-08-30 NOTE — Assessment & Plan Note (Signed)
Continue treatment with CPAP. He is currently using the machine regularly. He benefits from continued treatment given the symptomatic improvement and his cardiac comorbidities.

## 2014-08-30 NOTE — Assessment & Plan Note (Signed)
Ventricular rate is well controlled. He is tolerating anticoagulation with no reported side effects.

## 2014-08-30 NOTE — H&P (Signed)
PATIENT NAME:  Chris Price, Chris Price MR#:  [id number] DATE OF BIRTH:  Mar 04, 1941  DATE OF ADMISSION:  07/08/2013  REFERRING PHYSICIAN: Dr. Lurline Hare.   PRIMARY CARE PHYSICIAN: Dr. Deborra Medina.  PRIMARY CARDIOLOGIST: Dr. Kathlyn Sacramento.   CHIEF COMPLAINT: Chest pain, palpitations.   HISTORY OF PRESENT ILLNESS: This is a 74 year old male with significant past medical history of coronary artery disease, nonischemic cardiomyopathy, EF 25% to 30% in 2297, chronic systolic heart failure, status post AICD, sleep apnea on CPAP, chronic atrial fibrillation on anticoagulation with Pradaxa, presents with complaints of chest pain. The patient reports chest pain developed this evening while he was at rest. Reports it as midsternal, pressure-like quality, nonradiating, accompanied by nausea, sweating and shortness of breath. Reports a previous episode happened last Wednesday. The patient was given 324 mg of aspirin in the ED. Upon presentation, he reports already his chest pain has been resolved. The patient's first troponin was negative. His EKG does not show any changes from previous when compared to EKG from [facility] records in September 2014. As well, the patient was noticed to be in Afib with RVR, heart rate was 162 upon presentation. The patient received IV Cardizem x 2 in the ED with significant improvement of his heart rate, which currently is in the low 100s.  The patient's blood work was significant for elevated BNP at 1038. The patient reports he is measuring his weight on a daily basis, as he was instructed by his cardiologist, and if there is an increase to follow with cardiology. Reports his weight has been stable. Denies any leg edema or any orthopnea or any paroxysmal nocturnal dyspnea. As well, the patient's labs were significant for hyperkalemia at 6.1 but it is hemolyzed as well.   PAST MEDICAL HISTORY: 1.  Hyperlipidemia.  2.  Nonischemic cardiomyopathy. EF of 25% to 30%.  3.  Chronic systolic  heart failure.  4.  AICD placement.  5.  Congestive heart failure.  6.  Sleep apnea on CPAP.  7.  GERD. 8.  Coronary artery disease with moderate nonobstructive coronary artery disease of 60% in the mid LAD and 60% proximal RCA and 30% stenosis in left circumflex in April 2013.  8.  Persistent atrial fibrillation.  9.  Hypertension.  10.  Intraventricular conduction defect.   HOME MEDICATIONS:   1.  Atorvastatin 80 mg daily.  2.  Coreg 25 mg 2 times a day.  3.  Coenzyme Q10, 1 tablet oral daily.  4.  Pradaxa 150 mg oral 2 times a day.  5.  Proscar 5 mg oral daily.  6.  Lasix 20 mg oral daily.  7.  Lisinopril 20 mg daily.  8.  Omeprazole 20 mg daily.  9.  Primidone 50 mg oral at bedtime.  10.  Aldactone 12.5 mg daily.  11.  Tamsulosin 0.4 mg daily.  12.  Tramadol 50 mg as needed.   ALLERGIES: No known drug allergies.   PAST SURGICAL HISTORY:  Shoulder surgery and back surgery and AICD placement.  SOCIAL HISTORY: No smoking. No alcohol. No illicit drug use.  FAMILY HISTORY:  Denies any family history of heart disease at young age.   REVIEW OF SYSTEMS:  CONSTITUTIONAL:  Denies fever, chills, fatigue, weakness, weight gain or weight loss.  EYES: Denies blurry vision, double vision, inflammation, glaucoma.  ENT: Denies tinnitus, ear pain, hearing loss, epistaxis or discharge.  RESPIRATORY: Denies cough, wheezing, hemoptysis, dyspnea, COPD.  CARDIOVASCULAR: Reports chest pain, currently resolved. Reports palpitation. Denies, syncope  or edema.  GASTROINTESTINAL:  Reports mild nausea. Denies any vomiting, diarrhea, abdominal pain or constipation. GENITOURINARY: Denies dysuria, hematuria or renal colic.  ENDOCRINE: Denies polyuria, polydipsia, heat or cold intolerance.  HEMATOLOGY: Denies anemia, easy bruising, bleeding diathesis.  INTEGUMENT: Denies acne, rash or skin lesions.  MUSCULOSKELETAL: Denies any neck pain, back pain, gout or cramps.  NEUROLOGIC: Denies CVA, TIA, ataxia,  vertigo, tremor.  PSYCHIATRIC: Denies anxiety, insomnia or bipolar disorder.   PHYSICAL EXAMINATION: VITAL SIGNS: Temperature 97.6, pulse 110, respiratory rate 18, blood pressure 117/64, saturating 94% on oxygen.  GENERAL: Well-nourished male who looks comfortable in no apparent distress.  HEENT: Head atraumatic, normocephalic. Pupils are equal and reactive to light. Pink conjunctivae. Anicteric sclerae. Moist oral mucosa.  NECK: Supple. No thyromegaly. No JVD.  CHEST: Good air entry bilaterally. No wheezing, rales, rhonchi.  CARDIOVASCULAR: S1, S2 heard. No rubs, murmurs, gallops. Irregularly irregular.    ABDOMEN: Soft, nontender, nondistended. Bowel sounds present.  EXTREMITIES: No edema. No clubbing. No cyanosis. Pedal pulses +2bilaterally.  Radial pulses +2 bilaterally.  PSYCHIATRIC: Appropriate affect. Awake, alert x 3. Intact judgment and insight.  NEUROLOGIC: Cranial nerves grossly intact. Motor 5/5. No focal deficits.  SKIN: Normal skin turgor. Warm and dry.  LYMPHATICS: No cervical lymphadenopathy.  MUSCULOSKELETAL: No joint effusion or erythema.   PERTINENT LABORATORY DATA: Glucose 95. BNP 1038. BUN 25, creatinine 0.96, sodium 141, potassium 6.1 (hemolyzed), chloride 105, CO2 of 31. Anion gap 5. Troponin less than 0.02. White blood cells 11.4, hemoglobin 16.3, hematocrit 51.1, platelets 207.  INR 1.2.   EKG: Showing atrial fibrillation at 138 beats per minute with right bundle branch block.  ASSESSMENT AND PLAN: 1.  Chest pain, currently appears to be resolved. The patient will be admitted to telemetry floor, already received 324 mg of aspirin. He is already on beta blockers and statin. We will cycle his cardiac enzymes. We will follow the trend and we will consult cardiology.  2.  Atrial fib with rapid ventricular response. The patient had transient improvement with IV Cardizem. We will resume him back on his Coreg and we will manage with p.r.n. Cardizem pushes in between.  3.   Hyperkalemia. Appears to be hemolyzed. We will repeat level with next blood work.  4.  Hyperlipidemia. Continue with statin.  5.  Known history of congestive heart failure with cardiomyopathy, nonischemic. The patient appears to be compensated currently. Does not appear to be in CHF exacerbation. We will continue patient's medications including Aldactone, lisinopril and beta blockers. We will resume him back on Lasix when he is more stable.  6.  Obstructive sleep apnea. Continue with CPAP.  7.  Gastroesophageal reflux disease. Continue with PPI.  8.  Known history of coronary artery disease. The patient is on full anticoagulation with Pradaxa on beta blockers, on lisinopril and on statin.  9.  Hypertension. Blood pressure acceptable. Continue with home meds.  10.  Deep vein thrombosis prophylaxis. The patient is on  anticoagulation with Pradaxa.    CODE STATUS: Discussed with the patient. He does not have a LIVING WILL. Has no health care power of attorney, and reports he is a FULL CODE.   TOTAL TIME SPENT FOR ADMISSION AND PATIENT CARE: 60 minutes.    ____________________________ Albertine Patricia, MD dse:dmm D: 07/08/2013 04:47:51 ET T: 07/08/2013 11:51:48 ET JOB#: [id number]  cc: Albertine Patricia, MD, <Dictator> Jemima Petko Graciela Husbands MD ELECTRONICALLY SIGNED 07/09/2013 0:46

## 2014-08-30 NOTE — Op Note (Signed)
PATIENT NAME:  Chris Price, Chris Price MR#:  [id number] DATE OF BIRTH:  [birth year]  DATE OF PROCEDURE:  08/08/2013  PREOPERATIVE DIAGNOSIS: Symptomatic cholelithiasis.   POSTOPERATIVE DIAGNOSIS: Symptomatic cholelithiasis.  PROCEDURE PERFORMED: Multi-port robotically assisted laparoscopic cholecystectomy.   SURGEON: Sherri Rad, M.D.   ASSISTANT: None.   ANESTHESIA: General endotracheal.   FINDINGS: Stones and gallbladder was mildly thickened and inflamed.   SPECIMENS: Gallbladder with contents.   ESTIMATED BLOOD LOSS: 100 mL.   DRAINS: None.   LAP AND NEEDLE COUNT: Correct x 2.   DESCRIPTION OF PROCEDURE: With informed consent, supine position and general endotracheal anesthesia, the patient's abdomen was sterilely prepped and draped with ChloraPrep solution. Timeout was observed.   Attempt was first made at a single site approach. A curvilinear incision was fashioned beneath the umbilicus. Fascial incision measuring approximately 2 cm was fashioned in the midline. A Davinci single site port was then placed and positioned appropriately to target anatomy. Pneumoperitoneum was established. Camera trocar was inserted. General exploration of the abdomen demonstrated a large amount of omentum. A 5 mm assistant port was then placed through the trocar. Bariatric grasper was used to investigate the right upper quadrant. The gallbladder appeared to be mobile. I then proceeded with single site instrumentation placement. The robot was docked. I then moved to the console.   Despite multiple attempts at repositioning the camera port and with the patient being in steep reverse Trendelenburg and airplane right side up, I could not obtain sufficient triangulation of tissues because of the barrel chested nature of the patient. At this point, I then returned to the operating room table. Single site instrumentation was removed. The port was left in place. Additional trocars was placed for standard multiport  access. The patient cart was then re-docked. Instruments were then placed under direct visualization. I returned to the console.   Multiport laparoscopic cholecystectomy became feasible. The hepatoduodenal ligament was then incised with hook electrocautery. The cystic duct was identified and locking hemoclips were placed on either side. Cystic artery was likewise identified with critical view of safety. The cystic artery was ligated with medium hemoclips and divided. Small lymphatic was also divided with hemoclips. The gallbladder was then removed tediously off the gallbladder fossa with hook cautery apparatus. It was placed into an Endo Catch device by inserting it through the multiport trocar site. Hemostasis was then ensured on the operative field and the gallbladder fossa with additional point cautery application. I then returned to the table. The patient cart was undocked. The specimen was retrieved. The single port apparatus was then reinserted. Pneumoperitoneum was re-established. Irrigation of the right upper quadrant with 2 L of normal saline at this point was performed and aspirated dry. Additional hemostasis was obtained with 10 mL of Surgiflo with thrombin application as well as the application of 2 pieces of Surgicel.   Additionally, gallbladder fossa was packed with a Ray-Tec sponge and pressure was held for approximately 5 minutes. Ray-Tec sponge was then removed. Hemostasis appeared to be adequate. The patient was then returned supine. Ports were then removed under direct visualization. The infraumbilical fascial defect was reapproximated with multiple interrupted simple and vertical mattress #0 Vicryl sutures in vertical orientation. Skin edges were reapproximated utilizing a 4-0 Vicryl and a total of 20 mL of 0.25% plain Marcaine was infiltrated for postoperative anesthesia affect. Benzoin, Steri-Strips, Telfa and Tegaderm were then applied and the patient was subsequently extubated and taken  to the recovery room in stable and satisfactory condition by anesthesia services.  ____________________________ Jeannette How Marina Gravel, MD mab:aw D: 08/09/2013 08:08:09 ET T: 08/09/2013 08:20:12 ET JOB#: [id number]  cc: Elta Guadeloupe A. Marina Gravel, MD, <Dictator> Muhammad A. Fletcher Anon, MD Hortencia Conradi MD ELECTRONICALLY SIGNED 08/13/2013 13:13

## 2014-08-30 NOTE — Assessment & Plan Note (Addendum)
He is currently New [place] Heart Association class 2 appears to be euvolemic. He is on optimal medical therapy.

## 2014-08-30 NOTE — Assessment & Plan Note (Signed)
Blood pressure is well controlled on current medications. 

## 2014-08-30 NOTE — Consult Note (Signed)
General Aspect Chris Price is a 74 yo Caucasian male w/ PMHx s/f CAD (nonobstructive by 08/2011 cath), nonischemic cardiomyopathy (EF 25-30%) s/p Medtronic dual-chamber ICD, persistent atrial fibrillation (on Pradaxa), HTN, HLD, obesity and OSA (on CPAP) who was admitted to [facility] today w/ chest pain.   He has a history of a normal stress test "years ago." He had carried a prior diagnosis of CHF. He has undergone DCCV x 1 for a-fib. He underwent cardiac cath in 08/2011 in the setting of decompensated CHF, further reduced EF (25% at that time). This revealed 60% mid LAD, 50% ostial D1, 70% mid D1, 30% mid LCx, 30% mid OM1, 60% prox RCA, 50% distal RCA x 2. He was treated medically, diagnosed w/ NICM (? tachy-mediated) and shortly after underwent dual-chamber ICD implantation. He has been maintained on Pradaxa, lisinopril, carvedilol, spironolactone, lasix 20, atorvastatin. NYHA class II symptoms at baseline. Had been in a-fib 52% of the time on device interrogation in 01/2013. Amiodarone was stopped due to tremor, rate-control achieved w/ carvedilol. Rate- over rhythm-control strategy was elected given minimal symptoms while in a-fib.  He reports experiencing progressive fatigue and DOE over the past 1-2 months (NYHA class II-III symptoms). Notes occasional dizziness on bending over. No weight increase, LEE, PND, orthopnea. Around 10 PM Wednesday, he developed sudden onset of diaphoresis, followed by substernal pressure rated at a 8/10 w/ associated dyspnea, radiating to his back while seated on the couch. Thinking this was indigestion, he took antacids w/ minimal effect. The pain eventually subsided after 3-4 hours. He did not seek medical attention due to the snow. He had been shoveling earlier that day w/o incident. The pain went away completely until Sunday, when he had another episode at rest, only more severe at a 10/10. It lasted all night and into this AM. He subsequently presented to the ED for further  evaluation.   Present Illness There, EKG revealed a-fib w/ RVR, HR 138 bpm. Flat 1 mm ST depressions + TWIs are appreciated in I, aVL. Initial TnI returned WNL. BNP 1038. TSH WNL. CBC- WBC 11.4K, otherwise WNL. CXR- new mildly calcified aortic knob, mild R hemidiaphragm elevation, R linear atelectasis. He received a full-dose ASA, followed by Cardizem bolus x 2. HR currently 80-low 100s. No further chest discomfort. A subsequent TnI has returned WNL.   PAST MEDICAL HISTORY Nonobstructive CAD Persistent atrial fibrillation Nonischemic cardiomyopathy, EF 25-30% Obesity HTN HLD OSA on CPAP  PAST SURGICAL HISTORY Hernia repair Herniated disk repair Cardiac catheterization  ALLERGIES No known drug allergies  SOCIAL HISTORY Lives in [place], [place] w/ wife. Two grown, healthy children. Denies tobacco, EtOH or illicit drug use.  FAMILY HISTORY Brother w/ massive MI at 39. Brother in his 80s w/ multiple CVAs.   Physical Exam:  GEN no acute distress, obese   HEENT pink conjunctivae, PERRL, hearing intact to voice   NECK supple  No masses  trachea midline  no JVD or bruits   RESP normal resp effort  clear BS  no use of accessory muscles  no wheezing, rales or rhonchi   CARD Normal, S1, S2  irregularly irregular, normal rate, no murmurs or gallops   ABD denies tenderness  soft   EXTR negative cyanosis/clubbing, negative edema, +2 radial, femoral, DP/PT pulses   SKIN normal to palpation   NEURO follows commands, motor/sensory function intact   PSYCH alert, A+O to time, place, person   Review of Systems:  Subjective/Chief Complaint chest pain   General: Fatigue  Respiratory: Short of breath   Cardiovascular: Chest pain or discomfort  Dyspnea   Musculoskeletal: Muscle or joint pain   Neurologic: Dizzness   Review of Systems: All other systems were reviewed and found to be negative   Home Medications: Medication Instructions Status  atorvastatin 80 mg oral tablet 1  tab(s) orally once a day (at bedtime) Active  carvedilol 25 mg oral tablet 1 tab(s) orally 2 times a day Active  Coenzyme Q10 10 mg oral capsule 10  orally once a day Active  dabigatran 150 mg oral capsule 1 cap(s) orally 2 times a day Active  finasteride 5 mg oral tablet 1 tab(s) orally once a day Active  furosemide 20 mg oral tablet 1 tab(s) orally once a day Active  lisinopril 10 mg oral tablet 1 tab(s) orally once a day Active  omeprazole 20 mg oral delayed release capsule 1 cap(s) orally once a day Active  spironolactone 25 mg oral tablet 12.5  orally once a day Active  tamsulosin 0.4 mg oral capsule 1 cap(s) orally once a day Active  traMADol 50 mg oral tablet  orally , As Needed - for Pain Active  primidone 50 mg oral tablet  orally once a day (at bedtime) i Active   Lab Results:  Thyroid:  02-Mar-15 02:03   Thyroid Stimulating Hormone 1.33 (0.45-4.50 (International Unit)  ----------------------- Pregnant patients have  different reference  ranges for TSH:  - - - - - - - - - -  Pregnant, first trimetser:  0.36 - 2.50 uIU/mL)  Routine Chem:  02-Mar-15 02:03   Potassium, Serum  6.1  Glucose, Serum 95  BUN  25  Creatinine (comp) 0.96  Sodium, Serum 141  Chloride, Serum 105  CO2, Serum 31  Calcium (Total), Serum 9.0  Anion Gap  5  Osmolality (calc) 285  eGFR (African American) >60  eGFR (Non-African American) >60 (eGFR values <7m/min/1.73 m2 may be an indication of chronic kidney disease (CKD). Calculated eGFR is useful in patients with stable renal function. The eGFR calculation will not be reliable in acutely ill patients when serum creatinine is changing rapidly. It is not useful in  patients on dialysis. The eGFR calculation may not be applicable to patients at the low and high extremes of body sizes, pregnant women, and vegetarians.)  Result Comment POTASSIUM/CK TOTAL - Slight hemolysis, interpret results with  - caution.  Result(s) reported on 08 Jul 2013 at  03:13AM.  B-Type Natriuretic Peptide ([facility]  1038 (Result(s) reported on 08 Jul 2013 at 03:13AM.)    04:51   Potassium, Serum 4.2 (Result(s) reported on 08 Jul 2013 at 05:38AM.)  Cardiac:  02-Mar-15 02:03   Troponin I < 0.02 (0.00-0.05 0.05 ng/mL or less: NEGATIVE  Repeat testing in 3-6 hrs  if clinically indicated. >0.05 ng/mL: POTENTIAL  MYOCARDIAL INJURY. Repeat  testing in 3-6 hrs if  clinically indicated. NOTE: An increase or decrease  of 30% or more on serial  testing suggests a  clinically important change)  CPK-MB, Serum 2.5 (Result(s) reported on 08 Jul 2013 at 03:14AM.)  CK, Total  340 (39-308 NOTE: NEW REFERENCE RANGE  06/10/2013)    06:42   Troponin I < 0.02 (0.00-0.05 0.05 ng/mL or less: NEGATIVE  Repeat testing in 3-6 hrs  if clinically indicated. >0.05 ng/mL: POTENTIAL  MYOCARDIAL INJURY. Repeat  testing in 3-6 hrs if  clinically indicated. NOTE: An increase or decrease  of 30% or more on serial  testing suggests a  clinically important change)  CPK-MB, Serum 1.9 (Result(s) reported on 08 Jul 2013 at 07:11AM.)  Routine Coag:  02-Mar-15 02:03   Prothrombin  14.9  INR 1.2 (INR reference interval applies to patients on anticoagulant therapy. A single INR therapeutic range for coumarins is not optimal for all indications; however, the suggested range for most indications is 2.0 - 3.0. Exceptions to the INR Reference Range may include: Prosthetic heart valves, acute myocardial infarction, prevention of myocardial infarction, and combinations of aspirin and anticoagulant. The need for a higher or lower target INR must be assessed individually. Reference: The Pharmacology and Management of the Vitamin K  antagonists: the seventh ACCP Conference on Antithrombotic and Thrombolytic Therapy. JMEQA.8341 Sept:126 (3suppl): [id number]. A HCT value >55% may artifactually increase the PT.  In one study,  the increase was an average of 25%. Reference:  "Effect on  Routine and Special Coagulation Testing Values of Citrate Anticoagulant Adjustment in Patients with High HCT Values." American Journal of Clinical Pathology 2006;126:400-405.)  Routine Hem:  02-Mar-15 02:03   WBC (CBC)  11.4  RBC (CBC) 5.24  Hemoglobin (CBC) 16.3  Hematocrit (CBC) 51.1  Platelet Count (CBC) 207 (Result(s) reported on 08 Jul 2013 at 02:26AM.)  MCV 98  MCH 31.2  MCHC 32.0  RDW  14.6   EKG:  Interpretation atrial fibrillation w/ RVR, LAD, 64m flat ST depressions I, aVL w/ associated TWIs   Rate 138   EKG Comparision Changed from  01/2013 tracing   Radiology Results: XRay:    02-Mar-15 02:20, Chest Portable Single View  Chest Portable Single View   REASON FOR EXAM:    chest pain radiating to back  COMMENTS:       PROCEDURE: DXR - DXR PORTABLE CHEST SINGLE VIEW  - Jul 08 2013  2:20AM     CLINICAL DATA:  Chest pain radiating to back for 1 week.    EXAM:  PORTABLE CHEST - 1 VIEW    COMPARISON:  DGCHEST 2V dated 08/29/2011    FINDINGS:  Cardiomediastinal silhouette is nonsuspicious for this low  inspiratory portable examination with crowded vasculature markings.  New mildly calcified aortic knob. Dual lead left cardiac  defibrillator in situ. Linear density in right lung base. No pleural  effusions or focal consolidations. Mildly elevated right  hemidiaphragm . No pneumothorax.    Severe degenerative change of the right acromioclavicular joint with  offset which may reflect remote shoulder separation. Moderate  degenerative change of the thoracic spine.     IMPRESSION:  Linear density in right lung base favors atelectasis or scar in this  low inspiratory portable examination.      Electronically Signed    By: CElon Alas   On: 07/08/2013 02:45         Verified By: CRicky Ala M.D.,    No Known Allergies:    Impression 1. Atrial fibrillation w/ RVR HR 140s on ED arrival. Improved w/ Cardizem bolus x 2. HR 80-low 100s presently.  Taken off amiodarone last year. RVR likely causing demand ischemia with borderline CAD. Remains on Pradaxa, last dose was last PM. Had missed 3 doses last weekend. Not a candidate for any form of cardioversion presently. Strategy will remain rate-control. Follow-up EP as OP for failed rate-control or ongoing symptoms to consider AAD. -- Hold Pradaxa in anticipation for cath tomorrow AM -- Start heparin this evening -- Give digoxin 0.251mq4h x 2, then 0.12569mtarting tomorrow for rate-control -- Continue carvedilol  2. Unstable angina The patient is  admitted after two episodes of substernal chest pressure w/ associated diaphoresis and dyspnea last week. Description is concerning for angina. Although the episodes lasted for hours, there was no elevation in troponin. A-fib w/ RVR may have caused demand ischemia. He has known borderline coronary lesions on 2013 cath. He has not had an ischemic work-up since. Plan will be to control HR and proceed w/ diagnostic cardiac cath tomorrow AM.  -- Monitor rhythm on telemetry -- Rate-control as outlined above -- Hold Pradaxa, start heparin this PM -- Check Hgb A1C, lipid panel -- Continue ASA, ACEi, BB, statin -- NTG SL PRN for chest pain -- Obtain one more troponin for formal rule out  3. Nonischemic cardiomyopathy EF 25-30% s/p Medtronic ICD Question tachy-mediated. Will need to control the rate to avoid further reduced EF. Mildly elevated BNP due to a-fib w/ RVR. Euvolemic on exam. Evaluate for significant flow-limiting ischemia w/ cath tomorrow. -- Monitor I/Os -- Continue ACEi, BB, spiro, Lasix -- Avoid aggressive hydration, check renal function tomorrow -- Check EF, wall motion, MV/AV on cath tomorrow  4. Hypertension Well-controlled.  -- Continue antihypertensives  5. Hyperlipidemia -- Check lipid panel -- Continue atorvastatin  6. OSA -- CPAP while inpatient   Electronic Signatures for Addendum Section:  Kathlyn Sacramento (MD) (Signed  Addendum 02-Mar-15 12:51)  The patient was seen and examined. Agree with the above. He presented with chest pain at rest and with activites which has been recurrent since Wed. He is mildly tachycardiac by exam due to A-fib with RVR. ECG with borderline ST depression in lateral leads.  Recommend: Add Digoxin for rate control.  Hold Pradaxa. Start Heparin drip tonight.  He has known history of CAD with borderline lesions noted on most recent cardiac cath in 2013. Recommend cardiac cath tomorrow.   Electronic Signatures: Meriel Pica (PA-C)  (Signed 02-Mar-15 12:27)  Authored: General Aspect/Present Illness, History and Physical Exam, Review of System, Home Medications, Labs, EKG , Radiology, Allergies, Impression/Plan Kathlyn Sacramento (MD)  (Signed 02-Mar-15 12:51)  Co-Signer: General Aspect/Present Illness, History and Physical Exam, Review of System, Home Medications, Labs, EKG , Radiology, Allergies, Impression/Plan   Last Updated: 02-Mar-15 12:51 by Kathlyn Sacramento (MD)

## 2014-08-30 NOTE — Discharge Summary (Signed)
PATIENT NAME:  Chris Price, Chris Price MR#:  [id number] DATE OF BIRTH:  1941-02-27  DATE OF ADMISSION:  07/08/2013 DATE OF DISCHARGE:  07/09/2013  For a detailed note, please see the history and physical done on admission by Dr. Landis Gandy.   DIAGNOSES AT DISCHARGE: 1. Chest pain, likely related to rapid atrial fibrillation with a ventricular response.  2. Atrial fibrillation with rapid ventricular response.  3. Hypertension.  4. Hyperlipidemia.  5. Gastroesophageal reflux disease.  The patient is being discharged on a low-sodium, low-fat diet.   ACTIVITY: As tolerated.   Follow-up with Dr. Derrel Nip in the next 1 to 2 weeks.   DISCHARGE MEDICATIONS:  Atorvastatin 80 mg at bedtime, Coreg 25 mg b.i.d., coenzyme Q 10 mg daily, Pradaxa 150 mg t.i.d., finasteride 5 mg daily, Lasix 20 mg daily, lisinopril 10 mg daily, Aldactone 25 mg 1/2 tab daily, Flomax 0.4 mg daily, tramadol 50 mg as needed, primidone 50 mg at bedtime, omeprazole 20 mg b.i.d., sublingual nitroglycerin q. five minutes as needed.   CONSULTANTS DURING THE HOSPITAL COURSE: Dr. Ida Rogue from cardiology.   PERTINENT STUDIES DONE DURING THE HOSPITAL COURSE: Are as follows: A chest x-ray done on admission showing linear density in the right lung base favors atelectasis or scar. Cardiac catheterization done on 07/09/2013 showing moderate mid LAD disease, moderate to severe three vessel disease, moderate distal RCA disease relatively unchanged from prior study in 2013, LV is mildly dilated and moderately reduced systolic function, ejection fraction of 35%. Medical management is recommended.   HOSPITAL COURSE: This is a 74 year old male with medical problems as mentioned above, presented to the hospital with chest pain and noted to be in rapid atrial fibrillation.    1. Chest pain. The patient was admitted to the hospital, observed overnight in telemetry, had three sets of cardiac markers checked which were negative. He does have  significant risk factors given his history of atrial fibrillation, hypertension, hyperlipidemia, and previous history of coronary artery disease. He, therefore, underwent a cardiac catheterization which showed no significant change from a previous cardiac catheterization in 2013. He does have some disease, although it is not amenable to any intervention. Post cath cardiology recommended medical management. At this point, the patient will be discharged back on his aspirin, beta blocker and statin and some sublingual nitroglycerin as needed. There is some concern that his chest pain was possibly related to his atrial fibrillation with rapid ventricular response and once his rates were controlled his chest pain resolved. There is also some concern that his chest pain could also be gastrointestinal related; therefore, I did increase his omeprazole from once daily to twice daily.  2. Hypertension. The patient remained hemodynamically stable. He will continue his Coreg and lisinopril.  3. Hyperlipidemia. The patient was maintained on his atorvastatin he will resume that.  4. GERD. As mentioned. I did increase his omeprazole from once daily to twice daily.  5. History of chronic atrial fibrillation. The patient is currently rate controlled. He did receive one dose of IV Cardizem when he initially presented to the ER with rapid ventricular response. His rates have been controlled now his oral medications, including Coreg. His Pradaxa was held as he was to have a cardiac catheterization. He was intermittently placed on a heparin nomogram, although he can resume his Pradaxa now.  6. Benign prostatic hypertrophy. The patient had no evidence of urinary obstruction. He will continue his finasteride as stated.   CODE STATUS: THE PATIENT IS A FULL CODE.  TIME SPENT ON DISCHARGE: 35 minutes.   ____________________________ Belia Heman. Verdell Carmine, MD vjs:sg D: 07/09/2013 17:07:56 ET T: 07/10/2013 06:09:17  ET JOB#: [id number]  cc: Belia Heman. Verdell Carmine, MD, <Dictator> Deborra Medina, MD  Henreitta Leber MD ELECTRONICALLY SIGNED 07/21/2013 22:34

## 2014-08-30 NOTE — Assessment & Plan Note (Signed)
He has no symptoms of angina. Continue medical therapy. 

## 2014-08-30 NOTE — Consult Note (Signed)
General Aspect [name] is a 74 yo Caucasian male w/ PMHx s/f CAD (nonobstructive by 07/2013 cath), nonischemic cardiomyopathy (EF 35%) s/p Medtronic dual-chamber ICD, persistent atrial fibrillation (on Xarelto), HTN, HLD, obesity and OSA (on CPAP) who underwent elective lap chole at [facility] today for chronic cholecystitis. He apparently developed a-fib w/ RVR intra-operatively, and thus cardiology was consulted.  He was seen in consultation earlier last month for chest pain radiating to his back w/ associated diaphoresis and tachycardia- he was noted to be in a-fib w/ RVR. These episodes were correlated w/ meals, however given his cardiac history and multiple risk factors, he underwent cardiac catheterization revealing nonobstructive CAD, EF 35%. Carvedilol was increased for further rate-control. His symptoms were attributed to GI etiologies.   On follow-up, he continued to describe the discomfort. He had a severe episode after eating a hamburger and fries almost prompting a repeat ED visit. He was referred to GI where he was diagnosed with cholelithiasis. Elective laparoscopic cholecystectomy was planned for today. Xarelto was held Monday in anticipation of the procedure.   Present Illness . Per the patient and his wife, he underwent the procedure well. There was some intra-operative bleeding which was successfully managed. HR apparently increased sharply in the OR- 160 bpm. They also mentioned he received a "HR medicine" in the OR to reduce the rate. Unable to view on MAR.  He is currently comfortable. C/o abdominal soreness which is decreasing. HR is 70-90s. Telemetry reveals rate-controlled atrial fibrillation.  Initial VS indicates mild hypoxia 87%, improved to 92% on 2L O2 via n/c.  PAST MEDICAL HISTORY Nonobstructive CAD Persistent atrial fibrillation Nonischemic cardiomyopathy, EF 35% Obesity HTN HLD OSA on CPAP  PAST SURGICAL HISTORY Hernia repair Herniated disk repair Cardiac  catheterization x 2 S/p Bi-V ICD Laparoscopic cholecystectomy  SOCIAL HISTORY Lives in [place], [place] w/ wife. Two grown, healthy children. Denies tobacco, EtOH or illicit drug use.  FAMILY HISTORY Brother w/ massive MI at 15. Brother in his 82s w/ multiple CVAs.   Physical Exam:  GEN no acute distress, obese   HEENT pink conjunctivae, PERRL, hearing intact to voice   NECK supple  No masses  trachea midline  JVP 7 cm, no bruits   RESP normal resp effort  clear BS  no use of accessory muscles   CARD irregularly irregular, normal rate, clear S1, S2, no murmurs, rubs, heaves, gallops   ABD positive tenderness  distended  hypoactive BS   EXTR negative cyanosis/clubbing, negative edema   SKIN normal to palpation   NEURO follows commands, motor/sensory function intact   PSYCH alert, A+O to time, place, person   Review of Systems:  Subjective/Chief Complaint tachycardia   Gastrointestinal: Heartburn  Nausea  abdominal pain   Review of Systems: All other systems were reviewed and found to be negative   Home Medications: Medication Instructions Status  nitroglycerin 0.4 mg sublingual tablet 1 tab(s) sublingual every 5 minutes, As Needed Active  omeprazole 20 mg oral delayed release capsule 1 cap(s) orally 2 times a day Active  Xarelto 10 mg oral tablet 1 tab(s) orally once a day (at bedtime) Active  atorvastatin 80 mg oral tablet 1 tab(s) orally once a day (at bedtime) Active  carvedilol 25 mg oral tablet 1 tab(s) orally 2 times a day Active  Coenzyme Q10 10 mg oral capsule 10  orally once a day Active  finasteride 5 mg oral tablet 1 tab(s) orally once a day Active  furosemide 20 mg oral tablet 1  tab(s) orally once a day Active  lisinopril 10 mg oral tablet 1 tab(s) orally once a day Active  spironolactone 25 mg oral tablet 12.5  orally once a day Active  tamsulosin 0.4 mg oral capsule 1 cap(s) orally once a day Active  traMADol 50 mg oral tablet  orally , As Needed - for  Pain Active  primidone 50 mg oral tablet  orally once a day (at bedtime) i Active   EKG:  Interpretation no EKG performed, pending     No Known Allergies:   Vital Signs/Nurse's Notes: **Vital Signs.:   02-Apr-15 12:25  Vital Signs Type Admission  Temperature Temperature (F) 98.2  Celsius 36.7  Temperature Source oral  Pulse Pulse 92  Respirations Respirations 16  Systolic BP Systolic BP 655  Diastolic BP (mmHg) Diastolic BP (mmHg) 72  Mean BP 84  Pulse Ox % Pulse Ox % 87  Pulse Ox Activity Level  At rest  Oxygen Delivery 2L    Impression 74 yo Caucasian male w/ PMHx s/f CAD (nonobstructive by 07/2013 cath), nonischemic cardiomyopathy (EF 35%) s/p Medtronic dual-chamber ICD, persistent atrial fibrillation (on Xarelto), HTN, HLD, obesity and OSA (on CPAP) who underwent elective lap chole at [facility] today for chronic cholecystitis. He developed a-fib w/ RVR intra-operatively, and thus cardiology was consulted.  1. Chronic cholecystitis s/p lap chole Tolerated surgery well w/o complications. Abdominal discomfort managed.  -- Surgery and primary team following -- Check CBC tomorrow  2. Persistent atrial fibrillation w/ RVR Intra-operatively. Rate unknown. Well controlled currently at 70-90 bpm. Suspect peri-operative catecholamine discharge and hypoxia and in the setting of coming off anesthesia contributed. -- Obtain formal 12-lead EKG -- Continue carvedilol -- Place diltiazem IV PRN for HR > 110 bpm -- Resume Xarelto when okay w/ surgery  3. Nonischemic cardiomyopathy EF 35% s/p Medtronic ICD. Appears a bit volume overloaded on exam. Lungs clear, however JVP elevated.  -- Limit IVF -- Control HR, BP -- Check CBC, BMP tomorrow -- Monitor I/Os, daily weights -- Continue ACEi, BB, spiro, Lasix  4. Hypertension Well-controlled.  -- Continue antihypertensives  5. Hyperlipidemia -- Continue atorvastatin  6. OSA -- CPAP while inpatient   Electronic Signatures: Trice Aspinall,  Aisa Schoeppner A (PA-C)  (Signed 02-Apr-15 14:06)  Authored: General Aspect/Present Illness, History and Physical Exam, Review of System, Home Medications, EKG , Allergies, Vital Signs/Nurse's Notes, Impression/Plan Ida Rogue (MD)  (Signed 02-Apr-15 20:11)  Authored: General Aspect/Present Illness, EKG  Co-Signer: General Aspect/Present Illness, History and Physical Exam, Review of System, Home Medications, EKG , Allergies, Vital Signs/Nurse's Notes, Impression/Plan   Last Updated: 02-Apr-15 20:11 by Ida Rogue (MD)

## 2014-08-31 LAB — MDC_IDC_ENUM_SESS_TYPE_REMOTE
Brady Statistic AP VP Percent: 1.11 %
Brady Statistic AS VP Percent: 25.02 %
HIGH POWER IMPEDANCE MEASURED VALUE: 418 Ohm
HighPow Impedance: 73 Ohm
Lead Channel Impedance Value: 608 Ohm
Lead Channel Pacing Threshold Amplitude: 0.5 V
Lead Channel Pacing Threshold Pulse Width: 0.4 ms
Lead Channel Sensing Intrinsic Amplitude: 8 mV
Lead Channel Sensing Intrinsic Amplitude: 8 mV
Lead Channel Setting Pacing Amplitude: 2.75 V
MDC IDC MSMT BATTERY VOLTAGE: 3.02 V
MDC IDC MSMT LEADCHNL RA IMPEDANCE VALUE: 513 Ohm
MDC IDC MSMT LEADCHNL RA SENSING INTR AMPL: 0.625 mV
MDC IDC SESS DTM: 20160413200140
MDC IDC SET LEADCHNL RV PACING AMPLITUDE: 2 V
MDC IDC SET LEADCHNL RV PACING PULSEWIDTH: 0.4 ms
MDC IDC SET LEADCHNL RV SENSING SENSITIVITY: 0.3 mV
MDC IDC SET ZONE DETECTION INTERVAL: 360 ms
MDC IDC STAT BRADY AP VS PERCENT: 0.7 %
MDC IDC STAT BRADY AS VS PERCENT: 73.17 %
MDC IDC STAT BRADY RA PERCENT PACED: 1.81 %
MDC IDC STAT BRADY RV PERCENT PACED: 26.13 %
Zone Setting Detection Interval: 250 ms
Zone Setting Detection Interval: 300 ms
Zone Setting Detection Interval: 350 ms

## 2014-08-31 NOTE — Discharge Summary (Signed)
PATIENT NAME:  Chris Price, JOU MR#:  [id number] DATE OF BIRTH:  Jun 18, 1940  DATE OF ADMISSION:  08/30/2011 DATE OF DISCHARGE:  09/01/2011  PRIMARY CARE PHYSICIAN:  Dr. Jacqualine Code. CARDIOLOGIST: Dr. Saralyn Pilar.   DISCHARGE DIAGNOSES:  1. Acute on chronic systolic heart failure exacerbation.  2. Idiopathic cardiomyopathy.  3. Chronic atrial fibrillation.  4. Hyperlipidemia.   DISCHARGE MEDICATIONS:  1. Coreg 25 mg p.o. b.i.d.  2. Atorvastatin 40 mg p.o. daily.  3. Amlodipine 5 mg p.o. daily.  4. Fish oil 1 capsule p.o. 3 times daily.  5. Glucosamine with chondroitin sulfate.  6. Pradaxa 150 mg p.o. b.i.d.  7. Omeprazole 20 mg p.o. daily.  8. Tamsulosin 0.4 mg p.o. daily.  9. Aldactone 12.5 mg p.o. daily.  10. Amiodarone 400 mg p.o. daily.  11. Coenzyme Q10, 10 mg p.o. daily.   CHANGE IN MEDICATIONS:  1. Lasix. He was taking 20 mg daily, now increased the dose to 40 mg p.o. daily.  2. Potassium 20 mEq p.o. daily, can take with Lasix.   DIET: Low sodium diet. The patient advised to check his weights. If he gains more than 2 pounds in one day or more than five pounds in a week, advised to call the physician.  FOLLOW-UP: The patient has a follow-up appointment with Dr. Virl Axe from the [facility] for possible implantable cardiac defibrillator placement tomorrow, Friday.   CONSULTATION: Cardiology consultation with Dr. Ubaldo Glassing and Dr. Saralyn Pilar.   HOSPITAL COURSE:  1. This is a 74 year old male with history of idiopathic cardiomyopathy, recently diagnosed with ejection fraction of 27% and also history of sleep apnea, hypertension and hyperlipidemia who came in because of shortness of breath for three days. The patient felt short of breath while he was playing golf. Please look at the history and physical for full details. The patient had a cardiac catheterization in April which showed insignificant coronary artery disease as per cardiology note and started on Lasix, Aldactone and Coreg and also  Pradaxa by the cardiologist. The patient also is on amiodarone. The patient is admitted for congestive heart failure exacerbation. His BNP on admission was elevated at 1,469. Troponins have been negative. EKG showed atrial fibrillation at 88 beats per minute on admission with left bundle branch block. The patient's Lasix was increased from 20 mg to 30 IV q.12 and also, his Aldactone increased from 12.5 to 25 mg daily. The patient was followed closely on telemetry. His output has been excellent. His symptoms have resolved. He was seen by the cardiologists, Dr. Saralyn Pilar and Dr. Ubaldo Glassing. They did not have any further recommendations. Advised to followup with Dr. Virl Axe for implantable cardiac defibrillator placement. The patient has an appointment tomorrow with him. The patient's Lasix is changed to 40 mg daily and also Aldactone. He is kept at the same dose at 12.5 mg daily as he is not having any pedal edema and his lungs are clear, so I did not increase the dose of Aldactone, but I increased the dose of Lasix. Advised him to be compliant with medications and also with low salt diet along with checking daily weights.  2. Atrial fibrillation. His heart rate is controlled around the 70s. The patient is on amiodarone and Pradaxa and Coreg. The patient can continue those medications. No change in those.  3. Hyperlipidemia. His fasting LDL is 127 here and HDL is 40 and cholesterol total is 182, which is normal limits. He is on statins. I did not increase the dose. Advised to be  compliant with low fat diet and that can be increased as an outpatient with primary doctor.  4. The patient's renal function has stabilized and also CBC has been stable. EKG showed atrial fib at 88 beats per minute. The patient's WBC and CBC have been within normal limits during the hospital stay.   Condition is stable. The patient's wife is at the bedside and we spoke with her in detail about the plan.        TIME SPENT: More than  30 minutes on discharge preparation and patient education.     ____________________________ Epifanio Lesches, MD sk:ap D: 09/01/2011 10:18:02 ET T: 09/01/2011 13:32:10 ET JOB#: [id number]  cc: Epifanio Lesches, MD, <Dictator> [name], MD Epifanio Lesches MD ELECTRONICALLY SIGNED 09/03/2011 12:35

## 2014-08-31 NOTE — Op Note (Signed)
PATIENT NAME:  Chris Price, Chris Price MR#:  [id number] DATE OF BIRTH:  08/05/1940  DATE OF PROCEDURE:  07/21/2011  PREPROCEDURE DIAGNOSIS: Atrial fibrillation.   PROCEDURE: Elective cardioversion.  ATTENDING: Isaias Cowman, MD   INDICATION: The patient is a 74 year old gentleman with history of sleep apnea and atrial fibrillation.   PROCEDURE: The patient was brought to the special holding area in a fasting state. The patient received 150 mg of propofol. Cardioversion was attempted at 75, 150, and 200 joules with brief conversion to sinus rhythm only to revert to atrial fibrillation with a controlled rate.   ____________________________ Isaias Cowman, MD ap:drc D: 07/21/2011 07:41:59 ET T: 07/21/2011 10:11:02 ET JOB#: [id number]  cc: Isaias Cowman, MD, <Dictator> Isaias Cowman MD ELECTRONICALLY SIGNED 07/29/2011 8:49

## 2014-08-31 NOTE — Consult Note (Signed)
Brief Consult Note: Diagnosis: shortness of breath with wheezing/idiopathic cardiomyopathy/OSA/afib.   Patient was seen by consultant.   Recommend further assessment or treatment.   Comments: Pt with history of idiopathic cardiomyopathy with insignifcant cad per recent cardiac cath admitted iwth progressive shortness of breath and wheezing. CXR unremarkable for significant chf but improved iwth diuretics. Ruled out for mi. Hemodynamically stable. EKG lbbb with afib. Awaiting consideration for aicd/biv pacing per Dr. Jolyn Nap. Anticoagulcated with Pradaxa. Will continue current meds. Careful use of diuretics following renal funciton. Full note to follow.  Electronic Signatures: Teodoro Spray (MD)  (Signed 23-Apr-13 13:50)  Authored: Brief Consult Note   Last Updated: 23-Apr-13 13:50 by Teodoro Spray (MD)

## 2014-08-31 NOTE — Consult Note (Signed)
General Aspect 74 year old male with history of moderate to severe cardiomyopathy with an ejection fraction of 25-30% with insignificant coronary artery disease by cardiac catheterization in April 2013.  He has a history of atrial fibrillation and obstructive sleep apnea along with hypertension and hyperlipidemia.  Based on his cardiomyopathy he is awaiting  consideration for AICD placement per Dr. Virl Price at [facility].  He is anticoagulated with Pradaxa and tolerating this fairly well.  He is hemodynamically stable.  He is ruled out for myocardial infarction.  He has no clinical evidence of congestive heart failure at present.   Physical Exam:   GEN no acute distress    HEENT PERRL    NECK supple  No masses    RESP clear BS    CARD Irregular rate and rhythm  Murmur    Murmur Systolic    Systolic Murmur axilla    ABD denies tenderness  normal BS  no Adominal Mass    LYMPH negative neck    EXTR negative cyanosis/clubbing, negative edema    SKIN normal to palpation    NEURO cranial nerves intact, motor/sensory function intact    PSYCH A+O to time, place, person   Review of Systems:   Subjective/Chief Complaint shortness of breath and anxiety    General: Trouble sleeping    Skin: No Complaints    ENT: No Complaints    Eyes: No Complaints    Neck: No Complaints    Respiratory: Short of breath    Cardiovascular: Dyspnea    Gastrointestinal: No Complaints    Genitourinary: No Complaints    Vascular: No Complaints    Musculoskeletal: No Complaints    Neurologic: No Complaints    Hematologic: No Complaints    Endocrine: No Complaints    Psychiatric: Anxiety    Review of Systems: All other systems were reviewed and found to be negative    Medications/Allergies Reviewed Medications/Allergies reviewed     unsuccessful cardioversion 3/13:    a.fib:    chf:    enlarged heart:    irregular heart rhythm:    sleep apnea:     hypertension:   Home Medications: Medication Instructions Status  Pradaxa 150 mg oral capsule 1 cap(s) orally 2 times a day Active  omeprazole 20 mg oral delayed release capsule 1 cap(s) orally once a day Active  tamsulosin 0.4 mg oral capsule 1 cap(s) orally once a day Active  spironolactone 12.5 milligram(s) orally once a day Active  amiodarone 400 mg oral tablet 1 tab(s) orally once a day Active  Coenzyme Q10 10 mg oral capsule  orally  Active  Lasix 40 mg oral tablet 1 tab(s) orally once a day Active  potassium chloride 20 mEq oral tablet, extended release 1 tab(s) orally once a day Active  glucosamine hydrochloride 1500 mg oral tablet 1 tab(s) orally once a day Active  Fish Oil 1  orally 3 times a day Active  amlodipine 5 mg oral tablet 1 tab(s) orally once a day Active  atorvastatin 40 mg oral tablet 1 tab(s) orally once a day (at bedtime) Active  carvedilol 25 mg oral tablet 1 tab(s) orally 2 times a day Active   EKG:   Interpretation atrial fibrillation    No Known Allergies:     Impression 74 year old male with idiopathic cardiomyopathy, chronic atrial fibrillation which failed cardioversion who is awaiting AICD placement.  He was admitted with concern over shortness of breath and fatigue.  He is ruled out for  myocardial infarction.  EKG reveals atrial fibrillation with controlled ventricular response.  He is on Pradaxa for chronic anticoagulation.  His heart rate is well controlled with carvedilol.   About his cardiomyopathy is anxious to proceed with an AICD as soon as possible.  Has seen Dr. Virl Price in [place] and is scheduled to followup with him for the device.  He appears hemodynamically stable at present.    Plan 1.  Continue with current medications 2.  Will discuss with Dr. Virl Price 3.  Ambulate in the hall and consider discharge if stable.  Would continue with Pradaxa and carvedilol as well as afterload reduction as you are doing.   Electronic  Signatures: Chris Price (MD)  (Signed 28-May-13 09:13)  Authored: General Aspect/Present Illness, History and Physical Exam, Review of System, Past Medical History, Home Medications, EKG , Allergies, Impression/Plan   Last Updated: 28-May-13 09:13 by Chris Price (MD)

## 2014-08-31 NOTE — H&P (Signed)
PATIENT NAME:  Chris Price, Chris Price MR#:  [id number] DATE OF BIRTH:  1941/01/21  DATE OF ADMISSION:  08/30/2011  PRIMARY CARE PHYSICIAN: Dr. Jacqualine Code REFERRING PHYSICIAN: Dr. Reita Cliche  CHIEF COMPLAINT: Shortness of breath for three days.   HISTORY OF PRESENT ILLNESS: 74 year old Caucasian male with a history of hypertension, congestive heart failure with ejection fraction about 27%, atrial fibrillation, obstructive sleep apnea on CPAP presented to the ED with above chief complaint. Patient complains of shortness of breath, cough with whitish sputum for the past three days, especially when he played golf. He feels weak and has wheezing, has some mild chest tightness but denies any orthopnea, nocturnal dyspnea or leg edema. Patient said he lost weight for about eight pounds for the past month. He has been on CPAP at night due to obstructive sleep apnea. Patient has been on Pradaxa for two months instead of aspirin. He got cardiac catheterization on 04/08 which showed coronary artery disease with stenosis. He was planned to get ICD by [facility] cardiology. Patient was treated with Lasix, shortness of breath got better.   PAST MEDICAL HISTORY:  1. Hypertension.  2. Congestive heart failure. 3. History of obstructive sleep apnea. 4. Coronary artery disease.    SOCIAL HISTORY: Denies any smoking, alcohol drinking, or illicit drugs.   PAST SURGICAL HISTORY:  1. Back surgery.  2. Shoulder surgery.   ALLERGIES: No.   MEDICATIONS:  1. Amiodarone 400 mg p.o. daily.  2. Norvasc 5 mg p.o. daily.  3. Atorvastatin 40 mg p.o. at bedtime.  4. Coreg 25 mg p.o. b.i.d.  5. Coenzyme Q-10 10 mg p.o. daily. 6. Fish oil 1 cap t.i.d.  7. Glucosamine hydrochloride 1500 mg p.o. daily.  8. Lasix 20 mg p.o. daily.  9. Naproxen Sodium 220 mg p.o. b.i.d. p.r.n.  10. Omeprazole 20 mg p.o. daily.  11. Pradaxa 150 mg p.o. b.i.d.  12. Spironolactone 12.5 mg p.o. daily stool. Tamsulosin 0.4 mg p.o. daily.    REVIEW OF SYSTEMS:  CONSTITUTIONAL: Patient denies any fever, chills. No headache or dizziness but has weakness. EYES: No double vision, blurred vision. ENT: No epistaxis, discharge, or postnasal drip. No dysphagia. RESPIRATORY: Positive for cough, sputum, wheezing, shortness of breath. No hemoptysis. CARDIOVASCULAR: No chest pain, palpitations, orthopnea, or nocturnal dyspnea. No leg edema. GASTROINTESTINAL: No abdominal pain, nausea, vomiting, or diarrhea. No melena or bloody stool. GENITOURINARY: No dysuria, hematuria but has urine frequency. ENDOCRINE: No head or cold intolerance. No polyuria, polydipsia. HEMATOLOGY: No easy bruising or bleeding. NEUROLOGY: No syncope, loss of consciousness or seizure. SKIN: No rash or jaundice. PSYCH: No depression. No anxiety.   PHYSICAL EXAMINATION:  VITAL SIGNS: Temperature 97.7, blood pressure 146/89, pulse 79, respirations 18, oxygen saturation 92% on room air.   GENERAL: Patient is alert, awake, oriented in no acute distress.   HEENT: Pupils round, equal, reactive to light, accommodation. Moist oral mucosa. Clear oropharynx.   NECK: Supple. No JVD or carotid bruits. No lymphadenopathy. No thyromegaly.   CARDIOVASCULAR: S1, S2, irregular rate, rhythm. No murmurs, gallops.  PULMONARY: Bilateral air entry. Mild rales bilateral. No wheezing or crackles. No use of muscles to breathe.   ABDOMEN: Soft. No distention or tenderness. No organomegaly. Bowel sounds present.   EXTREMITIES: No edema, clubbing, or cyanosis. No calf tenderness. Strong bilateral pedal pulses.   SKIN: No rash or jaundice.   NEUROLOGIC: Alert and oriented x3. No focal deficits. Power 5/5. Sensation intact.   LABORATORY, DIAGNOSTIC AND RADIOLOGICAL DATA: BNP 1469, CK 300, CK-MB 5.6. Troponin  level less than 0.02 twice.   CBC normal.   Glucose 122, BUN 16, creatinine 1.26. Electrolytes normal.   EKG showed atrial fibrillation at 88 beats per minute with left bundle branch block   IMPRESSION:   1. Congestive heart failure decompensation with ejection fraction about 25%, possible both systolic and diastolic dysfunction,  acute on chronic.  2. Atrial fibrillation, rate controlled.  3. Coronary artery disease.  4. Hypertension.  5. Obstructive sleep apnea.   PLAN OF TREATMENT:  1. Patient will be admitted to telemetry floor. Will continue O2 by nasal cannula. Start daily weights, strict input and output. Congestive heart failure protocol.  2. Lasix 30 mg IV q.12 hours, increase spironolactone to 25 mg p.o. daily and continue Coreg, Norvasc.  3. For atrial fibrillation, continue amiodarone, Pradaxa.  4. For coronary artery disease, continue atorvastatin.  5. We will get a cardiology consult from [facility] cardiology.  6. GI and deep vein thrombosis prophylaxis.   Discussed the patient's situation and plan of treatment with patient and the patient's wife.     TIME SPENT: About 65 minutes.   ____________________________ Demetrios Loll, MD qc:cms D: 08/30/2011 03:59:30 ET T: 08/30/2011 08:15:48 ET JOB#: [id number]  cc: Demetrios Loll, MD, <Dictator> Milinda Pointer. Jacqualine Code, MD Demetrios Loll MD ELECTRONICALLY SIGNED 08/31/2011 1:25

## 2014-09-09 ENCOUNTER — Encounter: Payer: Self-pay | Admitting: Internal Medicine

## 2014-09-26 ENCOUNTER — Encounter: Payer: Self-pay | Admitting: Cardiology

## 2014-11-13 ENCOUNTER — Encounter: Payer: Self-pay | Admitting: Internal Medicine

## 2014-11-17 ENCOUNTER — Telehealth: Payer: Self-pay | Admitting: *Deleted

## 2014-11-17 MED ORDER — CARVEDILOL 12.5 MG PO TABS: 12.5000 mg | ORAL_TABLET | Freq: Two times a day (BID) | ORAL | Status: DC

## 2014-11-17 NOTE — Telephone Encounter (Signed)
Refill sent for carvedilol.  

## 2014-11-17 NOTE — Telephone Encounter (Signed)
°  1. Which medications need to be refilled? Carvedilol   2. Which pharmacy is medication to be sent to? [facility] in [place] just to get him enough and then send the rest to [facility] mail order   3. Do they need a 30 day or 90 day supply? 30 days and then 90 for humana   4. Would they like a call back once the medication has been sent to the pharmacy? No

## 2014-11-19 ENCOUNTER — Encounter: Payer: Self-pay | Admitting: Internal Medicine

## 2014-11-20 ENCOUNTER — Encounter: Payer: Self-pay | Admitting: Internal Medicine

## 2014-11-20 ENCOUNTER — Ambulatory Visit (INDEPENDENT_AMBULATORY_CARE_PROVIDER_SITE_OTHER): Payer: Medicare PPO | Admitting: *Deleted

## 2014-11-20 DIAGNOSIS — I5022 Chronic systolic (congestive) heart failure: Secondary | ICD-10-CM | POA: Diagnosis not present

## 2014-11-20 DIAGNOSIS — I429 Cardiomyopathy, unspecified: Secondary | ICD-10-CM

## 2014-11-20 NOTE — Progress Notes (Signed)
Remote ICD transmission.   

## 2014-11-24 LAB — CUP PACEART REMOTE DEVICE CHECK
Battery Voltage: 3 V
Brady Statistic AP VS Percent: 0.78 %
Brady Statistic AS VP Percent: 25.41 %
Brady Statistic AS VS Percent: 72.6 %
Brady Statistic RA Percent Paced: 1.99 %
Brady Statistic RV Percent Paced: 26.62 %
HighPow Impedance: 399 Ohm
HighPow Impedance: 72 Ohm
Lead Channel Impedance Value: 475 Ohm
Lead Channel Impedance Value: 608 Ohm
Lead Channel Pacing Threshold Pulse Width: 0.4 ms
Lead Channel Sensing Intrinsic Amplitude: 0.75 mV
Lead Channel Sensing Intrinsic Amplitude: 7.375 mV
Lead Channel Sensing Intrinsic Amplitude: 7.375 mV
Lead Channel Setting Pacing Amplitude: 2 V
Lead Channel Setting Pacing Pulse Width: 0.4 ms
Lead Channel Setting Sensing Sensitivity: 0.3 mV
MDC IDC MSMT LEADCHNL RA PACING THRESHOLD AMPLITUDE: 1.25 V
MDC IDC MSMT LEADCHNL RA PACING THRESHOLD PULSEWIDTH: 0.4 ms
MDC IDC MSMT LEADCHNL RA SENSING INTR AMPL: 0.75 mV
MDC IDC MSMT LEADCHNL RV PACING THRESHOLD AMPLITUDE: 0.5 V
MDC IDC SESS DTM: 20160714041807
MDC IDC SET LEADCHNL RA PACING AMPLITUDE: 2.75 V
MDC IDC SET ZONE DETECTION INTERVAL: 300 ms
MDC IDC STAT BRADY AP VP PERCENT: 1.21 %
Zone Setting Detection Interval: 250 ms
Zone Setting Detection Interval: 350 ms
Zone Setting Detection Interval: 360 ms

## 2014-12-10 ENCOUNTER — Other Ambulatory Visit: Payer: Self-pay | Admitting: *Deleted

## 2014-12-10 ENCOUNTER — Telehealth: Payer: Self-pay | Admitting: *Deleted

## 2014-12-10 MED ORDER — ATORVASTATIN CALCIUM 80 MG PO TABS: ORAL_TABLET | ORAL | Status: DC

## 2014-12-10 NOTE — Telephone Encounter (Signed)
  1. Which medications need to be refilled? Atorvastin 80   2. Which pharmacy is medication to be sent to? [facility]  3. Do they need a 30 day or 90 day supply? 90 day  4. Would they like a call back once the medication has been sent to the pharmacy? Yes please

## 2014-12-17 ENCOUNTER — Encounter: Payer: Self-pay | Admitting: Cardiology

## 2014-12-25 ENCOUNTER — Other Ambulatory Visit: Payer: Self-pay | Admitting: Cardiovascular Disease

## 2015-01-01 ENCOUNTER — Other Ambulatory Visit: Payer: Self-pay | Admitting: Cardiovascular Disease

## 2015-03-03 ENCOUNTER — Ambulatory Visit (INDEPENDENT_AMBULATORY_CARE_PROVIDER_SITE_OTHER): Payer: Medicare PPO | Admitting: Internal Medicine

## 2015-03-03 ENCOUNTER — Encounter: Payer: Self-pay | Admitting: Internal Medicine

## 2015-03-03 VITALS — BP 114/84 | HR 73 | Ht 66.0 in | Wt 216.5 lb

## 2015-03-03 DIAGNOSIS — I454 Nonspecific intraventricular block: Secondary | ICD-10-CM

## 2015-03-03 DIAGNOSIS — I482 Chronic atrial fibrillation, unspecified: Secondary | ICD-10-CM

## 2015-03-03 DIAGNOSIS — I480 Paroxysmal atrial fibrillation: Secondary | ICD-10-CM | POA: Diagnosis not present

## 2015-03-03 DIAGNOSIS — Z95810 Presence of automatic (implantable) cardiac defibrillator: Secondary | ICD-10-CM

## 2015-03-03 DIAGNOSIS — I429 Cardiomyopathy, unspecified: Secondary | ICD-10-CM | POA: Diagnosis not present

## 2015-03-03 DIAGNOSIS — I5022 Chronic systolic (congestive) heart failure: Secondary | ICD-10-CM

## 2015-03-03 LAB — CUP PACEART INCLINIC DEVICE CHECK
Battery Voltage: 2.98 V
Brady Statistic RA Percent Paced: 1.89 %
HighPow Impedance: 418 Ohm
HighPow Impedance: 71 Ohm
Implantable Lead Implant Date: 20130502
Implantable Lead Implant Date: 20130502
Implantable Lead Model: 5076
Implantable Lead Model: 6935
Lead Channel Impedance Value: 513 Ohm
Lead Channel Impedance Value: 608 Ohm
Lead Channel Pacing Threshold Amplitude: 1.25 V
Lead Channel Pacing Threshold Pulse Width: 0.4 ms
Lead Channel Sensing Intrinsic Amplitude: 0.75 mV
Lead Channel Sensing Intrinsic Amplitude: 6.875 mV
Lead Channel Setting Sensing Sensitivity: 0.3 mV
MDC IDC LEAD LOCATION: 753859
MDC IDC LEAD LOCATION: 753860
MDC IDC MSMT LEADCHNL RA SENSING INTR AMPL: 0.75 mV
MDC IDC MSMT LEADCHNL RV PACING THRESHOLD AMPLITUDE: 0.5 V
MDC IDC MSMT LEADCHNL RV PACING THRESHOLD PULSEWIDTH: 0.4 ms
MDC IDC MSMT LEADCHNL RV SENSING INTR AMPL: 9.375 mV
MDC IDC SESS DTM: 20161025123150
MDC IDC SET LEADCHNL RV PACING AMPLITUDE: 2 V
MDC IDC SET LEADCHNL RV PACING PULSEWIDTH: 0.4 ms
MDC IDC STAT BRADY AP VP PERCENT: 1.16 %
MDC IDC STAT BRADY AP VS PERCENT: 0.73 %
MDC IDC STAT BRADY AS VP PERCENT: 25.81 %
MDC IDC STAT BRADY AS VS PERCENT: 72.3 %
MDC IDC STAT BRADY RV PERCENT PACED: 26.97 %

## 2015-03-03 MED ORDER — RIVAROXABAN 20 MG PO TABS: 20.0000 mg | ORAL_TABLET | Freq: Every day | ORAL | Status: DC

## 2015-03-03 MED ORDER — CARVEDILOL 12.5 MG PO TABS: 12.5000 mg | ORAL_TABLET | Freq: Two times a day (BID) | ORAL | Status: DC

## 2015-03-03 NOTE — Patient Instructions (Signed)
Medication Instructions: - no changes  Labwork: - none  Procedures/Testing: - none  Follow-Up: - Remote monitoring is used to monitor your Pacemaker of ICD from home. This monitoring reduces the number of office visits required to check your device to one time per year. It allows [place] to keep an eye on the functioning of your device to ensure it is working properly. You are scheduled for a device check from home on 06/02/15. You may send your transmission at any time that day. If you have a wireless device, the transmission will be sent automatically. After your physician reviews your transmission, you will receive a postcard with your next transmission date.  - Your physician wants you to follow-up in: 1 year with Dr. Caryl Comes. You will receive a reminder letter in the mail two months in advance. If you don't receive a letter, please call our office to schedule the follow-up appointment.  Any Additional Special Instructions Will Be Listed Below (If Applicable). - none

## 2015-03-03 NOTE — Progress Notes (Signed)
Patient Care Team: Crecencio Mc, MD as PCP - General (Internal Medicine)   HPI  Chris Price is a 74 y.o. male Seen in followup for an ICD implanted for primary prevention in nonischemic cardiomyopathy further complicated by sinus bradycardia-date of implant May 2013. He also has a history of atrial fibrillation for which he was managed with dabigitran and amiodarone. He developed a significant intention tremor prompting the down titration and subsequent discontinuation of his amiodarone  Catheterization 3/15 nonobstructive coronary disease ejection fraction 35%  There have been no appreciable change in his tremor. The patient denies chest pain, shortness of breath, nocturnal dyspnea, orthopnea or peripheral edema.  There have been no palpitations, lightheadedness or syncope.         Past Medical History  Diagnosis Date  . Lumbar spinal stenosis   . Degenerative disk disease     lumbar  . Disc herniation   . Hyperlipidemia   . Cardiomyopathy -nonischemic     moderate to severe EF 25-30% March 2013  . Chronic systolic heart failure ([place]) 08/25/2011  . ICD (implantable cardiac defibrillator) in place   . CHF (congestive heart failure) ([place])   . Sleep apnea     uses cpap  . GERD (gastroesophageal reflux disease)   . Neuromuscular disorder ([place])     neauropathy   / siatica  . Hypertension   . Atrial fibrillation ([place])   . IVCD (intraventricular conduction defect)     Nonspecific QRS duration 122  . Persistent atrial fibrillation ([place]) 11/07/2011  . Coronary artery disease 08/15/2011    Moderate nonobstructive CAD with 60% mid LAD, 60% proximal RCA and 30 % stenossis left circumflex. Cardiac catheterization in March of 2015 showed no significant change     Past Surgical History  Procedure Laterality Date  . Lumbar laminectomy      L2 through S1 with wide decompression of the thecal sac and nerve roots.  . Cardioversion  08/2011    [facility]  . Hernia repair    . Icd   09/08/2011  . Back surgery    . Cooled thermotherapy  5188    [place], complicated by incontinence  . Prostate biopsy  2013  . Cardiac catheterization  08/15/2011    [facility]   . Cardiac catheterization  07/09/2013    [facility]  . Cholecystectomy  April 2015  . Implantable cardioverter defibrillator implant  09/08/2011    Procedure: IMPLANTABLE CARDIOVERTER DEFIBRILLATOR IMPLANT;  Surgeon: [name], MD;  Location: [facility] CATH LAB;  Service: Cardiovascular;;    Current Outpatient Prescriptions  Medication Sig Dispense Refill  . atorvastatin (LIPITOR) 80 MG tablet TAKE 1 TABLET EVERY DAY 90 tablet 3  . carvedilol (COREG) 12.5 MG tablet Take 1 tablet (12.5 mg total) by mouth 2 (two) times daily with a meal. 180 tablet 3  . finasteride (PROSCAR) 5 MG tablet Take 5 mg by mouth daily.    . furosemide (LASIX) 20 MG tablet TAKE 1 TABLET EVERY DAY 90 tablet 3  . lisinopril (PRINIVIL,ZESTRIL) 5 MG tablet Take 5 mg by mouth daily.    Marland Kitchen loperamide (IMODIUM A-D) 2 MG tablet Take 2 mg by mouth as needed for diarrhea or loose stools.    . nitroGLYCERIN (NITROSTAT) 0.4 MG SL tablet Place 0.4 mg under the tongue every 5 (five) minutes as needed for chest pain.    Marland Kitchen omeprazole (PRILOSEC) 20 MG capsule Take 1 capsule (20 mg total) by mouth 2 (two) times daily before a meal. Acid  reflux. 180 capsule 1  . rivaroxaban (XARELTO) 20 MG TABS tablet Take 1 tablet (20 mg total) by mouth daily with supper. 90 tablet 3  . spironolactone (ALDACTONE) 25 MG tablet TAKE 1/2 TABLET EVERY DAY 45 tablet 3  . Tamsulosin HCl (FLOMAX) 0.4 MG CAPS Take 0.4 mg by mouth daily.     No current facility-administered medications for this visit.    No Known Allergies  Review of Systems negative except from HPI and PMH  Physical Exam BP 114/84 mmHg  Pulse 73  Ht 5\' 6"  (1.676 m)  Wt 216 lb 8 oz (98.204 kg)  BMI 34.96 kg/m2 Well developed and well nourished in no acute distress HENT normal E scleral and icterus clear Neck Supple JVP  flat; carotids brisk and full Clear to ausculation  Device pocket well healed; without hematoma or erythema.  There is no tethering  Irregularly irregular Regular rate and rhythm, no murmurs gallops or rub Soft with active bowel sounds No clubbing cyanosis none Edema Alert and oriented, grossly normal motor and sensory function; mild tremor Skin Warm and Dry  ECG atrial fibrillation with intermittent ventricular pacing at 70  Intervals-/13/40  Mild   exercise increase his heart rate from 70-80. Review of his histograms have heart rates in the 200s to history status suggests most of his heart rates are well controlled and there is a diminishing volume with heart rates faster than   Assessment and  Plan  NICM  Afib-permanent  ICD-MDT The patient's device was interrogated.  The information was reviewed. No changes were made in the programming.    Labs ok 6/15   Euvolemic  Continue anticoagulation

## 2015-03-24 ENCOUNTER — Encounter: Payer: Self-pay | Admitting: Internal Medicine

## 2015-03-24 ENCOUNTER — Telehealth: Payer: Self-pay | Admitting: Cardiovascular Disease

## 2015-03-24 NOTE — Telephone Encounter (Signed)
Per Patient Chris Price wanted him to rotate fu's with he and [place].  Patient was due for fu with [name] in October but patient wants to wait as suggested and see him in April.  Schedule is not yet open.  will call patient when April schedule is open.

## 2015-04-08 ENCOUNTER — Telehealth: Payer: Self-pay | Admitting: Internal Medicine

## 2015-04-08 DIAGNOSIS — M5126 Other intervertebral disc displacement, lumbar region: Secondary | ICD-10-CM

## 2015-04-08 NOTE — Telephone Encounter (Signed)
Pt wife called about pt needing a referral to the chiropractor (Dr Freddi Che) [phone]. If needed. Thank You!

## 2015-04-09 DIAGNOSIS — M5126 Other intervertebral disc displacement, lumbar region: Secondary | ICD-10-CM | POA: Insufficient documentation

## 2015-04-09 NOTE — Telephone Encounter (Signed)
Patient notified

## 2015-04-09 NOTE — Telephone Encounter (Signed)
Referral is in process as requested 

## 2015-05-13 ENCOUNTER — Ambulatory Visit (INDEPENDENT_AMBULATORY_CARE_PROVIDER_SITE_OTHER): Payer: Medicare PPO

## 2015-05-13 VITALS — BP 118/80 | HR 85 | Temp 98.1°F | Resp 14 | Ht 66.0 in | Wt 221.8 lb

## 2015-05-13 DIAGNOSIS — Z0000 Encounter for general adult medical examination without abnormal findings: Secondary | ICD-10-CM

## 2015-05-13 NOTE — Progress Notes (Signed)
  I have reviewed the above information and agree with above.   Bastion Bolger, MD 

## 2015-05-13 NOTE — Progress Notes (Signed)
Subjective:   Chris Price is a 75 y.o. male who presents for an Initial Medicare Annual Wellness Visit.  Review of Systems  No ROS.  Medicare Wellness Visit.  Cardiac Risk Factors include: advanced age (>53men, >50 women);hypertension    Objective:    Today's Vitals   05/13/15 1438  BP: 118/80  Pulse: 85  Temp: 98.1 F (36.7 C)  TempSrc: Oral  Resp: 14  Height: 5\' 6"  (1.676 m)  Weight: 221 lb 12.8 oz (100.608 kg)  SpO2: 97%    Current Medications (verified) Outpatient Encounter Prescriptions as of 05/13/2015  Medication Sig  . atorvastatin (LIPITOR) 80 MG tablet TAKE 1 TABLET EVERY DAY  . carvedilol (COREG) 12.5 MG tablet Take 1 tablet (12.5 mg total) by mouth 2 (two) times daily with a meal.  . finasteride (PROSCAR) 5 MG tablet Take 5 mg by mouth daily.  . furosemide (LASIX) 20 MG tablet TAKE 1 TABLET EVERY DAY  . lisinopril (PRINIVIL,ZESTRIL) 5 MG tablet Take 5 mg by mouth daily.  Marland Kitchen loperamide (IMODIUM A-D) 2 MG tablet Take 2 mg by mouth as needed for diarrhea or loose stools.  . nitroGLYCERIN (NITROSTAT) 0.4 MG SL tablet Place 0.4 mg under the tongue every 5 (five) minutes as needed for chest pain.  Marland Kitchen omeprazole (PRILOSEC) 20 MG capsule Take 1 capsule (20 mg total) by mouth 2 (two) times daily before a meal. Acid reflux.  . rivaroxaban (XARELTO) 20 MG TABS tablet Take 1 tablet (20 mg total) by mouth daily with supper.  Marland Kitchen spironolactone (ALDACTONE) 25 MG tablet TAKE 1/2 TABLET EVERY DAY  . Tamsulosin HCl (FLOMAX) 0.4 MG CAPS Take 0.4 mg by mouth daily.   No facility-administered encounter medications on file as of 05/13/2015.    Allergies (verified) Review of patient's allergies indicates no known allergies.   History: Past Medical History  Diagnosis Date  . Lumbar spinal stenosis   . Degenerative disk disease     lumbar  . Disc herniation   . Hyperlipidemia   . Cardiomyopathy -nonischemic     moderate to severe EF 25-30% March 2013  . Chronic systolic  heart failure ([place]) 08/25/2011  . ICD (implantable cardiac defibrillator) in place   . CHF (congestive heart failure) ([place])   . Sleep apnea     uses cpap  . GERD (gastroesophageal reflux disease)   . Neuromuscular disorder ([place])     neauropathy   / siatica  . Hypertension   . Atrial fibrillation ([place])   . IVCD (intraventricular conduction defect)     Nonspecific QRS duration 122  . Persistent atrial fibrillation ([place]) 11/07/2011  . Coronary artery disease 08/15/2011    Moderate nonobstructive CAD with 60% mid LAD, 60% proximal RCA and 30 % stenossis left circumflex. Cardiac catheterization in March of 2015 showed no significant change    Past Surgical History  Procedure Laterality Date  . Lumbar laminectomy      L2 through S1 with wide decompression of the thecal sac and nerve roots.  . Cardioversion  08/2011    [facility]  . Hernia repair    . Icd  09/08/2011  . Back surgery    . Cooled thermotherapy  [id number]    [place], complicated by incontinence  . Prostate biopsy  2013  . Cardiac catheterization  08/15/2011    [facility]   . Cardiac catheterization  07/09/2013    [facility]  . Cholecystectomy  April 2015  . Implantable cardioverter defibrillator implant  09/08/2011  Procedure: IMPLANTABLE CARDIOVERTER DEFIBRILLATOR IMPLANT;  Surgeon: [name], MD;  Location: [facility] CATH LAB;  Service: Cardiovascular;;   Family History  Problem Relation Age of Onset  . Heart attack Father   . Heart attack Brother    Social History   Occupational History  . Not on file.   Social History Main Topics  . Smoking status: Never Smoker   . Smokeless tobacco: Never Used  . Alcohol Use: No  . Drug Use: No  . Sexual Activity: Not Currently   Tobacco Counseling Counseling given: Not Answered   Activities of Daily Living In your present state of health, do you have any difficulty performing the following activities: 05/13/2015  Hearing? Y  Vision? N  Difficulty concentrating or making decisions? N  Walking or  climbing stairs? Y  Dressing or bathing? N  Doing errands, shopping? N  Preparing Food and eating ? N  Using the Toilet? N  In the past six months, have you accidently leaked urine? N  Do you have problems with loss of bowel control? N  Managing your Medications? N  Managing your Finances? N  Housekeeping or managing your Housekeeping? N    Immunizations and Health Maintenance Immunization History  Administered Date(s) Administered  . Influenza Split 02/19/2012  . Influenza-Unspecified 02/08/2013  . Pneumococcal Polysaccharide-23 03/21/2012  . Tdap 02/28/2011   Health Maintenance Due  Topic Date Due  . ZOSTAVAX  01/22/1999  . PNA vac Low Risk Adult (2 of 2 - PCV13) 03/21/2013  . INFLUENZA VACCINE  12/08/2014    Patient Care Team: Crecencio Mc, MD as PCP - General (Internal Medicine)  Indicate any recent Medical Services you may have received from other than [facility] providers in the past year (date may be approximate).    Assessment:   This is a routine wellness examination for Chris Price.  The goal of the wellness visit is to assist the patient how to close the gaps in care and create a preventative care plan for the patient.   Osteoporosis risk reviewed.  Medications reviewed; taking without issues or barriers.   Safety issues reviewed; smoke detectors in the home. Firearms locked in a secure area in the home.  Wears seatbelts when driving or riding with others. No violence in the home.  ZOSTAVAX postponed per patient request.  Follow up with insurance and PCP. Education provided.  No identified risk were noted; The patient was oriented x 3; appropriate in dress and manner and no objective failures at ADL's or IADL's.   Atrial fibrillation -stable and followed by Dr. Fletcher Anon and Dr. Caryl Comes Chr systolic hrt failur-stable and followed by Dr. Fletcher Anon and Caryl Comes Chronic atrial fibrilla-stable and followed by Dr. Fletcher Anon and Dr. Caryl Comes Chronic systolic con-stable and followed by  Dr. Caryl Comes and Dr. Rowe Robert cardiomyopathy NEC-stable and followed by Dr. Fletcher Anon and Dr. Caryl Comes Cardiomyopathy, unspecified-stable and followed by Dr. Fletcher Anon and Dr. Caryl Comes Paroxysmal atrial fibrillation-stable and followed by Dr. Fletcher Anon and Dr. Caryl Comes  Patient Concerns: Wax in both ears; difficulty hearing clearly.  Deferred to PCP for follow up.  Appointment scheduled.  Hearing/Vision screen Hearing Screening Comments: Did not pass the whisper test.   Difficulty hearing clearly. Declined immediate referral to ENT.  Vision Screening Comments: Followed by [facility] Annual visits Wears glasses  Dietary issues and exercise activities discussed: Current Exercise Habits:: Home exercise routine (Walks the golf course daily.), Type of exercise: walking, Time (Minutes): 30, Frequency (Times/Week): 6, Weekly Exercise (Minutes/Week): 180, Intensity: Mild  Goals    . Increase physical activity     Currently walks 1 mile daily. Walk 3 miles daily.      Depression Screen PHQ 2/9 Scores 05/13/2015 02/27/2013  PHQ - 2 Score 0 0    Fall Risk Fall Risk  05/13/2015 02/27/2013  Falls in the past year? No No    Cognitive Function: MMSE - Mini Mental State Exam 05/13/2015  Orientation to time 5  Orientation to Place 5  Registration 3  Attention/ Calculation 5  Recall 3  Language- name 2 objects 2  Language- repeat 1  Language- follow 3 step command 3  Language- read & follow direction 1  Write a sentence 1  Copy design 1  Total score 30    Screening Tests Health Maintenance  Topic Date Due  . ZOSTAVAX  01/22/1999  . PNA vac Low Risk Adult (2 of 2 - PCV13) 03/21/2013  . INFLUENZA VACCINE  12/08/2014  . TETANUS/TDAP  02/27/2021        Plan:   End of life planning; Advance aging; Advanced directives discussed. Current HCPOA/Living Will on file.   Follow up with PCP as needed.  During the course of the visit Chris Price was educated and counseled about the following  appropriate screening and preventive services:   Vaccines to include Pneumoccal, Influenza, Hepatitis B, Td, Zostavax, HCV  Electrocardiogram  Colorectal cancer screening  Cardiovascular disease screening  Diabetes screening  Glaucoma screening  Nutrition counseling  Prostate cancer screening  Smoking cessation counseling  Patient Instructions (the written plan) were given to the patient.   Varney Biles, LPN   [id number]

## 2015-05-13 NOTE — Patient Instructions (Addendum)
Chris Price,  Thank you for taking time to come for your Medicare Wellness Visit.  I appreciate your ongoing commitment to your health goals. Please review the following plan we discussed and let me know if I can assist you in the future.  Return for ear cleaning tomorrow and Prevnar 13.  Follow up with PCP as needed.  Hearing Loss Hearing loss is a partial or total loss of the ability to hear. This can be temporary or permanent, and it can happen in one or both ears. Hearing loss may be referred to as deafness. Medical care is necessary to treat hearing loss properly and to prevent the condition from getting worse. Your hearing may partially or completely come back, depending on what caused your hearing loss and how severe it is. In some cases, hearing loss is permanent. CAUSES Common causes of hearing loss include:   Too much wax in the ear canal.   Infection of the ear canal or middle ear.   Fluid in the middle ear.   Injury to the ear or surrounding area.   An object stuck in the ear.   Prolonged exposure to loud sounds, such as music.  Less common causes of hearing loss include:   Tumors in the ear.   Viral or bacterial infections, such as meningitis.   A hole in the eardrum (perforated eardrum).  Problems with the hearing nerve that sends signals between the brain and the ear.  Certain medicines.  SYMPTOMS  Symptoms of this condition may include:  Difficulty telling the difference between sounds.  Difficulty following a conversation when there is background noise.  Lack of response to sounds in your environment. This may be most noticeable when you do not respond to startling sounds.  Needing to turn up the volume on the television, radio, etc.  Ringing in the ears.  Dizziness.  Pain in the ears. DIAGNOSIS This condition is diagnosed based on a physical exam and a hearing test (audiometry). The audiometry test will be performed by a hearing  specialist (audiologist). You may also be referred to an ear, nose, and throat (ENT) specialist (otolaryngologist).  TREATMENT Treatment for recent onset of hearing loss may include:   Ear wax removal.   Being prescribed medicines to prevent infection (antibiotics).   Being prescribed medicines to reduce inflammation (corticosteroids).  HOME CARE INSTRUCTIONS  If you were prescribed an antibiotic medicine, take it as told by your health care provider. Do not stop taking the antibiotic even if you start to feel better.  Take over-the-counter and prescription medicines only as told by your health care provider.  Avoid loud noises.   Return to your normal activities as told by your health care provider. Ask your health care provider what activities are safe for you.  Keep all follow-up visits as told by your health care provider. This is important. SEEK MEDICAL CARE IF:   You feel dizzy.   You develop new symptoms.   You vomit or feel nauseous.   You have a fever.  SEEK IMMEDIATE MEDICAL CARE IF:  You develop sudden changes in your vision.   You have severe ear pain.   You have new or increased weakness.  You have a severe headache.   This information is not intended to replace advice given to you by your health care provider. Make sure you discuss any questions you have with your health care provider.   Document Released: 04/25/2005 Document Revised: 01/14/2015 Document Reviewed: 09/10/2014 Elsevier Interactive Patient  Education 2016 [employer].

## 2015-05-14 ENCOUNTER — Ambulatory Visit (INDEPENDENT_AMBULATORY_CARE_PROVIDER_SITE_OTHER): Payer: Medicare PPO

## 2015-05-14 DIAGNOSIS — H6121 Impacted cerumen, right ear: Secondary | ICD-10-CM

## 2015-05-14 DIAGNOSIS — H918X1 Other specified hearing loss, right ear: Secondary | ICD-10-CM | POA: Diagnosis not present

## 2015-05-14 DIAGNOSIS — Z23 Encounter for immunization: Secondary | ICD-10-CM

## 2015-05-14 NOTE — Progress Notes (Signed)
Patient came in for Prevnar injection and a Ear irrigation.  Received injection in Left Deltoid.  Patient tolerated well.    Inspected both ears, had patient see Dr. Derrel Nip for inspection also.  Per Dr. Derrel Nip irrigated only right ear.  Visualized large amount of orange dark wax in ear.  Irrigated with Peroxide and warm water.  Large clump of orange tinge colored thick wax removed.  Patient requested left ear to be looked at again, I verbalized to him that there was minimal amount of wax noted and that Dr. Derrel Nip requested me clean just the right.    Please advise.

## 2015-05-14 NOTE — Progress Notes (Signed)
  I have reviewed the above information and agree with above.   Aarush Stukey, MD 

## 2015-06-02 ENCOUNTER — Ambulatory Visit (INDEPENDENT_AMBULATORY_CARE_PROVIDER_SITE_OTHER): Payer: Medicare PPO | Admitting: *Deleted

## 2015-06-02 DIAGNOSIS — I429 Cardiomyopathy, unspecified: Secondary | ICD-10-CM | POA: Diagnosis not present

## 2015-06-02 DIAGNOSIS — I5022 Chronic systolic (congestive) heart failure: Secondary | ICD-10-CM

## 2015-06-03 NOTE — Progress Notes (Signed)
Remote ICD transmission.   

## 2015-06-04 ENCOUNTER — Telehealth: Payer: Self-pay | Admitting: Internal Medicine

## 2015-06-04 DIAGNOSIS — Z1211 Encounter for screening for malignant neoplasm of colon: Secondary | ICD-10-CM

## 2015-06-04 NOTE — Telephone Encounter (Signed)
Please advise for referral.  Thanks

## 2015-06-04 NOTE — Telephone Encounter (Signed)
Referral is in process as requested 

## 2015-06-04 NOTE — Telephone Encounter (Signed)
Pt's wife called and states that they received a letter from [facility] stating that it is time for Chris Price's colonoscopy with Dr. Donnella Sham. Last one was 2012. Please enter referral for insurance purposes. Thanks, [employer]

## 2015-06-16 LAB — CUP PACEART REMOTE DEVICE CHECK
Battery Voltage: 2.97 V
Brady Statistic AS VP Percent: 18.98 %
Date Time Interrogation Session: 20170124062408
HighPow Impedance: 418 Ohm
HighPow Impedance: 74 Ohm
Implantable Lead Location: 753859
Lead Channel Impedance Value: 475 Ohm
Lead Channel Pacing Threshold Amplitude: 1.25 V
Lead Channel Sensing Intrinsic Amplitude: 0.625 mV
Lead Channel Sensing Intrinsic Amplitude: 0.625 mV
Lead Channel Sensing Intrinsic Amplitude: 8 mV
Lead Channel Setting Sensing Sensitivity: 0.3 mV
MDC IDC LEAD IMPLANT DT: 20130502
MDC IDC LEAD IMPLANT DT: 20130502
MDC IDC LEAD LOCATION: 753860
MDC IDC LEAD MODEL: 6935
MDC IDC MSMT LEADCHNL RA PACING THRESHOLD PULSEWIDTH: 0.4 ms
MDC IDC MSMT LEADCHNL RV IMPEDANCE VALUE: 608 Ohm
MDC IDC MSMT LEADCHNL RV PACING THRESHOLD AMPLITUDE: 0.625 V
MDC IDC MSMT LEADCHNL RV PACING THRESHOLD PULSEWIDTH: 0.4 ms
MDC IDC MSMT LEADCHNL RV SENSING INTR AMPL: 8 mV
MDC IDC SET LEADCHNL RV PACING AMPLITUDE: 2 V
MDC IDC SET LEADCHNL RV PACING PULSEWIDTH: 0.4 ms
MDC IDC STAT BRADY AP VP PERCENT: 0 %
MDC IDC STAT BRADY AP VS PERCENT: 0 %
MDC IDC STAT BRADY AS VS PERCENT: 81.02 %
MDC IDC STAT BRADY RA PERCENT PACED: 0 %
MDC IDC STAT BRADY RV PERCENT PACED: 18.98 %

## 2015-06-19 ENCOUNTER — Encounter: Payer: Self-pay | Admitting: Cardiology

## 2015-07-08 ENCOUNTER — Other Ambulatory Visit: Payer: Self-pay | Admitting: Cardiovascular Disease

## 2015-07-14 DIAGNOSIS — I429 Cardiomyopathy, unspecified: Secondary | ICD-10-CM | POA: Insufficient documentation

## 2015-07-27 ENCOUNTER — Telehealth: Payer: Self-pay | Admitting: Cardiovascular Disease

## 2015-07-27 NOTE — Telephone Encounter (Signed)
Pt wife calling stating that pt is having colonoscopy and pt would like Xarelto  Would like to know how long should he be off Please advise.

## 2015-07-27 NOTE — Telephone Encounter (Signed)
Skip one nights dose

## 2015-07-27 NOTE — Telephone Encounter (Signed)
S/w pt wife, Judeen Hammans, who reports pt scheduled for March 23 colonoscopy. Pt takes xarelto 20mg  and would like to know how many days prior to procedure this should be held. Will forward to Dr. Caryl Comes to advise. Pt agreeable w/plan.

## 2015-07-27 NOTE — Telephone Encounter (Signed)
Reviewed recommendations w/pt regarding holding xarelto. Pt verbalized understanding.

## 2015-07-30 ENCOUNTER — Ambulatory Visit
Admission: RE | Admit: 2015-07-30 | Discharge: 2015-07-30 | Disposition: A | Discharge status: Home / Self Care | Payer: Medicare PPO | Source: Ambulatory Visit | Attending: Gastroenterology | Admitting: Gastroenterology

## 2015-07-30 ENCOUNTER — Encounter: Payer: Self-pay | Admitting: *Deleted

## 2015-07-30 ENCOUNTER — Ambulatory Visit: Payer: Medicare PPO | Admitting: Anesthesiology

## 2015-07-30 ENCOUNTER — Encounter
Admission: RE | Disposition: A | Discharge status: Home / Self Care | Payer: Self-pay | Source: Ambulatory Visit | Attending: Gastroenterology

## 2015-07-30 DIAGNOSIS — I5022 Chronic systolic (congestive) heart failure: Secondary | ICD-10-CM | POA: Insufficient documentation

## 2015-07-30 DIAGNOSIS — I481 Persistent atrial fibrillation: Secondary | ICD-10-CM | POA: Insufficient documentation

## 2015-07-30 DIAGNOSIS — K5730 Diverticulosis of large intestine without perforation or abscess without bleeding: Secondary | ICD-10-CM | POA: Diagnosis not present

## 2015-07-30 DIAGNOSIS — Z7901 Long term (current) use of anticoagulants: Secondary | ICD-10-CM | POA: Diagnosis not present

## 2015-07-30 DIAGNOSIS — D122 Benign neoplasm of ascending colon: Secondary | ICD-10-CM | POA: Insufficient documentation

## 2015-07-30 DIAGNOSIS — D120 Benign neoplasm of cecum: Secondary | ICD-10-CM | POA: Diagnosis not present

## 2015-07-30 DIAGNOSIS — M1990 Unspecified osteoarthritis, unspecified site: Secondary | ICD-10-CM | POA: Insufficient documentation

## 2015-07-30 DIAGNOSIS — Z1211 Encounter for screening for malignant neoplasm of colon: Secondary | ICD-10-CM | POA: Insufficient documentation

## 2015-07-30 DIAGNOSIS — D123 Benign neoplasm of transverse colon: Secondary | ICD-10-CM | POA: Insufficient documentation

## 2015-07-30 DIAGNOSIS — I482 Chronic atrial fibrillation: Secondary | ICD-10-CM | POA: Insufficient documentation

## 2015-07-30 DIAGNOSIS — I429 Cardiomyopathy, unspecified: Secondary | ICD-10-CM | POA: Insufficient documentation

## 2015-07-30 DIAGNOSIS — E785 Hyperlipidemia, unspecified: Secondary | ICD-10-CM | POA: Diagnosis not present

## 2015-07-30 DIAGNOSIS — Z95810 Presence of automatic (implantable) cardiac defibrillator: Secondary | ICD-10-CM | POA: Diagnosis not present

## 2015-07-30 DIAGNOSIS — Z79899 Other long term (current) drug therapy: Secondary | ICD-10-CM | POA: Diagnosis not present

## 2015-07-30 DIAGNOSIS — I2510 Atherosclerotic heart disease of native coronary artery without angina pectoris: Secondary | ICD-10-CM | POA: Insufficient documentation

## 2015-07-30 DIAGNOSIS — I4891 Unspecified atrial fibrillation: Secondary | ICD-10-CM | POA: Diagnosis not present

## 2015-07-30 DIAGNOSIS — Z9049 Acquired absence of other specified parts of digestive tract: Secondary | ICD-10-CM | POA: Diagnosis not present

## 2015-07-30 DIAGNOSIS — K219 Gastro-esophageal reflux disease without esophagitis: Secondary | ICD-10-CM | POA: Insufficient documentation

## 2015-07-30 DIAGNOSIS — G4733 Obstructive sleep apnea (adult) (pediatric): Secondary | ICD-10-CM | POA: Diagnosis not present

## 2015-07-30 DIAGNOSIS — M5136 Other intervertebral disc degeneration, lumbar region: Secondary | ICD-10-CM | POA: Diagnosis not present

## 2015-07-30 DIAGNOSIS — I10 Essential (primary) hypertension: Secondary | ICD-10-CM | POA: Diagnosis not present

## 2015-07-30 HISTORY — PX: COLONOSCOPY WITH PROPOFOL: SHX5780

## 2015-07-30 LAB — HM COLONOSCOPY

## 2015-07-30 SURGERY — COLONOSCOPY WITH PROPOFOL
Anesthesia: General

## 2015-07-30 MED ORDER — FENTANYL CITRATE (PF) 100 MCG/2ML IJ SOLN
INTRAMUSCULAR | Status: DC | PRN
  Administered 2015-07-30: 50 ug via INTRAVENOUS

## 2015-07-30 MED ORDER — PROPOFOL 500 MG/50ML IV EMUL
INTRAVENOUS | Status: DC | PRN
  Administered 2015-07-30: 180 ug/kg/min via INTRAVENOUS

## 2015-07-30 MED ORDER — SODIUM CHLORIDE 0.9 % IV SOLN
INTRAVENOUS | Status: DC
  Administered 2015-07-30 (×2): via INTRAVENOUS

## 2015-07-30 MED ORDER — PROPOFOL 10 MG/ML IV BOLUS
INTRAVENOUS | Status: DC | PRN
  Administered 2015-07-30: 60 mg via INTRAVENOUS

## 2015-07-30 MED ORDER — EPHEDRINE SULFATE 50 MG/ML IJ SOLN
INTRAMUSCULAR | Status: DC | PRN
  Administered 2015-07-30: 15 mg via INTRAVENOUS

## 2015-07-30 MED ORDER — MIDAZOLAM HCL 5 MG/5ML IJ SOLN
INTRAMUSCULAR | Status: DC | PRN
  Administered 2015-07-30: 1 mg via INTRAVENOUS

## 2015-07-30 MED ORDER — PHENYLEPHRINE HCL 10 MG/ML IJ SOLN
INTRAMUSCULAR | Status: DC | PRN
  Administered 2015-07-30: 200 ug via INTRAVENOUS
  Administered 2015-07-30 (×2): 100 ug via INTRAVENOUS

## 2015-07-30 MED ORDER — LIDOCAINE HCL (CARDIAC) 20 MG/ML IV SOLN
INTRAVENOUS | Status: DC | PRN
  Administered 2015-07-30: 60 mg via INTRAVENOUS

## 2015-07-30 NOTE — Anesthesia Postprocedure Evaluation (Signed)
Anesthesia Post Note  Patient: Chris Price  Procedure(s) Performed: Procedure(s) (LRB): COLONOSCOPY WITH PROPOFOL (N/A)  Patient location during evaluation: PACU Anesthesia Type: General Level of consciousness: awake and alert Pain management: pain level controlled Vital Signs Assessment: post-procedure vital signs reviewed and stable Respiratory status: spontaneous breathing and respiratory function stable Cardiovascular status: stable Anesthetic complications: no    Last Vitals:  Filed Vitals:   07/30/15 1135 07/30/15 1145  BP: 127/81 108/73  Pulse: 69 69  Temp:    Resp: 15 17    Last Pain: There were no vitals filed for this visit.               Renard Caperton K

## 2015-07-30 NOTE — Anesthesia Procedure Notes (Signed)
Date/Time: 07/30/2015 10:37 AM Performed by: Allean Found Pre-anesthesia Checklist: Patient identified, Emergency Drugs available, Suction available, Patient being monitored and Timeout performed Oxygen Delivery Method: Nasal cannula

## 2015-07-30 NOTE — Anesthesia Preprocedure Evaluation (Signed)
Anesthesia Evaluation  Patient identified by MRN, date of birth, ID band Patient awake    Reviewed: Allergy & Precautions, NPO status , Patient's Chart, lab work & pertinent test results  History of Anesthesia Complications Negative for: history of anesthetic complications  Airway Mallampati: I       Dental   Pulmonary neg pulmonary ROS, sleep apnea and Continuous Positive Airway Pressure Ventilation ,           Cardiovascular hypertension, Pt. on medications + CAD and +CHF  + dysrhythmias Atrial Fibrillation + Cardiac Defibrillator  - Diastolic murmurs    Neuro/Psych negative neurological ROS     GI/Hepatic negative GI ROS, Neg liver ROS, GERD  Medicated,  Endo/Other  negative endocrine ROS  Renal/GU negative Renal ROS     Musculoskeletal  (+) Arthritis , Osteoarthritis,    Abdominal   Peds  Hematology   Anesthesia Other Findings   Reproductive/Obstetrics                             Anesthesia Physical Anesthesia Plan  ASA: III  Anesthesia Plan: General   Post-op Pain Management:    Induction: Intravenous  Airway Management Planned: Nasal Cannula  Additional Equipment:   Intra-op Plan:   Post-operative Plan:   Informed Consent: I have reviewed the patients History and Physical, chart, labs and discussed the procedure including the risks, benefits and alternatives for the proposed anesthesia with the patient or authorized representative who has indicated his/her understanding and acceptance.     Plan Discussed with:   Anesthesia Plan Comments:         Anesthesia Quick Evaluation

## 2015-07-30 NOTE — H&P (Signed)
Outpatient short stay form Pre-procedure 07/30/2015 9:02 AM Lollie Sails MD  Primary Physician: Dr. Deborra Medina  Reason for visit:  Colonoscopy  History of present illness:  Patient is a 75 year old male presenting today for colonoscopy. He tolerated his prep well. He does take xarelto but took the last time it the night before last. He is been off for at least 36 hours. Takes no other anticoagulation medications or aspirin products.    Current facility-administered medications:  .  0.9 %  sodium chloride infusion, , Intravenous, Continuous, Lollie Sails, MD  Prescriptions prior to admission  Medication Sig Dispense Refill Last Dose  . atorvastatin (LIPITOR) 80 MG tablet TAKE 1 TABLET EVERY DAY 90 tablet 3 Taking  . carvedilol (COREG) 12.5 MG tablet Take 1 tablet (12.5 mg total) by mouth 2 (two) times daily with a meal. 180 tablet 3 Taking  . finasteride (PROSCAR) 5 MG tablet Take 5 mg by mouth daily.   Taking  . furosemide (LASIX) 20 MG tablet TAKE 1 TABLET EVERY DAY 90 tablet 3 Taking  . lisinopril (PRINIVIL,ZESTRIL) 5 MG tablet Take 5 mg by mouth daily.   Taking  . loperamide (IMODIUM A-D) 2 MG tablet Take 2 mg by mouth as needed for diarrhea or loose stools.   Taking  . nitroGLYCERIN (NITROSTAT) 0.4 MG SL tablet Place 0.4 mg under the tongue every 5 (five) minutes as needed for chest pain.   Taking  . omeprazole (PRILOSEC) 20 MG capsule TAKE 1 CAPSULE 2 TIMES DAILY BEFORE A MEAL. FOR ACID REFLUX. 180 capsule 1   . rivaroxaban (XARELTO) 20 MG TABS tablet Take 1 tablet (20 mg total) by mouth daily with supper. 90 tablet 3 Taking  . spironolactone (ALDACTONE) 25 MG tablet TAKE 1/2 TABLET EVERY DAY 45 tablet 3 Taking  . Tamsulosin HCl (FLOMAX) 0.4 MG CAPS Take 0.4 mg by mouth daily.   Taking     No Known Allergies   Past Medical History  Diagnosis Date  . Lumbar spinal stenosis   . Degenerative disk disease     lumbar  . Disc herniation   . Hyperlipidemia   .  Cardiomyopathy -nonischemic     moderate to severe EF 25-30% March 2013  . Chronic systolic heart failure ([place]) 08/25/2011  . ICD (implantable cardiac defibrillator) in place   . CHF (congestive heart failure) ([place])   . Sleep apnea     uses cpap  . GERD (gastroesophageal reflux disease)   . Neuromuscular disorder ([place])     neauropathy   / siatica  . Hypertension   . Atrial fibrillation ([place])   . IVCD (intraventricular conduction defect)     Nonspecific QRS duration 122  . Persistent atrial fibrillation ([place]) 11/07/2011  . Coronary artery disease 08/15/2011    Moderate nonobstructive CAD with 60% mid LAD, 60% proximal RCA and 30 % stenossis left circumflex. Cardiac catheterization in March of 2015 showed no significant change     Review of systems:      Physical Exam    Heart and lungs: Regular rate and rhythm without rub or gallop, lungs are bilaterally clear.    HEENT: Norm cephalic atraumatic eyes are anicteric    Other:     Pertinant exam for procedure: Soft nontender nondistended bowel sounds positive normoactive.    Planned proceedures: Colonoscopy and indicated procedures. I have discussed the risks benefits and complications of procedures to include not limited to bleeding, infection, perforation and the risk of sedation and the  patient wishes to proceed.    Lollie Sails, MD Gastroenterology 07/30/2015  9:02 AM

## 2015-07-30 NOTE — Transfer of Care (Signed)
Immediate Anesthesia Transfer of Care Note  Patient: Chris Price  Procedure(s) Performed: Procedure(s): COLONOSCOPY WITH PROPOFOL (N/A)  Patient Location: PACU  Anesthesia Type:General  Level of Consciousness: sedated  Airway & Oxygen Therapy: Patient Spontanous Breathing and Patient connected to nasal cannula oxygen  Post-op Assessment: Report given to RN and Post -op Vital signs reviewed and stable  Post vital signs: Reviewed and stable  Last Vitals:  Filed Vitals:   07/30/15 0847  BP: 129/88  Pulse: 81  Temp: 36.8 C  Resp: 18    Complications: No apparent anesthesia complications

## 2015-07-30 NOTE — Op Note (Signed)
[facility] Gastroenterology Patient Name: Chris Price Procedure Date: 07/30/2015 10:21 AM MRN: [id number] Account #: [id number] Date of Birth: 1941-03-12 Admit Type: Outpatient Age: 75 Room: [facility] ENDO ROOM 3 Gender: Male Note Status: Finalized Procedure:            Colonoscopy Indications:          Personal history of colonic polyps Providers:            Lollie Sails, MD Referring MD:         Deborra Medina, MD (Referring MD) Medicines:            Monitored Anesthesia Care Complications:        No immediate complications. Procedure:            Pre-Anesthesia Assessment:                       - ASA Grade Assessment: III - A patient with severe                        systemic disease.                       After obtaining informed consent, the colonoscope was                        passed under direct vision. Throughout the procedure,                        the patient's blood pressure, pulse, and oxygen                        saturations were monitored continuously. The                        Colonoscope was introduced through the anus and                        advanced to the the cecum, identified by appendiceal                        orifice and ileocecal valve. The colonoscopy was                        performed without difficulty. The patient tolerated the                        procedure well. The quality of the bowel preparation                        was good. Findings:      Multiple medium-mouthed diverticula were found in the sigmoid colon and       descending colon.      A 12 mm polyp was found in the cecum. The polyp was sessile. The polyp       was removed with a cold snare. Resection and retrieval were complete. To       prevent bleeding after the polypectomy, two hemostatic clips were       successfully placed. There was no bleeding at the end of the maneuver.      A 6 mm polyp was found in the distal ascending colon. The  polyp was   semi-pedunculated. The polyp was removed with a cold snare. Resection       and retrieval were complete. To prevent bleeding after the polypectomy,       one hemostatic clip was successfully placed. There was no bleeding at       the end of the maneuver.      A 2 mm polyp was found in the proximal transverse colon. The polyp was       flat. The polyp was removed with a cold biopsy forceps. Resection and       retrieval were complete.      The retroflexed view of the distal rectum and anal verge was normal and       showed no anal or rectal abnormalities. Impression:           - Diverticulosis in the sigmoid colon and in the                        descending colon.                       - One 12 mm polyp in the cecum, removed with a cold                        snare. Resected and retrieved. Clips were placed.                       - One 6 mm polyp in the distal ascending colon, removed                        with a cold snare. Resected and retrieved. Clip was                        placed.                       - One 2 mm polyp in the proximal transverse colon,                        removed with a cold biopsy forceps. Resected and                        retrieved.                       - The distal rectum and anal verge are normal on                        retroflexion view. Recommendation:       - Discharge patient to home.                       - Resume Xarelto (rivaroxaban) at prior dose tomorrow.                        Refer to managing physician for further adjustment of                        therapy. Procedure Code(s):    --- Professional ---  45385, Colonoscopy, flexible; with removal of tumor(s),                        polyp(s), or other lesion(s) by snare technique                       45380, 59, Colonoscopy, flexible; with biopsy, single                        or multiple Diagnosis Code(s):    --- Professional ---                       D12.0, Benign  neoplasm of cecum                       D12.2, Benign neoplasm of ascending colon                       D12.3, Benign neoplasm of transverse colon (hepatic                        flexure or splenic flexure)                       Z86.010, Personal history of colonic polyps                       K57.30, Diverticulosis of large intestine without                        perforation or abscess without bleeding CPT copyright 2016 American Medical Association. All rights reserved. The codes documented in this report are preliminary and upon coder review may  be revised to meet current compliance requirements. Lollie Sails, MD 07/30/2015 11:13:36 AM This report has been signed electronically. Number of Addenda: 0 Note Initiated On: 07/30/2015 10:21 AM Scope Withdrawal Time: 0 hours 24 minutes 14 seconds  Total Procedure Duration: 0 hours 36 minutes 23 seconds       [facility]

## 2015-07-31 LAB — SURGICAL PATHOLOGY

## 2015-08-24 ENCOUNTER — Telehealth: Payer: Self-pay | Admitting: Cardiovascular Disease

## 2015-08-24 NOTE — Telephone Encounter (Signed)
°*  STAT* If patient is at the pharmacy, call can be transferred to refill team.   1. Which medications need to be refilled? (please list name of each medication and dose if known) Lisinopril 10 mg   2. Which pharmacy/location (including street and city if local pharmacy) is medication to be sent to? [facility]  3. Do they need a 30 day or 90 day supply? 90 day

## 2015-08-25 ENCOUNTER — Other Ambulatory Visit: Payer: Self-pay | Admitting: *Deleted

## 2015-08-25 MED ORDER — LISINOPRIL 5 MG PO TABS: 5.0000 mg | ORAL_TABLET | Freq: Every day | ORAL | Status: DC

## 2015-09-01 ENCOUNTER — Ambulatory Visit (INDEPENDENT_AMBULATORY_CARE_PROVIDER_SITE_OTHER): Payer: Medicare PPO | Admitting: *Deleted

## 2015-09-01 DIAGNOSIS — I5022 Chronic systolic (congestive) heart failure: Secondary | ICD-10-CM | POA: Diagnosis not present

## 2015-09-01 DIAGNOSIS — I429 Cardiomyopathy, unspecified: Secondary | ICD-10-CM | POA: Diagnosis not present

## 2015-09-01 NOTE — Progress Notes (Signed)
Remote ICD transmission.   

## 2015-09-07 ENCOUNTER — Telehealth: Payer: Self-pay | Admitting: Internal Medicine

## 2015-09-07 DIAGNOSIS — D126 Benign neoplasm of colon, unspecified: Secondary | ICD-10-CM | POA: Insufficient documentation

## 2015-09-07 NOTE — Telephone Encounter (Signed)
Your recent colonoscopy reports has been received.  Follow up is needed in 3 years,  2020 bc you had a  A tubular adenoma

## 2015-09-07 NOTE — Telephone Encounter (Signed)
Spoke with patients wife and she stated that GI had went over results with patient.

## 2015-09-25 ENCOUNTER — Encounter: Payer: Self-pay | Admitting: Internal Medicine

## 2015-10-07 ENCOUNTER — Other Ambulatory Visit: Payer: Self-pay | Admitting: Cardiovascular Disease

## 2015-10-08 LAB — CUP PACEART REMOTE DEVICE CHECK
Battery Voltage: 2.93 V
Brady Statistic AP VP Percent: 0 %
Date Time Interrogation Session: 20170425052308
HighPow Impedance: 418 Ohm
HighPow Impedance: 72 Ohm
Implantable Lead Implant Date: 20130502
Implantable Lead Implant Date: 20130502
Implantable Lead Location: 753860
Implantable Lead Model: 5076
Lead Channel Impedance Value: 456 Ohm
Lead Channel Pacing Threshold Amplitude: 0.625 V
Lead Channel Pacing Threshold Amplitude: 1.25 V
Lead Channel Pacing Threshold Pulse Width: 0.4 ms
Lead Channel Sensing Intrinsic Amplitude: 0.875 mV
Lead Channel Sensing Intrinsic Amplitude: 8 mV
Lead Channel Sensing Intrinsic Amplitude: 8 mV
Lead Channel Setting Pacing Amplitude: 2 V
Lead Channel Setting Pacing Pulse Width: 0.4 ms
Lead Channel Setting Sensing Sensitivity: 0.3 mV
MDC IDC LEAD LOCATION: 753859
MDC IDC LEAD MODEL: 6935
MDC IDC MSMT LEADCHNL RA PACING THRESHOLD PULSEWIDTH: 0.4 ms
MDC IDC MSMT LEADCHNL RA SENSING INTR AMPL: 0.875 mV
MDC IDC MSMT LEADCHNL RV IMPEDANCE VALUE: 608 Ohm
MDC IDC STAT BRADY AP VS PERCENT: 0 %
MDC IDC STAT BRADY AS VP PERCENT: 18.02 %
MDC IDC STAT BRADY AS VS PERCENT: 81.98 %
MDC IDC STAT BRADY RA PERCENT PACED: 0 %
MDC IDC STAT BRADY RV PERCENT PACED: 18.02 %

## 2015-10-09 ENCOUNTER — Encounter: Payer: Self-pay | Admitting: Cardiology

## 2015-11-06 ENCOUNTER — Other Ambulatory Visit: Payer: Self-pay | Admitting: Cardiovascular Disease

## 2015-12-01 ENCOUNTER — Ambulatory Visit (INDEPENDENT_AMBULATORY_CARE_PROVIDER_SITE_OTHER): Payer: Medicare PPO | Admitting: *Deleted

## 2015-12-01 DIAGNOSIS — I429 Cardiomyopathy, unspecified: Secondary | ICD-10-CM | POA: Diagnosis not present

## 2015-12-02 ENCOUNTER — Encounter: Payer: Self-pay | Admitting: Cardiology

## 2015-12-02 NOTE — Progress Notes (Signed)
Remote ICD transmission.   

## 2015-12-03 LAB — CUP PACEART REMOTE DEVICE CHECK
Battery Voltage: 2.87 V
Brady Statistic AP VP Percent: 0 %
Brady Statistic AP VS Percent: 0 %
Brady Statistic AS VP Percent: 16.14 %
Brady Statistic RA Percent Paced: 0 %
Date Time Interrogation Session: 20170725073428
HIGH POWER IMPEDANCE MEASURED VALUE: 72 Ohm
HighPow Impedance: 399 Ohm
Implantable Lead Implant Date: 20130502
Implantable Lead Location: 753860
Implantable Lead Model: 5076
Implantable Lead Model: 6935
Lead Channel Impedance Value: 456 Ohm
Lead Channel Pacing Threshold Amplitude: 0.625 V
Lead Channel Pacing Threshold Amplitude: 1.25 V
Lead Channel Pacing Threshold Pulse Width: 0.4 ms
Lead Channel Sensing Intrinsic Amplitude: 7.5 mV
Lead Channel Sensing Intrinsic Amplitude: 7.5 mV
Lead Channel Setting Pacing Pulse Width: 0.4 ms
Lead Channel Setting Sensing Sensitivity: 0.3 mV
MDC IDC LEAD IMPLANT DT: 20130502
MDC IDC LEAD LOCATION: 753859
MDC IDC MSMT LEADCHNL RA SENSING INTR AMPL: 0.625 mV
MDC IDC MSMT LEADCHNL RA SENSING INTR AMPL: 0.625 mV
MDC IDC MSMT LEADCHNL RV IMPEDANCE VALUE: 551 Ohm
MDC IDC MSMT LEADCHNL RV PACING THRESHOLD PULSEWIDTH: 0.4 ms
MDC IDC SET LEADCHNL RV PACING AMPLITUDE: 2 V
MDC IDC STAT BRADY AS VS PERCENT: 83.86 %
MDC IDC STAT BRADY RV PERCENT PACED: 16.14 %

## 2015-12-15 ENCOUNTER — Other Ambulatory Visit: Payer: Self-pay | Admitting: Cardiovascular Disease

## 2016-01-13 ENCOUNTER — Other Ambulatory Visit: Payer: Self-pay | Admitting: Cardiovascular Disease

## 2016-03-03 ENCOUNTER — Encounter: Payer: Self-pay | Admitting: Internal Medicine

## 2016-03-15 ENCOUNTER — Encounter: Payer: Medicare PPO | Admitting: Internal Medicine

## 2016-03-16 ENCOUNTER — Encounter: Payer: Self-pay | Admitting: Internal Medicine

## 2016-03-16 ENCOUNTER — Ambulatory Visit (INDEPENDENT_AMBULATORY_CARE_PROVIDER_SITE_OTHER): Payer: Medicare PPO | Admitting: Internal Medicine

## 2016-03-16 ENCOUNTER — Telehealth: Payer: Self-pay | Admitting: Internal Medicine

## 2016-03-16 VITALS — BP 126/72 | HR 59 | Ht 66.0 in | Wt 212.0 lb

## 2016-03-16 DIAGNOSIS — I480 Paroxysmal atrial fibrillation: Secondary | ICD-10-CM | POA: Diagnosis not present

## 2016-03-16 DIAGNOSIS — I5022 Chronic systolic (congestive) heart failure: Secondary | ICD-10-CM | POA: Diagnosis not present

## 2016-03-16 DIAGNOSIS — I428 Other cardiomyopathies: Secondary | ICD-10-CM | POA: Diagnosis not present

## 2016-03-16 LAB — CUP PACEART INCLINIC DEVICE CHECK
Battery Voltage: 2.76 V
Brady Statistic AS VS Percent: 81.44 %
Brady Statistic RA Percent Paced: 0 %
Date Time Interrogation Session: 20171108172650
HighPow Impedance: 418 Ohm
HighPow Impedance: 75 Ohm
Implantable Lead Implant Date: 20130502
Implantable Lead Implant Date: 20130502
Implantable Lead Location: 753859
Implantable Lead Model: 6935
Implantable Pulse Generator Implant Date: 20130502
Lead Channel Impedance Value: 456 Ohm
Lead Channel Pacing Threshold Amplitude: 1.25 V
Lead Channel Pacing Threshold Pulse Width: 0.4 ms
Lead Channel Pacing Threshold Pulse Width: 0.4 ms
Lead Channel Sensing Intrinsic Amplitude: 0.5 mV
Lead Channel Sensing Intrinsic Amplitude: 0.875 mV
Lead Channel Setting Pacing Amplitude: 2 V
Lead Channel Setting Pacing Pulse Width: 0.4 ms
MDC IDC LEAD LOCATION: 753860
MDC IDC MSMT LEADCHNL RV IMPEDANCE VALUE: 646 Ohm
MDC IDC MSMT LEADCHNL RV PACING THRESHOLD AMPLITUDE: 0.625 V
MDC IDC MSMT LEADCHNL RV SENSING INTR AMPL: 4.75 mV
MDC IDC MSMT LEADCHNL RV SENSING INTR AMPL: 7.875 mV
MDC IDC SET LEADCHNL RV SENSING SENSITIVITY: 0.3 mV
MDC IDC STAT BRADY AP VP PERCENT: 0 %
MDC IDC STAT BRADY AP VS PERCENT: 0 %
MDC IDC STAT BRADY AS VP PERCENT: 18.56 %
MDC IDC STAT BRADY RV PERCENT PACED: 23.7 %

## 2016-03-16 MED ORDER — SPIRONOLACTONE 25 MG PO TABS: 12.5000 mg | ORAL_TABLET | Freq: Every day | ORAL | 3 refills | Status: DC

## 2016-03-16 MED ORDER — OMEPRAZOLE 20 MG PO CPDR: DELAYED_RELEASE_CAPSULE | ORAL | 5 refills | Status: DC

## 2016-03-16 MED ORDER — ATORVASTATIN CALCIUM 80 MG PO TABS: ORAL_TABLET | ORAL | 3 refills | Status: DC

## 2016-03-16 MED ORDER — FUROSEMIDE 20 MG PO TABS: 20.0000 mg | ORAL_TABLET | Freq: Every day | ORAL | 3 refills | Status: DC

## 2016-03-16 MED ORDER — CARVEDILOL 12.5 MG PO TABS: 12.5000 mg | ORAL_TABLET | Freq: Two times a day (BID) | ORAL | 3 refills | Status: DC

## 2016-03-16 MED ORDER — RIVAROXABAN 20 MG PO TABS: 20.0000 mg | ORAL_TABLET | Freq: Every day | ORAL | 3 refills | Status: DC

## 2016-03-16 MED ORDER — LISINOPRIL 5 MG PO TABS: 5.0000 mg | ORAL_TABLET | Freq: Every day | ORAL | 3 refills | Status: DC

## 2016-03-16 MED ORDER — NITROGLYCERIN 0.4 MG SL SUBL: 0.4000 mg | SUBLINGUAL_TABLET | SUBLINGUAL | 3 refills | Status: DC | PRN

## 2016-03-16 NOTE — Patient Instructions (Addendum)
Medication Instructions: - Your physician recommends that you continue on your current medications as directed. Please refer to the Current Medication list given to you today.  Labwork: - Your physician recommends that you return for lab work: 11/9- at 8:00 am   Procedures/Testing: - none ordered   Follow-Up: - Your physician wants you to follow-up in: 1 year with Dr. Caryl Comes. You will receive a reminder letter in the mail two months in advance. If you don't receive a letter, please call our office to schedule the follow-up appointment.  Any Additional Special Instructions Will Be Listed Below (If Applicable).     If you need a refill on your cardiac medications before your next appointment, please call your pharmacy.

## 2016-03-16 NOTE — Progress Notes (Signed)
Patient Care Team: Crecencio Mc, MD as PCP - General (Internal Medicine)   HPI  Chris Price is a 75 y.o. male Seen in followup for an ICD implanted for primary prevention in nonischemic cardiomyopathy further complicated by sinus bradycardia-date of implant May 2013. He also has a history of atrial fibrillation for which he was managed with dabigitran and amiodarone. He developed a significant intention tremor prompting the down titration and subsequent discontinuation of his amiodarone  Catheterization 3/15 nonobstructive coronary disease ejection fraction 35%  There have been no appreciable change in his tremor.  The patient denies chest pain, shortness of breath, nocturnal dyspnea, orthopnea or peripheral edema.  There have been no palpitations, lightheadedness or syncope.    his biggest complaint is left hip pain    6'/17 Cr  0.9  K  4.5  Past Medical History:  Diagnosis Date  . Atrial fibrillation ([place])   . Cardiomyopathy -nonischemic    moderate to severe EF 25-30% March 2013  . CHF (congestive heart failure) ([place])   . Chronic systolic heart failure ([place]) 08/25/2011  . Coronary artery disease 08/15/2011   Moderate nonobstructive CAD with 60% mid LAD, 60% proximal RCA and 30 % stenossis left circumflex. Cardiac catheterization in March of 2015 showed no significant change   . Degenerative disk disease    lumbar  . Disc herniation   . GERD (gastroesophageal reflux disease)   . Hyperlipidemia   . Hypertension   . ICD (implantable cardiac defibrillator) in place   . IVCD (intraventricular conduction defect)    Nonspecific QRS duration 122  . Lumbar spinal stenosis   . Neuromuscular disorder ([place])    neauropathy   / siatica  . Persistent atrial fibrillation ([place]) 11/07/2011  . Sleep apnea    uses cpap    Past Surgical History:  Procedure Laterality Date  . BACK SURGERY    . CARD[facility]AC [facility]IZATION  [facility]   . CARDIAC CATHETERIZATION  07/09/2013   [facility]  .  CARDIOVERSION  08/2011   [facility]  . CHOLECYSTECTOMY  April 2015  . COLONOSCOPY WITH PROPOFOL N/A 07/30/2015   Procedure: COLONOSCOPY WITH PROPOFOL;  Surgeon: Lollie Sails, MD;  Location: [facility] ENDOSCOPY;  Service: Endoscopy;  Laterality: N/A;  . cooled thermotherapy  [id number]   [place], complicated by incontinence  . HERNIA REPAIR    . ICD  09/08/2011  . IMPLANTABLE CARDIOVERTER DEFIBRILLATOR IMPLANT  09/08/2011   Procedure: IMPLANTABLE CARDIOVERTER DEFIBRILLATOR IMPLANT;  Surgeon: [name], MD;  Location[facility] CATH LAB;  Service: Cardiovascular;;  . LUMBAR LAMINECTOMY     L2 through S1 with wide decompression of the thecal sac and nerve roots.  Marland Kitchen PROSTATE BIOPSY  2013    Current Outpatient Prescriptions  Medication Sig Dispense Refill  . atorvastatin (LIPITOR) 80 MG tablet TAKE 1 TABLET EVERY DAY (CONTACT OFFICE TO SCHEDULE FUTURE APPOINTMENT AND [phone]) 90 tablet 3  . carvedilol (COREG) 12.5 MG tablet Take 1 tablet (12.5 mg total) by mouth 2 (two) times daily with a meal. 180 tablet 3  . finasteride (PROSCAR) 5 MG tablet Take 5 mg by mouth daily.    . furosemide (LASIX) 20 MG tablet TAKE 1 TABLET EVERY DAY 90 tablet 3  . lisinopril (PRINIVIL,ZESTRIL) 5 MG tablet Take 1 tablet (5 mg total) by mouth daily. 90 tablet 3  . loperamide (IMODIUM A-D) 2 MG tablet Take 2 mg by mouth as needed for diarrhea or loose stools.    . nitroGLYCERIN (NITROSTAT)  0.4 MG SL tablet Place 0.4 mg under the tongue every 5 (five) minutes as needed for chest pain.    Marland Kitchen omeprazole (PRILOSEC) 20 MG capsule TAKE 1 CAPSULE 2 TIMES DAILY BEFORE A MEAL. FOR ACID REFLUX. 180 capsule 1  . rivaroxaban (XARELTO) 20 MG TABS tablet Take 1 tablet (20 mg total) by mouth daily with supper. 90 tablet 3  . spironolactone (ALDACTONE) 25 MG tablet TAKE 1/2 TABLET EVERY DAY 45 tablet 1  . Tamsulosin HCl (FLOMAX) 0.4 MG CAPS Take 0.4 mg by mouth daily.     No current facility-administered medications for this visit.      No Known Allergies  Review of Systems negative except from HPI and PMH  Physical Exam BP 126/72 (BP Location: Left Arm, Patient Position: Sitting, Cuff Size: Large)   Pulse (!) 59   Ht 5\' 6"  (1.676 m)   Wt 212 lb (96.2 kg)   SpO2 96%   BMI 34.22 kg/m  Well developed and well nourished in no acute distress HENT normal E scleral and icterus clear Neck Supple JVP flat; carotids brisk and full Clear to ausculation  Device pocket well healed; without hematoma or erythema.  There is no tethering  Irregularly irregular Regular rate and rhythm, no murmurs gallops or rub Soft with active bowel sounds No clubbing cyanosis none Edema Alert and oriented, grossly normal motor and sensory function; mild tremor Skin Warm and Dry  ECG atrial fibrillation 85 Intervals-/13/40 Axis -77    Assessment and  Plan  NICM  Afib-permanent  HTN  High Risk Medication Surveillance  ICD-MDT The patient's device was interrogated.  The information was reviewed. No changes were made in the programming.    Labs ok 6/15   Euvolemic  BP well controlled   Will check survieillance labs K and MG and CBC   Continue anticoagulation tolerating well

## 2016-03-16 NOTE — Telephone Encounter (Signed)
Pt seeing Dr. Caryl Comes today in [place]. Will come by [place] office for samples.  Medication Samples have been provided to the patient.  Drug name: Xarelto       Strength: 20mg         Qty: 3 bottles (21 tablets) LOT: [id number]  Exp.Date: 2/20  [place]iana Shore 5:13 PM 03/16/2016

## 2016-03-17 ENCOUNTER — Ambulatory Visit
Admission: RE | Admit: 2016-03-17 | Discharge: 2016-03-17 | Disposition: A | Discharge status: Home / Self Care | Payer: Medicare PPO | Source: Ambulatory Visit | Attending: Family Medicine | Admitting: Family Medicine

## 2016-03-17 ENCOUNTER — Other Ambulatory Visit: Payer: Self-pay | Admitting: Family Medicine

## 2016-03-17 ENCOUNTER — Other Ambulatory Visit: Payer: Medicare PPO

## 2016-03-17 DIAGNOSIS — M25559 Pain in unspecified hip: Secondary | ICD-10-CM | POA: Diagnosis present

## 2016-03-17 DIAGNOSIS — R52 Pain, unspecified: Secondary | ICD-10-CM

## 2016-03-17 DIAGNOSIS — I700 Atherosclerosis of aorta: Secondary | ICD-10-CM | POA: Insufficient documentation

## 2016-03-17 DIAGNOSIS — M545 Low back pain: Secondary | ICD-10-CM | POA: Insufficient documentation

## 2016-03-17 DIAGNOSIS — I428 Other cardiomyopathies: Secondary | ICD-10-CM

## 2016-03-17 DIAGNOSIS — M549 Dorsalgia, unspecified: Secondary | ICD-10-CM | POA: Diagnosis present

## 2016-03-17 DIAGNOSIS — M25552 Pain in left hip: Secondary | ICD-10-CM | POA: Insufficient documentation

## 2016-03-17 DIAGNOSIS — M1288 Other specific arthropathies, not elsewhere classified, other specified site: Secondary | ICD-10-CM | POA: Insufficient documentation

## 2016-03-17 DIAGNOSIS — I480 Paroxysmal atrial fibrillation: Secondary | ICD-10-CM

## 2016-03-18 LAB — CBC WITH DIFFERENTIAL/PLATELET
BASOS ABS: 0 10*3/uL (ref 0.0–0.2)
BASOS: 0 %
EOS (ABSOLUTE): 0.1 10*3/uL (ref 0.0–0.4)
Eos: 2 %
HEMOGLOBIN: 15.1 g/dL (ref 12.6–17.7)
Hematocrit: 44.3 % (ref 37.5–51.0)
IMMATURE GRANS (ABS): 0 10*3/uL (ref 0.0–0.1)
IMMATURE GRANULOCYTES: 0 %
LYMPHS: 29 %
Lymphocytes Absolute: 1.8 10*3/uL (ref 0.7–3.1)
MCH: 32.8 pg (ref 26.6–33.0)
MCHC: 34.1 g/dL (ref 31.5–35.7)
MCV: 96 fL (ref 79–97)
MONOCYTES: 11 %
Monocytes Absolute: 0.7 10*3/uL (ref 0.1–0.9)
NEUTROS ABS: 3.5 10*3/uL (ref 1.4–7.0)
Neutrophils: 58 %
Platelets: 179 10*3/uL (ref 150–379)
RBC: 4.6 x10E6/uL (ref 4.14–5.80)
RDW: 14.4 % (ref 12.3–15.4)
WBC: 6.1 10*3/uL (ref 3.4–10.8)

## 2016-03-18 LAB — BASIC METABOLIC PANEL
BUN/Creatinine Ratio: 17 (ref 10–24)
BUN: 17 mg/dL (ref 8–27)
CALCIUM: 8.9 mg/dL (ref 8.6–10.2)
CHLORIDE: 101 mmol/L (ref 96–106)
CO2: 23 mmol/L (ref 18–29)
Creatinine, Ser: 1 mg/dL (ref 0.76–1.27)
GFR, EST AFRICAN AMERICAN: 84 mL/min/{1.73_m2} (ref >59)
GFR, EST NON AFRICAN AMERICAN: 72 mL/min/{1.73_m2} (ref >59)
Glucose: 93 mg/dL (ref 65–99)
Potassium: 4.3 mmol/L (ref 3.5–5.2)
Sodium: 141 mmol/L (ref 134–144)

## 2016-05-12 ENCOUNTER — Ambulatory Visit (INDEPENDENT_AMBULATORY_CARE_PROVIDER_SITE_OTHER): Payer: Medicare PPO

## 2016-05-12 VITALS — BP 122/70 | HR 66 | Temp 97.8°F | Resp 14 | Ht 66.0 in | Wt 214.8 lb

## 2016-05-12 DIAGNOSIS — Z0000 Encounter for general adult medical examination without abnormal findings: Secondary | ICD-10-CM

## 2016-05-12 NOTE — Progress Notes (Signed)
Subjective:   Chris Price is a 76 y.o. male who presents for Medicare Annual/Subsequent preventive examination.  Review of Systems:  No ROS.  Medicare Wellness Visit.  Cardiac Risk Factors include: advanced age (>45men, >64 women);obesity (BMI >30kg/m2);male gender;hypertension     Objective:    Vitals: BP 122/70 (BP Location: Left Arm, Patient Position: Sitting, Cuff Size: Normal)   Pulse 66   Temp 97.8 F (36.6 C) (Oral)   Resp 14   Ht 5\' 6"  (1.676 m)   Wt 214 lb 12.8 oz (97.4 kg)   SpO2 95%   BMI 34.67 kg/m   Body mass index is 34.67 kg/m.  Tobacco History  Smoking Status  . Never Smoker  Smokeless Tobacco  . Never Used     Counseling given: Not Answered   Past Medical History:  Diagnosis Date  . Atrial fibrillation ([place])   . Cardiomyopathy -nonischemic    moderate to severe EF 25-30% March 2013  . CHF (congestive heart failure) ([place])   . Chronic systolic heart failure ([place]) 08/25/2011  . Coronary artery disease 08/15/2011   Moderate nonobstructive CAD with 60% mid LAD, 60% proximal RCA and 30 % stenossis left circumflex. Cardiac catheterization in March of 2015 showed no significant change   . Degenerative disk disease    lumbar  . Disc herniation   . GERD (gastroesophageal reflux disease)   . Hyperlipidemia   . Hypertension   . ICD (implantable cardiac defibrillator) in place   . IVCD (intraventricular conduction defect)    Nonspecific QRS duration 122  . Lumbar spinal stenosis   . Neuromuscular disorder ([place])    neauropathy   / siatica  . Persistent atrial fibrillation ([place]) 11/07/2011  . Sleep apnea    uses cpap   Past Surgical History:  Procedure Laterality Date  . BACK SURGERY    . [facility]  [facility]   . CARDIAC CAT[facility]IZATION  07/09/2013   [facility]  . CARDIOVERSION  08/2011   [facility]  . CHOLECYSTECTOMY  April 2015  . COLONOSCOPY WITH PROPOFOL N/A 07/30/2015   Procedure: COLONOSCOPY WITH PROPOFOL;  Surgeon: Lollie Sails, MD;  Location: [facility] ENDOSCOPY;  Service: Endoscopy;  Laterality: N/A;  . cooled thermotherapy  [id number]   [place], complicated by incontinence  . HERNIA REPAIR    . ICD  09/08/2011  . IMPLANTABLE CARDIOVERTER DEFIBRILLATOR IMPLANT  09/08/2011   Procedure: IMPLANTABLE CARDIOVERTER DEFIBRILLATOR IMPLANT;  Surgeon: [name], MD;  Location: [facility] CATH LAB;  Service: Cardiovascular;;  . LUMBAR LAMINECTOMY     L2 through S1 with wide decompression of the thecal sac and nerve roots.  Marland Kitchen PROSTATE BIOPSY  2013   Family History  Problem Relation Age of Onset  . Heart attack Father   . Heart attack Brother    History  Sexual Activity  . Sexual activity: Not Currently    Outpatient Encounter Prescriptions as of 05/12/2016  Medication Sig  . atorvastatin (LIPITOR) 80 MG tablet Take one tablet (80mg ) by mouth once daily  . carvedilol (COREG) 12.5 MG tablet Take 1 tablet (12.5 mg total) by mouth 2 (two) times daily with a meal.  . finasteride (PROSCAR) 5 MG tablet Take 5 mg by mouth daily.  . furosemide (LASIX) 20 MG tablet Take 1 tablet (20 mg total) by mouth daily.  Marland Kitchen lisinopril (PRINIVIL,ZESTRIL) 5 MG tablet Take 1 tablet (5 mg total) by mouth daily.  . nitroGLYCERIN (NITROSTAT) 0.4 MG SL tablet Place 1 tablet (0.4  mg total) under the tongue every 5 (five) minutes as needed for chest pain.  Marland Kitchen omeprazole (PRILOSEC) 20 MG capsule TAKE 1 CAPSULE 2 TIMES DAILY BEFORE A MEAL. FOR ACID REFLUX.  . rivaroxaban (XARELTO) 20 MG TABS tablet Take 1 tablet (20 mg total) by mouth daily with supper.  Marland Kitchen spironolactone (ALDACTONE) 25 MG tablet Take 0.5 tablets (12.5 mg total) by mouth daily.  . Tamsulosin HCl (FLOMAX) 0.4 MG CAPS Take 0.4 mg by mouth daily.  . traMADol (ULTRAM) 50 MG tablet Take 50 mg by mouth 3 (three) times daily as needed.  . traZODone (DESYREL) 50 MG tablet Take 50 mg by mouth at bedtime.  Marland Kitchen loperamide (IMODIUM A-D) 2 MG tablet Take 2 mg by mouth as needed for diarrhea or loose stools.    No facility-administered encounter medications on file as of 05/12/2016.     Activities of Daily Living In your present state of health, do you have any difficulty performing the following activities: 05/12/2016 05/13/2015  Hearing? Tempie Donning  Vision? N N  Difficulty concentrating or making decisions? Y N  Walking or climbing stairs? Y Y  Dressing or bathing? N N  Doing errands, shopping? N N  Preparing Food and eating ? N N  Using the Toilet? N N  In the past six months, have you accidently leaked urine? N N  Do you have problems with loss of bowel control? N N  Managing your Medications? N N  Managing your Finances? N N  Housekeeping or managing your Housekeeping? N N  Some recent data might be hidden    Patient Care Team: Crecencio Mc, MD as PCP - General (Internal Medicine)   Assessment:    This is a routine wellness examination for [place]. The goal of the wellness visit is to assist the patient how to close the gaps in care and create a preventative care plan for the patient.   Osteoporosis risk reviewed.  Medications reviewed; taking without issues or barriers.  Safety issues reviewed; lives with wife.  Smoke detectors in the home. No firearms in the home. Wears seatbelts when driving or riding with others. No violence in the home.  No identified risk were noted; The patient was oriented x 3; appropriate in dress and manner and no objective failures at ADL's or IADL's.   BMI; discussed the importance of a healthy diet, water intake and exercise. Educational material provided.  Health maintenance gaps; closed.  Patient Concerns: None at this time.  Follow up with PCP as needed.  Exercise Activities and Dietary recommendations Current Exercise Habits: Home exercise routine, Type of exercise: walking, Time (Minutes): 20, Intensity: Moderate  Goals    . Increase physical activity          Stretch!! Chair exercises as demonstrated, increase as tolerated. Silver  sneaker water aerobics 3 times weekly, 45-60 minutes with wife.      Fall Risk Fall Risk  05/12/2016 05/13/2015 02/27/2013  Falls in the past year? No No No   Depression Screen PHQ 2/9 Scores 05/12/2016 05/13/2015 02/27/2013  PHQ - 2 Score 0 0 0    Cognitive Function MMSE - Mini Mental State Exam 05/12/2016 05/13/2015  Orientation to time 5 5  Orientation to Place 5 5  Registration 3 3  Attention/ Calculation 5 5  Recall 3 3  Language- name 2 objects 2 2  Language- repeat 1 1  Language- follow 3 step command 3 3  Language- read & follow direction 1 1  Write a sentence 1 1  Copy design 1 1  Total score 30 30        Immunization History  Administered Date(s) Administered  . Influenza Split 02/19/2012  . Influenza-Unspecified 02/08/2013, 02/07/2015, 02/09/2016  . Pneumococcal Conjugate-13 05/14/2015  . Pneumococcal Polysaccharide-23 03/21/2012  . Tdap 02/28/2011   Screening Tests Health Maintenance  Topic Date Due  . ZOSTAVAX  01/22/1999  . TETANUS/TDAP  02/27/2021  . INFLUENZA VACCINE  Addressed  . PNA vac Low Risk Adult  Completed      Plan:    End of life planning; Advance aging; Advanced directives discussed. Copy of current HCPOA/Living Will on file.    Medicare Attestation I have personally reviewed: The patient's medical and social history Their use of alcohol, tobacco or illicit drugs Their current medications and supplements The patient's functional ability including ADLs,fall risks, home safety risks, cognitive, and hearing and visual impairment Diet and physical activities Evidence for depression   The patient's weight, height, BMI, and visual acuity have been recorded in the chart.  I have made referrals and provided education to the patient based on review of the above and I have provided the patient with a written personalized care plan for preventive services.    During the course of the visit the patient was educated and counseled about the following  appropriate screening and preventive services:   Vaccines to include Pneumoccal, Influenza, Hepatitis B, Td, Zostavax, HCV  Electrocardiogram  Cardiovascular Disease  Colorectal cancer screening  Diabetes screening  Prostate Cancer Screening  Glaucoma screening  Nutrition counseling   Smoking cessation counseling  Patient Instructions (the written plan) was given to the patient.    Varney Biles, LPN  [phone]

## 2016-05-12 NOTE — Patient Instructions (Addendum)
Chris Price , Thank you for taking time to come for your Medicare Wellness Visit. I appreciate your ongoing commitment to your health goals. Please review the following plan we discussed and let me know if I can assist you in the future.   Follow up with Dr. Derrel Nip as needed.  These are the goals we discussed: Goals    . Increase physical activity          Stretch!! Chair exercises as demonstrated, increase as tolerated. Silver sneaker water aerobics 3 times weekly, 45-60 minutes with wife.       This is a list of the screening recommended for you and due dates:  Health Maintenance  Topic Date Due  . Shingles Vaccine  01/22/1999  . Tetanus Vaccine  02/27/2021  . Flu Shot  Addressed  . Pneumonia vaccines  Completed      Fall Prevention in the Home Introduction Falls can cause injuries. They can happen to people of all ages. There are many things you can do to make your home safe and to help prevent falls. What can I do on the outside of my home?  Regularly fix the edges of walkways and driveways and fix any cracks.  Remove anything that might make you trip as you walk through a door, such as a raised step or threshold.  Trim any bushes or trees on the path to your home.  Use bright outdoor lighting.  Clear any walking paths of anything that might make someone trip, such as rocks or tools.  Regularly check to see if handrails are loose or broken. Make sure that both sides of any steps have handrails.  Any raised decks and porches should have guardrails on the edges.  Have any leaves, snow, or ice cleared regularly.  Use sand or salt on walking paths during winter.  Clean up any spills in your garage right away. This includes oil or grease spills. What can I do in the bathroom?  Use night lights.  Install grab bars by the toilet and in the tub and shower. Do not use towel bars as grab bars.  Use non-skid mats or decals in the tub or shower.  If you need to sit  down in the shower, use a plastic, non-slip stool.  Keep the floor dry. Clean up any water that spills on the floor as soon as it happens.  Remove soap buildup in the tub or shower regularly.  Attach bath mats securely with double-sided non-slip rug tape.  Do not have throw rugs and other things on the floor that can make you trip. What can I do in the bedroom?  Use night lights.  Make sure that you have a light by your bed that is easy to reach.  Do not use any sheets or blankets that are too big for your bed. They should not hang down onto the floor.  Have a firm chair that has side arms. You can use this for support while you get dressed.  Do not have throw rugs and other things on the floor that can make you trip. What can I do in the kitchen?  Clean up any spills right away.  Avoid walking on wet floors.  Keep items that you use a lot in easy-to-reach places.  If you need to reach something above you, use a strong step stool that has a grab bar.  Keep electrical cords out of the way.  Do not use floor polish or wax that makes  floors slippery. If you must use wax, use non-skid floor wax.  Do not have throw rugs and other things on the floor that can make you trip. What can I do with my stairs?  Do not leave any items on the stairs.  Make sure that there are handrails on both sides of the stairs and use them. Fix handrails that are broken or loose. Make sure that handrails are as long as the stairways.  Check any carpeting to make sure that it is firmly attached to the stairs. Fix any carpet that is loose or worn.  Avoid having throw rugs at the top or bottom of the stairs. If you do have throw rugs, attach them to the floor with carpet tape.  Make sure that you have a light switch at the top of the stairs and the bottom of the stairs. If you do not have them, ask someone to add them for you. What else can I do to help prevent falls?  Wear shoes that:  Do not have  high heels.  Have rubber bottoms.  Are comfortable and fit you well.  Are closed at the toe. Do not wear sandals.  If you use a stepladder:  Make sure that it is fully opened. Do not climb a closed stepladder.  Make sure that both sides of the stepladder are locked into place.  Ask someone to hold it for you, if possible.  Clearly mark and make sure that you can see:  Any grab bars or handrails.  First and last steps.  Where the edge of each step is.  Use tools that help you move around (mobility aids) if they are needed. These include:  Canes.  Walkers.  Scooters.  Crutches.  Turn on the lights when you go into a dark area. Replace any light bulbs as soon as they burn out.  Set up your furniture so you have a clear path. Avoid moving your furniture around.  If any of your floors are uneven, fix them.  If there are any pets around you, be aware of where they are.  Review your medicines with your doctor. Some medicines can make you feel dizzy. This can increase your chance of falling. Ask your doctor what other things that you can do to help prevent falls. This information is not intended to replace advice given to you by your health care provider. Make sure you discuss any questions you have with your health care provider. Document Released: 02/19/2009 Document Revised: 10/01/2015 Document Reviewed: 05/30/2014  2017 [employer]vier

## 2016-05-13 NOTE — Progress Notes (Signed)
Care was provided under my supervision. I agree with the management as indicated in the note.  Lavell Supple DO  

## 2016-05-20 ENCOUNTER — Telehealth: Payer: Self-pay | Admitting: Internal Medicine

## 2016-05-20 NOTE — Telephone Encounter (Signed)
Papules (spots) in his hair line and on the back were discussed during the AWV and patient verbalized understanding of the need to follow up with PCP before a referral to dermatology would be placed.  Appointment was scheduled to follow up with Dr. Derrel Nip. See appointment note. Patient stated papules (spots) were not itchy, draining, red or tender to touch but thought to be slowly increasing in size.  Encouraged him to call and reschedule for a sooner appointment date if condition was worsening.

## 2016-05-20 NOTE — Telephone Encounter (Signed)
Pt's wife lvm stating that Octavia Bruckner was told during his appt with Denisa that she saw some spots on his skin and that he needed to see a dermatologist. She is following up with this. No referral is in his chart. Please cb [phone]

## 2016-06-15 ENCOUNTER — Ambulatory Visit (INDEPENDENT_AMBULATORY_CARE_PROVIDER_SITE_OTHER): Payer: Medicare PPO | Admitting: *Deleted

## 2016-06-15 DIAGNOSIS — I429 Cardiomyopathy, unspecified: Secondary | ICD-10-CM | POA: Diagnosis not present

## 2016-06-15 NOTE — Progress Notes (Signed)
Remote ICD transmission.   

## 2016-06-17 ENCOUNTER — Encounter: Payer: Self-pay | Admitting: Cardiology

## 2016-06-17 ENCOUNTER — Ambulatory Visit (INDEPENDENT_AMBULATORY_CARE_PROVIDER_SITE_OTHER): Payer: Medicare PPO | Admitting: Internal Medicine

## 2016-06-17 ENCOUNTER — Encounter: Payer: Self-pay | Admitting: Internal Medicine

## 2016-06-17 VITALS — BP 130/80 | HR 75 | Resp 16 | Wt 213.0 lb

## 2016-06-17 DIAGNOSIS — L989 Disorder of the skin and subcutaneous tissue, unspecified: Secondary | ICD-10-CM | POA: Diagnosis not present

## 2016-06-17 DIAGNOSIS — I2510 Atherosclerotic heart disease of native coronary artery without angina pectoris: Secondary | ICD-10-CM

## 2016-06-17 DIAGNOSIS — Z860100 Personal history of colon polyps, unspecified: Secondary | ICD-10-CM

## 2016-06-17 DIAGNOSIS — I482 Chronic atrial fibrillation, unspecified: Secondary | ICD-10-CM

## 2016-06-17 DIAGNOSIS — E559 Vitamin D deficiency, unspecified: Secondary | ICD-10-CM | POA: Diagnosis not present

## 2016-06-17 DIAGNOSIS — I5022 Chronic systolic (congestive) heart failure: Secondary | ICD-10-CM | POA: Diagnosis not present

## 2016-06-17 DIAGNOSIS — Z6835 Body mass index (BMI) 35.0-35.9, adult: Secondary | ICD-10-CM | POA: Diagnosis not present

## 2016-06-17 DIAGNOSIS — D126 Benign neoplasm of colon, unspecified: Secondary | ICD-10-CM | POA: Diagnosis not present

## 2016-06-17 DIAGNOSIS — Z79899 Other long term (current) drug therapy: Secondary | ICD-10-CM

## 2016-06-17 DIAGNOSIS — E66812 Obesity, class 2: Secondary | ICD-10-CM

## 2016-06-17 DIAGNOSIS — E669 Obesity, unspecified: Secondary | ICD-10-CM

## 2016-06-17 DIAGNOSIS — I10 Essential (primary) hypertension: Secondary | ICD-10-CM

## 2016-06-17 LAB — COMPREHENSIVE METABOLIC PANEL
ALT: 20 U/L (ref 0–53)
AST: 24 U/L (ref 0–37)
Albumin: 4.4 g/dL (ref 3.5–5.2)
Alkaline Phosphatase: 52 U/L (ref 39–117)
BUN: 20 mg/dL (ref 6–23)
CALCIUM: 9.3 mg/dL (ref 8.4–10.5)
CHLORIDE: 104 meq/L (ref 96–112)
CO2: 29 meq/L (ref 19–32)
CREATININE: 1.02 mg/dL (ref 0.40–1.50)
GFR: 75.19 mL/min (ref >60.00)
Glucose, Bld: 96 mg/dL (ref 70–99)
Potassium: 4.6 mEq/L (ref 3.5–5.1)
Sodium: 138 mEq/L (ref 135–145)
Total Bilirubin: 1 mg/dL (ref 0.2–1.2)
Total Protein: 7.1 g/dL (ref 6.0–8.3)

## 2016-06-17 LAB — CBC WITH DIFFERENTIAL/PLATELET
BASOS PCT: 0.6 % (ref 0.0–3.0)
Basophils Absolute: 0 10*3/uL (ref 0.0–0.1)
EOS ABS: 0.1 10*3/uL (ref 0.0–0.7)
Eosinophils Relative: 1.5 % (ref 0.0–5.0)
HCT: 46 % (ref 39.0–52.0)
Hemoglobin: 15.6 g/dL (ref 13.0–17.0)
LYMPHS ABS: 1.7 10*3/uL (ref 0.7–4.0)
Lymphocytes Relative: 26 % (ref 12.0–46.0)
MCHC: 33.9 g/dL (ref 30.0–36.0)
MCV: 98.8 fl (ref 78.0–100.0)
MONO ABS: 0.6 10*3/uL (ref 0.1–1.0)
Monocytes Relative: 8.9 % (ref 3.0–12.0)
NEUTROS ABS: 4.1 10*3/uL (ref 1.4–7.7)
Neutrophils Relative %: 63 % (ref 43.0–77.0)
PLATELETS: 207 10*3/uL (ref 150.0–400.0)
RBC: 4.65 Mil/uL (ref 4.22–5.81)
RDW: 13.9 % (ref 11.5–15.5)
WBC: 6.5 10*3/uL (ref 4.0–10.5)

## 2016-06-17 LAB — CUP PACEART REMOTE DEVICE CHECK
Brady Statistic AP VS Percent: 0 %
Brady Statistic AS VP Percent: 18.28 %
Brady Statistic AS VS Percent: 81.72 %
Brady Statistic RV Percent Paced: 23.05 %
HIGH POWER IMPEDANCE MEASURED VALUE: 456 Ohm
HighPow Impedance: 70 Ohm
Implantable Lead Implant Date: 20130502
Implantable Lead Location: 753859
Implantable Lead Location: 753860
Implantable Lead Model: 5076
Implantable Lead Model: 6935
Lead Channel Impedance Value: 456 Ohm
Lead Channel Pacing Threshold Amplitude: 0.625 V
Lead Channel Pacing Threshold Pulse Width: 0.4 ms
Lead Channel Sensing Intrinsic Amplitude: 0.625 mV
Lead Channel Sensing Intrinsic Amplitude: 0.625 mV
Lead Channel Sensing Intrinsic Amplitude: 6.875 mV
Lead Channel Setting Pacing Amplitude: 2 V
Lead Channel Setting Pacing Pulse Width: 0.4 ms
Lead Channel Setting Sensing Sensitivity: 0.3 mV
MDC IDC LEAD IMPLANT DT: 20130502
MDC IDC MSMT BATTERY VOLTAGE: 2.7 V
MDC IDC MSMT LEADCHNL RA PACING THRESHOLD AMPLITUDE: 1.25 V
MDC IDC MSMT LEADCHNL RA PACING THRESHOLD PULSEWIDTH: 0.4 ms
MDC IDC MSMT LEADCHNL RV IMPEDANCE VALUE: 608 Ohm
MDC IDC MSMT LEADCHNL RV SENSING INTR AMPL: 6.875 mV
MDC IDC PG IMPLANT DT: 20130502
MDC IDC SESS DTM: 20180207051707
MDC IDC STAT BRADY AP VP PERCENT: 0 %
MDC IDC STAT BRADY RA PERCENT PACED: 0 %

## 2016-06-17 LAB — LIPID PANEL
CHOLESTEROL: 181 mg/dL (ref 0–200)
HDL: 41.7 mg/dL (ref >39.00)
LDL CALC: 111 mg/dL — AB (ref 0–99)
NonHDL: 139.15
Total CHOL/HDL Ratio: 4
Triglycerides: 142 mg/dL (ref 0.0–149.0)
VLDL: 28.4 mg/dL (ref 0.0–40.0)

## 2016-06-17 LAB — VITAMIN D 25 HYDROXY (VIT D DEFICIENCY, FRACTURES): VITD: 16.25 ng/mL — ABNORMAL LOW (ref 30.00–100.00)

## 2016-06-17 MED ORDER — TRAZODONE HCL 50 MG PO TABS: 50.0000 mg | ORAL_TABLET | Freq: Every day | ORAL | 0 refills | Status: DC

## 2016-06-17 MED ORDER — TRAMADOL HCL 50 MG PO TABS: 50.0000 mg | ORAL_TABLET | Freq: Three times a day (TID) | ORAL | 2 refills | Status: DC | PRN

## 2016-06-17 NOTE — Patient Instructions (Addendum)
You can increase the sleeping pill to 1. 5 or 2 tablets at bedtime if it works,  In order to cut out out  the tylenol PM   You can use up to 2000 mg daily of tylenol safely (4000 mg can be taken daily  for a short time of 1-2 days )    I will continue to refill  Your tramadol,  But You will need to see me every 3 months for tramadol refills because of [place] laws

## 2016-06-17 NOTE — Progress Notes (Signed)
Pre visit review using our clinic review tool, if applicable. No additional management support is needed unless otherwise documented below in the visit note. 

## 2016-06-17 NOTE — Progress Notes (Signed)
Subjective:  Patient ID: Chris Price, male    DOB: [birth year]  Age: 76 y.o. MRN: [id number]  CC: The primary encounter diagnosis was Coronary artery disease involving native coronary artery of native heart without angina pectoris. Diagnoses of Essential hypertension, Vitamin D deficiency, Long-term use of high-risk medication, Skin lesion of scalp, Hx of colonic polyps, Tubular adenoma of colon, Chronic systolic heart failure ([place]), and Chronic atrial fibrillation ([place]) were also pertinent to this visit.  HPI Chris Price presents for reestablishment of care. Patient has been lost to follow up since June 2015 .  He has been following up with his cardiologist,  Dr Caryl Comes. He has been taking Lipitor but has not had hepatic enzymes checked since 2015.   Has been taking tramadol and trazodone,  Prescribed  For hip and back pain  And insomnia.  Prescribed by Harrie Foreman him Monday .  X rays reportedly normal   Has been seeing chiropractor Freddi Che who has been treating left sided sciatica brought on by a lifting incident that occurred [place] .  His pain has improved and he notes that participating in Water aerobics has helped  Has several growths on scalp,  Needs referral  to dermatology  Needs fasting labs today   Outpatient Medications Prior to Visit  Medication Sig Dispense Refill  . atorvastatin (LIPITOR) 80 MG tablet Take one tablet (80mg ) by mouth once daily 90 tablet 3  . carvedilol (COREG) 12.5 MG tablet Take 1 tablet (12.5 mg total) by mouth 2 (two) times daily with a meal. 180 tablet 3  . finasteride (PROSCAR) 5 MG tablet Take 5 mg by mouth daily.    . furosemide (LASIX) 20 MG tablet Take 1 tablet (20 mg total) by mouth daily. 90 tablet 3  . lisinopril (PRINIVIL,ZESTRIL) 5 MG tablet Take 1 tablet (5 mg total) by mouth daily. 90 tablet 3  . loperamide (IMODIUM A-D) 2 MG tablet Take 2 mg by mouth as needed for diarrhea or loose stools.    .  nitroGLYCERIN (NITROSTAT) 0.4 MG SL tablet Place 1 tablet (0.4 mg total) under the tongue every 5 (five) minutes as needed for chest pain. 25 tablet 3  . omeprazole (PRILOSEC) 20 MG capsule TAKE 1 CAPSULE 2 TIMES DAILY BEFORE A MEAL. FOR ACID REFLUX. 60 capsule 5  . rivaroxaban (XARELTO) 20 MG TABS tablet Take 1 tablet (20 mg total) by mouth daily with supper. 90 tablet 3  . spironolactone (ALDACTONE) 25 MG tablet Take 0.5 tablets (12.5 mg total) by mouth daily. 45 tablet 3  . Tamsulosin HCl (FLOMAX) 0.4 MG CAPS Take 0.4 mg by mouth daily.    . traMADol (ULTRAM) 50 MG tablet Take 50 mg by mouth 3 (three) times daily as needed.    . traZODone (DESYREL) 50 MG tablet Take 50 mg by mouth at bedtime.     No facility-administered medications prior to visit.     Review of Systems;  Patient denies headache, fevers, malaise, unintentional weight loss, skin rash, eye pain, sinus congestion and sinus pain, sore throat, dysphagia,  hemoptysis , cough, dyspnea, wheezing, chest pain, palpitations, orthopnea, edema, abdominal pain, nausea, melena, diarrhea, constipation, flank pain, dysuria, hematuria, urinary  Frequency, nocturia, numbness, tingling, seizures,  Focal weakness, Loss of consciousness,  Tremor, insomnia, depression, anxiety, and suicidal ideation.      Objective:  BP 130/80   Pulse 75   Resp 16   Wt 213 lb (96.6 kg)  SpO2 97%   BMI 34.38 kg/m   BP Readings from Last 3 Encounters:  06/17/16 130/80  05/12/16 122/70  03/16/16 126/72    Wt Readings from Last 3 Encounters:  06/17/16 213 lb (96.6 kg)  05/12/16 214 lb 12.8 oz (97.4 kg)  03/16/16 212 lb (96.2 kg)    General appearance: alert, cooperative and appears stated age Ears: normal TM's and external ear canals both ears Throat: lips, mucosa, and tongue normal; teeth and gums normal Neck: no adenopathy, no carotid bruit, supple, symmetrical, trachea midline and thyroid not enlarged, symmetric, no  tenderness/mass/nodules Back: symmetric, no curvature. ROM normal. No CVA tenderness. Lungs: clear to auscultation bilaterally Heart: regular rate and rhythm, S1, S2 normal, no murmur, click, rub or gallop Abdomen: soft, non-tender; bowel sounds normal; no masses,  no organomegaly Pulses: 2+ and symmetric Skin: left temple with 2 scaling hyperpigmented annular lesions.  Lymph nodes: Cervical, supraclavicular, and axillary nodes normal.  Lab Results  Component Value Date   HGBA1C 5.9 07/08/2013   HGBA1C 5.6 06/27/2012   HGBA1C 5.7 08/31/2011    Lab Results  Component Value Date   CREATININE 1.02 06/17/2016   CREATININE 1.00 03/17/2016   CREATININE 0.9 10/14/2013    Lab Results  Component Value Date   WBC 6.5 06/17/2016   HGB 15.6 06/17/2016   HCT 46.0 06/17/2016   PLT 207.0 06/17/2016   GLUCOSE 96 06/17/2016   CHOL 181 06/17/2016   TRIG 142.0 06/17/2016   HDL 41.70 06/17/2016   LDLDIRECT 143.6 03/21/2012   LDLCALC 111 (H) 06/17/2016   ALT 20 06/17/2016   AST 24 06/17/2016   NA 138 06/17/2016   K 4.6 06/17/2016   CL 104 06/17/2016   CREATININE 1.02 06/17/2016   BUN 20 06/17/2016   CO2 29 06/17/2016   TSH 1.33 07/08/2013   INR 1.6 (H) 07/31/2013   HGBA1C 5.9 07/08/2013    Dg Lumbar Spine Complete  Result Date: 03/17/2016 CLINICAL DATA:  Chronic lumbago EXAM: LUMBAR SPINE - COMPLETE 4+ VIEW COMPARISON:  Lumbar MRI November 20, 2006 FINDINGS: Frontal, lateral, spot lumbosacral lateral common bile oblique views were obtained. The there are 5 non-rib-bearing lumbar type vertebral bodies. The patient has had previous laminectomies from L3-L5. There is no fracture or spondylolisthesis. There is moderate disc space narrowing at L4-5 and L5-S1. There is slightly less disc space narrowing at all other levels. There are anterior osteophytes at all levels. There is facet osteoarthritic change to varying degrees at all levels, most notably at L4-5 and L5-S1 bilaterally. There is  atherosclerotic calcification in the aorta. IMPRESSION: Patient has had previous laminectomies at L3, L4, and L5. There is multilevel arthropathy, most notably at L4-5 and L5-S1. There is no fracture or spondylolisthesis. There is aortic atherosclerosis. Electronically Signed   By: Lowella Grip III M.D.   On: 03/17/2016 14:58   Dg Hip Unilat With Pelvis 2-3 Views Left  Result Date: 03/17/2016 CLINICAL DATA:  Pain for approximately 1 month EXAM: DG HIP (WITH OR WITHOUT PELVIS) 2-3V LEFT COMPARISON:  None. FINDINGS: Frontal pelvis as well as frontal and lateral left hip images were obtained. There is no fracture or dislocation. There is mild symmetric narrowing of both hip joints. No erosive change. Sacroiliac joints appear unremarkable. IMPRESSION: Mild symmetric narrowing both hip joints. No fracture or dislocation. No erosive change. Electronically Signed   By: Lowella Grip III M.D.   On: 03/17/2016 14:59    Assessment & Plan:   Problem List Items Addressed  This Visit    Chronic atrial fibrillation ([place])    Managed with demand pacer, carvedilol for cad  and xarelto,       Chronic systolic heart failure ([place])    He appears euvolemic on exam today, and is on an appropriate regimen for last known EF of 25-35% . He sees cardiology on a semi annual basis       Coronary artery disease - Primary   Relevant Orders   Lipid panel (Completed)   Hx of colonic polyps   Hypertension    Well controlled on current regimen. Renal function is normal,  no changes today.  Lab Results  Component Value Date   CREATININE 1.02 06/17/2016   Lab Results  Component Value Date   NA 138 06/17/2016   K 4.6 06/17/2016   CL 104 06/17/2016   CO2 29 06/17/2016         Relevant Orders   Comprehensive metabolic panel (Completed)   Skin lesion of scalp    Referral to [facility] for evaluation       Relevant Orders   Ambulatory referral to Dermatology   Tubular adenoma of colon    3  removed,  All 3 were adenomatous  .  3 yr follow up advised       Other Visit Diagnoses    Vitamin D deficiency       Relevant Orders   VITAMIN D 25 Hydroxy (Vit-D Deficiency, Fractures) (Completed)   Long-term use of high-risk medication       Relevant Orders   Comprehensive metabolic panel (Completed)   CBC with Differential/Platelet (Completed)      I have changed Mr. O'Brien's traZODone and traMADol. I am also having him maintain his tamsulosin, finasteride, loperamide, atorvastatin, carvedilol, furosemide, lisinopril, omeprazole, rivaroxaban, spironolactone, and nitroGLYCERIN.  Meds ordered this encounter  Medications  . traZODone (DESYREL) 50 MG tablet    Sig: Take 1 tablet (50 mg total) by mouth at bedtime.    Dispense:  90 tablet    Refill:  0  . traMADol (ULTRAM) 50 MG tablet    Sig: Take 1 tablet (50 mg total) by mouth 3 (three) times daily as needed.    Dispense:  60 tablet    Refill:  2   A total of 40 minutes was spent with patient more than half of which was spent in counseling patient on the above mentioned issues , reviewing and explaining recent labs and imaging studies done, and coordination of care. Medications Discontinued During This Encounter  Medication Reason  . traZODone (DESYREL) 50 MG tablet Reorder  . traMADol (ULTRAM) 50 MG tablet Reorder    Follow-up: No Follow-up on file.   Crecencio Mc, MD

## 2016-06-19 ENCOUNTER — Encounter: Payer: Self-pay | Admitting: Internal Medicine

## 2016-06-19 ENCOUNTER — Other Ambulatory Visit: Payer: Self-pay | Admitting: Internal Medicine

## 2016-06-19 DIAGNOSIS — L989 Disorder of the skin and subcutaneous tissue, unspecified: Secondary | ICD-10-CM | POA: Insufficient documentation

## 2016-06-19 DIAGNOSIS — Z86010 Personal history of colonic polyps: Secondary | ICD-10-CM | POA: Insufficient documentation

## 2016-06-19 DIAGNOSIS — Z79899 Other long term (current) drug therapy: Secondary | ICD-10-CM | POA: Insufficient documentation

## 2016-06-19 MED ORDER — ERGOCALCIFEROL 1.25 MG (50000 UT) PO CAPS: 50000.0000 [IU] | ORAL_CAPSULE | ORAL | 2 refills | Status: DC

## 2016-06-19 NOTE — Assessment & Plan Note (Signed)
Referral to [facility] for evaluation

## 2016-06-19 NOTE — Assessment & Plan Note (Signed)
I have addressed  BMI and recommended wt loss of 10% of body weight over the next 6 months using a low fat, low starch, high protein  fruit/vegetable based Mediterranean diet and 30 minutes of aerobic exercise a minimum of 5 days per week.   

## 2016-06-19 NOTE — Assessment & Plan Note (Addendum)
Managed with demand pacer, carvedilol for cad  and xarelto,

## 2016-06-19 NOTE — Assessment & Plan Note (Addendum)
He appears euvolemic on exam today, and is on an appropriate regimen for last known EF of 25-35% . He sees cardiology on a semi annual basis

## 2016-06-19 NOTE — Assessment & Plan Note (Signed)
3 removed,  All 3 were adenomatous  .  3 yr follow up advised

## 2016-06-19 NOTE — Assessment & Plan Note (Signed)
Well controlled on current regimen. Renal function is normal,  no changes today.  Lab Results  Component Value Date   CREATININE 1.02 06/17/2016   Lab Results  Component Value Date   NA 138 06/17/2016   K 4.6 06/17/2016   CL 104 06/17/2016   CO2 29 06/17/2016

## 2016-08-20 ENCOUNTER — Other Ambulatory Visit: Payer: Self-pay | Admitting: Internal Medicine

## 2016-08-22 NOTE — Telephone Encounter (Signed)
This was started on 06/19/16, 4 tablets one a week with 2 refills, please advise for refill now. thanks

## 2016-08-23 NOTE — Telephone Encounter (Signed)
Refilled

## 2016-09-14 ENCOUNTER — Ambulatory Visit (INDEPENDENT_AMBULATORY_CARE_PROVIDER_SITE_OTHER): Payer: Medicare PPO | Admitting: *Deleted

## 2016-09-14 DIAGNOSIS — I429 Cardiomyopathy, unspecified: Secondary | ICD-10-CM | POA: Diagnosis not present

## 2016-09-14 NOTE — Progress Notes (Signed)
Remote ICD transmission.   

## 2016-09-15 LAB — CUP PACEART REMOTE DEVICE CHECK
Battery Voltage: 2.64 V
Brady Statistic AS VP Percent: 19.28 %
Brady Statistic AS VS Percent: 80.72 %
Date Time Interrogation Session: 20180509125724
HIGH POWER IMPEDANCE MEASURED VALUE: 70 Ohm
HighPow Impedance: 418 Ohm
Implantable Lead Location: 753859
Implantable Lead Model: 6935
Lead Channel Impedance Value: 475 Ohm
Lead Channel Pacing Threshold Amplitude: 1.25 V
Lead Channel Pacing Threshold Pulse Width: 0.4 ms
Lead Channel Sensing Intrinsic Amplitude: 8.375 mV
Lead Channel Setting Pacing Amplitude: 2 V
MDC IDC LEAD IMPLANT DT: 20130502
MDC IDC LEAD IMPLANT DT: 20130502
MDC IDC LEAD LOCATION: 753860
MDC IDC MSMT LEADCHNL RA PACING THRESHOLD PULSEWIDTH: 0.4 ms
MDC IDC MSMT LEADCHNL RA SENSING INTR AMPL: 0.5 mV
MDC IDC MSMT LEADCHNL RA SENSING INTR AMPL: 0.5 mV
MDC IDC MSMT LEADCHNL RV IMPEDANCE VALUE: 608 Ohm
MDC IDC MSMT LEADCHNL RV PACING THRESHOLD AMPLITUDE: 0.625 V
MDC IDC MSMT LEADCHNL RV SENSING INTR AMPL: 8.375 mV
MDC IDC PG IMPLANT DT: 20130502
MDC IDC SET LEADCHNL RV PACING PULSEWIDTH: 0.4 ms
MDC IDC SET LEADCHNL RV SENSING SENSITIVITY: 0.3 mV
MDC IDC STAT BRADY AP VP PERCENT: 0 %
MDC IDC STAT BRADY AP VS PERCENT: 0 %
MDC IDC STAT BRADY RA PERCENT PACED: 0 %
MDC IDC STAT BRADY RV PERCENT PACED: 24.4 %

## 2016-09-16 ENCOUNTER — Encounter: Payer: Self-pay | Admitting: Cardiology

## 2016-10-22 ENCOUNTER — Other Ambulatory Visit: Payer: Self-pay | Admitting: Internal Medicine

## 2016-12-14 ENCOUNTER — Ambulatory Visit (INDEPENDENT_AMBULATORY_CARE_PROVIDER_SITE_OTHER): Payer: Medicare PPO | Admitting: *Deleted

## 2016-12-14 DIAGNOSIS — I429 Cardiomyopathy, unspecified: Secondary | ICD-10-CM

## 2016-12-14 NOTE — Progress Notes (Signed)
Remote ICD transmission.   

## 2016-12-15 ENCOUNTER — Encounter: Payer: Self-pay | Admitting: Cardiology

## 2016-12-19 ENCOUNTER — Telehealth: Payer: Self-pay | Admitting: Internal Medicine

## 2016-12-19 NOTE — Telephone Encounter (Signed)
Disability Parking Placard form dropped off. Placed in Dr. Demetrios Isaacs color folder upfront. please advise

## 2016-12-20 NOTE — Telephone Encounter (Signed)
Placed in red folder  

## 2016-12-20 NOTE — Telephone Encounter (Signed)
Spoke with pt and informed him that his paperwork is complete and ready to be picked up. Placed in folder up front.

## 2016-12-27 ENCOUNTER — Other Ambulatory Visit: Payer: Self-pay | Admitting: Internal Medicine

## 2016-12-28 NOTE — Telephone Encounter (Signed)
Last OV 06/17/2016 Next OV not scheduled Last refill 10/24/2016

## 2016-12-30 LAB — CUP PACEART REMOTE DEVICE CHECK
Brady Statistic AP VS Percent: 0 %
Brady Statistic AS VS Percent: 80.39 %
Brady Statistic RV Percent Paced: 25.07 %
HIGH POWER IMPEDANCE MEASURED VALUE: 399 Ohm
HighPow Impedance: 69 Ohm
Implantable Lead Implant Date: 20130502
Implantable Lead Location: 753859
Implantable Lead Model: 5076
Implantable Lead Model: 6935
Lead Channel Impedance Value: 456 Ohm
Lead Channel Impedance Value: 589 Ohm
Lead Channel Pacing Threshold Amplitude: 0.625 V
Lead Channel Pacing Threshold Pulse Width: 0.4 ms
Lead Channel Sensing Intrinsic Amplitude: 0.75 mV
Lead Channel Sensing Intrinsic Amplitude: 0.75 mV
Lead Channel Setting Pacing Amplitude: 2 V
Lead Channel Setting Pacing Pulse Width: 0.4 ms
MDC IDC LEAD IMPLANT DT: 20130502
MDC IDC LEAD LOCATION: 753860
MDC IDC MSMT BATTERY VOLTAGE: 2.62 V
MDC IDC MSMT LEADCHNL RA PACING THRESHOLD AMPLITUDE: 1.25 V
MDC IDC MSMT LEADCHNL RV PACING THRESHOLD PULSEWIDTH: 0.4 ms
MDC IDC MSMT LEADCHNL RV SENSING INTR AMPL: 8 mV
MDC IDC MSMT LEADCHNL RV SENSING INTR AMPL: 8 mV
MDC IDC PG IMPLANT DT: 20130502
MDC IDC SESS DTM: 20180808112207
MDC IDC SET LEADCHNL RV SENSING SENSITIVITY: 0.3 mV
MDC IDC STAT BRADY AP VP PERCENT: 0 %
MDC IDC STAT BRADY AS VP PERCENT: 19.61 %
MDC IDC STAT BRADY RA PERCENT PACED: 0 %

## 2017-01-19 ENCOUNTER — Telehealth: Payer: Self-pay | Admitting: Cardiology

## 2017-01-19 NOTE — Telephone Encounter (Signed)
DPR on File for Wife.  Spoke w/ pt wife and informed her that patient ICD has reached ERI. Informed her that a scheduler will be calling to schedule an appt w/ MD / PA / NP to discuss procedure and the procedure will be scheduled for another day. Patient wife verbalized understanding.

## 2017-01-23 ENCOUNTER — Encounter: Payer: Self-pay | Admitting: Internal Medicine

## 2017-01-23 ENCOUNTER — Ambulatory Visit (INDEPENDENT_AMBULATORY_CARE_PROVIDER_SITE_OTHER): Payer: Medicare PPO | Admitting: Internal Medicine

## 2017-01-23 VITALS — BP 100/62 | HR 79 | Ht 66.0 in | Wt 215.5 lb

## 2017-01-23 DIAGNOSIS — I428 Other cardiomyopathies: Secondary | ICD-10-CM | POA: Diagnosis not present

## 2017-01-23 DIAGNOSIS — I482 Chronic atrial fibrillation: Secondary | ICD-10-CM | POA: Diagnosis not present

## 2017-01-23 DIAGNOSIS — Z95810 Presence of automatic (implantable) cardiac defibrillator: Secondary | ICD-10-CM | POA: Diagnosis not present

## 2017-01-23 DIAGNOSIS — I5022 Chronic systolic (congestive) heart failure: Secondary | ICD-10-CM

## 2017-01-23 DIAGNOSIS — I4821 Permanent atrial fibrillation: Secondary | ICD-10-CM

## 2017-01-23 LAB — CUP PACEART INCLINIC DEVICE CHECK
Battery Voltage: 2.61 V
Brady Statistic RA Percent Paced: 0 %
Brady Statistic RV Percent Paced: 25.01 %
Date Time Interrogation Session: 20180917153402
HIGH POWER IMPEDANCE MEASURED VALUE: 399 Ohm
HighPow Impedance: 70 Ohm
Implantable Lead Location: 753859
Implantable Pulse Generator Implant Date: 20130502
Lead Channel Impedance Value: 418 Ohm
Lead Channel Sensing Intrinsic Amplitude: 0.375 mV
Lead Channel Setting Pacing Amplitude: 2 V
Lead Channel Setting Sensing Sensitivity: 0.3 mV
MDC IDC LEAD IMPLANT DT: 20130502
MDC IDC LEAD IMPLANT DT: 20130502
MDC IDC LEAD LOCATION: 753860
MDC IDC MSMT LEADCHNL RV IMPEDANCE VALUE: 551 Ohm
MDC IDC MSMT LEADCHNL RV PACING THRESHOLD AMPLITUDE: 0.5 V
MDC IDC MSMT LEADCHNL RV PACING THRESHOLD PULSEWIDTH: 0.4 ms
MDC IDC MSMT LEADCHNL RV SENSING INTR AMPL: 8.75 mV
MDC IDC SET LEADCHNL RV PACING PULSEWIDTH: 0.4 ms
MDC IDC STAT BRADY AP VP PERCENT: 0 %
MDC IDC STAT BRADY AP VS PERCENT: 0 %
MDC IDC STAT BRADY AS VP PERCENT: 19.71 %
MDC IDC STAT BRADY AS VS PERCENT: 80.29 %

## 2017-01-23 NOTE — Patient Instructions (Signed)
Medication Instructions: - Your physician recommends that you continue on your current medications as directed. Please refer to the Current Medication list given to you today.  Labwork: - none ordered  Procedures/Testing: - none ordered  Follow-Up: - Your physician recommends that you schedule a follow-up appointment in: 1 month with Dr. Caryl Comes.    Any Additional Special Instructions Will Be Listed Below (If Applicable).     If you need a refill on your cardiac medications before your next appointment, please call your pharmacy.

## 2017-01-23 NOTE — Progress Notes (Signed)
Patient Care Team: Crecencio Mc, MD as PCP - General (Internal Medicine)   HPI  Chris Price is a 76 y.o. male Seen in followup for an ICD implanted for primary prevention in nonischemic cardiomyopathy further complicated by sinus bradycardia/ brady -date of implant May 2013. He also has a history of atrial fibrillation for which he was managed with dabigitran and amiodarone. He developed a significant intention tremor prompting the down titration and subsequent discontinuation of his amiodaroneThere have been no appreciable change in his tremor  Atrial fibrillation is now permanent. He is pacing 20% time VVI 60   Catheterization 3/15 nonobstructive coronary disease ejection fraction 35%  . He is doing quite well. He does note fatigue with heavy exertion, for example digging in the yard with a high humidity.  He has not had any syncope. He has had no palpitations. He's had mild edema.      6'/17 Cr  0.9  K  4.5  Past Medical History:  Diagnosis Date  . Atrial fibrillation ([place])   . Cardiomyopathy -nonischemic    moderate to severe EF 25-30% March 2013  . CHF (congestive heart failure) ([place])   . Chronic systolic heart failure ([place]) 08/25/2011  . Coronary artery disease 08/15/2011   Moderate nonobstructive CAD with 60% mid LAD, 60% proximal RCA and 30 % stenossis left circumflex. Cardiac catheterization in March of 2015 showed no significant change   . Degenerative disk disease    lumbar  . Disc herniation   . GERD (gastroesophageal reflux disease)   . Hyperlipidemia   . Hypertension   . ICD (implantable cardiac defibrillator) in place   . IVCD (intraventricular conduction defect)    Nonspecific QRS duration 122  . Lumbar spinal stenosis   . Neuromuscular disorder ([place])    neauropathy   / siatica  . Persistent atrial fibrillation ([place]) 11/07/2011  . Sleep apnea    uses cpap    Past Surgical History:  Procedure Laterality Date  . BACK SURGERY    . CARDIAC  [facility]IZATION  [facility]2011   [facility]   . CARDIAC CATHETERIZATION  07/09/2013   [facility]  . CARDIOVERSION  08/2011   [facility]  . CHOLECYSTECTOMY  April 2015  . COLONOSCOPY WITH PROPOFOL N/A 07/30/2015   Procedure: COLONOSCOPY WITH PROPOFOL;  Surgeon: Lollie Sails, MD;  Location[facility] ENDOSCOPY;  Service: Endoscopy;  Laterality: N/A;  . cooled thermotherapy  0092   [place], complicated by incontinence  . HERNIA REPAIR    . ICD  09/08/2011  . IMPLANTABLE CARDIOVERTER DEFIBRILLATOR IMPLANT  09/08/2011   Procedure: IMPLANTABLE CARDIOVERTER DEFIBRILLATOR IMPLANT;  Surgeon: [name], MD;  Location: [facility] CATH LAB;  Service: Cardiovascular;;  . LUMBAR LAMINECTOMY     L2 through S1 with wide decompression of the thecal sac and nerve roots.  Marland Kitchen PROSTATE BIOPSY  2013    Current Outpatient Prescriptions  Medication Sig Dispense Refill  . atorvastatin (LIPITOR) 80 MG tablet Take one tablet (80mg ) by mouth once daily 90 tablet 3  . carvedilol (COREG) 12.5 MG tablet Take 1 tablet (12.5 mg total) by mouth 2 (two) times daily with a meal. 180 tablet 3  . finasteride (PROSCAR) 5 MG tablet Take 5 mg by mouth daily.    . furosemide (LASIX) 20 MG tablet Take 1 tablet (20 mg total) by mouth daily. 90 tablet 3  . lisinopril (PRINIVIL,ZESTRIL) 5 MG tablet Take 1 tablet (5 mg total) by mouth daily. 90 tablet 3  . loperamide (IMODIUM A-D)  2 MG tablet Take 2 mg by mouth as needed for diarrhea or loose stools.    . nitroGLYCERIN (NITROSTAT) 0.4 MG SL tablet Place 1 tablet (0.4 mg total) under the tongue every 5 (five) minutes as needed for chest pain. 25 tablet 3  . omeprazole (PRILOSEC) 20 MG capsule TAKE 1 CAPSULE 2 TIMES DAILY BEFORE A MEAL. FOR ACID REFLUX. 60 capsule 5  . rivaroxaban (XARELTO) 20 MG TABS tablet Take 1 tablet (20 mg total) by mouth daily with supper. 90 tablet 3  . spironolactone (ALDACTONE) 25 MG tablet Take 0.5 tablets (12.5 mg total) by mouth daily. 45 tablet 3  . Tamsulosin HCl (FLOMAX) 0.4 MG CAPS  Take 0.4 mg by mouth daily.    . traMADol (ULTRAM) 50 MG tablet Take 1 tablet (50 mg total) by mouth 3 (three) times daily as needed. 60 tablet 2  . traZODone (DESYREL) 50 MG tablet TAKE 1 TABLET AT BEDTIME 90 tablet 1  . Vitamin D, Ergocalciferol, (DRISDOL) 50000 units CAPS capsule TAKE 1 CAPSULE ONE TIME WEEKLY 12 capsule 2   No current facility-administered medications for this visit.     No Known Allergies  Review of Systems negative except from HPI and PMH  Physical Exam BP 100/62 (BP Location: Left Arm, Patient Position: Sitting, Cuff Size: Large)   Pulse 79   Ht 5\' 6"  (1.676 m)   Wt 215 lb 8 oz (97.8 kg)   BMI 34.78 kg/m  Well developed and nourished in no acute distress HENT normal Neck supple with JVP-flat Carotids brisk and full without bruits Clear Device pocket well healed; without hematoma or erythema.  There is no tethering  Irregularly irregular rate and rhythm, no murmurs or gallops Abd-soft with active BS without hepatomegaly No Clubbing cyanosis edema Skin-warm and dry A & Oriented  Grossly normal sensory and motor function   ECG atrial fibrillation 78 Intervals-/13/41  IVCD   Assessment and  Plan  NICM  Afib-permanent  HTN  IVCD/LBBB  High Risk Medication Surveillance  ICD-MDT The patient's device was interrogated.  The information was reviewed. No changes were made in the programming.    Labs ok 6/15  His device has reached ERI somewhat prematurely.  We had a lengthy discussion including reviewing the [place] Trial data about the benefits of ICD in the nonischemic cohort Stratified by age.  This included discussion about end-of-life care and about the role of an ICD providing assurance against sudden death. We talked about competing modes of dying.  He is pacing ventricularly about 20% at a lower rate of 60. We have reprogrammed the device to assess the need for pacing at a rate of 40 bpm.  In the event that there were significant pacing, this  would inform the risk benefit assessment associated with ICD change, specifically related to the risk of infection   He is euvolemic.  While his blood pressure is low, he is having no lightheadedness.  More than 50% of 45 min was spent in counseling related to the above

## 2017-03-07 ENCOUNTER — Ambulatory Visit (INDEPENDENT_AMBULATORY_CARE_PROVIDER_SITE_OTHER): Payer: Medicare PPO | Admitting: Internal Medicine

## 2017-03-07 ENCOUNTER — Encounter: Payer: Self-pay | Admitting: Internal Medicine

## 2017-03-07 DIAGNOSIS — I10 Essential (primary) hypertension: Secondary | ICD-10-CM

## 2017-03-07 DIAGNOSIS — I429 Cardiomyopathy, unspecified: Secondary | ICD-10-CM | POA: Diagnosis not present

## 2017-03-07 DIAGNOSIS — Z01812 Encounter for preprocedural laboratory examination: Secondary | ICD-10-CM | POA: Diagnosis not present

## 2017-03-07 NOTE — Progress Notes (Signed)
Patient Care Team: Crecencio Mc, MD as PCP - General (Internal Medicine)   HPI  Chris Price is a 76 y.o. male Seen in followup for an ICD implanted for primary prevention in nonischemic cardiomyopathy further complicated by sinus bradycardia/ brady -date of implant May 2013. He also has a history of atrial fibrillation for which he was managed with dabigitran and amiodarone. He developed a significant intention tremor prompting the down titration and subsequent discontinuation of his amiodaroneThere have been no appreciable change in his tremor  Atrial fibrillation is now permanent. He is pacing 20% time VVI 60   Catheterization 3/15 nonobstructive coronary disease ejection fraction 35%  Denies chest pain or shortness of breath.  Lengthy discussions with his family, they would like to proceed with device generator replacement.  Rather than later.   6'/17 Cr  0.9  K  4.5  Past Medical History:  Diagnosis Date  . Atrial fibrillation ([place])   . Cardiomyopathy -nonischemic    moderate to severe EF 25-30% March 2013  . CHF (congestive heart failure) ([place])   . Chronic systolic heart failure ([place]) 08/25/2011  . Coronary artery disease 08/15/2011   Moderate nonobstructive CAD with 60% mid LAD, 60% proximal RCA and 30 % stenossis left circumflex. Cardiac catheterization in March of 2015 showed no significant change   . Degenerative disk disease    lumbar  . Disc herniation   . GERD (gastroesophageal reflux disease)   . Hyperlipidemia   . Hypertension   . ICD (implantable cardiac defibrillator) in place   . IVCD (intraventricular conduction defect)    Nonspecific QRS duration 122  . Lumbar spinal stenosis   . Neuromuscular disorder ([place])    neauropathy   / siatica  . Persistent atrial fibrillation ([place]) 11/07/2011  . Sleep apnea    uses cpap    Past Surgical History:  Procedure Laterality Date  . BACK SURGERY    . CARD[facility]  [facility]   . CARDIAC  CATHETERIZATION  07/09/2013   [facility]  . CARDI[facility]ERSION  08/2011   [facility]  . CHOLECYSTECTOMY  April 2015  . COLONOSCOPY WITH PROPOFOL N/A 07/30/2015   Procedure: COLONOSCOPY WITH PROPOFOL;  Surgeon: Lollie Sails, MD;  Location: [facility] ENDOSCOPY;  Service: Endoscopy;  Laterality: N/A;  . cooled thermotherapy  2202   [place], complicated by incontinence  . HERNIA REPAIR    . ICD  09/08/2011  . IMPLANTABLE CARDIOVERTER DEFIBRILLATOR IMPLANT  09/08/2011   Procedure: IMPLANTABLE CARDIOVERTER DEFIBRILLATOR IMPLANT;  Surgeon: [name], MD;  Location: [facility] CATH LAB;  Service: Cardiovascular;;  . LUMBAR LAMINECTOMY     L2 through S1 with wide decompression of the thecal sac and nerve roots.  Marland Kitchen PROSTATE BIOPSY  2013    Current Outpatient Prescriptions  Medication Sig Dispense Refill  . atorvastatin (LIPITOR) 80 MG tablet Take one tablet (80mg ) by mouth once daily 90 tablet 3  . carvedilol (COREG) 12.5 MG tablet Take 1 tablet (12.5 mg total) by mouth 2 (two) times daily with a meal. 180 tablet 3  . finasteride (PROSCAR) 5 MG tablet Take 5 mg by mouth daily.    . furosemide (LASIX) 20 MG tablet Take 1 tablet (20 mg total) by mouth daily. 90 tablet 3  . lisinopril (PRINIVIL,ZESTRIL) 5 MG tablet Take 1 tablet (5 mg total) by mouth daily. 90 tablet 3  . loperamide (IMODIUM A-D) 2 MG tablet Take 2 mg by mouth as needed for diarrhea or loose stools.    Marland Kitchen  nitroGLYCERIN (NITROSTAT) 0.4 MG SL tablet Place 1 tablet (0.4 mg total) under the tongue every 5 (five) minutes as needed for chest pain. 25 tablet 3  . omeprazole (PRILOSEC) 20 MG capsule TAKE 1 CAPSULE 2 TIMES DAILY BEFORE A MEAL. FOR ACID REFLUX. 60 capsule 5  . rivaroxaban (XARELTO) 20 MG TABS tablet Take 1 tablet (20 mg total) by mouth daily with supper. 90 tablet 3  . spironolactone (ALDACTONE) 25 MG tablet Take 0.5 tablets (12.5 mg total) by mouth daily. 45 tablet 3  . Tamsulosin HCl (FLOMAX) 0.4 MG CAPS Take 0.4 mg by mouth daily.    . traMADol  (ULTRAM) 50 MG tablet Take 1 tablet (50 mg total) by mouth 3 (three) times daily as needed. 60 tablet 2  . traZODone (DESYREL) 50 MG tablet TAKE 1 TABLET AT BEDTIME 90 tablet 1  . Vitamin D, Ergocalciferol, (DRISDOL) 50000 units CAPS capsule TAKE 1 CAPSULE ONE TIME WEEKLY 12 capsule 2   No current facility-administered medications for this visit.     No Known Allergies  Review of Systems negative except from HPI and PMH  Physical Exam BP 136/82 (BP Location: Left Arm, Patient Position: Sitting, Cuff Size: Normal)   Pulse 74   Ht 5\' 6"  (1.676 m)   Wt 218 lb 8 oz (99.1 kg)   BMI 35.27 kg/m  Well developed and nourished in no acute distress HENT normal Neck supple with JVP-flat Clear Regular rate and rhythm, no murmurs or gallops Abd-soft with active BS No Clubbing cyanosis edema Skin-warm and dry A & Oriented  Grossly normal sensory and motor function    ECG atrial fibrillation 78 Intervals-/13/41  IVCD   Assessment and  Plan  NICM  Afib-permanent  HTN  IVCD/LBBB  High Risk Medication Surveillance  ICD-MDT The patient's device was interrogated.  The information was reviewed. No changes were made in the programming.    Labs ok 6/15  His device has reached ERI somewhat prematurely.  He has discussed with his family desires regarding device generator replacement.  He would like to proceed.  I have reached out to [employer] regarding premature depletion. We have reviewed the benefits and risks of generator replacement.  These include but are not limited to lead fracture and infection.  The patient understands, agrees and is willing to proceed.

## 2017-03-07 NOTE — Patient Instructions (Signed)
Medication Instructions:  Your physician recommends that you continue on your current medications as directed. Please refer to the Current Medication list given to you today.   Labwork: BMET, CBC  Testing/Procedures: Gen change tomorrow, 10/31 at [facility] [address] [place], [place] Stay Please arrive at 10 am  Nothing to eat or drink after midnight tonight. Please do not take xarelto tonight. Hold lasix and spironolactone tomorrow morning.    Follow-Up: Your physician recommends that you schedule a follow-up appointment in: 14 days with [facility] for a wound check. Your physician recommends that you schedule a follow-up appointment in: 3 months with Dr. Caryl Comes.     Any Other Special Instructions Will Be Listed Below (If Applicable).     If you need a refill on your cardiac medications before your next appointment, please call your pharmacy.

## 2017-03-08 ENCOUNTER — Ambulatory Visit (HOSPITAL_COMMUNITY)
Admission: RE | Disposition: A | Discharge status: Home / Self Care | Payer: Self-pay | Source: Ambulatory Visit | Attending: Internal Medicine

## 2017-03-08 ENCOUNTER — Ambulatory Visit (HOSPITAL_COMMUNITY)
Admission: RE | Admit: 2017-03-08 | Discharge: 2017-03-08 | Disposition: A | Discharge status: Home / Self Care | Payer: Medicare PPO | Source: Ambulatory Visit | Attending: Internal Medicine | Admitting: Internal Medicine

## 2017-03-08 DIAGNOSIS — Z79899 Other long term (current) drug therapy: Secondary | ICD-10-CM | POA: Insufficient documentation

## 2017-03-08 DIAGNOSIS — E785 Hyperlipidemia, unspecified: Secondary | ICD-10-CM | POA: Diagnosis not present

## 2017-03-08 DIAGNOSIS — I5022 Chronic systolic (congestive) heart failure: Secondary | ICD-10-CM | POA: Insufficient documentation

## 2017-03-08 DIAGNOSIS — G4730 Sleep apnea, unspecified: Secondary | ICD-10-CM | POA: Insufficient documentation

## 2017-03-08 DIAGNOSIS — Z7901 Long term (current) use of anticoagulants: Secondary | ICD-10-CM | POA: Insufficient documentation

## 2017-03-08 DIAGNOSIS — Z95810 Presence of automatic (implantable) cardiac defibrillator: Secondary | ICD-10-CM | POA: Diagnosis present

## 2017-03-08 DIAGNOSIS — I2510 Atherosclerotic heart disease of native coronary artery without angina pectoris: Secondary | ICD-10-CM | POA: Insufficient documentation

## 2017-03-08 DIAGNOSIS — K219 Gastro-esophageal reflux disease without esophagitis: Secondary | ICD-10-CM | POA: Diagnosis not present

## 2017-03-08 DIAGNOSIS — I110 Hypertensive heart disease with heart failure: Secondary | ICD-10-CM | POA: Diagnosis not present

## 2017-03-08 DIAGNOSIS — Z4502 Encounter for adjustment and management of automatic implantable cardiac defibrillator: Secondary | ICD-10-CM | POA: Diagnosis not present

## 2017-03-08 DIAGNOSIS — Z7983 Long term (current) use of bisphosphonates: Secondary | ICD-10-CM | POA: Insufficient documentation

## 2017-03-08 DIAGNOSIS — I429 Cardiomyopathy, unspecified: Secondary | ICD-10-CM | POA: Insufficient documentation

## 2017-03-08 DIAGNOSIS — I481 Persistent atrial fibrillation: Secondary | ICD-10-CM | POA: Insufficient documentation

## 2017-03-08 HISTORY — PX: ICD GENERATOR CHANGEOUT: EP1231

## 2017-03-08 LAB — CBC
HEMATOCRIT: 44 % (ref 37.5–51.0)
HEMOGLOBIN: 15.4 g/dL (ref 13.0–17.7)
MCH: 32.9 pg (ref 26.6–33.0)
MCHC: 35 g/dL (ref 31.5–35.7)
MCV: 94 fL (ref 79–97)
Platelets: 214 10*3/uL (ref 150–379)
RBC: 4.68 x10E6/uL (ref 4.14–5.80)
RDW: 14.4 % (ref 12.3–15.4)
WBC: 7.2 10*3/uL (ref 3.4–10.8)

## 2017-03-08 LAB — BASIC METABOLIC PANEL
BUN/Creatinine Ratio: 17 (ref 10–24)
BUN: 16 mg/dL (ref 8–27)
CO2: 22 mmol/L (ref 20–29)
Calcium: 9 mg/dL (ref 8.6–10.2)
Chloride: 103 mmol/L (ref 96–106)
Creatinine, Ser: 0.95 mg/dL (ref 0.76–1.27)
GFR calc Af Amer: 88 mL/min/{1.73_m2} (ref >59)
GFR calc non Af Amer: 76 mL/min/{1.73_m2} (ref >59)
Glucose: 84 mg/dL (ref 65–99)
Potassium: 4.9 mmol/L (ref 3.5–5.2)
Sodium: 138 mmol/L (ref 134–144)

## 2017-03-08 LAB — SURGICAL PCR SCREEN
MRSA, PCR: NEGATIVE
STAPHYLOCOCCUS AUREUS: NEGATIVE

## 2017-03-08 SURGERY — ICD GENERATOR CHANGEOUT
Anesthesia: LOCAL

## 2017-03-08 MED ORDER — ONDANSETRON HCL 4 MG/2ML IJ SOLN: 4.0000 mg | Freq: Four times a day (QID) | INTRAMUSCULAR | Status: DC | PRN

## 2017-03-08 MED ORDER — BUPIVACAINE HCL (PF) 0.25 % IJ SOLN
INTRAMUSCULAR | Status: DC | PRN
  Administered 2017-03-08: 45 mL

## 2017-03-08 MED ORDER — ACETAMINOPHEN 325 MG PO TABS
325.0000 mg | ORAL_TABLET | ORAL | Status: DC | PRN
  Filled 2017-03-08: qty 2

## 2017-03-08 MED ORDER — SODIUM CHLORIDE 0.9 % IR SOLN
Status: AC
  Filled 2017-03-08: qty 2

## 2017-03-08 MED ORDER — MUPIROCIN 2 % EX OINT
TOPICAL_OINTMENT | CUTANEOUS | Status: AC
  Administered 2017-03-08: 1 via TOPICAL
  Filled 2017-03-08: qty 22

## 2017-03-08 MED ORDER — MIDAZOLAM HCL 5 MG/5ML IJ SOLN
INTRAMUSCULAR | Status: AC
  Filled 2017-03-08: qty 5

## 2017-03-08 MED ORDER — MUPIROCIN 2 % EX OINT
1.0000 "application " | TOPICAL_OINTMENT | Freq: Once | CUTANEOUS | Status: AC
  Administered 2017-03-08: 1 via TOPICAL

## 2017-03-08 MED ORDER — SODIUM CHLORIDE 0.9 % IV SOLN: INTRAVENOUS | Status: AC

## 2017-03-08 MED ORDER — SODIUM CHLORIDE 0.9 % IR SOLN
80.0000 mg | Status: AC
  Administered 2017-03-08: 80 mg

## 2017-03-08 MED ORDER — CEFAZOLIN SODIUM-DEXTROSE 2-4 GM/100ML-% IV SOLN
INTRAVENOUS | Status: AC
  Filled 2017-03-08: qty 100

## 2017-03-08 MED ORDER — BUPIVACAINE HCL (PF) 0.25 % IJ SOLN
INTRAMUSCULAR | Status: AC
  Filled 2017-03-08: qty 60

## 2017-03-08 MED ORDER — SODIUM CHLORIDE 0.9 % IV SOLN
INTRAVENOUS | Status: DC
  Administered 2017-03-08: 10:00:00 via INTRAVENOUS

## 2017-03-08 MED ORDER — CEFAZOLIN SODIUM-DEXTROSE 2-4 GM/100ML-% IV SOLN
2.0000 g | INTRAVENOUS | Status: AC
  Administered 2017-03-08: 2 g via INTRAVENOUS

## 2017-03-08 MED ORDER — MIDAZOLAM HCL 5 MG/5ML IJ SOLN
INTRAMUSCULAR | Status: DC | PRN
  Administered 2017-03-08: 1 mg via INTRAVENOUS
  Administered 2017-03-08: 2 mg via INTRAVENOUS
  Administered 2017-03-08: 1 mg via INTRAVENOUS

## 2017-03-08 MED ORDER — FENTANYL CITRATE (PF) 100 MCG/2ML IJ SOLN
INTRAMUSCULAR | Status: AC
  Filled 2017-03-08: qty 2

## 2017-03-08 MED ORDER — FENTANYL CITRATE (PF) 100 MCG/2ML IJ SOLN
INTRAMUSCULAR | Status: DC | PRN
  Administered 2017-03-08 (×3): 25 ug via INTRAVENOUS

## 2017-03-08 SURGICAL SUPPLY — 6 items
CABLE SURGICAL S-101-97-12 (CABLE) ×2 IMPLANT
HEMOSTAT SURGICEL 2X4 FIBR (HEMOSTASIS) ×2 IMPLANT
ICD EVERA XT MRI DF1  DDMB1D1 (ICD Generator) ×1 IMPLANT
ICD EVERA XT MRI DF1 DDMB1D1 (ICD Generator) ×1 IMPLANT
PAD DEFIB LIFELINK (PAD) ×2 IMPLANT
TRAY PACEMAKER INSERTION (PACKS) ×2 IMPLANT

## 2017-03-08 NOTE — H&P (View-Only) (Signed)
Patient Care Team: Crecencio Mc, MD as PCP - General (Internal Medicine)   HPI  Chris Price is a 76 y.o. male Seen in followup for an ICD implanted for primary prevention in nonischemic cardiomyopathy further complicated by sinus bradycardia/ brady -date of implant May 2013. He also has a history of atrial fibrillation for which he was managed with dabigitran and amiodarone. He developed a significant intention tremor prompting the down titration and subsequent discontinuation of his amiodaroneThere have been no appreciable change in his tremor  Atrial fibrillation is now permanent. He is pacing 20% time VVI 60   Catheterization 3/15 nonobstructive coronary disease ejection fraction 35%  Denies chest pain or shortness of breath.  Lengthy discussions with his family, they would like to proceed with device generator replacement.  Rather than later.   6'/17 Cr  0.9  K  4.5  Past Medical History:  Diagnosis Date  . Atrial fibrillation ([place])   . Cardiomyopathy -nonischemic    moderate to severe EF 25-30% March 2013  . CHF (congestive heart failure) ([place])   . Chronic systolic heart failure ([place]) 08/25/2011  . Coronary artery disease 08/15/2011   Moderate nonobstructive CAD with 60% mid LAD, 60% proximal RCA and 30 % stenossis left circumflex. Cardiac catheterization in March of 2015 showed no significant change   . Degenerative disk disease    lumbar  . Disc herniation   . GERD (gastroesophageal reflux disease)   . Hyperlipidemia   . Hypertension   . ICD (implantable cardiac defibrillator) in place   . IVCD (intraventricular conduction defect)    Nonspecific QRS duration 122  . Lumbar spinal stenosis   . Neuromuscular disorder ([place])    neauropathy   / siatica  . Persistent atrial fibrillation ([place]) 11/07/2011  . Sleep apnea    uses cpap    Past Surgical History:  Procedure Laterality Date  . BACK SURGERY    . [facility]  [facility]   . CARDIAC  CATHETERIZATION  07/09/2013   [facility]  . CARDIOVERSION  08/2011   [facility]  . CHOLECYSTECTOMY  April 2015  . COLONOSCOPY WITH PROPOFOL N/A 07/30/2015   Procedure: COLONOSCOPY WITH PROPOFOL;  Surgeon: Lollie Sails, MD;  Location: [facility] ENDOSCOPY;  Service: Endoscopy;  Laterality: N/A;  . cooled thermotherapy  5361   [place], complicated by incontinence  . HERNIA REPAIR    . ICD  09/08/2011  . IMPLANTABLE CARDIOVERTER DEFIBRILLATOR IMPLANT  09/08/2011   Procedure: IMPLANTABLE CARDIOVERTER DEFIBRILLATOR IMPLANT;  Surgeon: [name], MD;  Location: [facility] CATH LAB;  Service: Cardiovascular;;  . LUMBAR LAMINECTOMY     L2 through S1 with wide decompression of the thecal sac and nerve roots.  Marland Kitchen PROSTATE BIOPSY  2013    Current Outpatient Prescriptions  Medication Sig Dispense Refill  . atorvastatin (LIPITOR) 80 MG tablet Take one tablet (80mg ) by mouth once daily 90 tablet 3  . carvedilol (COREG) 12.5 MG tablet Take 1 tablet (12.5 mg total) by mouth 2 (two) times daily with a meal. 180 tablet 3  . finasteride (PROSCAR) 5 MG tablet Take 5 mg by mouth daily.    . furosemide (LASIX) 20 MG tablet Take 1 tablet (20 mg total) by mouth daily. 90 tablet 3  . lisinopril (PRINIVIL,ZESTRIL) 5 MG tablet Take 1 tablet (5 mg total) by mouth daily. 90 tablet 3  . loperamide (IMODIUM A-D) 2 MG tablet Take 2 mg by mouth as needed for diarrhea or loose stools.    Marland Kitchen  nitroGLYCERIN (NITROSTAT) 0.4 MG SL tablet Place 1 tablet (0.4 mg total) under the tongue every 5 (five) minutes as needed for chest pain. 25 tablet 3  . omeprazole (PRILOSEC) 20 MG capsule TAKE 1 CAPSULE 2 TIMES DAILY BEFORE A MEAL. FOR ACID REFLUX. 60 capsule 5  . rivaroxaban (XARELTO) 20 MG TABS tablet Take 1 tablet (20 mg total) by mouth daily with supper. 90 tablet 3  . spironolactone (ALDACTONE) 25 MG tablet Take 0.5 tablets (12.5 mg total) by mouth daily. 45 tablet 3  . Tamsulosin HCl (FLOMAX) 0.4 MG CAPS Take 0.4 mg by mouth daily.    . traMADol  (ULTRAM) 50 MG tablet Take 1 tablet (50 mg total) by mouth 3 (three) times daily as needed. 60 tablet 2  . traZODone (DESYREL) 50 MG tablet TAKE 1 TABLET AT BEDTIME 90 tablet 1  . Vitamin D, Ergocalciferol, (DRISDOL) 50000 units CAPS capsule TAKE 1 CAPSULE ONE TIME WEEKLY 12 capsule 2   No current facility-administered medications for this visit.     No Known Allergies  Review of Systems negative except from HPI and PMH  Physical Exam BP 136/82 (BP Location: Left Arm, Patient Position: Sitting, Cuff Size: Normal)   Pulse 74   Ht 5\' 6"  (1.676 m)   Wt 218 lb 8 oz (99.1 kg)   BMI 35.27 kg/m  Well developed and nourished in no acute distress HENT normal Neck supple with JVP-flat Clear Regular rate and rhythm, no murmurs or gallops Abd-soft with active BS No Clubbing cyanosis edema Skin-warm and dry A & Oriented  Grossly normal sensory and motor function    ECG atrial fibrillation 78 Intervals-/13/41  IVCD   Assessment and  Plan  NICM  Afib-permanent  HTN  IVCD/LBBB  High Risk Medication Surveillance  ICD-MDT The patient's device was interrogated.  The information was reviewed. No changes were made in the programming.    Labs ok 6/15  His device has reached ERI somewhat prematurely.  He has discussed with his family desires regarding device generator replacement.  He would like to proceed.  I have reached out to [employer] regarding premature depletion. We have reviewed the benefits and risks of generator replacement.  These include but are not limited to lead fracture and infection.  The patient understands, agrees and is willing to proceed.

## 2017-03-08 NOTE — Interval H&P Note (Signed)
ICD Criteria  Current LVEF:35%. Within 12 months prior to implant: No   Heart failure history: Yes, Class II  Cardiomyopathy history: Yes, Non-Ischemic Cardiomyopathy.  Atrial Fibrillation/Atrial Flutter: Yes, Permanent.  Ventricular tachycardia history: No.  Cardiac arrest history: No.  History of syndromes with risk of sudden death: No.  Previous ICD: Yes, Reason for ICD:  Primary prevention.  Current ICD indication: Primary  PPM indication: Yes. Pacing type: Ventricular. Less than 40% RV pacing requirement anticipated. I   Class I or II Bradycardia indication present: Yes  Beta Blocker therapy for 3 or more months: Yes, prescribed.   Ace Inhibitor/ARB therapy for 3 or more months: Yes, prescribed.   History and Physical Interval Note:  03/08/2017 12:31 PM  Chris Price  has presented today for surgery, with the diagnosis of icd eri  The various methods of treatment have been discussed with the patient and family. After consideration of risks, benefits and other options for treatment, the patient has consented to  Procedure(s): ICD [place] (N/A) as a surgical intervention .  The patient's history has been reviewed, patient examined, no change in status, stable for surgery.  I have reviewed the patient's chart and labs.  Questions were answered to the patient's satisfaction.     Virl Axe

## 2017-03-08 NOTE — Discharge Instructions (Signed)
Battery Change, Care After This sheet gives you information about how to care for yourself after your procedure. Your health care provider may also give you more specific instructions. If you have problems or questions, contact your health care provider. What can I expect after the procedure? After your procedure, it is common to have:  Pain or soreness at the site where the pacemaker was inserted.  Swelling at the site where the pacemaker was inserted.  Follow these instructions at home: Incision care  Keep the incision clean and dry until tomorrow evening ? Do not take baths, swim, or use a hot tub until your health care provider approves. ? You may shower Thursday evening. ? Pat the area dry with a clean towel. Do not rub the area. This may cause bleeding.  Follow instructions from your health care provider about how to take care of your incision. Make sure you: ? Wash your hands with soap and water before you change your bandage (dressing). If soap and water are not available, use hand sanitizer. ? Change your dressing as told by your health care provider.  Leave skin glue in place. These skin closures may need to stay in place for 2 weeks or longer.   Check your incision area every day for signs of infection. Check for: ? More redness, swelling, or pain. ? More fluid or blood. ? Warmth. ? Pus or a bad smell. Activity  Do not lift anything that is heavier than 10 lb (4.5 kg) until your health care provider says it is okay to do so.  For the first 2 weeks, or as long as told by your health care provider: ? Avoid lifting your left arm higher than your shoulder. ? Be gentle when you move your arms over your head. It is okay to raise your arm to comb your hair. ? Avoid strenuous exercise.  Ask your health care provider when it is okay to: ? Resume your normal activities. ? Return to work or school. ? Resume sexual activity. Eating and drinking  Eat a heart-healthy diet.  This should include plenty of fresh fruits and vegetables, whole grains, low-fat dairy products, and lean protein like chicken and fish.  Limit alcohol intake to no more than 1 drink a day for non-pregnant women and 2 drinks a day for men. One drink equals 12 oz of beer, 5 oz of wine, or 1 oz of hard liquor.  Check ingredients and nutrition facts on packaged foods and beverages. Avoid the following types of food: ? Food that is high in salt (sodium). ? Food that is high in saturated fat, like full-fat dairy or red meat. ? Food that is high in trans fat, like fried food. ? Food and drinks that are high in sugar. Lifestyle  Do not use any products that contain nicotine or tobacco, such as cigarettes and e-cigarettes. If you need help quitting, ask your health care provider.  Take steps to manage and control your weight.  Get regular exercise. Aim for 150 minutes of moderate-intensity exercise (such as walking or yoga) or 75 minutes of vigorous exercise (such as running or swimming) each week.  Manage other health problems, such as diabetes or high blood pressure. Ask your health care provider how you can manage these conditions. General instructions  Do not drive for 4 days after your procedure.  Take over-the-counter and prescription medicines only as told by your health care provider.  Avoid putting pressure on the area where the pacemaker was  placed.  If you need an MRI after your pacemaker has been placed, be sure to tell the health care provider who orders the MRI that you have a pacemaker.  Avoid close and prolonged exposure to electrical devices that have strong magnetic fields. These include: ? Cell phones. Avoid keeping them in a pocket near the pacemaker, and try using the ear opposite the pacemaker. ? MP3 players. ? Household appliances, like microwaves. ? Metal detectors. ? Electric generators. ? High-tension wires.  Keep all follow-up visits as directed by your health  care provider. This is important. Contact a health care provider if:  You have pain at the incision site that is not relieved by over-the-counter or prescription medicines.  You have any of these around your incision site or coming from it: ? More redness, swelling, or pain. ? Fluid or blood. ? Warmth to the touch. ? Pus or a bad smell.  You have a fever.  You feel brief, occasional palpitations, light-headedness, or any symptoms that you think might be related to your heart. Get help right away if:  You experience chest pain that is different from the pain at the pacemaker site.  You develop a red streak that extends above or below the incision site.  You experience shortness of breath.  You have palpitations or an irregular heartbeat.  You have light-headedness that does not go away quickly.  You faint or have dizzy spells.  Your pulse suddenly drops or increases rapidly and does not return to normal.  You begin to gain weight and your legs and ankles swell. Summary  After your procedure, it is common to have pain, soreness, and some swelling where the pacemaker was inserted.  Make sure to keep your incision clean and dry. Follow instructions from your health care provider about how to take care of your incision.  Check your incision every day for signs of infection, such as more pain or swelling, pus or a bad smell, warmth, or leaking fluid and blood.  Avoid strenuous exercise and lifting your left arm higher than your shoulder for 2 weeks, or as long as told by your health care provider. This information is not intended to replace advice given to you by your health care provider. Make sure you discuss any questions you have with your health care provider. Document Released: 02/13/2013 Document Revised: 03/17/2016 Document Reviewed: 03/17/2016 Elsevier Interactive Patient Education  2017 [employer].

## 2017-03-09 ENCOUNTER — Other Ambulatory Visit: Payer: Self-pay | Admitting: Internal Medicine

## 2017-03-09 ENCOUNTER — Encounter (HOSPITAL_COMMUNITY): Payer: Self-pay | Admitting: Internal Medicine

## 2017-03-09 DIAGNOSIS — I428 Other cardiomyopathies: Secondary | ICD-10-CM

## 2017-03-09 MED FILL — Sodium Chloride Irrigation Soln 0.9%: Qty: 500 | Status: AC

## 2017-03-09 MED FILL — Gentamicin Sulfate Inj 40 MG/ML: INTRAMUSCULAR | Qty: 2 | Status: AC

## 2017-03-10 ENCOUNTER — Telehealth: Payer: Self-pay | Admitting: Cardiology

## 2017-03-10 NOTE — Telephone Encounter (Signed)
Patient wife called and stated that home monitor will not work b/c it was assigned to patient old carelink profile and since the Representative that works with [employer] created patient a new carelink profile it will not allow patient to use his old monitor that is associated w/ his old carelink profile. He is going to send a remote transmission on Wednesday March 15, 2017 when he receives his new home monitor.

## 2017-03-17 LAB — CUP PACEART INCLINIC DEVICE CHECK
Battery Voltage: 2.61 V
Brady Statistic AP VP Percent: 0 %
Brady Statistic AS VP Percent: 4.56 %
Brady Statistic RA Percent Paced: 0 %
Brady Statistic RV Percent Paced: 1.37 %
HIGH POWER IMPEDANCE MEASURED VALUE: 456 Ohm
HIGH POWER IMPEDANCE MEASURED VALUE: 65 Ohm
Implantable Pulse Generator Implant Date: 20181031
Lead Channel Impedance Value: 608 Ohm
Lead Channel Pacing Threshold Amplitude: 1.25 V
Lead Channel Sensing Intrinsic Amplitude: 0.375 mV
Lead Channel Sensing Intrinsic Amplitude: 8.75 mV
Lead Channel Setting Pacing Pulse Width: 0.4 ms
Lead Channel Setting Sensing Sensitivity: 0.3 mV
MDC IDC MSMT LEADCHNL RA IMPEDANCE VALUE: 418 Ohm
MDC IDC MSMT LEADCHNL RA PACING THRESHOLD PULSEWIDTH: 0.4 ms
MDC IDC MSMT LEADCHNL RV PACING THRESHOLD AMPLITUDE: 0.5 V
MDC IDC MSMT LEADCHNL RV PACING THRESHOLD PULSEWIDTH: 0.4 ms
MDC IDC SESS DTM: 20181030155205
MDC IDC SET LEADCHNL RV PACING AMPLITUDE: 2 V
MDC IDC STAT BRADY AP VS PERCENT: 0 %
MDC IDC STAT BRADY AS VS PERCENT: 95.44 %

## 2017-03-23 ENCOUNTER — Ambulatory Visit (INDEPENDENT_AMBULATORY_CARE_PROVIDER_SITE_OTHER): Payer: Medicare PPO | Admitting: *Deleted

## 2017-03-23 DIAGNOSIS — I482 Chronic atrial fibrillation, unspecified: Secondary | ICD-10-CM

## 2017-03-23 DIAGNOSIS — I5022 Chronic systolic (congestive) heart failure: Secondary | ICD-10-CM | POA: Diagnosis not present

## 2017-03-23 LAB — CUP PACEART INCLINIC DEVICE CHECK
Brady Statistic AP VP Percent: 0 %
Brady Statistic AP VS Percent: 0 %
Brady Statistic AS VP Percent: 1.75 %
Brady Statistic AS VS Percent: 98.25 %
Brady Statistic RV Percent Paced: 1.11 %
Date Time Interrogation Session: 20181115165740
HIGH POWER IMPEDANCE MEASURED VALUE: 56 Ohm
Implantable Lead Implant Date: 20130502
Implantable Lead Location: 753860
Implantable Lead Model: 5076
Implantable Pulse Generator Implant Date: 20181031
Lead Channel Pacing Threshold Amplitude: 0.625 V
Lead Channel Pacing Threshold Pulse Width: 0.4 ms
Lead Channel Sensing Intrinsic Amplitude: 7.375 mV
Lead Channel Sensing Intrinsic Amplitude: 7.875 mV
Lead Channel Setting Pacing Amplitude: 2.5 V
Lead Channel Setting Pacing Pulse Width: 0.4 ms
MDC IDC LEAD IMPLANT DT: 20130502
MDC IDC LEAD LOCATION: 753859
MDC IDC MSMT BATTERY REMAINING LONGEVITY: 133 mo
MDC IDC MSMT BATTERY VOLTAGE: 3.16 V
MDC IDC MSMT LEADCHNL RA IMPEDANCE VALUE: 494 Ohm
MDC IDC MSMT LEADCHNL RV IMPEDANCE VALUE: 399 Ohm
MDC IDC MSMT LEADCHNL RV IMPEDANCE VALUE: 608 Ohm
MDC IDC SET LEADCHNL RV SENSING SENSITIVITY: 0.3 mV
MDC IDC STAT BRADY RA PERCENT PACED: 0 %

## 2017-03-24 NOTE — Progress Notes (Signed)
Wound check appointment. Dermabond removed. Wound without redness or edema. Incision edges approximated, wound well healed. Normal device function. Threshold, sensing, and impedances consistent with implant measurements. Device programmed at appropriate safety margins. Histogram distribution appropriate for patient and level of activity. Chronic AF + xarelto. (1) ventricular high rate noted x 2sec-no morphology change. Patient educated about wound care, arm mobility, and shock plan. ROV in 3 months with SK.

## 2017-05-12 ENCOUNTER — Ambulatory Visit (INDEPENDENT_AMBULATORY_CARE_PROVIDER_SITE_OTHER): Payer: PPO

## 2017-05-12 VITALS — BP 106/68 | HR 60 | Temp 98.1°F | Resp 16 | Ht 66.0 in | Wt 215.8 lb

## 2017-05-12 DIAGNOSIS — Z1331 Encounter for screening for depression: Secondary | ICD-10-CM | POA: Diagnosis not present

## 2017-05-12 DIAGNOSIS — Z0000 Encounter for general adult medical examination without abnormal findings: Secondary | ICD-10-CM | POA: Diagnosis not present

## 2017-05-12 NOTE — Progress Notes (Signed)
**Note DePriceIdentified via Obfuscation** Subjective:   Chris Price is a 76 y.o. male who presents for Medicare Annual/Subsequent preventive examination.  Review of Systems:  No ROS.  Medicare Wellness Visit. Additional risk factors are reflected in the social history.  Cardiac Risk Factors include: advanced age (>59men, >35 women);hypertension;obesity (BMI >30kg/m2)     Objective:    Vitals: BP 106/68 (BP Location: Left Arm, Patient Position: Sitting, Cuff Size: Normal)   Pulse 60   Temp 98.1 F (36.7 C) (Oral)   Resp 16   Ht 5\' 6"  (1.676 m)   Wt 215 lb 12.8 oz (97.9 kg)   SpO2 96%   BMI 34.83 kg/m   Body mass index is 34.83 kg/m.  Advanced Directives 05/12/2017 03/08/2017 05/12/2016 07/30/2015 05/13/2015 09/08/2011  Does Patient Have a Medical Advance Directive? Yes Yes Yes No Yes Patient would like information  Type of Advance Directive Chris Price Chris Price [name] -  Does patient want to make changes to medical advance directive? No - Patient declined No - Patient declined No - Patient declined - - -  Copy of [place] in Chart? Yes Yes - - Yes -  Would patient like information on creating a medical advance directive? - - - No - patient declined information - Referral made to social work    Tobacco Social History   Tobacco Use  Smoking Status Never Smoker  Smokeless Tobacco Never Used     Counseling given: Not Answered   Clinical Intake:  PrePricevisit preparation completed: Yes  Pain : No/denies pain     Nutritional Status: BMI > 30  Obese Diabetes: No  How often do you need to have someone help you when you read instructions, pamphlets, or other written materials from your doctor or pharmacy?: 1 - Never  Interpreter Needed?: No     Past Medical History:  Diagnosis Date  . Atrial fibrillation ([place])   . Cardiomyopathy -nonischemic    moderate to severe EF 25Price30% March  2013  . CHF (congestive heart failure) ([place])   . Chronic systolic heart failure ([place]) 08/25/2011  . Coronary artery disease 08/15/2011   Moderate nonobstructive CAD with 60% mid LAD, 60% proximal RCA and 30 % stenossis left circumflex. Cardiac catheterization in March of 2015 showed no significant change   . Degenerative disk disease    lumbar  . Disc herniation   . GERD (gastroesophageal reflux disease)   . Hyperlipidemia   . Hypertension   . ICD (implantable cardiac defibrillator) in place   . IVCD (intraventricular conduction defect)    Nonspecific QRS duration 122  . Lumbar spinal stenosis   . Neuromuscular disorder ([place])    neauropathy   / siatica  . Persistent atrial fibrillation ([place]) 11/07/2011  . Sleep apnea    uses cpap   Past Surgical History:  Procedure Laterality Date  . BACK SURGERY    . CARDIAC CATHETERIZATION  [facility]2011   [facility]   . CARDIAC CATHETERIZATION  07/09/2013   [facility]  . CARDIOVERSION  08/2011   [facility]  . CHOLECYSTECTOMY  April 2015  . COLONOSCOPY WITH PROPOFOL N/A 07/30/2015   Procedure: COLONOSCOPY WITH PROPOFOL;  Surgeon: Lollie Sails, MD;  Location[facility] ENDOSCOPY;  Service: Endoscopy;  Laterality: N/A;  . cooled thermotherapy  3825   [place], complicated by incontinence  . HERNIA REPAIR    . ICD  09/08/2011  . ICD GENERATOR CHANGEOUT N/A 03/08/2017  Procedure: ICD GENERATOR CHANGEOUT;  Surgeon: [name], MD;  Location: [facility] CV LAB;  Service: Cardiovascular;  Laterality: N/A;  . IMPLANTABLE CARDIOVERTER DEFIBRILLATOR IMPLANT  09/08/2011   Procedure: IMPLANTABLE CARDIOVERTER DEFIBRILLATOR IMPLANT;  Surgeon: [name], MD;  Location: [facility] CATH LAB;  Service: Cardiovascular;;  . LUMBAR LAMINECTOMY     L2 through S1 with wide decompression of the thecal sac and nerve roots.  Marland Kitchen PROSTATE BIOPSY  2013   Family History  Problem Relation Age of Onset  . Heart attack Father   . Heart attack Brother   . Heart attack Son    Social History    Socioeconomic History  . Marital status: Married    Spouse name: None  . Number of children: None  . Years of education: None  . Highest education level: None  Social Needs  . Financial resource strain: None  . Food insecurity - worry: None  . Food insecurity - inability: None  . Transportation needs - medical: None  . Transportation needs - nonPricemedical: None  Occupational History  . None  Tobacco Use  . Smoking status: Never Smoker  . Smokeless tobacco: Never Used  Substance and Sexual Activity  . Alcohol use: No  . Drug use: No  . Sexual activity: Not Currently  Other Topics Concern  . None  Social History Narrative  . None    Outpatient Encounter Medications as of 05/12/2017  Medication Sig  . atorvastatin (LIPITOR) 80 MG tablet TAKE 1 TABLET ONE TIME DAILY  . carvedilol (COREG) 12.5 MG tablet Take 1 tablet (12.5 mg total) by mouth 2 (two) times daily with a meal.  . diphenhydraminePriceacetaminophen (TYLENOL PM) 25Price500 MG TABS tablet Take 2 tablets by mouth at bedtime as needed (sleep).  . finasteride (PROSCAR) 5 MG tablet Take 5 mg by mouth daily.  . furosemide (LASIX) 20 MG tablet TAKE 1 TABLET EVERY DAY  . lisinopril (PRINIVIL,ZESTRIL) 5 MG tablet TAKE 1 TABLET EVERY DAY  . loperamide (IMODIUM APriceD) 2 MG tablet Take 2 mg by mouth as needed for diarrhea or loose stools.  . nitroGLYCERIN (NITROSTAT) 0.4 MG SL tablet Place 1 tablet (0.4 mg total) under the tongue every 5 (five) minutes as needed for chest pain.  Marland Kitchen omeprazole (PRILOSEC) 20 MG capsule TAKE 1 CAPSULE 2 TIMES DAILY BEFORE A MEAL. FOR ACID REFLUX. (Patient taking differently: Take 20 mg by mouth 2 (two) times daily as needed (heartburn). )  . rivaroxaban (XARELTO) 20 MG TABS tablet Take 1 tablet (20 mg total) by mouth daily with supper.  Marland Kitchen spironolactone (ALDACTONE) 25 MG tablet Take 0.5 tablets (12.5 mg total) by mouth daily.  . Tamsulosin HCl (FLOMAX) 0.4 MG CAPS Take 0.4 mg by mouth daily.  . traMADol (ULTRAM)  50 MG tablet Take 1 tablet (50 mg total) by mouth 3 (three) times daily as needed. (Patient taking differently: Take 50 mg by mouth 3 (three) times daily as needed for moderate pain. )  . traZODone (DESYREL) 50 MG tablet TAKE 1 TABLET AT BEDTIME (Patient taking differently: TAKE 50 mg  AT BEDTIME)  . Vitamin D, Ergocalciferol, (DRISDOL) 50000 units CAPS capsule TAKE 1 CAPSULE ONE TIME WEEKLY (Patient taking differently: TAKE 82505 units ONE TIME WEEKLY)   No facilityPriceadministered encounter medications on file as of 05/12/2017.     Activities of Daily Living In your present state of health, do you have any difficulty performing the following activities: 05/12/2017 03/08/2017  Hearing? Y N  Comment - -  Vision? N N  Difficulty concentrating or making decisions? N N  Comment - -  Walking or climbing stairs? N N  Comment - -  Dressing or bathing? N N  Doing errands, shopping? N -  Preparing Food and eating ? N -  Using the Toilet? N -  In the past six months, have you accidently leaked urine? N -  Do you have problems with loss of bowel control? N -  Managing your Medications? N -  Managing your Finances? Y -  Housekeeping or managing your Housekeeping? N -  Some recent data might be hidden    Patient Care Team: Crecencio Mc, MD as PCP - General (Internal Medicine)   Assessment:   This is a routine wellness examination for Natan. The goal of the wellness visit is to assist the patient how to close the gaps in care and create a preventative care plan for the patient.   The roster of all physicians providing medical care to patient is listed in the Snapshot section of the chart.  Taking calcium VIT D as appropriate/Osteoporosis risk reviewed.    Safety issues reviewed; Smoke and carbon monoxide detectors in the home. No firearms in the home.  Wears seatbelts when driving or riding with others. Patient does wear sunscreen or protective clothing when in direct sunlight. No violence  in the home.  Depression- PHQ 2 &9 complete.  No signs/symptoms or verbal communication regarding little pleasure in doing things, feeling down, depressed or hopeless. No changes in sleeping, energy, eating, concentrating.  No thoughts of self harm or harm towards others.  Time spent on this topic is 8 minutes.   Patient is alert, normal appearance, oriented to person/place/and time. Correctly identified the president of the [place], recall of 3/3 words, and performing simple calculations. Displays appropriate judgement and can read correct time from watch face.   No new identified risk were noted.  No failures at ADL's or IADL's.    BMI- discussed the importance of a healthy diet, water intake and the benefits of aerobic exercise. Educational material provided.   24 hour diet recall: Regular diet  Daily fluid intake: 2 cups of caffeine, 2 cups of water  Dental- every  months.  Eye- Visual acuity not assessed per patient preference since they have regular follow up with the ophthalmologist.  Wears corrective lenses.  Sleep patterns- Sleeps 8 hours at night.  Wakes feeling rested. CPAP in use.  Health maintenance gaps- closed.  Patient Concerns: None at this time. Follow up with PCP as needed.  Exercise Activities and Dietary recommendations Current Exercise Habits: Home exercise routine, Type of exercise: walking, Frequency (Times/Week): 3, Intensity: Mild  Goals    . DIET - INCREASE WATER INTAKE    . Increase physical activity     Stretch!! Chair exercises as demonstrated, increase as tolerated Silver sneaker water aerobics 3 times weekly, 45Price60 minutes        Fall Risk Fall Risk  05/12/2017 05/12/2016 05/13/2015 02/27/2013  Falls in the past year? No No No No   Depression Screen PHQ 2/9 Scores 05/12/2017 05/12/2016 05/13/2015 02/27/2013  PHQ - 2 Score 0 0 0 0    Cognitive Function MMSE - Mini Mental State Exam 05/12/2016 05/13/2015  Orientation to time 5 5  Orientation to Place 5 5   Registration 3 3  Attention/ Calculation 5 5  Recall 3 3  Language- name 2 objects 2 2  Language- repeat 1 1  Language- follow 3 step  command 3 3  Language- read & follow direction 1 1  Write a sentence 1 1  Copy design 1 1  Total score 30 30     6CIT Screen 05/12/2017  What Year? 0 points  What month? 0 points  What time? 0 points  Count back from 20 0 points  Months in reverse 0 points  Repeat phrase 0 points  Total Score 0    Immunization History  Administered Date(s) Administered  . Influenza Split 02/19/2012  . InfluenzaPriceUnspecified 02/08/2013, 02/07/2015, 02/09/2016, 02/20/2017  . Pneumococcal ConjugatePrice13 05/14/2015  . Pneumococcal PolysaccharidePrice23 03/21/2012  . Tdap 02/28/2011    Screening Tests Health Maintenance  Topic Date Due  . TETANUS/TDAP  02/27/2021  . INFLUENZA VACCINE  Completed  . PNA vac Low Risk Adult  Completed      Plan:    End of life planning; Advance aging; Advanced directives discussed. Copy of current HCPOA/Living Price on file.    I have personally reviewed and noted the following in the patient's chart:   . Medical and social history . Use of alcohol, tobacco or illicit drugs  . Current medications and supplements . Functional ability and status . Nutritional status . Physical activity . Advanced directives . List of other physicians . Hospitalizations, surgeries, and ER visits in previous 12 months . Vitals . Screenings to include cognitive, depression, and falls . Referrals and appointments  In addition, I have reviewed and discussed with patient certain preventive protocols, quality metrics, and best practice recommendations. A written personalized care plan for preventive services as well as general preventive health recommendations were provided to patient.     WeathersbyPriceBlaney, Denisa L, LPN  01/08/1193  I have reviewed the above information and agree with above.   Deborra Medina, MD

## 2017-05-12 NOTE — Patient Instructions (Addendum)
  Chris Price , Thank you for taking time to come for your Medicare Wellness Visit. I appreciate your ongoing commitment to your health goals. Please review the following plan we discussed and let me know if I can assist you in the future.   Follow up with Dr. Derrel Nip as needed.    Have a great day!  These are the goals we discussed: Goals    . DIET - INCREASE WATER INTAKE    . Increase physical activity     Stretch!! Chair exercises as demonstrated, increase as tolerated Silver sneaker water aerobics 3 times weekly, 45-60 minutes        This is a list of the screening recommended for you and due dates:  Health Maintenance  Topic Date Due  . Tetanus Vaccine  02/27/2021  . Flu Shot  Completed  . Pneumonia vaccines  Completed

## 2017-05-29 ENCOUNTER — Telehealth: Payer: Self-pay | Admitting: Internal Medicine

## 2017-05-29 DIAGNOSIS — I428 Other cardiomyopathies: Secondary | ICD-10-CM

## 2017-05-29 NOTE — Telephone Encounter (Signed)
Ok to refill 

## 2017-05-29 NOTE — Telephone Encounter (Signed)
°*  STAT* If patient is at the pharmacy, call can be transferred to refill team.   1. Which medications need to be refilled? (please list name of each medication and dose if known) Xarelto 20 MG - 1 tablet daily with supper  2. Which pharmacy/location (including street and city if local pharmacy) is medication to be sent to? [facility] mail order - Fax [phone]  3. Do they need a 30 day or 90 day supply? 90 day   Patient has new insurance

## 2017-05-29 NOTE — Telephone Encounter (Signed)
Please review for refill. Thanks!  

## 2017-05-30 NOTE — Telephone Encounter (Signed)
Please review for refill.  

## 2017-05-31 MED ORDER — RIVAROXABAN 20 MG PO TABS: 20.0000 mg | ORAL_TABLET | Freq: Every day | ORAL | 3 refills | Status: DC

## 2017-05-31 NOTE — Telephone Encounter (Signed)
Refill sent to [facility].

## 2017-06-06 ENCOUNTER — Encounter: Payer: Self-pay | Admitting: Internal Medicine

## 2017-06-06 ENCOUNTER — Ambulatory Visit: Payer: PPO | Admitting: Internal Medicine

## 2017-06-06 DIAGNOSIS — I5022 Chronic systolic (congestive) heart failure: Secondary | ICD-10-CM

## 2017-06-06 DIAGNOSIS — I482 Chronic atrial fibrillation: Secondary | ICD-10-CM | POA: Diagnosis not present

## 2017-06-06 DIAGNOSIS — Z95810 Presence of automatic (implantable) cardiac defibrillator: Secondary | ICD-10-CM

## 2017-06-06 DIAGNOSIS — I4821 Permanent atrial fibrillation: Secondary | ICD-10-CM

## 2017-06-06 DIAGNOSIS — I428 Other cardiomyopathies: Secondary | ICD-10-CM

## 2017-06-06 MED ORDER — RIVAROXABAN 20 MG PO TABS: 20.0000 mg | ORAL_TABLET | Freq: Every day | ORAL | 3 refills | Status: DC

## 2017-06-06 MED ORDER — SPIRONOLACTONE 25 MG PO TABS: 12.5000 mg | ORAL_TABLET | Freq: Every day | ORAL | 3 refills | Status: DC

## 2017-06-06 MED ORDER — CARVEDILOL 12.5 MG PO TABS: 12.5000 mg | ORAL_TABLET | Freq: Two times a day (BID) | ORAL | 3 refills | Status: DC

## 2017-06-06 MED ORDER — NITROGLYCERIN 0.4 MG SL SUBL: 0.4000 mg | SUBLINGUAL_TABLET | SUBLINGUAL | 3 refills | Status: DC | PRN

## 2017-06-06 MED ORDER — FUROSEMIDE 20 MG PO TABS: 20.0000 mg | ORAL_TABLET | Freq: Every day | ORAL | 3 refills | Status: DC

## 2017-06-06 MED ORDER — LISINOPRIL 5 MG PO TABS: 5.0000 mg | ORAL_TABLET | Freq: Every day | ORAL | 3 refills | Status: DC

## 2017-06-06 NOTE — Patient Instructions (Signed)

## 2017-06-06 NOTE — Progress Notes (Signed)
Patient Care Team: Crecencio Mc, MD as PCP - General (Internal Medicine)   HPI  Chris Price is a 77 y.o. male Seen in followup for an ICD implanted for primary prevention in nonischemic cardiomyopathy further complicated by sinus bradycardia/ brady -date of implant May 2013.  He underwent device generator replacement 10/18.    He also has a history of atrial fibrillation for which he was managed with dabigitran and amiodarone. He developed a significant intention tremor prompting the down titration and subsequent discontinuation of his amiodarone No change in his tremor  Atrial fibrillation is now permanent. He is pacing 20% time VVI 60   DATE Test    3/15 Cath   EF 35 % Nonobstructive disease               Date Cr K  10/18 0.95 4.9        He denies chest pain or shortness of breath.  He has no edema.  He has had no syncope.  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    Past Medical History:  Diagnosis Date  . Atrial fibrillation ([place])   . Cardiomyopathy -nonischemic    moderate to severe EF 25-30% March 2013  . CHF (congestive heart failure) ([place])   . Chronic systolic heart failure ([place]) 08/25/2011  . Coronary artery disease 08/15/2011   Moderate nonobstructive CAD with 60% mid LAD, 60% proximal RCA and 30 % stenossis left circumflex. Cardiac catheterization in March of 2015 showed no significant change   . Degenerative disk disease    lumbar  . Disc herniation   . GERD (gastroesophageal reflux disease)   . Hyperlipidemia   . Hypertension   . ICD (implantable cardiac defibrillator) in place   . IVCD (intraventricular conduction defect)    Nonspecific QRS duration 122  . Lumbar spinal stenosis   . Neuromuscular disorder ([place])    neauropathy   / siatica  . Persistent atrial fibrillation ([place]) 11/07/2011  . Sleep apnea    uses cpap    Past Surgical History:  Procedure Laterality Date  . BACK SURGERY    . CARDIAC CATHETERIZATION   [facility]15/2011   [facility]   . CARDIAC CATHETERIZATION  07/09/2013   [facility]  . CARDIOVERSION  08/2011   [facility]  . CHOLECYSTECTOMY  April 2015  . COLONOSCOPY WITH PROPOFOL N/A 07/30/2015   Procedure: COLONOSCOPY WITH PROPOFOL;  Surgeon: Lollie Sails, MD;  Location: [facility] ENDOSCOPY;  Service: Endoscopy;  Laterality: N/A;  . cooled thermotherapy  5366   [place], complicated by incontinence  . HERNIA REPAIR    . ICD  09/08/2011  . ICD GENERATOR CHANGEOUT N/A 03/08/2017   Procedure: ICD GENERATOR CHANGEOUT;  Surgeon: [name], MD;  Location: [place] CV LAB;  Service: Cardiovascular;  Laterality: N/A;  . IMPLANTABLE CARDIOVERTER DEFIBRILLATOR IMPLANT  09/08/2011   Procedure: IMPLANTABLE CARDIOVERTER DEFIBRILLATOR IMPLANT;  Surgeon: [name], MD;  Location: [facility] CATH LAB;  Service: Cardiovascular;;  . LUMBAR LAMINECTOMY     L2 through S1 with wide decompression of the thecal sac and nerve roots.  Marland Kitchen PROSTATE BIOPSY  2013    Current Outpatient Medications  Medication Sig Dispense Refill  . atorvastatin (LIPITOR) 80 MG tablet TAKE 1 TABLET ONE TIME DAILY 90 tablet 3  . carvedilol (COREG) 12.5 MG tablet Take 1 tablet (12.5 mg total) by mouth 2 (two) times daily with a meal. 180 tablet 3  . diphenhydramine-acetaminophen (TYLENOL PM) 25-500  MG TABS tablet Take 2 tablets by mouth at bedtime as needed (sleep).    . finasteride (PROSCAR) 5 MG tablet Take 5 mg by mouth daily.    . furosemide (LASIX) 20 MG tablet Take 1 tablet (20 mg total) by mouth daily. 90 tablet 3  . lisinopril (PRINIVIL,ZESTRIL) 5 MG tablet Take 1 tablet (5 mg total) by mouth daily. 90 tablet 3  . loperamide (IMODIUM A-D) 2 MG tablet Take 2 mg by mouth as needed for diarrhea or loose stools.    . nitroGLYCERIN (NITROSTAT) 0.4 MG SL tablet Place 1 tablet (0.4 mg total) under the tongue every 5 (five) minutes as needed for chest pain. 25 tablet 3  . omeprazole (PRILOSEC) 20 MG capsule TAKE 1 CAPSULE 2 TIMES DAILY BEFORE A MEAL. FOR ACID  REFLUX. (Patient taking differently: Take 20 mg by mouth 2 (two) times daily as needed (heartburn). ) 60 capsule 5  . rivaroxaban (XARELTO) 20 MG TABS tablet Take 1 tablet (20 mg total) by mouth daily with supper. 90 tablet 3  . spironolactone (ALDACTONE) 25 MG tablet Take 0.5 tablets (12.5 mg total) by mouth daily. 45 tablet 3  . Tamsulosin HCl (FLOMAX) 0.4 MG CAPS Take 0.4 mg by mouth daily.    . traMADol (ULTRAM) 50 MG tablet Take 1 tablet (50 mg total) by mouth 3 (three) times daily as needed. (Patient taking differently: Take 50 mg by mouth 3 (three) times daily as needed for moderate pain. ) 60 tablet 2  . traZODone (DESYREL) 50 MG tablet TAKE 1 TABLET AT BEDTIME (Patient taking differently: TAKE 50 mg  AT BEDTIME) 90 tablet 1  . Vitamin D, Ergocalciferol, (DRISDOL) 50000 units CAPS capsule TAKE 1 CAPSULE ONE TIME WEEKLY (Patient taking differently: TAKE 41740 units ONE TIME WEEKLY) 12 capsule 2   No current facility-administered medications for this visit.     Allergies  Allergen Reactions  . No Known Allergies     Review of Systems negative except from HPI and PMH  Physical Exam BP 113/74 (BP Location: Left Arm, Patient Position: Sitting, Cuff Size: Normal)   Ht 5' 6.5" (1.689 m)   Wt 218 lb 4 oz (99 kg)   BMI 34.70 kg/m  Well developed and nourished in no acute distress HENT normal Neck supple with JVP-flat Carotids brisk and full without bruits Clear Device pocket well healed; without hematoma or erythema.  There is no tethering  Irregularly irregular rate and rhythm with controlled ventricular response, no murmurs or gallops Abd-soft with active BS without hepatomegaly No Clubbing cyanosis edema Skin-warm and dry A & Oriented  Grossly normal sensory and motor function  ECG atrial fibrillation at 78 Intervals-/13/40 Access left -78  Assessment and  Plan  NICM  Afib-permanent  HTN  IVCD/LBBB  High Risk Medication Surveillance  ICD-MDT The patient's device  was interrogated.  The information was reviewed. No changes were made in the programming.    BP well controlled  On Anticoagulation;  No bleeding issues   Euvolemic continue current meds  Obesity.  He is going to be going back to exercise which is a great idea.   We spent more than 50% of our >25 min visit in face to face counseling regarding the above

## 2017-06-07 DIAGNOSIS — G4733 Obstructive sleep apnea (adult) (pediatric): Secondary | ICD-10-CM | POA: Diagnosis not present

## 2017-06-08 LAB — CUP PACEART INCLINIC DEVICE CHECK
Battery Remaining Longevity: 132 mo
Brady Statistic AP VP Percent: 0 %
Brady Statistic AS VS Percent: 99.67 %
Brady Statistic RV Percent Paced: 1.36 %
Date Time Interrogation Session: 20190129162533
HIGH POWER IMPEDANCE MEASURED VALUE: 63 Ohm
Implantable Lead Implant Date: 20130502
Implantable Lead Implant Date: 20130502
Implantable Lead Model: 5076
Implantable Lead Model: 6935
Implantable Pulse Generator Implant Date: 20181031
Lead Channel Impedance Value: 456 Ohm
Lead Channel Impedance Value: 646 Ohm
Lead Channel Pacing Threshold Pulse Width: 0.4 ms
Lead Channel Setting Pacing Pulse Width: 0.4 ms
MDC IDC LEAD LOCATION: 753859
MDC IDC LEAD LOCATION: 753860
MDC IDC MSMT BATTERY VOLTAGE: 3.13 V
MDC IDC MSMT LEADCHNL RA IMPEDANCE VALUE: 494 Ohm
MDC IDC MSMT LEADCHNL RV PACING THRESHOLD AMPLITUDE: 0.5 V
MDC IDC MSMT LEADCHNL RV SENSING INTR AMPL: 8.125 mV
MDC IDC SET LEADCHNL RV PACING AMPLITUDE: 2.5 V
MDC IDC SET LEADCHNL RV SENSING SENSITIVITY: 0.3 mV
MDC IDC STAT BRADY AP VS PERCENT: 0 %
MDC IDC STAT BRADY AS VP PERCENT: 0.33 %
MDC IDC STAT BRADY RA PERCENT PACED: 0 %

## 2017-06-29 ENCOUNTER — Encounter: Payer: Self-pay | Admitting: Internal Medicine

## 2017-06-29 ENCOUNTER — Ambulatory Visit (INDEPENDENT_AMBULATORY_CARE_PROVIDER_SITE_OTHER): Payer: PPO | Admitting: Internal Medicine

## 2017-06-29 VITALS — BP 116/80 | HR 79 | Temp 97.4°F | Resp 15 | Ht 66.5 in | Wt 214.6 lb

## 2017-06-29 DIAGNOSIS — Z6835 Body mass index (BMI) 35.0-35.9, adult: Secondary | ICD-10-CM

## 2017-06-29 DIAGNOSIS — E785 Hyperlipidemia, unspecified: Secondary | ICD-10-CM

## 2017-06-29 DIAGNOSIS — Z79899 Other long term (current) drug therapy: Secondary | ICD-10-CM

## 2017-06-29 DIAGNOSIS — G4733 Obstructive sleep apnea (adult) (pediatric): Secondary | ICD-10-CM

## 2017-06-29 DIAGNOSIS — R972 Elevated prostate specific antigen [PSA]: Secondary | ICD-10-CM | POA: Diagnosis not present

## 2017-06-29 DIAGNOSIS — R197 Diarrhea, unspecified: Secondary | ICD-10-CM

## 2017-06-29 DIAGNOSIS — Z9889 Other specified postprocedural states: Secondary | ICD-10-CM

## 2017-06-29 DIAGNOSIS — I482 Chronic atrial fibrillation, unspecified: Secondary | ICD-10-CM

## 2017-06-29 DIAGNOSIS — R739 Hyperglycemia, unspecified: Secondary | ICD-10-CM | POA: Diagnosis not present

## 2017-06-29 DIAGNOSIS — Z9989 Dependence on other enabling machines and devices: Secondary | ICD-10-CM

## 2017-06-29 DIAGNOSIS — E669 Obesity, unspecified: Secondary | ICD-10-CM | POA: Diagnosis not present

## 2017-06-29 DIAGNOSIS — R7301 Impaired fasting glucose: Secondary | ICD-10-CM | POA: Diagnosis not present

## 2017-06-29 DIAGNOSIS — I10 Essential (primary) hypertension: Secondary | ICD-10-CM | POA: Diagnosis not present

## 2017-06-29 DIAGNOSIS — R7303 Prediabetes: Secondary | ICD-10-CM | POA: Diagnosis not present

## 2017-06-29 LAB — CBC WITH DIFFERENTIAL/PLATELET
BASOS ABS: 0 10*3/uL (ref 0.0–0.1)
Basophils Relative: 0.7 % (ref 0.0–3.0)
EOS ABS: 0.2 10*3/uL (ref 0.0–0.7)
Eosinophils Relative: 2.1 % (ref 0.0–5.0)
HEMATOCRIT: 48.7 % (ref 39.0–52.0)
HEMOGLOBIN: 16.4 g/dL (ref 13.0–17.0)
LYMPHS PCT: 26.2 % (ref 12.0–46.0)
Lymphs Abs: 1.9 10*3/uL (ref 0.7–4.0)
MCHC: 33.6 g/dL (ref 30.0–36.0)
MCV: 99.5 fl (ref 78.0–100.0)
MONOS PCT: 10.1 % (ref 3.0–12.0)
Monocytes Absolute: 0.7 10*3/uL (ref 0.1–1.0)
NEUTROS ABS: 4.4 10*3/uL (ref 1.4–7.7)
Neutrophils Relative %: 60.9 % (ref 43.0–77.0)
Platelets: 235 10*3/uL (ref 150.0–400.0)
RBC: 4.9 Mil/uL (ref 4.22–5.81)
RDW: 13.9 % (ref 11.5–15.5)
WBC: 7.3 10*3/uL (ref 4.0–10.5)

## 2017-06-29 LAB — COMPREHENSIVE METABOLIC PANEL
ALBUMIN: 4.3 g/dL (ref 3.5–5.2)
ALK PHOS: 50 U/L (ref 39–117)
ALT: 22 U/L (ref 0–53)
AST: 24 U/L (ref 0–37)
BUN: 19 mg/dL (ref 6–23)
CALCIUM: 9.7 mg/dL (ref 8.4–10.5)
CO2: 25 meq/L (ref 19–32)
Chloride: 104 mEq/L (ref 96–112)
Creatinine, Ser: 1.04 mg/dL (ref 0.40–1.50)
GFR: 73.33 mL/min (ref >60.00)
Glucose, Bld: 98 mg/dL (ref 70–99)
Potassium: 4.2 mEq/L (ref 3.5–5.1)
Sodium: 141 mEq/L (ref 135–145)
Total Bilirubin: 1 mg/dL (ref 0.2–1.2)
Total Protein: 7.6 g/dL (ref 6.0–8.3)

## 2017-06-29 LAB — MICROALBUMIN / CREATININE URINE RATIO
CREATININE, U: 145.7 mg/dL
MICROALB/CREAT RATIO: 1.4 mg/g (ref 0.0–30.0)
Microalb, Ur: 2.1 mg/dL — ABNORMAL HIGH (ref 0.0–1.9)

## 2017-06-29 LAB — LIPID PANEL
CHOL/HDL RATIO: 4
CHOLESTEROL: 179 mg/dL (ref 0–200)
HDL: 40.6 mg/dL (ref >39.00)
LDL CALC: 113 mg/dL — AB (ref 0–99)
NonHDL: 138.63
TRIGLYCERIDES: 129 mg/dL (ref 0.0–149.0)
VLDL: 25.8 mg/dL (ref 0.0–40.0)

## 2017-06-29 LAB — HEMOGLOBIN A1C: Hgb A1c MFr Bld: 5.8 % (ref 4.6–6.5)

## 2017-06-29 LAB — MAGNESIUM: MAGNESIUM: 1.9 mg/dL (ref 1.5–2.5)

## 2017-06-29 MED ORDER — ZOSTER VAC RECOMB ADJUVANTED 50 MCG/0.5ML IM SUSR: 0.5000 mL | Freq: Once | INTRAMUSCULAR | 1 refills | Status: AC

## 2017-06-29 NOTE — Progress Notes (Signed)
Patient ID: Chris Price, male    DOB: 1940/07/08  Age: 77 y.o. MRN: [id number]  The patient is here for examination and management of chronic and acute problems.  Colonoscopy 2017: 3 polyps removed ; tubular adenomas.   5 yr follow u p advised   Cardiology follow  Up on Jun 06 2017   s/p  PACEMAKER  replacement due to recall of former PM for NICM.  Was done  by  Caryl Comes on Mar 08 2017     The risk factors are reflected in the social history.  The roster of all physicians providing medical care to patient - is listed in the Snapshot section of the chart.  Activities of daily living:  The patient is 100% independent in all ADLs: dressing, toileting, feeding as well as independent mobility  Home safety : The patient has smoke detectors in the home. They wear seatbelts.  There are no firearms at home. There is no violence in the home.   There is no risks for hepatitis, STDs or HIV. There is no   history of blood transfusion. They have no travel history to infectious disease endemic areas of the world.  The patient has seen their dentist in the last six month. They have seen their eye doctor in the last year. Chris Price has hearing loss in the right ear and has a hearing aide. .  They do not  have excessive sun exposure. Discussed the need for sun protection: hats, long sleeves and use of sunscreen if there is significant sun exposure.   Diet: the importance of a healthy diet is discussed. They do have a healthy diet.  The benefits of regular aerobic exercise were discussed.    Depression screen: there are no signs or vegative symptoms of depression- irritability, change in appetite, anhedonia, sadness/tearfullness.  Cognitive assessment: the patient manages all their financial and personal affairs and is actively engaged. They could relate day,date,year and events; recalled 2/3 objects at 3 minutes; performed clock-face test normally.  The following portions of the patient's history were reviewed  and updated as appropriate: allergies, current medications, past family history, past medical history,  past surgical history, past social history  and problem list.  Visual acuity was not assessed per patient preference since she has regular follow up with her ophthalmologist. Hearing and body mass index were assessed and reviewed.   During the course of the visit the patient was educated and counseled about appropriate screening and preventive services including : fall prevention , diabetes screening, nutrition counseling, colorectal cancer screening, and recommended immunizations.    CC: The primary encounter diagnosis was Impaired fasting glucose. Diagnoses of Hyperglycemia, Long-term use of high-risk medication, Hyperlipidemia LDL goal <100, Hypomagnesemia, Elevated PSA, less than 10 ng/ml, Essential hypertension, Hyperlipidemia with target low density lipoprotein (LDL) cholesterol less than 70 mg/dL, Prediabetes, Chronic atrial fibrillation (HCC), OSA on CPAP, Class 2 obesity without serious comorbidity with body mass index (BMI) of 35.0 to 35.9 in adult, unspecified obesity type, and Diarrhea following gastrointestinal surgery were also pertinent to this visit.  1) obesity:    Weight has been stable for the past year. 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 Plans to resume water aerobics   3) Recurrent Loose stools , occurring n the  Morning occurring for the past 3.5 years, since Chris Price cholecystectomy.  Has tried immodium but the  full dose causes constipation  3) OSA: Diagnosed by sleep study.  Chris Price is wearing Chris Price CPAP every night a minimum of 6 hours per night and notes improved daytime wakefulness and decreased fatigue .  Takes it travelling as well.    History Chris Price has a past medical history of Atrial fibrillation ([place]), Cardiomyopathy -nonischemic, CHF (congestive heart failure) ([place]), Chronic systolic heart failure ([place]) (08/25/2011), Coronary  artery disease (08/15/2011), Degenerative disk disease, Disc herniation, GERD (gastroesophageal reflux disease), Hyperlipidemia, Hypertension, ICD (implantable cardiac defibrillator) in place, IVCD (intraventricular conduction defect), Lumbar spinal stenosis, Neuromuscular disorder ([place]), Persistent atrial fibrillation ([place]) (11/07/2011), and Sleep apnea.   Chris Price has a past surgical history that includes Lumbar laminectomy; Cardioversion (08/2011); Hernia repair; ICD (09/08/2011); Back surgery; cooled thermotherapy (2007); Prostate biopsy (2013); Cardiac catheterization (08/15/2011); Cardiac catheterization (07/09/2013); Cholecystectomy (April 2015); implantable cardioverter defibrillator implant (09/08/2011); Colonoscopy with propofol (N/A, 07/30/2015); and ICD GENERATOR CHANGEOUT (N/A, 03/08/2017).   Chris Price family history includes Heart attack in Chris Price brother, father, and son.Chris Price reports that  has never smoked. Chris Price has never used smokeless tobacco. Chris Price reports that Chris Price does not drink alcohol or use drugs.  Outpatient Medications Prior to Visit  Medication Sig Dispense Refill  . atorvastatin (LIPITOR) 80 MG tablet TAKE 1 TABLET ONE TIME DAILY 90 tablet 3  . carvedilol (COREG) 12.5 MG tablet Take 1 tablet (12.5 mg total) by mouth 2 (two) times daily with a meal. 180 tablet 3  . diphenhydramine-acetaminophen (TYLENOL PM) 25-500 MG TABS tablet Take 2 tablets by mouth at bedtime as needed (sleep).    . finasteride (PROSCAR) 5 MG tablet Take 5 mg by mouth daily.    . furosemide (LASIX) 20 MG tablet Take 1 tablet (20 mg total) by mouth daily. 90 tablet 3  . lisinopril (PRINIVIL,ZESTRIL) 5 MG tablet Take 1 tablet (5 mg total) by mouth daily. 90 tablet 3  . loperamide (IMODIUM A-D) 2 MG tablet Take 2 mg by mouth as needed for diarrhea or loose stools.    . nitroGLYCERIN (NITROSTAT) 0.4 MG SL tablet Place 1 tablet (0.4 mg total) under the tongue every 5 (five) minutes as needed for chest pain. 25 tablet 3  . omeprazole (PRILOSEC)  20 MG capsule TAKE 1 CAPSULE 2 TIMES DAILY BEFORE A MEAL. FOR ACID REFLUX. (Patient taking differently: Take 20 mg by mouth 2 (two) times daily as needed (heartburn). ) 60 capsule 5  . rivaroxaban (XARELTO) 20 MG TABS tablet Take 1 tablet (20 mg total) by mouth daily with supper. 90 tablet 3  . spironolactone (ALDACTONE) 25 MG tablet Take 0.5 tablets (12.5 mg total) by mouth daily. 45 tablet 3  . Tamsulosin HCl (FLOMAX) 0.4 MG CAPS Take 0.4 mg by mouth daily.    . traMADol (ULTRAM) 50 MG tablet Take 1 tablet (50 mg total) by mouth 3 (three) times daily as needed. (Patient taking differently: Take 50 mg by mouth 3 (three) times daily as needed for moderate pain. ) 60 tablet 2  . traZODone (DESYREL) 50 MG tablet TAKE 1 TABLET AT BEDTIME (Patient taking differently: TAKE 50 mg  AT BEDTIME) 90 tablet 1  . Vitamin D, Ergocalciferol, (DRISDOL) 50000 units CAPS capsule TAKE 1 CAPSULE ONE TIME WEEKLY (Patient taking differently: TAKE 15176 units ONE TIME WEEKLY) 12 capsule 2   No facility-administered medications prior to visit.     Review of Systems   Patient denies headache, fevers, malaise, unintentional weight loss, skin rash, eye pain, sinus congestion and sinus pain, sore throat, dysphagia,  hemoptysis , cough, dyspnea, wheezing, chest pain, palpitations, orthopnea, edema, abdominal  pain, nausea, melena, diarrhea, constipation, flank pain, dysuria, hematuria, urinary  Frequency, nocturia, numbness, tingling, seizures,  Focal weakness, Loss of consciousness,  Tremor, insomnia, depression, anxiety, and suicidal ideation.      Objective:  BP 116/80 (BP Location: Left Arm, Patient Position: Sitting, Cuff Size: Normal)   Pulse 79   Temp (!) 97.4 F (36.3 C) (Oral)   Resp 15   Ht 5' 6.5" (1.689 m)   Wt 214 lb 9.6 oz (97.3 kg)   SpO2 93%   BMI 34.12 kg/m   Physical Exam   General appearance: alert, cooperative and appears stated age Ears: normal TM's and external ear canals both ears Throat:  lips, mucosa, and tongue normal; teeth and gums normal Neck: no adenopathy, no carotid bruit, supple, symmetrical, trachea midline and thyroid not enlarged, symmetric, no tenderness/mass/nodules Chest wall:  well healed surgical incicion left chest wall  Back: symmetric, no curvature. ROM normal. No CVA tenderness. Lungs: clear to auscultation bilaterally Heart: regular rate and rhythm, S1, S2 normal, no murmur, click, rub or gallop Abdomen: soft, non-tender; bowel sounds normal; no masses,  no organomegaly Pulses: 2+ and symmetric Skin: Skin color, texture, turgor normal. No rashes or lesions Lymph nodes: Cervical, supraclavicular, and axillary nodes normal.    Assessment & Plan:   Problem List Items Addressed This Visit    Elevated PSA, less than 10 ng/ml    Chris Price has regular follow up with Dr Jacqlyn Larsen.      Hypertension    Well controlled on current regimen. Renal function stable, no changes today.  Lab Results  Component Value Date   CREATININE 1.04 06/29/2017   Lab Results  Component Value Date   NA 141 06/29/2017   K 4.2 06/29/2017   CL 104 06/29/2017   CO2 25 06/29/2017         Hyperlipidemia with target low density lipoprotein (LDL) cholesterol less than 70 mg/dL    Chris Price is on a high intensity statin and calculated LDL is 110 despite 80 mg of Lipitor.  Lab Results  Component Value Date   LDLCALC 113 (H) 06/29/2017   Lab Results  Component Value Date   CHOL 179 06/29/2017   HDL 40.60 06/29/2017   LDLCALC 113 (H) 06/29/2017   LDLDIRECT 143.6 03/21/2012   TRIG 129.0 06/29/2017   CHOLHDL 4 06/29/2017   Lab Results  Component Value Date   ALT 22 06/29/2017   AST 24 06/29/2017   ALKPHOS 50 06/29/2017   BILITOT 1.0 06/29/2017         OSA on CPAP    Diagnosed by sleep study. Chris Price is wearing Chris Price CPAP every night a minimum of 6 hours per night and notes improved daytime wakefulness and decreased fatigue       Obesity    I have addressed  BMI and recommended wt  loss of 10% of body weigh over the next 6 months using a low glycemic index diet and regular exercise a minimum of 5 days per week.        Chronic atrial fibrillation [facility])    Managed with demand pacer, carvedilol for CAD  and xarelto.       Diarrhea following gastrointestinal surgery    Will recommend trial of  Cholestyramine       Long-term use of high-risk medication   Relevant Orders   Comprehensive metabolic panel (Completed)   CBC with Differential/Platelet (Completed)   Prediabetes     Fasting  glucose is elevated but not diagnostic of  diabetes; however Chris Price  a1c suggests that Chris Price is at risk for developing type 2 Diabetes. Recommending low glycemic index diet.       RESOLVED: Hyperglycemia      Fasting  glucose is elevated but not diagnostic of diabetes;    Low GI diet recommended.   Lab Results  Component Value Date   HGBA1C 5.8 06/29/2017         Relevant Orders   Hemoglobin A1c (Completed)   Microalbumin / creatinine urine ratio (Completed)   RESOLVED: Hypomagnesemia   Relevant Orders   Magnesium (Completed)    Other Visit Diagnoses    Impaired fasting glucose    -  Primary   Hyperlipidemia LDL goal <100       Relevant Orders   Lipid panel (Completed)     A total of 40 minutes was spent with patient more than half of which was spent in counseling patient on the above mentioned issues , reviewing and explaining recent labs and imaging studies done, and coordination of care.  I am having Kris Hartmann. O'Brien start on Zoster Vaccine Adjuvanted. I am also having him maintain Chris Price tamsulosin, finasteride, loperamide, omeprazole, traMADol, Vitamin D (Ergocalciferol), traZODone, diphenhydramine-acetaminophen, atorvastatin, rivaroxaban, carvedilol, lisinopril, furosemide, nitroGLYCERIN, and spironolactone.  Meds ordered this encounter  Medications  . Zoster Vaccine Adjuvanted [facility]) injection    Sig: Inject 0.5 mLs into the muscle once for 1 dose.    Dispense:  1  each    Refill:  1    There are no discontinued medications.  Follow-up: Return in about 6 months (around 12/27/2017).   Crecencio Mc, MD

## 2017-06-29 NOTE — Patient Instructions (Addendum)
For your diarrhea:  Try 1/2 of the usual dose of immodium once daily when you wake up  You can add a stool softener called Colace (docusate,  100 mg ) at night if you get constipated from the morning  Dose of immodium    You should reduce your daily intake of starches to help you lose weight   try a different  Breakfast : 2 scrambled eggs,  2 slices bacon or 1 sausage patty  [employer] now makes a frozen breakfast frittata  Or egg sanwhich  that can be microwaved in 2 minutes and is very low carb. Frittatas are similar to quiches without the crust   The ShingRx vaccine is now available in local pharmacies and is 97% protective against shingles .  It is therefore ADVISED for all interested adults over 50 to prevent shingles, but it is not covered by Medicare so I have given you a prescription for it to use at your local pharmacy     Health Maintenance, Male A healthy lifestyle and preventive care is important for your health and wellness. Ask your health care provider about what schedule of regular examinations is right for you. What should I know about weight and diet? Eat a Healthy Diet  Eat plenty of vegetables, fruits, whole grains, low-fat dairy products, and lean protein.  Do not eat a lot of foods high in solid fats, added sugars, or salt.  Maintain a Healthy Weight Regular exercise can help you achieve or maintain a healthy weight. You should:  Do at least 150 minutes of exercise each week. The exercise should increase your heart rate and make you sweat (moderate-intensity exercise).  Do strength-training exercises at least twice a week.  Watch Your Levels of Cholesterol and Blood Lipids  Have your blood tested for lipids and cholesterol every 5 years starting at 79 years of age. If you are at high risk for heart disease, you should start having your blood tested when you are 79 years old. You may need to have your cholesterol levels checked more often if: ? Your lipid or  cholesterol levels are high. ? You are older than 79 years of age. ? You are at high risk for heart disease.  What should I know about cancer screening? Many types of cancers can be detected early and may often be prevented. Lung Cancer  You should be screened every year for lung cancer if: ? You are a current smoker who has smoked for at least 30 years. ? You are a former smoker who has quit within the past 15 years.  Talk to your health care provider about your screening options, when you should start screening, and how often you should be screened.  Colorectal Cancer  Routine colorectal cancer screening usually begins at 79 years of age and should be repeated every 5-10 years until you are 79 years old. You may need to be screened more often if early forms of precancerous polyps or small growths are found. Your health care provider may recommend screening at an earlier age if you have risk factors for colon cancer.  Your health care provider may recommend using home test kits to check for hidden blood in the stool.  A small camera at the end of a tube can be used to examine your colon (sigmoidoscopy or colonoscopy). This checks for the earliest forms of colorectal cancer.  Prostate and Testicular Cancer  Depending on your age and overall health, your health care provider may  do certain tests to screen for prostate and testicular cancer.  Talk to your health care provider about any symptoms or concerns you have about testicular or prostate cancer.  Skin Cancer  Check your skin from head to toe regularly.  Tell your health care provider about any new moles or changes in moles, especially if: ? There is a change in a mole's size, shape, or color. ? You have a mole that is larger than a pencil eraser.  Always use sunscreen. Apply sunscreen liberally and repeat throughout the day.  Protect yourself by wearing long sleeves, pants, a wide-brimmed hat, and sunglasses when  outside.  What should I know about heart disease, diabetes, and high blood pressure?  If you are 52-26 years of age, have your blood pressure checked every 3-5 years. If you are 8 years of age or older, have your blood pressure checked every year. You should have your blood pressure measured twice-once when you are at a hospital or clinic, and once when you are not at a hospital or clinic. Record the average of the two measurements. To check your blood pressure when you are not at a hospital or clinic, you can use: ? An automated blood pressure machine at a pharmacy. ? A home blood pressure monitor.  Talk to your health care provider about your target blood pressure.  If you are between 4-36 years old, ask your health care provider if you should take aspirin to prevent heart disease.  Have regular diabetes screenings by checking your fasting blood sugar level. ? If you are at a normal weight and have a low risk for diabetes, have this test once every three years after the age of 25. ? If you are overweight and have a high risk for diabetes, consider being tested at a younger age or more often.  A one-time screening for abdominal aortic aneurysm (AAA) by ultrasound is recommended for men aged 54-75 years who are current or former smokers. What should I know about preventing infection? Hepatitis B If you have a higher risk for hepatitis B, you should be screened for this virus. Talk with your health care provider to find out if you are at risk for hepatitis B infection. Hepatitis C Blood testing is recommended for:  Everyone born from [birth year] through 1967.  Anyone with known risk factors for hepatitis C.  Sexually Transmitted Diseases (STDs)  You should be screened each year for STDs including gonorrhea and chlamydia if: ? You are sexually active and are younger than 79 years of age. ? You are older than 79 years of age and your health care provider tells you that you are at risk for this  type of infection. ? Your sexual activity has changed since you were last screened and you are at an increased risk for chlamydia or gonorrhea. Ask your health care provider if you are at risk.  Talk with your health care provider about whether you are at high risk of being infected with HIV. Your health care provider may recommend a prescription medicine to help prevent HIV infection.  What else can I do?  Schedule regular health, dental, and eye exams.  Stay current with your vaccines (immunizations).  Do not use any tobacco products, such as cigarettes, chewing tobacco, and e-cigarettes. If you need help quitting, ask your health care provider.  Limit alcohol intake to no more than 2 drinks per day. One drink equals 12 ounces of beer, 5 ounces of wine, or 1 ounces  of hard liquor.  Do not use street drugs.  Do not share needles.  Ask your health care provider for help if you need support or information about quitting drugs.  Tell your health care provider if you often feel depressed.  Tell your health care provider if you have ever been abused or do not feel safe at home. This information is not intended to replace advice given to you by your health care provider. Make sure you discuss any questions you have with your health care provider. Document Released: 10/22/2007 Document Revised: 12/23/2015 Document Reviewed: 01/27/2015 Elsevier Interactive Patient Education  [employer].

## 2017-07-01 DIAGNOSIS — R7303 Prediabetes: Secondary | ICD-10-CM | POA: Insufficient documentation

## 2017-07-01 NOTE — Assessment & Plan Note (Signed)
Fasting  glucose is elevated but not diagnostic of diabetes;    Low GI diet recommended.   Lab Results  Component Value Date   HGBA1C 5.8 06/29/2017

## 2017-07-01 NOTE — Assessment & Plan Note (Signed)
Managed with demand pacer, carvedilol for CAD  and xarelto.

## 2017-07-01 NOTE — Assessment & Plan Note (Signed)
Will recommend trial of  Cholestyramine

## 2017-07-01 NOTE — Assessment & Plan Note (Signed)
I have addressed  BMI and recommended wt loss of 10% of body weigh over the next 6 months using a low glycemic index diet and regular exercise a minimum of 5 days per week.   

## 2017-07-01 NOTE — Assessment & Plan Note (Signed)
He has regular follow up with Dr Jacqlyn Larsen.

## 2017-07-01 NOTE — Assessment & Plan Note (Signed)
Diagnosed by sleep study. he is wearing his CPAP every night a minimum of 6 hours per night and notes improved daytime wakefulness and decreased fatigue  

## 2017-07-01 NOTE — Assessment & Plan Note (Signed)
Fasting  glucose is elevated but not diagnostic of diabetes; however his  a1c suggests that he is at risk for developing type 2 Diabetes. Recommending low glycemic index diet.

## 2017-07-01 NOTE — Assessment & Plan Note (Signed)
Well controlled on current regimen. Renal function stable, no changes today.  Lab Results  Component Value Date   CREATININE 1.04 06/29/2017   Lab Results  Component Value Date   NA 141 06/29/2017   K 4.2 06/29/2017   CL 104 06/29/2017   CO2 25 06/29/2017

## 2017-07-01 NOTE — Assessment & Plan Note (Signed)
He is on a high intensity statin and calculated LDL is 110 despite 80 mg of Lipitor.  Lab Results  Component Value Date   LDLCALC 113 (H) 06/29/2017   Lab Results  Component Value Date   CHOL 179 06/29/2017   HDL 40.60 06/29/2017   LDLCALC 113 (H) 06/29/2017   LDLDIRECT 143.6 03/21/2012   TRIG 129.0 06/29/2017   CHOLHDL 4 06/29/2017   Lab Results  Component Value Date   ALT 22 06/29/2017   AST 24 06/29/2017   ALKPHOS 50 06/29/2017   BILITOT 1.0 06/29/2017

## 2017-07-10 ENCOUNTER — Telehealth: Payer: Self-pay | Admitting: Cardiovascular Disease

## 2017-07-10 NOTE — Telephone Encounter (Signed)
[facility] is calling in regards to CPAP supplies States they need paperwork to be completed and signed by Dr Fletcher Anon Will be re-faxing request and will call back in 5 business days if there has been no response [phone]

## 2017-07-18 NOTE — Telephone Encounter (Signed)
Patient has not had an office visit with Dr. Fletcher Anon since April 2016. He did not prescribe CPAP nor does he monitor it. Faxed this information back to their office. I have also called the office, [phone]. Notified Collie Siad at [facility] who will update the records.

## 2017-08-09 ENCOUNTER — Telehealth: Payer: Self-pay | Admitting: Internal Medicine

## 2017-08-09 NOTE — Telephone Encounter (Signed)
Fax received from [facility] [employer] for orders for the patient's C-PAP supplies for Dr. Caryl Comes to sign. I called and spoke with Jinny Blossom at [facility] and advised her that Dr. Caryl Comes does not manage this for the patient. I advised that orders came to Dr. Fletcher Anon in early March and the same conversation was had that our office does not do this for the patient.  Per Jinny Blossom, they had reached out to the patient on 08/01/17 and the patient's wife had advised to send the orders to Dr. Caryl Comes. I advised that the patient' PCP may handle this, but I am unsure. I gave Megan Dr. Lupita Dawn name as PCP- she states they did have her on file, but not at the PCP.  They will reach out to Dr. Lupita Dawn office.

## 2017-08-28 ENCOUNTER — Encounter: Payer: Self-pay | Admitting: Family Medicine

## 2017-08-28 ENCOUNTER — Ambulatory Visit (INDEPENDENT_AMBULATORY_CARE_PROVIDER_SITE_OTHER): Payer: PPO | Admitting: Family Medicine

## 2017-08-28 VITALS — BP 118/76 | HR 74 | Resp 18 | Ht 66.0 in | Wt 210.9 lb

## 2017-08-28 DIAGNOSIS — I10 Essential (primary) hypertension: Secondary | ICD-10-CM

## 2017-08-28 DIAGNOSIS — E785 Hyperlipidemia, unspecified: Secondary | ICD-10-CM

## 2017-08-28 DIAGNOSIS — F5101 Primary insomnia: Secondary | ICD-10-CM | POA: Diagnosis not present

## 2017-08-28 DIAGNOSIS — I482 Chronic atrial fibrillation, unspecified: Secondary | ICD-10-CM

## 2017-08-28 DIAGNOSIS — G250 Essential tremor: Secondary | ICD-10-CM

## 2017-08-28 DIAGNOSIS — R202 Paresthesia of skin: Secondary | ICD-10-CM | POA: Diagnosis not present

## 2017-08-28 DIAGNOSIS — H9193 Unspecified hearing loss, bilateral: Secondary | ICD-10-CM

## 2017-08-28 DIAGNOSIS — Z23 Encounter for immunization: Secondary | ICD-10-CM

## 2017-08-28 DIAGNOSIS — R7303 Prediabetes: Secondary | ICD-10-CM | POA: Diagnosis not present

## 2017-08-28 DIAGNOSIS — Z9989 Dependence on other enabling machines and devices: Secondary | ICD-10-CM

## 2017-08-28 DIAGNOSIS — G4733 Obstructive sleep apnea (adult) (pediatric): Secondary | ICD-10-CM

## 2017-08-28 DIAGNOSIS — I5022 Chronic systolic (congestive) heart failure: Secondary | ICD-10-CM

## 2017-08-28 LAB — B12 AND FOLATE PANEL
Folate: >24 ng/mL
Vitamin B-12: 600 pg/mL (ref 200–1100)

## 2017-08-28 MED ORDER — ROSUVASTATIN CALCIUM 40 MG PO TABS: 40.0000 mg | ORAL_TABLET | Freq: Every day | ORAL | 0 refills | Status: DC

## 2017-08-28 MED ORDER — ZOSTER VAC RECOMB ADJUVANTED 50 MCG/0.5ML IM SUSR: 0.5000 mL | Freq: Once | INTRAMUSCULAR | 1 refills | Status: AC

## 2017-08-28 MED ORDER — TRAZODONE HCL 50 MG PO TABS: 50.0000 mg | ORAL_TABLET | Freq: Every day | ORAL | 1 refills | Status: DC

## 2017-08-28 NOTE — Patient Instructions (Addendum)
Start Crestor 40 mg , and stop Lipitor  Take Trazodone in place of Tylenol PM You can still take Tylenol for pain at night.  [place]

## 2017-08-28 NOTE — Progress Notes (Signed)
Name: Chris Price   MRN: [id number]    DOB: [birth year]   Date:08/28/2017       Progress Note  Subjective  Chief Complaint  Chief Complaint  Patient presents with  . Establish Care    HPI  Chronic afib: he sees Dr. Jens Som, no palpitation or SOB  CAD/CHF : he is doing well, states no SOB, chest pain , orthopnea or lower extremity edema, he sees cardiologist, on spironolactone, beta-blocker, ace and Eliquis, LDL not at goal, on high dose statin we will change to Crestor 40 mg daily   OSA/CPAP: he is very compliant with CPAP, wears even on vacation, denies headache in am.   Pre-diabetes: last hgbA1C is at goal, continue life style modification.   HTN: feels dizzy in am's occasionally, no chest pain or palpitation  Paresthesia both feet: going on since 2007 after back surgery - Dr. Rennis Harding, but getting progressively worse. We will check for b12 deficiency and if normal we will start gabapentin   Insomnia: taking Tylenol PM, discussed risk and we will try Trazodone at night instead. Able to fall asleep but not stay asleep   Obesity: he is doing water aerobics a couple of months ago and is doing well, losing weight, trying to eat well.    Patient Active Problem List   Diagnosis Date Noted  . Prediabetes 07/01/2017  . Hx of colonic polyps 06/19/2016  . Long-term use of high-risk medication 06/19/2016  . Tubular adenoma of colon 09/07/2015  . Low back pain due to displacement of intervertebral disc 04/09/2015  . Diarrhea following gastrointestinal surgery 09/04/2013  . S/P laparoscopic cholecystectomy 08/20/2013  . Chronic atrial fibrillation ([place]) 07/15/2013  . Morbid obesity ([place]) 02/28/2013  . OSA on CPAP 01/03/2013  . Hyperlipidemia with target low density lipoprotein (LDL) cholesterol less than 70 mg/dL 03/22/2012  . ED (erectile dysfunction) of organic origin 01/23/2012  . Benign localized prostatic hyperplasia with lower urinary tract symptoms (LUTS) 01/23/2012  .  Bladder outlet obstruction 12/25/2011  . Essential tremor 12/22/2011  . Hypertension   . Hearing loss in right ear 09/20/2011  . Elevated PSA, less than 10 ng/ml 09/20/2011  . Automatic implantable cardioverter-defibrillator Medtronic dual-chamber 09/09/2011  . Chronic systolic heart failure ([place]) 08/25/2011  . IVCD (intraventricular conduction defect)   . Coronary artery disease 08/15/2011    Past Surgical History:  Procedure Laterality Date  . CARDIAC CATHETERIZATION  08/15/2011   [facility]   . CARDIAC CATHETERIZATION  07/09/2013   [facility]  . CARDIOVERSION  08/2011   [facility]  . CHOLECYSTECTOMY  April 2015  . COLONOSCOPY WITH PROPOFOL N/A 07/30/2015   Procedure: COLONOSCOPY WITH PROPOFOL;  Surgeon: Lollie Sails, MD;  Location: [facility] ENDOSCOPY;  Service: Endoscopy;  Laterality: N/A;  . cooled thermotherapy  8250   [place], complicated by incontinence  . HERNIA REPAIR    . ICD  09/08/2011  . ICD GENERATOR CHANGEOUT N/A 03/08/2017   Procedure: ICD GENERATOR CHANGEOUT;  Surgeon: [name], MD;  Location: [facility] LAB;  Service: Cardiovascular;  Laterality: N/A;  . IMPLANTABLE CARDIOVERTER DEFIBRILLATOR IMPLANT  09/08/2011   Procedure: IMPLANTABLE CARDIOVERTER DEFIBRILLATOR IMPLANT;  Surgeon: [name], MD;  Location: [facility] CATH LAB;  Service: Cardiovascular;;  . LUMBAR LAMINECTOMY     L2 through S1 with wide decompression of the thecal sac and nerve roots.  Marland Kitchen PROSTATE BIOPSY  2013    Family History  Problem Relation Age of Onset  . Heart attack Father   .  Heart attack Brother   . Heart attack Son   . Ovarian cancer Daughter     Social History   Socioeconomic History  . Marital status: Married    Spouse name: Not on file  . Number of children: 3  . Years of education: Not on file  . Highest education level: 12th grade  Occupational History  . 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 Financial resource strain: Not hard at all  . Food  insecurity:    Worry: Never true    Inability: Never true  . Transportation needs:    Medical: No    Non-medical: No  Tobacco Use  . Smoking status: Never Smoker  . Smokeless tobacco: Never Used  Substance and Sexual Activity  . Alcohol use: No  . Drug use: No  . Sexual activity: Not Currently  Lifestyle  . Physical activity:    Days per week: 3 days    Minutes per session: 60 min  . Stress: Not at all  Relationships  . Social connections:    Talks on phone: More than three times a week    Gets together: Three times a week    Attends religious service: Never    Active member of club or organization: No    Attends meetings of clubs or organizations: Never    Relationship status: Married  . Intimate partner violence:    Fear of current or ex partner: No    Emotionally abused: No    Physically abused: No    Forced sexual activity: No  Other Topics Concern  . Not on file  Social History Narrative   Second marriage, together since 84   He has 3 children from previous marriage and one step-daughter      Current Outpatient Medications:  .  atorvastatin (LIPITOR) 80 MG tablet, TAKE 1 TABLET ONE TIME DAILY, Disp: 90 tablet, Rfl: 3 .  carvedilol (COREG) 12.5 MG tablet, Take 1 tablet (12.5 mg total) by mouth 2 (two) times daily with a meal., Disp: 180 tablet, Rfl: 3 .  diphenhydramine-acetaminophen (TYLENOL PM) 25-500 MG TABS tablet, Take 2 tablets by mouth at bedtime as needed (sleep)., Disp: , Rfl:  .  finasteride (PROSCAR) 5 MG tablet, Take 5 mg by mouth daily., Disp: , Rfl:  .  furosemide (LASIX) 20 MG tablet, Take 1 tablet (20 mg total) by mouth daily., Disp: 90 tablet, Rfl: 3 .  lisinopril (PRINIVIL,ZESTRIL) 5 MG tablet, Take 1 tablet (5 mg total) by mouth daily., Disp: 90 tablet, Rfl: 3 .  multivitamin (ONE-A-DAY MEN'S) TABS tablet, Take 1 tablet by mouth daily., Disp: , Rfl:  .  nitroGLYCERIN (NITROSTAT) 0.4 MG SL tablet, Place 1 tablet (0.4 mg total) under the tongue  every 5 (five) minutes as needed for chest pain., Disp: 25 tablet, Rfl: 3 .  omeprazole (PRILOSEC) 20 MG capsule, TAKE 1 CAPSULE 2 TIMES DAILY BEFORE A MEAL. FOR ACID REFLUX. (Patient taking differently: Take 20 mg by mouth 2 (two) times daily as needed (heartburn). ), Disp: 60 capsule, Rfl: 5 .  rivaroxaban (XARELTO) 20 MG TABS tablet, Take 1 tablet (20 mg total) by mouth daily with supper., Disp: 90 tablet, Rfl: 3 .  spironolactone (ALDACTONE) 25 MG tablet, Take 0.5 tablets (12.5 mg total) by mouth daily., Disp: 45 tablet, Rfl: 3 .  Tamsulosin HCl (FLOMAX) 0.4 MG CAPS, Take 0.4 mg by mouth daily., Disp: , Rfl:  .  traMADol (ULTRAM)  50 MG tablet, Take 1 tablet (50 mg total) by mouth 3 (three) times daily as needed. (Patient taking differently: Take 50 mg by mouth 3 (three) times daily as needed for moderate pain. ), Disp: 60 tablet, Rfl: 2 .  vitamin C (ASCORBIC ACID) 500 MG tablet, Take 500 mg by mouth daily., Disp: , Rfl:  .  loperamide (IMODIUM A-D) 2 MG tablet, Take 2 mg by mouth as needed for diarrhea or loose stools., Disp: , Rfl:  .  traZODone (DESYREL) 50 MG tablet, TAKE 1 TABLET AT BEDTIME (Patient not taking: Reported on 08/28/2017), Disp: 90 tablet, Rfl: 1  Allergies  Allergen Reactions  . No Known Allergies      ROS  Constitutional: Negative for fever or weight change.  Respiratory: Negative for cough and shortness of breath.   Cardiovascular: Negative for chest pain or palpitations.  Gastrointestinal: Negative for abdominal pain, no bowel changes.  Musculoskeletal: Negative for gait problem or joint swelling.  Skin: Negative for rash.  Neurological: Positive  for intermittent dizziness but no headache.  No other specific complaints in a complete review of systems (except as listed in HPI above).  Objective  Vitals:   08/28/17 0848  BP: 118/76  Pulse: 74  Resp: 18  SpO2: 96%  Weight: 210 lb 14.4 oz (95.7 kg)  Height: 5\' 6"  (1.676 m)    Body mass index is 34.04  kg/m.  Physical Exam  Constitutional: Patient appears well-developed and well-nourished. Obese  No distress.  HEENT: head atraumatic, normocephalic, pupils equal and reactive to light,  neck supple, throat within normal limits Cardiovascular: Normal rate, regular rhythm and normal heart sounds.  No murmur heard. No BLE edema. Pulmonary/Chest: Effort normal and breath sounds normal. No respiratory distress. Abdominal: Soft.  There is no tenderness. Psychiatric: Patient has a normal mood and affect. behavior is normal. Judgment and thought content normal. Neurological : fine tremors with movement - chronic   Recent Results (from the past 2160 hour(s))  CUP PACEART INCLINIC DEVICE CHECK     Status: None   Collection Time: 06/06/17  4:25 PM  Result Value Ref Range   Date Time Interrogation Session [phone]    Pulse Generator Manufacturer MERM    Pulse Gen Model [id number] Evera MRI XT DR    Pulse Gen Serial Number [id number]    Clinic Name [facility]    Implantable Pulse Generator Type Implantable Cardiac Defibulator    Implantable Pulse Generator Implant Date [id number]    Implantable Lead Manufacturer [facility]    Implantable Lead Model 5076 CapSureFix Novus    Implantable Lead Serial Number [id number]    Implantable Lead Implant Date [id number]    Implantable Lead Location Detail 1 APPENDAGE    Implantable Lead Location [id number]    Implantable Lead Manufacturer [facility]    Implantable Lead Model 6935 Sprint Quattro Secure S    Implantable Lead Serial Number [id number]    Implantable Lead Implant Date [id number]    Implantable Lead Location Detail 1 APEX    Implantable Lead Location [id number]    Lead Channel Setting Sensing Sensitivity 0.3 mV   Lead Channel Setting Pacing Pulse Width 0.4 ms   Lead Channel Setting Pacing Amplitude 2.5 V   Lead Channel Impedance Value 494 ohm   Lead Channel Impedance Value 646 ohm   Lead Channel Impedance Value 456 ohm   Lead Channel Sensing Intrinsic Amplitude  8.125 mV   Lead Channel Pacing Threshold Amplitude 0.5 V   Lead Channel Pacing Threshold Pulse  Width 0.4 ms   HighPow Impedance 63 ohm   Battery Status OK    Battery Remaining Longevity 132 mo   Battery Voltage 3.13 V   Brady Statistic RA Percent Paced 0 %   Brady Statistic RV Percent Paced 1.36 %   Brady Statistic AP VP Percent 0 %   Brady Statistic AS VP Percent 0.33 %   Brady Statistic AP VS Percent 0 %   Brady Statistic AS VS Percent 99.67 %   Eval Rhythm AF/Vs 80s-110s   Lipid panel     Status: Abnormal   Collection Time: 06/29/17  9:28 AM  Result Value Ref Range   Cholesterol 179 0 - 200 mg/dL    Comment: ATP III Classification       Desirable:  < 200 mg/dL               Borderline High:  200 - 239 mg/dL          High:  > = 240 mg/dL   Triglycerides 129.0 0.0 - 149.0 mg/dL    Comment: Normal:  <150 mg/dLBorderline High:  150 - 199 mg/dL   HDL 40.60 >39.00 mg/dL   VLDL 25.8 0.0 - 40.0 mg/dL   LDL Cholesterol 113 (H) 0 - 99 mg/dL   Total CHOL/HDL Ratio 4     Comment:                Men          Women1/2 Average Risk     3.4          3.3Average Risk          5.0          4.42X Average Risk          9.6          7.13X Average Risk          15.0          11.0                       NonHDL 138.63     Comment: NOTE:  Non-HDL goal should be 30 mg/dL higher than patient's LDL goal (i.e. LDL goal of < 70 mg/dL, would have non-HDL goal of < 100 mg/dL)  Comprehensive metabolic panel     Status: None   Collection Time: 06/29/17  9:28 AM  Result Value Ref Range   Sodium 141 135 - 145 mEq/L   Potassium 4.2 3.5 - 5.1 mEq/L   Chloride 104 96 - 112 mEq/L   CO2 25 19 - 32 mEq/L   Glucose, Bld 98 70 - 99 mg/dL   BUN 19 6 - 23 mg/dL   Creatinine, Ser 1.04 0.40 - 1.50 mg/dL   Total Bilirubin 1.0 0.2 - 1.2 mg/dL   Alkaline Phosphatase 50 39 - 117 U/L   AST 24 0 - 37 U/L   ALT 22 0 - 53 U/L   Total Protein 7.6 6.0 - 8.3 g/dL   Albumin 4.3 3.5 - 5.2 g/dL   Calcium 9.7 8.4 - 10.5 mg/dL   GFR  73.33 >60.00 mL/min  CBC with Differential/Platelet     Status: None   Collection Time: 06/29/17  9:28 AM  Result Value Ref Range   WBC 7.3 4.0 - 10.5 K/uL   RBC 4.90 4.22 - 5.81 Mil/uL   Hemoglobin 16.4 13.0 - 17.0 g/dL   HCT 48.7 39.0 - 52.0 %   MCV  99.5 78.0 - 100.0 fl   MCHC 33.6 30.0 - 36.0 g/dL   RDW 13.9 11.5 - 15.5 %   Platelets 235.0 150.0 - 400.0 K/uL   Neutrophils Relative % 60.9 43.0 - 77.0 %   Lymphocytes Relative 26.2 12.0 - 46.0 %   Monocytes Relative 10.1 3.0 - 12.0 %   Eosinophils Relative 2.1 0.0 - 5.0 %   Basophils Relative 0.7 0.0 - 3.0 %   Neutro Abs 4.4 1.4 - 7.7 K/uL   Lymphs Abs 1.9 0.7 - 4.0 K/uL   Monocytes Absolute 0.7 0.1 - 1.0 K/uL   Eosinophils Absolute 0.2 0.0 - 0.7 K/uL   Basophils Absolute 0.0 0.0 - 0.1 K/uL  Hemoglobin A1c     Status: None   Collection Time: 06/29/17  9:28 AM  Result Value Ref Range   Hgb A1c MFr Bld 5.8 4.6 - 6.5 %    Comment: Glycemic Control Guidelines for People with Diabetes:Non Diabetic:  <6%Goal of Therapy: <7%Additional Action Suggested:  >8%   Microalbumin / creatinine urine ratio     Status: Abnormal   Collection Time: 06/29/17  9:28 AM  Result Value Ref Range   Microalb, Ur 2.1 (H) 0.0 - 1.9 mg/dL   Creatinine,U 145.7 mg/dL   Microalb Creat Ratio 1.4 0.0 - 30.0 mg/g  Magnesium     Status: None   Collection Time: 06/29/17  9:35 AM  Result Value Ref Range   Magnesium 1.9 1.5 - 2.5 mg/dL    PHQ2/9: Depression screen [facility] 2/9 08/28/2017 05/12/2017 05/12/2016 05/13/2015 02/27/2013  Decreased Interest 1 0 0 0 0  Down, Depressed, Hopeless 0 0 0 0 0  PHQ - 2 Score 1 0 0 0 0  Altered sleeping 1 - - - -  Tired, decreased energy 0 - - - -  Change in appetite 1 - - - -  Feeling bad or failure about yourself  0 - - - -  Trouble concentrating 0 - - - -  Moving slowly or fidgety/restless 0 - - - -  Suicidal thoughts 0 - - - -  PHQ-9 Score 3 - - - -  Difficult doing work/chores Somewhat difficult - - - -     Fall  Risk: Fall Risk  08/28/2017 05/12/2017 05/12/2016 05/13/2015 02/27/2013  Falls in the past year? No No No No No     Functional Status Survey: Is the patient deaf or have difficulty hearing?: Yes(Bilateral Ears) Does the patient have difficulty seeing, even when wearing glasses/contacts?: No Does the patient have difficulty concentrating, remembering, or making decisions?: Yes Does the patient have difficulty walking or climbing stairs?: No Does the patient have difficulty dressing or bathing?: No Does the patient have difficulty doing errands alone such as visiting a doctor's office or shopping?: No   Assessment & Plan  1. Chronic atrial fibrillation (HCC)  Sees Dr Rogene Houston  2. Essential hypertension  Towards low end of normal, he gets a little dizzy when he stands up in am, advised to discuss with Dr. Caryl Comes on his next visit, stay hydrated   3. Prediabetes  Discussed low carbohydrate diet  4. OSA on CPAP  Compliant   5. Morbid obesity ([place])  BMI above 30 with co-morbidities, discussed importance of life style modification   6. Chronic systolic heart failure (HCC)  Doing well, on medical management   7. Hyperlipidemia with target low density lipoprotein (LDL) cholesterol less than 70 mg/dL  Change from Lipitor to Crestor   - rosuvastatin (CRESTOR)  40 MG tablet; Take 1 tablet (40 mg total) by mouth daily.  Dispense: 90 tablet; Refill: 0  8. Essential tremor  Taking beta-blockers  9. Primary insomnia  - traZODone (DESYREL) 50 MG tablet; Take 1 tablet (50 mg total) by mouth at bedtime.  Dispense: 90 tablet; Refill: 1   10. Paresthesia of both feet  - B12 and Folate Panel If normal we will start him on gabapentin   11. Need for shingles vaccine  - Zoster Vaccine Adjuvanted [facility]) injection; Inject 0.5 mLs into the muscle once for 1 dose.  Dispense: 0.5 mL; Refill: 1   12. Bilateral hearing loss, unspecified hearing loss type  - Ambulatory referral to ENT

## 2017-09-04 ENCOUNTER — Telehealth: Payer: Self-pay | Admitting: Family Medicine

## 2017-09-04 NOTE — Telephone Encounter (Signed)
Pt is needing a refill on his Trazodone 50mg  1 tablet at bedtime . Wife does not think that you have filed this before but Dr Derrel Nip was the one that gave him this and they do not see him anymore. Says that he just seen you a few days ago. Pharm is [facility] .

## 2017-09-04 NOTE — Telephone Encounter (Signed)
Left a voicemail Trazodone 50 mg was sent to [facility] on 08/28/2017 and spoke with pharmacist this medication was already picked up. Patient is up on 08/29/17 at 5:45 p.m. With 90 pills. Patient should not need any pills and still has 1 refill of this medication.

## 2017-09-05 ENCOUNTER — Ambulatory Visit (INDEPENDENT_AMBULATORY_CARE_PROVIDER_SITE_OTHER): Payer: PPO | Admitting: *Deleted

## 2017-09-05 DIAGNOSIS — I428 Other cardiomyopathies: Secondary | ICD-10-CM | POA: Diagnosis not present

## 2017-09-05 DIAGNOSIS — I5022 Chronic systolic (congestive) heart failure: Secondary | ICD-10-CM

## 2017-09-06 ENCOUNTER — Encounter: Payer: Self-pay | Admitting: Cardiology

## 2017-09-06 LAB — CUP PACEART REMOTE DEVICE CHECK
Battery Remaining Longevity: 130 mo
Battery Voltage: 3.08 V
Brady Statistic AP VP Percent: 0 %
Brady Statistic AS VP Percent: 0.55 %
Brady Statistic RV Percent Paced: 1.56 %
Date Time Interrogation Session: 20190430160027
HighPow Impedance: 67 Ohm
Implantable Lead Location: 753859
Implantable Lead Model: 5076
Implantable Pulse Generator Implant Date: 20181031
Lead Channel Impedance Value: 494 Ohm
Lead Channel Sensing Intrinsic Amplitude: 7 mV
Lead Channel Sensing Intrinsic Amplitude: 7 mV
Lead Channel Setting Pacing Amplitude: 2.5 V
Lead Channel Setting Sensing Sensitivity: 0.3 mV
MDC IDC LEAD IMPLANT DT: 20130502
MDC IDC LEAD IMPLANT DT: 20130502
MDC IDC LEAD LOCATION: 753860
MDC IDC MSMT LEADCHNL RV IMPEDANCE VALUE: 437 Ohm
MDC IDC MSMT LEADCHNL RV IMPEDANCE VALUE: 646 Ohm
MDC IDC MSMT LEADCHNL RV PACING THRESHOLD AMPLITUDE: 0.5 V
MDC IDC MSMT LEADCHNL RV PACING THRESHOLD PULSEWIDTH: 0.4 ms
MDC IDC SET LEADCHNL RV PACING PULSEWIDTH: 0.4 ms
MDC IDC STAT BRADY AP VS PERCENT: 0 %
MDC IDC STAT BRADY AS VS PERCENT: 99.45 %
MDC IDC STAT BRADY RA PERCENT PACED: 0 %

## 2017-09-06 NOTE — Progress Notes (Signed)
Remote ICD transmission.   

## 2017-09-11 DIAGNOSIS — H903 Sensorineural hearing loss, bilateral: Secondary | ICD-10-CM | POA: Diagnosis not present

## 2017-09-12 ENCOUNTER — Encounter: Payer: Self-pay | Admitting: Family Medicine

## 2017-09-12 DIAGNOSIS — H905 Unspecified sensorineural hearing loss: Secondary | ICD-10-CM | POA: Insufficient documentation

## 2017-11-18 ENCOUNTER — Other Ambulatory Visit: Payer: Self-pay | Admitting: Family Medicine

## 2017-11-18 DIAGNOSIS — E785 Hyperlipidemia, unspecified: Secondary | ICD-10-CM

## 2017-12-05 ENCOUNTER — Ambulatory Visit (INDEPENDENT_AMBULATORY_CARE_PROVIDER_SITE_OTHER): Payer: PPO | Admitting: *Deleted

## 2017-12-05 DIAGNOSIS — I428 Other cardiomyopathies: Secondary | ICD-10-CM

## 2017-12-05 DIAGNOSIS — I5022 Chronic systolic (congestive) heart failure: Secondary | ICD-10-CM

## 2017-12-05 NOTE — Progress Notes (Signed)
Remote ICD transmission.   

## 2017-12-06 ENCOUNTER — Encounter: Payer: Self-pay | Admitting: Cardiology

## 2017-12-18 LAB — CUP PACEART REMOTE DEVICE CHECK
Brady Statistic AP VS Percent: 0 %
Brady Statistic AS VP Percent: 0.55 %
Brady Statistic RA Percent Paced: 0 %
Date Time Interrogation Session: 20190730041604
HighPow Impedance: 67 Ohm
Implantable Lead Implant Date: 20130502
Implantable Lead Implant Date: 20130502
Implantable Lead Location: 753860
Implantable Lead Model: 5076
Lead Channel Impedance Value: 608 Ohm
Lead Channel Pacing Threshold Amplitude: 0.5 V
Lead Channel Pacing Threshold Pulse Width: 0.4 ms
Lead Channel Sensing Intrinsic Amplitude: 6.625 mV
Lead Channel Setting Sensing Sensitivity: 0.3 mV
MDC IDC LEAD LOCATION: 753859
MDC IDC MSMT BATTERY REMAINING LONGEVITY: 128 mo
MDC IDC MSMT BATTERY VOLTAGE: 3.05 V
MDC IDC MSMT LEADCHNL RA IMPEDANCE VALUE: 494 Ohm
MDC IDC MSMT LEADCHNL RV IMPEDANCE VALUE: 437 Ohm
MDC IDC MSMT LEADCHNL RV SENSING INTR AMPL: 6.625 mV
MDC IDC PG IMPLANT DT: 20181031
MDC IDC SET LEADCHNL RV PACING AMPLITUDE: 2.5 V
MDC IDC SET LEADCHNL RV PACING PULSEWIDTH: 0.4 ms
MDC IDC STAT BRADY AP VP PERCENT: 0 %
MDC IDC STAT BRADY AS VS PERCENT: 99.45 %
MDC IDC STAT BRADY RV PERCENT PACED: 1.38 %

## 2018-01-05 DIAGNOSIS — J029 Acute pharyngitis, unspecified: Secondary | ICD-10-CM | POA: Diagnosis not present

## 2018-01-05 DIAGNOSIS — J069 Acute upper respiratory infection, unspecified: Secondary | ICD-10-CM | POA: Diagnosis not present

## 2018-02-08 ENCOUNTER — Encounter: Payer: Self-pay | Admitting: Family Medicine

## 2018-02-08 ENCOUNTER — Ambulatory Visit (INDEPENDENT_AMBULATORY_CARE_PROVIDER_SITE_OTHER): Payer: PPO | Admitting: Family Medicine

## 2018-02-08 VITALS — BP 108/70 | HR 73 | Temp 98.0°F | Resp 16 | Ht 66.0 in | Wt 216.7 lb

## 2018-02-08 DIAGNOSIS — E785 Hyperlipidemia, unspecified: Secondary | ICD-10-CM | POA: Diagnosis not present

## 2018-02-08 DIAGNOSIS — G4733 Obstructive sleep apnea (adult) (pediatric): Secondary | ICD-10-CM | POA: Diagnosis not present

## 2018-02-08 DIAGNOSIS — I5022 Chronic systolic (congestive) heart failure: Secondary | ICD-10-CM | POA: Diagnosis not present

## 2018-02-08 DIAGNOSIS — Z9989 Dependence on other enabling machines and devices: Secondary | ICD-10-CM | POA: Diagnosis not present

## 2018-02-08 DIAGNOSIS — F5101 Primary insomnia: Secondary | ICD-10-CM

## 2018-02-08 DIAGNOSIS — R7303 Prediabetes: Secondary | ICD-10-CM | POA: Diagnosis not present

## 2018-02-08 DIAGNOSIS — H9193 Unspecified hearing loss, bilateral: Secondary | ICD-10-CM | POA: Diagnosis not present

## 2018-02-08 DIAGNOSIS — I482 Chronic atrial fibrillation, unspecified: Secondary | ICD-10-CM

## 2018-02-08 DIAGNOSIS — J302 Other seasonal allergic rhinitis: Secondary | ICD-10-CM | POA: Diagnosis not present

## 2018-02-08 DIAGNOSIS — I10 Essential (primary) hypertension: Secondary | ICD-10-CM

## 2018-02-08 MED ORDER — LORATADINE 10 MG PO TABS: 10.0000 mg | ORAL_TABLET | Freq: Every day | ORAL | 1 refills | Status: DC | PRN

## 2018-02-08 MED ORDER — ROSUVASTATIN CALCIUM 40 MG PO TABS: 40.0000 mg | ORAL_TABLET | Freq: Every day | ORAL | 1 refills | Status: DC

## 2018-02-08 MED ORDER — TRAZODONE HCL 50 MG PO TABS: 75.0000 mg | ORAL_TABLET | Freq: Every day | ORAL | 1 refills | Status: DC

## 2018-02-08 MED ORDER — EZETIMIBE 10 MG PO TABS: 10.0000 mg | ORAL_TABLET | Freq: Every day | ORAL | 1 refills | Status: DC

## 2018-02-08 MED ORDER — AZELASTINE HCL 0.1 % NA SOLN: 1.0000 | Freq: Two times a day (BID) | NASAL | 2 refills | Status: DC

## 2018-02-08 NOTE — Patient Instructions (Signed)
Wait to fill Zetia ( ezetimide ) 10 mg until we get cholesterol results, goal is LDL below 70

## 2018-02-08 NOTE — Progress Notes (Signed)
Name: Chris Price   MRN: [id number]    DOB: 1940/08/28   Date:02/08/2018       Progress Note  Subjective  Chief Complaint  Chief Complaint  Patient presents with  . Follow-up  . Hypertension    Denies any symptoms  . Sleep Apnea  . Insomnia    Needs refill of medication  . Obesity  . Sinus Problem    Has been having issues with his sinus-congested when to a Urgent care was given antibiotic and nasal spray-symptoms are back    HPI  Chronic afib: he sees Dr. Jens Som, no palpitation or SOB, he has a pacemaker   CHF : he is doing well, states no SOB, chest pain , orthopnea or lower extremity edema, he sees cardiologist, on spironolactone, beta-blocker, ace and Eliquis, we changed to Crestor on his last visit and we will recheck lipid panel today if not at goal we will add Zetia ( rx already sent to pharmacy ) but can hold off before filling it.   OSA/CPAP: he is very compliant with CPAP, wears even on vacation, denies headache in am.   Pre-diabetes: last hgbA1C is at goal, continue life style modification, resume water aerobics   HTN: he denies dizziness lately, negative for chest pain or palpitation   Paresthesia both feet: going on since 2007 after back surgery - Dr. Rennis Harding, but getting progressively worse. Last b12 level was normal, discussed gabapentin but he does not want it at this time, since no pain, just numbness   Insomnia: he is taking trazodone 75 mg and has been sleeping well at this time, wears CPAP   Obesity: he gained some weight since last visit, he stopped going to the [facility] because of runny nose, but advised to go back  AR: he states that over 4 months he has noticed rhinorrhea, nasal congestion, post-nasal drainage, went to urgent care given nasal spray and antibiotics and chest congestion resolved, but continues to have sore throat and rhinorrhea.    Patient Active Problem List   Diagnosis Date Noted  . Hearing loss, sensorineural 09/12/2017  .  Prediabetes 07/01/2017  . Hx of colonic polyps 06/19/2016  . Long-term use of high-risk medication 06/19/2016  . Tubular adenoma of colon 09/07/2015  . Low back pain due to displacement of intervertebral disc 04/09/2015  . Diarrhea following gastrointestinal surgery 09/04/2013  . S/P laparoscopic cholecystectomy 08/20/2013  . Chronic atrial fibrillation 07/15/2013  . Morbid obesity ([place]) 02/28/2013  . OSA on CPAP 01/03/2013  . Hyperlipidemia with target low density lipoprotein (LDL) cholesterol less than 70 mg/dL 03/22/2012  . ED (erectile dysfunction) of organic origin 01/23/2012  . Benign localized prostatic hyperplasia with lower urinary tract symptoms (LUTS) 01/23/2012  . Bladder outlet obstruction 12/25/2011  . Essential tremor 12/22/2011  . Hypertension   . Hearing loss in right ear 09/20/2011  . Elevated PSA, less than 10 ng/ml 09/20/2011  . Automatic implantable cardioverter-defibrillator Medtronic dual-chamber 09/09/2011  . Chronic systolic heart failure ([place]) 08/25/2011  . IVCD (intraventricular conduction defect)   . Coronary artery disease 08/15/2011    Past Surgical History:  Procedure Laterality Date  . CARDIAC CATHETERIZATION  08/15/2011   [facility]   . CARDIAC CATHETERIZATION  07/09/2013   [facility]  . CARDIOVERSION  08/2011   [facility]  . CHOLECYSTECTOMY  April 2015  . COLONOSCOPY WITH PROPOFOL N/A 07/30/2015   Procedure: COLONOSCOPY WITH PROPOFOL;  Surgeon: Lollie Sails, MD;  Location: [facility] ENDOSCOPY;  Service: Endoscopy;  Laterality:  N/A;  . cooled thermotherapy  [id number]   [place], complicated by incontinence  . HERNIA REPAIR    . ICD  09/08/2011  . ICD GENERATOR CHANGEOUT N/A 03/08/2017   Procedure: ICD GENERATOR CHANGEOUT;  Surgeon: [name], MD;  Location: [place] CV LAB;  Service: Cardiovascular;  Laterality: N/A;  . IMPLANTABLE CARDIOVERTER DEFIBRILLATOR IMPLANT  09/08/2011   Procedure: IMPLANTABLE CARDIOVERTER DEFIBRILLATOR IMPLANT;  Surgeon: [name], MD;   Location: [facility] CATH LAB;  Service: Cardiovascular;;  . LUMBAR LAMINECTOMY     L2 through S1 with wide decompression of the thecal sac and nerve roots.  Marland Kitchen PROSTATE BIOPSY  2013    Family History  Problem Relation Age of Onset  . Heart attack Father   . Heart attack Brother   . Heart attack Son   . Ovarian cancer Daughter     Social History   Socioeconomic History  . Marital status: Married    Spouse name: Not on file  . Number of children: 3  . Years of education: Not on file  . Highest education level: 12th grade  Occupational History  . 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 Financial resource strain: Not hard at all  . Food insecurity:    Worry: Never true    Inability: Never true  . Transportation needs:    Medical: No    Non-medical: No  Tobacco Use  . Smoking status: Never Smoker  . Smokeless tobacco: Never Used  Substance and Sexual Activity  . Alcohol use: No  . Drug use: No  . Sexual activity: Not Currently  Lifestyle  . Physical activity:    Days per week: 3 days    Minutes per session: 60 min  . Stress: Not at all  Relationships  . Social connections:    Talks on phone: More than three times a week    Gets together: Three times a week    Attends religious service: Never    Active member of club or organization: No    Attends meetings of clubs or organizations: Never    Relationship status: Married  . Intimate partner violence:    Fear of current or ex partner: No    Emotionally abused: No    Physically abused: No    Forced sexual activity: No  Other Topics Concern  . Not on file  Social History Narrative   Second marriage, together since 69   He has 3 children from previous marriage and one step-daughter      Current Outpatient Medications:  .  azelastine (ASTELIN) 0.1 % nasal spray, Place 1 spray into both nostrils 2 (two) times daily., Disp: 30 mL, Rfl: 2 .  carvedilol (COREG) 12.5 MG tablet, Take 1 tablet  (12.5 mg total) by mouth 2 (two) times daily with a meal., Disp: 180 tablet, Rfl: 3 .  finasteride (PROSCAR) 5 MG tablet, Take 5 mg by mouth daily., Disp: , Rfl:  .  furosemide (LASIX) 20 MG tablet, Take 1 tablet (20 mg total) by mouth daily., Disp: 90 tablet, Rfl: 3 .  lisinopril (PRINIVIL,ZESTRIL) 5 MG tablet, Take 1 tablet (5 mg total) by mouth daily., Disp: 90 tablet, Rfl: 3 .  loperamide (IMODIUM A-D) 2 MG tablet, Take 2 mg by mouth as needed for diarrhea or loose stools., Disp: , Rfl:  .  multivitamin (ONE-A-DAY MEN'S) TABS tablet, Take 1 tablet by mouth daily., Disp: , Rfl:  .  nitroGLYCERIN (NITROSTAT) 0.4 MG SL tablet, Place 1 tablet (0.4 mg total) under the tongue every 5 (five) minutes as needed for chest pain., Disp: 25 tablet, Rfl: 3 .  omeprazole (PRILOSEC) 20 MG capsule, TAKE 1 CAPSULE 2 TIMES DAILY BEFORE A MEAL. FOR ACID REFLUX. (Patient taking differently: Take 20 mg by mouth 2 (two) times daily as needed (heartburn). ), Disp: 60 capsule, Rfl: 5 .  rivaroxaban (XARELTO) 20 MG TABS tablet, Take 1 tablet (20 mg total) by mouth daily with supper., Disp: 90 tablet, Rfl: 3 .  rosuvastatin (CRESTOR) 40 MG tablet, Take 1 tablet (40 mg total) by mouth daily., Disp: 90 tablet, Rfl: 1 .  spironolactone (ALDACTONE) 25 MG tablet, Take 0.5 tablets (12.5 mg total) by mouth daily., Disp: 45 tablet, Rfl: 3 .  Tamsulosin HCl (FLOMAX) 0.4 MG CAPS, Take 0.4 mg by mouth daily., Disp: , Rfl:  .  traMADol (ULTRAM) 50 MG tablet, Take 1 tablet (50 mg total) by mouth 3 (three) times daily as needed. (Patient taking differently: Take 50 mg by mouth 3 (three) times daily as needed for moderate pain. ), Disp: 60 tablet, Rfl: 2 .  traZODone (DESYREL) 50 MG tablet, Take 1.5 tablets (75 mg total) by mouth at bedtime., Disp: 135 tablet, Rfl: 1 .  vitamin C (ASCORBIC ACID) 500 MG tablet, Take 500 mg by mouth daily., Disp: , Rfl:  .  diphenhydramine-acetaminophen (TYLENOL PM) 25-500 MG TABS tablet, Take 2 tablets by  mouth at bedtime as needed (sleep)., Disp: , Rfl:  .  loratadine (CLARITIN) 10 MG tablet, Take 1 tablet (10 mg total) by mouth daily as needed for allergies., Disp: 90 tablet, Rfl: 1  Allergies  Allergen Reactions  . No Known Allergies     I personally reviewed active problem list, medication list, allergies, family history, social history with the patient/caregiver today.   ROS  Constitutional: Negative for fever or weight change.  Respiratory: Negative for cough and shortness of breath.   Cardiovascular: Negative for chest pain or palpitations.  Gastrointestinal: Negative for abdominal pain, no bowel changes.  Musculoskeletal: positive  For intermittent  gait problem but no  joint swelling.  Skin: Negative for rash.  Neurological: Negative for dizziness or headache.  No other specific complaints in a complete review of systems (except as listed in HPI above).  Objective  Vitals:   02/08/18 1005  BP: (!) 100/54  Pulse: 73  Resp: 16  Temp: 98 F (36.7 C)  TempSrc: Oral  SpO2: 97%  Weight: 216 lb 11.2 oz (98.3 kg)  Height: 5\' 6"  (1.676 m)    Body mass index is 34.98 kg/m.  Physical Exam  Constitutional: Patient appears well-developed and well-nourished. Obese  No distress.  HEENT: head atraumatic, normocephalic, pupils equal and reactive to light, ears TM  Normal, boggy turbinatesneck supple, throat within normal limits Cardiovascular: Normal rate, irregular rhythm and normal heart sounds.  No murmur heard. No BLE edema. Large varicose vein on right leg Pulmonary/Chest: Effort normal and breath sounds normal. No respiratory distress. Abdominal: Soft.  There is no tenderness. Psychiatric: Patient has a normal mood and affect. behavior is normal. Judgment and thought content normal.  Recent Results (from the past 2160 hour(s))  CUP PACEART REMOTE DEVICE CHECK     Status: None   Collection Time: 12/05/17  4:16 AM  Result Value Ref Range   Date Time Interrogation  Session [id number]    Pulse Generator Manufacturer MERM    Pulse Gen Model [id number] Evera MRI  XT DR    Pulse Gen Serial Number [id number]    Clinic Name [facility]    Implantable Pulse Generator Type Implantable Cardiac Defibulator    Implantable Pulse Generator Implant Date 73220254    Implantable Lead Manufacturer [facility]    Implantable Lead Model 5076 CapSureFix Novus    Implantable Lead Serial Number [id number]    Implantable Lead Implant Date 31517616    Implantable Lead Location Detail 1 APPENDAGE    Implantable Lead Location [phone]    Implantable Lead Manufacturer [facility]    Implantable Lead Model 6935 Sprint Quattro Secure S    Implantable Lead Serial Number [id number]    Implantable Lead Implant Date [id number]    Implantable Lead Location Detail 1 APEX    Implantable Lead Location [phone]    Lead Channel Setting Sensing Sensitivity 0.3 mV   Lead Channel Setting Pacing Pulse Width 0.4 ms   Lead Channel Setting Pacing Amplitude 2.5 V   Lead Channel Impedance Value 494 ohm   Lead Channel Impedance Value 608 ohm   Lead Channel Impedance Value 437 ohm   Lead Channel Sensing Intrinsic Amplitude 6.625 mV   Lead Channel Sensing Intrinsic Amplitude 6.625 mV   Lead Channel Pacing Threshold Amplitude 0.5 V   Lead Channel Pacing Threshold Pulse Width 0.4 ms   HighPow Impedance 67 ohm   Battery Status OK    Battery Remaining Longevity 128 mo   Battery Voltage 3.05 V   Brady Statistic RA Percent Paced 0 %   Brady Statistic RV Percent Paced 1.38 %   Brady Statistic AP VP Percent 0 %   Brady Statistic AS VP Percent 0.55 %   Brady Statistic AP VS Percent 0 %   Brady Statistic AS VS Percent 99.45 %   Eval Rhythm AF/Vs       PHQ2/9: Depression screen [facility] 2/9 02/08/2018 08/28/2017 05/12/2017 05/12/2016 05/13/2015  Decreased Interest 0 1 0 0 0  Down, Depressed, Hopeless 0 0 0 0 0  PHQ - 2 Score 0 1 0 0 0  Altered sleeping - 1 - - -  Tired, decreased energy - 0 - - -  Change in appetite  - 1 - - -  Feeling bad or failure about yourself  - 0 - - -  Trouble concentrating - 0 - - -  Moving slowly or fidgety/restless - 0 - - -  Suicidal thoughts - 0 - - -  PHQ-9 Score - 3 - - -  Difficult doing work/chores - Somewhat difficult - - -     Fall Risk: Fall Risk  02/08/2018 08/28/2017 05/12/2017 05/12/2016 05/13/2015  Falls in the past year? No No No No No    Functional Status Survey: Is the patient deaf or have difficulty hearing?: Yes Does the patient have difficulty seeing, even when wearing glasses/contacts?: Yes Does the patient have difficulty concentrating, remembering, or making decisions?: No Does the patient have difficulty walking or climbing stairs?: No Does the patient have difficulty dressing or bathing?: No Does the patient have difficulty doing errands alone such as visiting a doctor's office or shopping?: No   Assessment & Plan  1. Dyslipidemia  - rosuvastatin (CRESTOR) 40 MG tablet; Take 1 tablet (40 mg total) by mouth daily.  Dispense: 90 tablet; Refill: 1  we will add Zetia today 10 mg daily    2. Primary insomnia  - traZODone (DESYREL) 50 MG tablet; Take 1.5 tablets (75 mg total) by mouth at bedtime.  Dispense: 135 tablet; Refill:  1  3. OSA on CPAP   4. Essential hypertension  bp is low and I will forward notes to Dr. Caryl Comes and see if bp medication can be adjusted   5. Chronic atrial fibrillation  On Xarelto   6. Morbid obesity (HCC)  BMI of almost 35 with co-morbidities  7. Chronic systolic heart failure (HCC)  No orthopnea  8. Prediabetes  On life style modification   9. Seasonal allergic rhinitis, unspecified trigger  - azelastine (ASTELIN) 0.1 % nasal spray; Place 1 spray into both nostrils 2 (two) times daily.  Dispense: 30 mL; Refill: 2 - loratadine (CLARITIN) 10 MG tablet; Take 1 tablet (10 mg total) by mouth daily as needed for allergies.  Dispense: 90 tablet; Refill: 1  10. Bilateral hearing loss, unspecified hearing loss  type

## 2018-02-09 LAB — LIPID PANEL
CHOL/HDL RATIO: 4.2 (calc) (ref <5.0)
CHOLESTEROL: 185 mg/dL (ref <200)
HDL: 44 mg/dL (ref >40)
LDL CHOLESTEROL (CALC): 119 mg/dL — AB
Non-HDL Cholesterol (Calc): 141 mg/dL (calc) — ABNORMAL HIGH (ref <130)
Triglycerides: 109 mg/dL (ref <150)

## 2018-02-09 LAB — COMPLETE METABOLIC PANEL WITH GFR
AG Ratio: 1.6 (calc) (ref 1.0–2.5)
ALT: 22 U/L (ref 9–46)
AST: 28 U/L (ref 10–35)
Albumin: 4.6 g/dL (ref 3.6–5.1)
Alkaline phosphatase (APISO): 48 U/L (ref 40–115)
BUN: 17 mg/dL (ref 7–25)
CO2: 26 mmol/L (ref 20–32)
CREATININE: 1.01 mg/dL (ref 0.70–1.18)
Calcium: 9 mg/dL (ref 8.6–10.3)
Chloride: 100 mmol/L (ref 98–110)
GFR, EST AFRICAN AMERICAN: 82 mL/min/{1.73_m2} (ref >=60)
GFR, EST NON AFRICAN AMERICAN: 70 mL/min/{1.73_m2} (ref >=60)
GLOBULIN: 2.8 g/dL (ref 1.9–3.7)
Glucose, Bld: 86 mg/dL (ref 65–139)
Potassium: 4.3 mmol/L (ref 3.5–5.3)
Sodium: 139 mmol/L (ref 135–146)
Total Bilirubin: 0.8 mg/dL (ref 0.2–1.2)
Total Protein: 7.4 g/dL (ref 6.1–8.1)

## 2018-03-06 ENCOUNTER — Ambulatory Visit (INDEPENDENT_AMBULATORY_CARE_PROVIDER_SITE_OTHER): Payer: PPO | Admitting: *Deleted

## 2018-03-06 DIAGNOSIS — I5022 Chronic systolic (congestive) heart failure: Secondary | ICD-10-CM

## 2018-03-06 DIAGNOSIS — I428 Other cardiomyopathies: Secondary | ICD-10-CM

## 2018-03-06 NOTE — Progress Notes (Signed)
Remote ICD transmission.   

## 2018-03-12 ENCOUNTER — Telehealth: Payer: Self-pay | Admitting: Internal Medicine

## 2018-03-12 ENCOUNTER — Ambulatory Visit: Payer: PPO

## 2018-03-12 VITALS — BP 100/58 | HR 75 | Resp 16

## 2018-03-12 DIAGNOSIS — I10 Essential (primary) hypertension: Secondary | ICD-10-CM

## 2018-03-12 DIAGNOSIS — I428 Other cardiomyopathies: Secondary | ICD-10-CM

## 2018-03-12 MED ORDER — CARVEDILOL 12.5 MG PO TABS: 6.2500 mg | ORAL_TABLET | Freq: Two times a day (BID) | ORAL | 3 refills | Status: DC

## 2018-03-12 NOTE — Telephone Encounter (Signed)
I spoke with the patient and his wife. They are aware of Thurmond Butts, PA's recommendations to decrease coreg to 6.25 mg BID. They do not have a way to monitor the patient's BP at home. They are agreeable to checking this at [facility] 3-4 times over the next week and call back with these readings in a weeks time.

## 2018-03-12 NOTE — Progress Notes (Signed)
Patient is here for a blood pressure check. Patient denies chest pain, palpitations, or visual disturbances. At previous visit blood pressure was 108/70 with a heart rate of 73. Today during nurse visit first check blood pressure was 104/62. After resting for 10 minutes it was 100/58 and heart rate was 75. He does take any blood pressure medications.  Patient is having SOB with exertion, lightheadedness when he stands up to quickly.    Last note from 02/08/2018:  Essential hypertension  bp is low and I will forward notes to Dr. Caryl Comes and see if bp medication can be adjusted

## 2018-03-12 NOTE — Telephone Encounter (Signed)
I called and spoke with the patient and his wife.  The patient states he saw his PCP, Dr. [name], about a month ago and his BP was running low- SBP ~ 100. He went in for a follow up BP (HR) check with the nurse this morning at his PCP office and it was 100/58 (75).  Per the patient, the only medication change Dr. [name] has made was to add Zetia 10 mg once daily. He was advised to follow up with our office for his low BP.  Confirmed with the patient that he does not check his BP at home. He has been having some dizziness for about a month & SOB for about 2 weeks with walking up stairs. His weight is stable at 212 lbs. He denies any lower extremity/ abdominal swelling. He states that he is staying hydrated.  Also confirmed the patient's medications with his wife (per his request). He is currently taking: - coreg 12.5 mg BID - proscar 5 mg QD - lasix 20 mg QD - lisinopril 5 mg QD - spironolactone 12.5 mg QD - flomax 0.4 mg QD  I advised the patient's wife that we may be able to cut back on his Coreg dose to see how his BP responds. They are aware I will review with the PA in Dr. Olin Pia absence and call them back. The patient and his wife are agreeable.

## 2018-03-12 NOTE — Telephone Encounter (Signed)
Agree with RN note.  Given soft systolic BP and dizziness which have patient decrease carvedilol to 6.25 mg twice daily.  Follow-up with primary cardiologist.

## 2018-03-12 NOTE — Telephone Encounter (Signed)
Patient came by office   Pt c/o BP issue: STAT if pt c/o blurred vision, one-sided weakness or slurred speech  1. What are your last 5 BP readings? 100/58   2. Are you having any other symptoms (ex. Dizziness, headache, blurred vision, passed out)? Dizziness when standing up, slight SOB  3. What is your BP issue? Dr. [name], PCP office, sent patient over concerned about blood pressure being low

## 2018-03-14 ENCOUNTER — Ambulatory Visit (INDEPENDENT_AMBULATORY_CARE_PROVIDER_SITE_OTHER): Payer: PPO

## 2018-03-14 VITALS — BP 100/70 | HR 61

## 2018-03-14 DIAGNOSIS — I10 Essential (primary) hypertension: Secondary | ICD-10-CM

## 2018-03-14 NOTE — Progress Notes (Signed)
Spoke with patient and his wife about stopping proscar for a week and then come back in 1 week to recheck BP per Dr. [name].

## 2018-03-14 NOTE — Progress Notes (Addendum)
Patient is here for a blood pressure check. Patient denies chest pain, palpitations, shortness of breath or visual disturbances. At previous visit blood pressure was 100/ 58 with a heart rate of 75. Today during nurse visit first check blood pressure was 100/70 with a heart rate of 61. He has not missed any doses of his medications. He will return again on Friday for blood pressure check and again on Monday.  Can he stop taking proscar to see if bp improves?

## 2018-03-16 ENCOUNTER — Ambulatory Visit (INDEPENDENT_AMBULATORY_CARE_PROVIDER_SITE_OTHER): Payer: PPO

## 2018-03-16 VITALS — BP 100/64 | HR 74 | Temp 98.0°F | Resp 16 | Ht 66.0 in | Wt 215.3 lb

## 2018-03-16 DIAGNOSIS — I10 Essential (primary) hypertension: Secondary | ICD-10-CM

## 2018-03-16 NOTE — Patient Instructions (Addendum)
Patient is here for a blood pressure check. Patient denies chest pain, palpitations, shortness of breath or visual disturbances, but has had some dizziness. At previous visit blood pressure was 108/70 with a heart rate of 73. Today during nurse visit first check blood pressure was 102/64 with a heart rate of 77. After resting for 10 minutes it was 100/64 and heart rate was 74. Patient stated that Dr. Caryl Comes took him off of the Flomax and cut his Carvedilol in 1/2.  After consulting with Dr. [name], patient was strongly encouraged to f/u with Dr. Olin Pia office sooner than January and to return here in 1 week.  Patient was told to go to the nearest ER if sx worsens.  Patient and wife expressed verbal understanding and said ok.

## 2018-03-19 ENCOUNTER — Ambulatory Visit (INDEPENDENT_AMBULATORY_CARE_PROVIDER_SITE_OTHER): Payer: PPO

## 2018-03-19 VITALS — BP 98/60 | HR 66

## 2018-03-19 DIAGNOSIS — I428 Other cardiomyopathies: Secondary | ICD-10-CM

## 2018-03-19 DIAGNOSIS — I959 Hypotension, unspecified: Secondary | ICD-10-CM

## 2018-03-19 DIAGNOSIS — I10 Essential (primary) hypertension: Secondary | ICD-10-CM

## 2018-03-19 NOTE — Progress Notes (Signed)
Patient is here for a blood pressure check. Patient denies chest pain, palpitations, shortness of breath or visual disturbances. He reports that he does get lightheaded when he stands up. At previous visit blood pressure was 100/64 with a heart rate of 74. Today during nurse visit first check blood pressure was 98/60 with a heart rate of 66.  He does take any blood pressure medication as prescribed with no missed doses.we will stop spirolactone and decrease lisinopril to 2.5mg .  We will have him see cardio Dr. Caryl Comes this week.

## 2018-03-19 NOTE — Patient Instructions (Signed)
Blood pressure today is 98/60 pulse 66, blood pressure is a little lower today than Friday.  We will stop the Spirolactone, and decrease the Lisinopril to half a pill which is 2.5mg .  Continue the Carvedilol half a pill twice daily for a mg of 6.25.  We have also placed a referral with Dr. Caryl Comes to get you in to see him this week, so be expecting a call to get that schedule.  Please call our office if you have not heard anything from them by Thursday and call us for any questions or concerns.  Thanks

## 2018-03-21 ENCOUNTER — Ambulatory Visit (INDEPENDENT_AMBULATORY_CARE_PROVIDER_SITE_OTHER): Payer: PPO

## 2018-03-21 VITALS — BP 110/70 | HR 64

## 2018-03-21 DIAGNOSIS — I959 Hypotension, unspecified: Secondary | ICD-10-CM

## 2018-03-21 NOTE — Progress Notes (Signed)
Patient is here for a blood pressure check. Patient denies chest pain, palpitations or visual disturbances. He does have some sob with exertion.  At previous visit blood pressure was 98/60 with a heart rate of 66. Today during nurse visit first check blood pressure was 118/70 with a heart rate of 73. After resting for 5 minutes it was 110/70 and heart rate was 64. He does take  blood pressure medications as prescribed with no missed doses. He has an appointment scheduled tomorrow at Cardiology with Doristine Mango, [place].

## 2018-03-21 NOTE — Telephone Encounter (Signed)
error 

## 2018-03-22 DIAGNOSIS — I10 Essential (primary) hypertension: Secondary | ICD-10-CM | POA: Diagnosis not present

## 2018-03-22 DIAGNOSIS — E785 Hyperlipidemia, unspecified: Secondary | ICD-10-CM | POA: Diagnosis not present

## 2018-03-22 DIAGNOSIS — I429 Cardiomyopathy, unspecified: Secondary | ICD-10-CM | POA: Diagnosis not present

## 2018-03-22 DIAGNOSIS — I482 Chronic atrial fibrillation, unspecified: Secondary | ICD-10-CM | POA: Diagnosis not present

## 2018-03-22 DIAGNOSIS — I5022 Chronic systolic (congestive) heart failure: Secondary | ICD-10-CM | POA: Diagnosis not present

## 2018-03-22 DIAGNOSIS — G4733 Obstructive sleep apnea (adult) (pediatric): Secondary | ICD-10-CM | POA: Diagnosis not present

## 2018-03-27 ENCOUNTER — Ambulatory Visit: Payer: PPO

## 2018-03-27 VITALS — BP 112/74 | HR 71

## 2018-03-27 DIAGNOSIS — I10 Essential (primary) hypertension: Secondary | ICD-10-CM

## 2018-03-27 NOTE — Progress Notes (Signed)
Patient is here for a blood pressure check. Patient denies chest pain, palpitations, shortness of breath or visual disturbances. At previous visit blood pressure was 110/70 with a heart rate of 64. Today during nurse visit first check blood pressure was 120/74. After resting for 10 minutes it was 112/74 and heart rate was 71. He does take any blood pressure medications.  States he went to [facility] yesterday and BP was registering high 150/92-after a long walk from the parking garage. He came in concerned about it and wanted his Blood Pressure checked. Bonnita Nasuti got 120/74 on his left arm and Kasey check his BP on his right arm 10 minutes later and got 112/74.

## 2018-04-04 ENCOUNTER — Ambulatory Visit: Payer: PPO | Admitting: Urology

## 2018-04-04 ENCOUNTER — Other Ambulatory Visit: Payer: Self-pay

## 2018-04-04 ENCOUNTER — Encounter: Payer: Self-pay | Admitting: Urology

## 2018-04-04 VITALS — BP 113/73 | HR 101 | Wt 218.0 lb

## 2018-04-04 DIAGNOSIS — N401 Enlarged prostate with lower urinary tract symptoms: Secondary | ICD-10-CM | POA: Diagnosis not present

## 2018-04-04 DIAGNOSIS — N5203 Combined arterial insufficiency and corporo-venous occlusive erectile dysfunction: Secondary | ICD-10-CM

## 2018-04-04 LAB — BLADDER SCAN AMB NON-IMAGING

## 2018-04-04 NOTE — Progress Notes (Signed)
04/04/2018 9:34 AM   Chris Price 21-Jun-1940 [id number]  Referring provider: [name], MD [address] [place], [place] [address]  Chief Complaint  Patient presents with  . Erectile Dysfunction    HPI: Chris Price is a 77 y.o. male with a history of elevated PSA and BPH with LUTS. He was previously followed by Dr. Edrick Oh.  BPH w/ LUTS His history notes incomplete bladder emptying and urinary retention. Surgical history includes a prostate biopsy (2013) and TURP (2013). Pt denies episodes of urinary retention following his TURP.    Most recent PSA 1.05 (09/28/2016). Most recent PVR 15 mL with post void residual (09/28/2016).  He has been on chronic flomax and finasteride for numerous year.  He report that he never stopped these meds after TURP.    More recently, the patient was taken off of his ? Finasteride due to orthostasis last month.  He reports stopping a "blue pill."    Pt denies difficulty emptying, hematuria, dysuria, UTIs and other associated symptoms.   He is currently on finasteride.  IPSS as below  ED Pt reports reduced libido as well as difficulty achieving and maintaining erections.   He presents today with his wife with complaints of erectile disfunction.  IPSS    Row Name 04/04/18 0800         International Prostate Symptom Score   How often have you had the sensation of not emptying your bladder?  Less than 1 in 5     How often have you had to urinate less than every two hours?  Less than half the time     How often have you found you stopped and started again several times when you urinated?  About half the time     How often have you found it difficult to postpone urination?  Less than half the time     How often have you had a weak urinary stream?  About half the time     How often have you had to strain to start urination?  Less than half the time     How many times did you typically get up at night to urinate?  1  Time     Total IPSS Score  14       Quality of Life due to urinary symptoms   If you were to spend the rest of your life with your urinary condition just the way it is now how would you feel about that?  Mostly Satisfied        Score:  1-7 Mild 8-19 Moderate 20-35 Severe   PMH: Past Medical History:  Diagnosis Date  . Atrial fibrillation ([place])   . Cardiomyopathy -nonischemic    moderate to severe EF 25-30% March 2013  . CHF (congestive heart failure) ([place])   . Chronic systolic heart failure ([place]) 08/25/2011  . Coronary artery disease 08/15/2011   Moderate nonobstructive CAD with 60% mid LAD, 60% proximal RCA and 30 % stenossis left circumflex. Cardiac catheterization in March of 2015 showed no significant change   . Degenerative disk disease    lumbar  . Disc herniation   . GERD (gastroesophageal reflux disease)   . Hyperlipidemia   . Hypertension   . ICD (implantable cardiac defibrillator) in place   . IVCD (intraventricular conduction defect)    Nonspecific QRS duration 122  . Lumbar spinal stenosis   . Neuromuscular disorder ([place])    neauropathy   / siatica  .  Persistent atrial fibrillation 11/07/2011  . Sleep apnea    uses cpap    Surgical History: Past Surgical History:  Procedure Laterality Date  . CARD[facility]AC CATHETERIZATION  [facility]15/2011   [facility]   . CARDIAC CATHETERIZATION  07/09/2013   [facility]  . CARDIOVERSION  08/2011   [facility]  . CHOLECYSTECTOMY  April 2015  . COLONOSCOPY WITH PROPOFOL N/A 07/30/2015   Procedure: COLONOSCOPY WITH PROPOFOL;  Surgeon: Lollie Sails, MD;  Location: [facility] ENDOSCOPY;  Service: Endoscopy;  Laterality: N/A;  . cooled thermotherapy  3419   [place], complicated by incontinence  . HERNIA REPAIR    . ICD  09/08/2011  . ICD GENERATOR CHANGEOUT N/A 03/08/2017   Procedure: ICD GENERATOR CHANGEOUT;  Surgeon: [name], MD;  Location: [facility] CV LAB;  Service: Cardiovascular;  Laterality: N/A;  . IMPLANTABLE CARDIOVERTER DEFIBRILLATOR IMPLANT   09/08/2011   Procedure: IMPLANTABLE CARDIOVERTER DEFIBRILLATOR IMPLANT;  Surgeon: [name], MD;  Location: [facility] CATH LAB;  Service: Cardiovascular;;  . LUMBAR LAMINECTOMY     L2 through S1 with wide decompression of the thecal sac and nerve roots.  Marland Kitchen PROSTATE BIOPSY  2013    Home Medications:  Allergies as of 04/04/2018      Reactions   Coreg [carvedilol] Other (See Comments)   hypotension   Flomax [tamsulosin Hcl] Other (See Comments)   Hypotension      Medication List        Accurate as of 04/04/18  9:34 AM. Always use your most recent med list.          azelastine 0.1 % nasal spray Commonly known as:  ASTELIN Place 1 spray into both nostrils 2 (two) times daily.   diphenhydramine-acetaminophen 25-500 MG Tabs tablet Commonly known as:  TYLENOL PM Take 2 tablets by mouth at bedtime as needed (sleep).   ezetimibe 10 MG tablet Commonly known as:  ZETIA Take 1 tablet (10 mg total) by mouth daily.   furosemide 20 MG tablet Commonly known as:  LASIX Take 1 tablet (20 mg total) by mouth daily.   lisinopril 5 MG tablet Commonly known as:  PRINIVIL,ZESTRIL Take 1 tablet (5 mg total) by mouth daily.   loperamide 2 MG tablet Commonly known as:  IMODIUM A-D Take 2 mg by mouth as needed for diarrhea or loose stools.   loratadine 10 MG tablet Commonly known as:  CLARITIN Take 1 tablet (10 mg total) by mouth daily as needed for allergies.   multivitamin Tabs tablet Take 1 tablet by mouth daily.   nitroGLYCERIN 0.4 MG SL tablet Commonly known as:  NITROSTAT Place 1 tablet (0.4 mg total) under the tongue every 5 (five) minutes as needed for chest pain.   omeprazole 20 MG capsule Commonly known as:  PRILOSEC TAKE 1 CAPSULE 2 TIMES DAILY BEFORE A MEAL. FOR ACID REFLUX.   rivaroxaban 20 MG Tabs tablet Commonly known as:  XARELTO Take 1 tablet (20 mg total) by mouth daily with supper.   rosuvastatin 40 MG tablet Commonly known as:  CRESTOR Take 1 tablet (40 mg  total) by mouth daily.   traMADol 50 MG tablet Commonly known as:  ULTRAM Take 1 tablet (50 mg total) by mouth 3 (three) times daily as needed.   traZODone 50 MG tablet Commonly known as:  DESYREL Take 1.5 tablets (75 mg total) by mouth at bedtime.   vitamin C 500 MG tablet Commonly known as:  ASCORBIC ACID Take 500 mg by mouth daily.  Allergies:  Allergies  Allergen Reactions  . Coreg [Carvedilol] Other (See Comments)    hypotension  . Flomax [Tamsulosin Hcl] Other (See Comments)    Hypotension    Family History: Family History  Problem Relation Age of Onset  . Heart attack Father   . Heart attack Brother   . Heart attack Son   . Ovarian cancer Daughter     Social History:  reports that he has never smoked. He has never used smokeless tobacco. He reports that he does not drink alcohol or use drugs.  ROS: UROLOGY Frequent Urination?: No Hard to postpone urination?: No Burning/pain with urination?: No Get up at night to urinate?: No Leakage of urine?: No Urine stream starts and stops?: No Trouble starting stream?: No Do you have to strain to urinate?: No Blood in urine?: No Urinary tract infection?: No Sexually transmitted disease?: No Injury to kidneys or bladder?: No Painful intercourse?: No Weak stream?: Yes Erection problems?: Yes Penile pain?: No  Gastrointestinal Nausea?: No Vomiting?: No Indigestion/heartburn?: No Diarrhea?: No Constipation?: No  Constitutional Fever: No Night sweats?: No Weight loss?: No Fatigue?: No  Skin Skin rash/lesions?: No Itching?: No  Eyes Blurred vision?: No Double vision?: No  Ears/Nose/Throat Sore throat?: No Sinus problems?: No  Hematologic/Lymphatic Swollen glands?: No Easy bruising?: No  Cardiovascular Leg swelling?: No Chest pain?: No  Respiratory Cough?: No Shortness of breath?: No  Endocrine Excessive thirst?: No  Musculoskeletal Back pain?: No Joint pain?:  No  Neurological Headaches?: No Dizziness?: No  Psychologic Depression?: No Anxiety?: No  Physical Exam: BP 113/73   Pulse (!) 101   Wt 218 lb (98.9 kg)   BMI 35.19 kg/m   Physical Exam  Constitutional: He is oriented to person, place, and time. He appears well-developed and well-nourished.  Accompanied by wife today. Extremely pleasant couple.   HENT:  Head: Normocephalic and atraumatic.  Pulmonary/Chest: Effort normal. No respiratory distress.  Neurological: He is alert and oriented to person, place, and time.  Skin: Skin is warm and dry. No rash noted.  Psychiatric: He has a normal mood and affect.    Laboratory Data: Lab Results  Component Value Date   WBC 7.3 06/29/2017   HGB 16.4 06/29/2017   HCT 48.7 06/29/2017   MCV 99.5 06/29/2017   PLT 235.0 06/29/2017    Lab Results  Component Value Date   CREATININE 1.01 02/08/2018    Lab Results  Component Value Date   HGBA1C 5.8 06/29/2017    Pertinent Imaging: Bladder Scan  (09/28/2016 5:20PM EDT) - [facility]  Volume: 57mL  Performed bby: Iver Nestle, CNA  Void Status: Post void residual  Results for orders placed or performed in visit on 04/04/18  Bladder Scan (Post Void Residual) in office  Result Value Ref Range   Scan Result 61ml    Assessment & Plan:    1. BPH with LUTS  - Discussed with patient recommendation to stop following PSA due to age and explained that due to previous   - Patient is doing great. He is emptying his bladder and denies difficulty doing so.  - He is currently taking tamsulosin (Flomax) and mistakenly stopped taking finasteride due to belief in complications with blood pressure. We discussed that he may have stopped taking the incorrect medication, recommended that he stops taking tamsulosin (Flomax) due to blood pressure complications. Also discussed the possibility that he could also come off of the finasteride.  - RTC in 1 year for follow  up or  prn  2. Erectile Dysfunction  - Discussed options for treatment including ICIs as well as complications associated with high BP. Patient denies interest in injections.  - Discussed the option of over the counter VED and the challenges of practical usage.  - PDE5i inhibitors contraindicated due to  Contraindications with nitroglycerin   Return in about 1 year (around 04/05/2019), or if symptoms worsen or fail to improve, for IPSS and PVR.  [name], MD  [facility] [address] [place], [place] [address] [phone]  I, Stephania Fragmin , am acting as a scribe for [name], MD

## 2018-04-19 ENCOUNTER — Ambulatory Visit (INDEPENDENT_AMBULATORY_CARE_PROVIDER_SITE_OTHER): Payer: PPO

## 2018-04-19 VITALS — BP 118/70 | HR 57

## 2018-04-19 DIAGNOSIS — I959 Hypotension, unspecified: Secondary | ICD-10-CM

## 2018-04-19 NOTE — Progress Notes (Signed)
Patient is here for a blood pressure check. Patient denies chest pain, palpitations, or visual disturbances. He has been having dizziness when he stands and sob. At previous visit blood pressure was 112/74 with a heart rate of 71. Today during nurse visit first check blood pressure was 116/80 in his left arm and 118/70 in his right with a heart rate of 55.  He does take any blood pressure medications as prescribed. After consulting with PCP. He was instructed to stop taking Lisinopril.

## 2018-05-06 LAB — CUP PACEART REMOTE DEVICE CHECK
Brady Statistic AP VP Percent: 0 %
Brady Statistic AP VS Percent: 0 %
Brady Statistic AS VS Percent: 99.17 %
Brady Statistic RV Percent Paced: 0.89 %
Date Time Interrogation Session: 20191029125310
HIGH POWER IMPEDANCE MEASURED VALUE: 67 Ohm
Implantable Lead Implant Date: 20130502
Implantable Lead Location: 753860
Implantable Lead Model: 6935
Implantable Pulse Generator Implant Date: 20181031
Lead Channel Impedance Value: 399 Ohm
Lead Channel Pacing Threshold Amplitude: 0.625 V
Lead Channel Pacing Threshold Pulse Width: 0.4 ms
Lead Channel Setting Pacing Amplitude: 2.5 V
Lead Channel Setting Pacing Pulse Width: 0.4 ms
MDC IDC LEAD IMPLANT DT: 20130502
MDC IDC LEAD LOCATION: 753859
MDC IDC MSMT BATTERY REMAINING LONGEVITY: 126 mo
MDC IDC MSMT BATTERY VOLTAGE: 3.03 V
MDC IDC MSMT LEADCHNL RA IMPEDANCE VALUE: 494 Ohm
MDC IDC MSMT LEADCHNL RV IMPEDANCE VALUE: 570 Ohm
MDC IDC MSMT LEADCHNL RV SENSING INTR AMPL: 7.5 mV
MDC IDC MSMT LEADCHNL RV SENSING INTR AMPL: 7.5 mV
MDC IDC SET LEADCHNL RV SENSING SENSITIVITY: 0.3 mV
MDC IDC STAT BRADY AS VP PERCENT: 0.83 %
MDC IDC STAT BRADY RA PERCENT PACED: 0 %

## 2018-05-14 ENCOUNTER — Ambulatory Visit: Payer: PPO

## 2018-05-15 ENCOUNTER — Ambulatory Visit (INDEPENDENT_AMBULATORY_CARE_PROVIDER_SITE_OTHER): Payer: PPO

## 2018-05-15 VITALS — BP 112/72 | HR 77 | Temp 98.0°F | Resp 16 | Ht 66.0 in | Wt 214.5 lb

## 2018-05-15 DIAGNOSIS — Z0000 Encounter for general adult medical examination without abnormal findings: Secondary | ICD-10-CM

## 2018-05-15 NOTE — Patient Instructions (Signed)
[name] , Thank you for taking time to come for your Medicare Wellness Visit. I appreciate your ongoing commitment to your health goals. Please review the following plan we discussed and let me know if I can assist you in the future.   Screening recommendations/referrals: Colonoscopy: no longer required Recommended yearly ophthalmology/optometry visit for glaucoma screening and checkup Recommended yearly dental visit for hygiene and checkup  Vaccinations: Influenza vaccine: done 02/02/18 Pneumococcal vaccine: done 05/14/15 Tdap vaccine: done 02/28/11 Shingles vaccine: Shingrix discussed. Please contact your pharmacy for coverage information.     Conditions/risks identified: Keep up the great work!   Next appointment: Please follow up in one year for your Medicare Annual Wellness visit.    Preventive Care 80 Years and Older, Male Preventive care refers to lifestyle choices and visits with your health care provider that can promote health and wellness. What does preventive care include?  A yearly physical exam. This is also called an annual well check.  Dental exams once or twice a year.  Routine eye exams. Ask your health care provider how often you should have your eyes checked.  Personal lifestyle choices, including:  Daily care of your teeth and gums.  Regular physical activity.  Eating a healthy diet.  Avoiding tobacco and drug use.  Limiting alcohol use.  Practicing safe sex.  Taking low doses of aspirin every day.  Taking vitamin and mineral supplements as recommended by your health care provider. What happens during an annual well check? The services and screenings done by your health care provider during your annual well check will depend on your age, overall health, lifestyle risk factors, and family history of disease. Counseling  Your health care provider may ask you questions about your:  Alcohol use.  Tobacco use.  Drug use.  Emotional  well-being.  Home and relationship well-being.  Sexual activity.  Eating habits.  History of falls.  Memory and ability to understand (cognition).  Work and work Statistician. Screening  You may have the following tests or measurements:  Height, weight, and BMI.  Blood pressure.  Lipid and cholesterol levels. These may be checked every 5 years, or more frequently if you are over 80 years old.  Skin check.  Lung cancer screening. You may have this screening every year starting at age 80 if you have a 30-pack-year history of smoking and currently smoke or have quit within the past 15 years.  Fecal occult blood test (FOBT) of the stool. You may have this test every year starting at age 80.  Flexible sigmoidoscopy or colonoscopy. You may have a sigmoidoscopy every 5 years or a colonoscopy every 10 years starting at age 80.  Prostate cancer screening. Recommendations will vary depending on your family history and other risks.  Hepatitis C blood test.  Hepatitis B blood test.  Sexually transmitted disease (STD) testing.  Diabetes screening. This is done by checking your blood sugar (glucose) after you have not eaten for a while (fasting). You may have this done every 1-3 years.  Abdominal aortic aneurysm (AAA) screening. You may need this if you are a current or former smoker.  Osteoporosis. You may be screened starting at age 80 if you are at high risk. Talk with your health care provider about your test results, treatment options, and if necessary, the need for more tests. Vaccines  Your health care provider may recommend certain vaccines, such as:  Influenza vaccine. This is recommended every year.  Tetanus, diphtheria, and acellular pertussis (Tdap, Td)  vaccine. You may need a Td booster every 10 years.  Zoster vaccine. You may need this after age 80.  Pneumococcal 13-valent conjugate (PCV13) vaccine. One dose is recommended after age 80.  Pneumococcal  polysaccharide (PPSV23) vaccine. One dose is recommended after age 80. Talk to your health care provider about which screenings and vaccines you need and how often you need them. This information is not intended to replace advice given to you by your health care provider. Make sure you discuss any questions you have with your health care provider. Document Released: 05/22/2015 Document Revised: 01/13/2016 Document Reviewed: 02/24/2015 Elsevier Interactive Patient Education  2017 [employer] Prevention in the Home Falls can cause injuries. They can happen to people of all ages. There are many things you can do to make your home safe and to help prevent falls. What can I do on the outside of my home?  Regularly fix the edges of walkways and driveways and fix any cracks.  Remove anything that might make you trip as you walk through a door, such as a raised step or threshold.  Trim any bushes or trees on the path to your home.  Use bright outdoor lighting.  Clear any walking paths of anything that might make someone trip, such as rocks or tools.  Regularly check to see if handrails are loose or broken. Make sure that both sides of any steps have handrails.  Any raised decks and porches should have guardrails on the edges.  Have any leaves, snow, or ice cleared regularly.  Use sand or salt on walking paths during winter.  Clean up any spills in your garage right away. This includes oil or grease spills. What can I do in the bathroom?  Use night lights.  Install grab bars by the toilet and in the tub and shower. Do not use towel bars as grab bars.  Use non-skid mats or decals in the tub or shower.  If you need to sit down in the shower, use a plastic, non-slip stool.  Keep the floor dry. Clean up any water that spills on the floor as soon as it happens.  Remove soap buildup in the tub or shower regularly.  Attach bath mats securely with double-sided non-slip rug  tape.  Do not have throw rugs and other things on the floor that can make you trip. What can I do in the bedroom?  Use night lights.  Make sure that you have a light by your bed that is easy to reach.  Do not use any sheets or blankets that are too big for your bed. They should not hang down onto the floor.  Have a firm chair that has side arms. You can use this for support while you get dressed.  Do not have throw rugs and other things on the floor that can make you trip. What can I do in the kitchen?  Clean up any spills right away.  Avoid walking on wet floors.  Keep items that you use a lot in easy-to-reach places.  If you need to reach something above you, use a strong step stool that has a grab bar.  Keep electrical cords out of the way.  Do not use floor polish or wax that makes floors slippery. If you must use wax, use non-skid floor wax.  Do not have throw rugs and other things on the floor that can make you trip. What can I do with my stairs?  Do not leave  any items on the stairs.  Make sure that there are handrails on both sides of the stairs and use them. Fix handrails that are broken or loose. Make sure that handrails are as long as the stairways.  Check any carpeting to make sure that it is firmly attached to the stairs. Fix any carpet that is loose or worn.  Avoid having throw rugs at the top or bottom of the stairs. If you do have throw rugs, attach them to the floor with carpet tape.  Make sure that you have a light switch at the top of the stairs and the bottom of the stairs. If you do not have them, ask someone to add them for you. What else can I do to help prevent falls?  Wear shoes that:  Do not have high heels.  Have rubber bottoms.  Are comfortable and fit you well.  Are closed at the toe. Do not wear sandals.  If you use a stepladder:  Make sure that it is fully opened. Do not climb a closed stepladder.  Make sure that both sides of the  stepladder are locked into place.  Ask someone to hold it for you, if possible.  Clearly mark and make sure that you can see:  Any grab bars or handrails.  First and last steps.  Where the edge of each step is.  Use tools that help you move around (mobility aids) if they are needed. These include:  Canes.  Walkers.  Scooters.  Crutches.  Turn on the lights when you go into a dark area. Replace any light bulbs as soon as they burn out.  Set up your furniture so you have a clear path. Avoid moving your furniture around.  If any of your floors are uneven, fix them.  If there are any pets around you, be aware of where they are.  Review your medicines with your doctor. Some medicines can make you feel dizzy. This can increase your chance of falling. Ask your doctor what other things that you can do to help prevent falls. This information is not intended to replace advice given to you by your health care provider. Make sure you discuss any questions you have with your health care provider. Document Released: 02/19/2009 Document Revised: 10/01/2015 Document Reviewed: 05/30/2014 Elsevier Interactive Patient Education  2017 [employer].

## 2018-05-15 NOTE — Progress Notes (Signed)
Subjective:   Chris Price is a 78 y.o. male who presents for Medicare Annual/Subsequent preventive examination.  Review of Systems:   Cardiac Risk Factors include: advanced age (>30men, >62 women);dyslipidemia;hypertension;male gender;obesity (BMI >30kg/m2)     Objective:    Vitals: BP 102/62 (BP Location: Left Arm, Patient Position: Sitting, Cuff Size: Normal)   Pulse 77   Temp 98 F (36.7 C) (Oral)   Resp 16   Ht 5\' 6"  (1.676 m)   Wt 214 lb 8 oz (97.3 kg)   SpO2 98%   BMI 34.62 kg/m   Body mass index is 34.62 kg/m.  Advanced Directives 05/15/2018 05/12/2017 03/08/2017 05/12/2016 07/30/2015 05/13/2015 09/08/2011  Does Patient Have a Medical Advance Directive? Yes Yes Yes Yes No Yes Patient would like information  Type of Advance Directive Living will;Healthcare Power of [place];Living will [place];Living will Living will - [place];Living will -  Does patient want to make changes to medical advance directive? No - Patient declined No - Patient declined No - Patient declined No - Patient declined - - -  Copy of [place] in Chart? Yes - validated most recent copy scanned in chart (See row information) Yes Yes - - Yes -  Would patient like information on creating a medical advance directive? - - - - No - patient declined information - Referral made to social work    Tobacco Social History   Tobacco Use  Smoking Status Never Smoker  Smokeless Tobacco Never Used     Counseling given: Not Answered   Clinical Intake:  Pre-visit preparation completed: Yes  Pain : No/denies pain     Nutritional Status: BMI > 30  Obese Nutritional Risks: None Diabetes: No  How often do you need to have someone help you when you read instructions, pamphlets, or other written materials from your doctor or pharmacy?: 1 - Never What is the last grade level you completed in school?: high  school  Interpreter Needed?: No  Information entered by :: Clemetine Marker LPN  Past Medical History:  Diagnosis Date  . Atrial fibrillation ([place])   . Cardiomyopathy -nonischemic    moderate to severe EF 25-30% March 2013  . CHF (congestive heart failure) ([place])   . Chronic systolic heart failure ([place]) 08/25/2011  . Coronary artery disease 08/15/2011   Moderate nonobstructive CAD with 60% mid LAD, 60% proximal RCA and 30 % stenossis left circumflex. Cardiac catheterization in March of 2015 showed no significant change   . Degenerative disk disease    lumbar  . Disc herniation   . GERD (gastroesophageal reflux disease)   . Hyperlipidemia   . Hypertension   . ICD (implantable cardiac defibrillator) in place   . IVCD (intraventricular conduction defect)    Nonspecific QRS duration 122  . Lumbar spinal stenosis   . Neuromuscular disorder ([place])    neauropathy   / siatica  . Persistent atrial fibrillation 11/07/2011  . Sleep apnea    uses cpap   Past Surgical History:  Procedure Laterality Date  . C[facility]   . CARDIAC CATHETERIZATION  07/09/2013   [facility]  . CARD[facility]ERSION  08/2011   [facility]  . CHOLECYSTECTOMY  April 2015  . COLONOSCOPY WITH PROPOFOL N/A 07/30/2015   Procedure: COLONOSCOPY WITH PROPOFOL;  Surgeon: Lollie Sails, MD;  Location: [facility] ENDOSCOPY;  Service: Endoscopy;  Laterality: N/A;  . cooled thermotherapy  2007  [place], complicated by incontinence  . HERNIA REPAIR    . ICD  09/08/2011  . ICD GENERATOR CHANGEOUT N/A 03/08/2017   Procedure: ICD GENERATOR CHANGEOUT;  Surgeon: [name], MD;  Location: [place] CV LAB;  Service: Cardiovascular;  Laterality: N/A;  . IMPLANTABLE CARDIOVERTER DEFIBRILLATOR IMPLANT  09/08/2011   Procedure: IMPLANTABLE CARDIOVERTER DEFIBRILLATOR IMPLANT;  Surgeon: [name], MD;  Location: [facility] CATH LAB;  Service: Cardiovascular;;  . LUMBAR LAMINECTOMY     L2 through S1 with wide decompression of the thecal  sac and nerve roots.  Marland Kitchen PROSTATE BIOPSY  2013   Family History  Problem Relation Age of Onset  . Heart attack Father   . Heart attack Brother   . Heart attack Son   . Ovarian cancer Daughter    Social History   Socioeconomic History  . Marital status: Married    Spouse name: Not on file  . Number of children: 3  . Years of education: Not on file  . Highest education level: 12th grade  Occupational History  . 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 Financial resource strain: Not hard at all  . Food insecurity:    Worry: Never true    Inability: Never true  . Transportation needs:    Medical: No    Non-medical: No  Tobacco Use  . Smoking status: Never Smoker  . Smokeless tobacco: Never Used  Substance and Sexual Activity  . Alcohol use: No  . Drug use: No  . Sexual activity: Not Currently  Lifestyle  . Physical activity:    Days per week: 0 days    Minutes per session: 0 min  . Stress: Only a little  Relationships  . Social connections:    Talks on phone: More than three times a week    Gets together: Three times a week    Attends religious service: Never    Active member of club or organization: No    Attends meetings of clubs or organizations: Never    Relationship status: Married  Other Topics Concern  . Not on file  Social History Narrative   Second marriage, together since 1980   He has 3 children from previous marriage and one step-daughter     Outpatient Encounter Medications as of 05/15/2018  Medication Sig  . finasteride (PROSCAR) 5 MG tablet Take by mouth.  . ezetimibe (ZETIA) 10 MG tablet Take 1 tablet (10 mg total) by mouth daily.  . furosemide (LASIX) 20 MG tablet Take 1 tablet (20 mg total) by mouth daily.  Marland Kitchen lisinopril (PRINIVIL,ZESTRIL) 5 MG tablet Take by mouth.  . multivitamin (ONE-A-DAY MEN'S) TABS tablet Take 1 tablet by mouth daily.  . nitroGLYCERIN (NITROSTAT) 0.4 MG SL tablet Place 1 tablet (0.4 mg total)  under the tongue every 5 (five) minutes as needed for chest pain.  Marland Kitchen omeprazole (PRILOSEC) 20 MG capsule TAKE 1 CAPSULE 2 TIMES DAILY BEFORE A MEAL. FOR ACID REFLUX. (Patient taking differently: Take 20 mg by mouth 2 (two) times daily as needed (heartburn). )  . rivaroxaban (XARELTO) 20 MG TABS tablet Take 1 tablet (20 mg total) by mouth daily with supper.  . rosuvastatin (CRESTOR) 40 MG tablet Take 1 tablet (40 mg total) by mouth daily.  . traZODone (DESYREL) 50 MG tablet Take 1.5 tablets (75 mg total) by mouth at bedtime.  . vitamin C (ASCORBIC ACID) 500 MG tablet Take 500 mg by mouth daily.  . [  DISCONTINUED] azelastine (ASTELIN) 0.1 % nasal spray Place 1 spray into both nostrils 2 (two) times daily.  . [DISCONTINUED] diphenhydramine-acetaminophen (TYLENOL PM) 25-500 MG TABS tablet Take 2 tablets by mouth at bedtime as needed (sleep).  . [DISCONTINUED] loperamide (IMODIUM A-D) 2 MG tablet Take 2 mg by mouth as needed for diarrhea or loose stools.  . [DISCONTINUED] loratadine (CLARITIN) 10 MG tablet Take 1 tablet (10 mg total) by mouth daily as needed for allergies.  . [DISCONTINUED] traMADol (ULTRAM) 50 MG tablet Take 1 tablet (50 mg total) by mouth 3 (three) times daily as needed. (Patient taking differently: Take 50 mg by mouth 3 (three) times daily as needed for moderate pain. )   No facility-administered encounter medications on file as of 05/15/2018.     Activities of Daily Living In your present state of health, do you have any difficulty performing the following activities: 05/15/2018 02/08/2018  Hearing? N Y  Comment wears hearing aids -  Vision? N Y  Comment wears glasses -  Difficulty concentrating or making decisions? N N  Walking or climbing stairs? N N  Dressing or bathing? N N  Doing errands, shopping? N N  Preparing Food and eating ? N -  Using the Toilet? N -  In the past six months, have you accidently leaked urine? N -  Do you have problems with loss of bowel control? N -   Managing your Medications? N -  Managing your Finances? N -  Housekeeping or managing your Housekeeping? N -  Some recent data might be hidden    Patient Care Team: [name], MD as PCP - General (Family Medicine) [name], MD as Consulting Physician (Cardiology) Murrell Redden, MD as Consulting Physician (Urology)   Assessment:   This is a routine wellness examination for Kendra.  Exercise Activities and Dietary recommendations Current Exercise Habits: Home exercise routine(golf once weekly), Type of exercise: Other - see comments(golf, plans to start water aerobics again), Time (Minutes): 60, Frequency (Times/Week): 1, Weekly Exercise (Minutes/Week): 60, Intensity: Mild, Exercise limited by: cardiac condition(s)  Goals    . DIET - INCREASE WATER INTAKE    . Increase physical activity     Stretch!! Chair exercises as demonstrated, increase as tolerated Silver sneaker water aerobics 3 times weekly, 45-60 minutes        Fall Risk Fall Risk  05/15/2018 02/08/2018 08/28/2017 05/12/2017 05/12/2016  Falls in the past year? 0 No No No No  Number falls in past yr: 0 - - - -   FALL RISK PREVENTION PERTAINING TO THE HOME:  Any stairs in or around the home WITH handrails? Yes  Home free of loose throw rugs in walkways, pet beds, electrical cords, etc? Yes  Adequate lighting in your home to reduce risk of falls? Yes   ASSISTIVE DEVICES UTILIZED TO PREVENT FALLS:  Life alert? No  Use of a cane, walker or w/c? No  Grab bars in the bathroom? Yes  Shower chair or bench in shower? No  Elevated toilet seat or a handicapped toilet? No   DME ORDERS:  DME order needed?  No   TIMED UP AND GO:  Was the test performed? Yes .  Length of time to ambulate 10 feet: 6 sec.   GAIT:  Appearance of gait: Gait stead-fast and without the use of an assistive device.  Education: Fall risk prevention has been discussed.  Intervention(s) required? No   Depression Screen PHQ 2/9 Scores  05/15/2018 02/08/2018 08/28/2017 05/12/2017  PHQ - 2 Score 0 0 1 0  PHQ- 9 Score - - 3 -    Cognitive Function MMSE - Mini Mental State Exam 05/12/2016 05/13/2015  Orientation to time 5 5  Orientation to Place 5 5  Registration 3 3  Attention/ Calculation 5 5  Recall 3 3  Language- name 2 objects 2 2  Language- repeat 1 1  Language- follow 3 step command 3 3  Language- read & follow direction 1 1  Write a sentence 1 1  Copy design 1 1  Total score 30 30     6CIT Screen 05/15/2018 05/12/2017  What Year? 0 points 0 points  What month? 0 points 0 points  What time? 0 points 0 points  Count back from 20 0 points 0 points  Months in reverse 2 points 0 points  Repeat phrase 2 points 0 points  Total Score 4 0    Immunization History  Administered Date(s) Administered  . Influenza Split 02/19/2012  . Influenza-Unspecified 02/08/2013, 02/09/2016, 02/20/2017, 02/02/2018  . Pneumococcal Conjugate-13 05/14/2015  . Pneumococcal Polysaccharide-23 03/21/2012  . Tdap 02/28/2011    Qualifies for Shingles Vaccine? Yes . Due for Shingrix. Education has been provided regarding the importance of this vaccine. Pt has been advised to call insurance company to determine out of pocket expense. Advised may also receive vaccine at local pharmacy or Health Dept. Verbalized acceptance and understanding.  Tdap: Up to date   Flu Vaccine: Up to date   Pneumococcal Vaccine: Up to date   Screening Tests Health Maintenance  Topic Date Due  . TETANUS/TDAP  02/27/2021  . INFLUENZA VACCINE  Completed  . PNA vac Low Risk Adult  Completed   Cancer Screenings:  Colorectal Screening: Completed 2017 No longer required.   Lung Cancer Screening: (Low Dose CT Chest recommended if Age 38-80 years, 30 pack-year currently smoking OR have quit w/in 15years.) does not qualify.    Additional Screening:  Hepatitis C Screening: no longer required.  Vision Screening: Recommended annual ophthalmology exams for early  detection of glaucoma and other disorders of the eye. Is the patient up to date with their annual eye exam?  Yes  Who is the provider or what is the name of the office in which the pt attends annual eye exams? [place] Screening: Recommended annual dental exams for proper oral hygiene  Community Resource Referral:  CRR required this visit?  No       Plan:    I have personally reviewed and addressed the Medicare Annual Wellness questionnaire and have noted the following in the patient's chart:  A. Medical and social history B. Use of alcohol, tobacco or illicit drugs  C. Current medications and supplements D. Functional ability and status E.  Nutritional status F.  Physical activity G. Advance directives H. List of other physicians I.  Hospitalizations, surgeries, and ER visits in previous 12 months J.  [place] such as hearing and vision if needed, cognitive and depression L. Referrals and appointments   In addition, I have reviewed and discussed with patient certain preventive protocols, quality metrics, and best practice recommendations. A written personalized care plan for preventive services as well as general preventive health recommendations were provided to patient.   Signed,  Clemetine Marker, LPN Nurse Health Advisor   Nurse Notes: pt states he is experiencing shaking of his hands and legs at times and more often if he feels under stress. Patient wants to know if this  could be any side effects of medication or neurological problem and would like to be referred to neurology if possible. Advised patient he would need office visit for evaluation in order to be sent for referral. Front desk scheduled pt for Friday 05/18/18.

## 2018-05-18 ENCOUNTER — Ambulatory Visit (INDEPENDENT_AMBULATORY_CARE_PROVIDER_SITE_OTHER): Payer: PPO | Admitting: Family Medicine

## 2018-05-18 ENCOUNTER — Encounter: Payer: Self-pay | Admitting: Family Medicine

## 2018-05-18 VITALS — BP 122/68 | HR 91 | Temp 98.1°F | Resp 16 | Ht 66.0 in | Wt 216.9 lb

## 2018-05-18 DIAGNOSIS — R251 Tremor, unspecified: Secondary | ICD-10-CM

## 2018-05-18 NOTE — Progress Notes (Signed)
Name: Chris Price   MRN: [id number]    DOB: [birth year]   Date:05/18/2018       Progress Note  Subjective  Chief Complaint  Chief Complaint  Patient presents with  . Tremors    Onset-years but getting worst bilateral hands.  . Shaking Hands    HPI  Tremors: going on for a couple of year, however over the past 6 months he noticed significant worsening, affecting his hand writing, tremors present with movement, it does not happen while sleeping, no gait problems, no memory or behavior changes. Dizziness resolved when spironolactone and carvedilol were stopped. He does not have a lot of caffeine. Wife is concerned because symptoms are much worse and would like referral to neurologist    Patient Active Problem List   Diagnosis Date Noted  . Hearing loss, sensorineural 09/12/2017  . Prediabetes 07/01/2017  . Hx of colonic polyps 06/19/2016  . Long-term use of high-risk medication 06/19/2016  . Tubular adenoma of colon 09/07/2015  . Low back pain due to displacement of intervertebral disc 04/09/2015  . Diarrhea following gastrointestinal surgery 09/04/2013  . S/P laparoscopic cholecystectomy 08/20/2013  . Chronic atrial fibrillation 07/15/2013  . Morbid obesity ([place]) 02/28/2013  . OSA on CPAP 01/03/2013  . Hyperlipidemia with target low density lipoprotein (LDL) cholesterol less than 70 mg/dL 03/22/2012  . ED (erectile dysfunction) of organic origin 01/23/2012  . Benign localized prostatic hyperplasia with lower urinary tract symptoms (LUTS) 01/23/2012  . Bladder outlet obstruction 12/25/2011  . Essential tremor 12/22/2011  . Hypertension   . Hearing loss in right ear 09/20/2011  . Elevated PSA, less than 10 ng/ml 09/20/2011  . Automatic implantable cardioverter-defibrillator Medtronic dual-chamber 09/09/2011  . Chronic systolic heart failure ([place]) 08/25/2011  . IVCD (intraventricular conduction defect)   . Coronary artery disease 08/15/2011    Past Surgical History:   Procedure Laterality Date  . C[facility]   . CARDIAC CATHETERIZATION  07/09/2013   [facility]  . CARDIOVERSION  08/2011   [facility]  . CHOLECYSTECTOMY  April 2015  . COLONOSCOPY WITH PROPOFOL N/A 07/30/2015   Procedure: COLONOSCOPY WITH PROPOFOL;  Surgeon: Lollie Sails, MD;  Location: [facility] ENDOSCOPY;  Service: Endoscopy;  Laterality: N/A;  . cooled thermotherapy  0076   [place], complicated by incontinence  . HERNIA REPAIR    . ICD  09/08/2011  . ICD GENERATOR CHANGEOUT N/A 03/08/2017   Procedure: ICD GENERATOR CHANGEOUT;  Surgeon: [name], MD;  Location: [place] CV LAB;  Service: Cardiovascular;  Laterality: N/A;  . IMPLANTABLE CARDIOVERTER DEFIBRILLATOR IMPLANT  09/08/2011   Procedure: IMPLANTABLE CARDIOVERTER DEFIBRILLATOR IMPLANT;  Surgeon: [name], MD;  Location: [facility] CATH LAB;  Service: Cardiovascular;;  . LUMBAR LAMINECTOMY     L2 through S1 with wide decompression of the thecal sac and nerve roots.  Marland Kitchen PROSTATE BIOPSY  2013    Family History  Problem Relation Age of Onset  . Heart attack Father   . Heart attack Brother   . Heart attack Son   . Ovarian cancer Daughter     Social History   Socioeconomic History  . Marital status: Married    Spouse name: Not on file  . Number of children: 3  . Years of education: Not on file  . Highest education level: 12th grade  Occupational History  . 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 Financial resource strain: Not hard at  all  . Food insecurity:    Worry: Never true    Inability: Never true  . Transportation needs:    Medical: No    Non-medical: No  Tobacco Use  . Smoking status: Never Smoker  . Smokeless tobacco: Never Used  Substance and Sexual Activity  . Alcohol use: No  . Drug use: No  . Sexual activity: Not Currently  Lifestyle  . Physical activity:    Days per week: 0 days    Minutes per session: 0 min  . Stress: Only a little   Relationships  . Social connections:    Talks on phone: More than three times a week    Gets together: Three times a week    Attends religious service: Never    Active member of club or organization: No    Attends meetings of clubs or organizations: Never    Relationship status: Married  . Intimate partner violence:    Fear of current or ex partner: No    Emotionally abused: No    Physically abused: No    Forced sexual activity: No  Other Topics Concern  . Not on file  Social History Narrative   Second marriage, together since 35   He has 3 children from previous marriage and one step-daughter      Current Outpatient Medications:  .  ezetimibe (ZETIA) 10 MG tablet, Take 1 tablet (10 mg total) by mouth daily., Disp: 90 tablet, Rfl: 1 .  finasteride (PROSCAR) 5 MG tablet, Take by mouth., Disp: , Rfl:  .  furosemide (LASIX) 20 MG tablet, Take 1 tablet (20 mg total) by mouth daily., Disp: 90 tablet, Rfl: 3 .  lisinopril (PRINIVIL,ZESTRIL) 5 MG tablet, Take 2.5 mg by mouth daily., Disp: , Rfl:  .  multivitamin (ONE-A-DAY MEN'S) TABS tablet, Take 1 tablet by mouth daily., Disp: , Rfl:  .  nitroGLYCERIN (NITROSTAT) 0.4 MG SL tablet, Place 1 tablet (0.4 mg total) under the tongue every 5 (five) minutes as needed for chest pain., Disp: 25 tablet, Rfl: 3 .  omeprazole (PRILOSEC) 20 MG capsule, TAKE 1 CAPSULE 2 TIMES DAILY BEFORE A MEAL. FOR ACID REFLUX. (Patient taking differently: Take 20 mg by mouth 2 (two) times daily as needed (heartburn). ), Disp: 60 capsule, Rfl: 5 .  rivaroxaban (XARELTO) 20 MG TABS tablet, Take 1 tablet (20 mg total) by mouth daily with supper., Disp: 90 tablet, Rfl: 3 .  rosuvastatin (CRESTOR) 40 MG tablet, Take 1 tablet (40 mg total) by mouth daily., Disp: 90 tablet, Rfl: 1 .  traZODone (DESYREL) 50 MG tablet, Take 1.5 tablets (75 mg total) by mouth at bedtime., Disp: 135 tablet, Rfl: 1 .  vitamin C (ASCORBIC ACID) 500 MG tablet, Take 500 mg by mouth daily., Disp:  , Rfl:   Allergies  Allergen Reactions  . Coreg [Carvedilol] Other (See Comments)    hypotension  . Flomax [Tamsulosin Hcl] Other (See Comments)    Hypotension    I personally reviewed active problem list, medication list, allergies, family history, social history with the patient/caregiver today.   ROS  Constitutional: Negative for fever or weight change.  Respiratory: Negative for cough and shortness of breath.   Cardiovascular: Negative for chest pain or palpitations.  Gastrointestinal: Negative for abdominal pain, no bowel changes.  Musculoskeletal: Negative for gait problem or joint swelling.  Skin: Negative for rash.  Neurological: Negative for dizziness or headache.  No other specific complaints in a complete review of systems (except as listed  in HPI above).   Objective  Vitals:   05/18/18 0919  BP: 122/68  Pulse: 91  Resp: 16  Temp: 98.1 F (36.7 C)  TempSrc: Oral  SpO2: 97%  Weight: 216 lb 14.4 oz (98.4 kg)  Height: 5\' 6"  (1.676 m)    Body mass index is 35.01 kg/m.  Physical Exam  Constitutional: Patient appears well-developed and well-nourished. Obese  No distress.  HEENT: head atraumatic, normocephalic, pupils equal and reactive to light,neck supple, strabismus right eye, throat within normal limits Cardiovascular: Normal rate, regular rhythm and normal heart sounds.  No murmur heard. No BLE edema. Pulmonary/Chest: Effort normal and breath sounds normal. No respiratory distress. Abdominal: Soft.  There is no tenderness. Neurological : tremors when holding hand straight, not when hand flat on his lap and not paying attention. Normal gait. Romberg negative , some bobbing of head Psychiatric: Patient has a normal mood and affect. behavior is normal. Judgment and thought content normal.  Recent Results (from the past 2160 hour(s))  CUP PACEART REMOTE DEVICE CHECK     Status: None   Collection Time: 03/06/18 12:53 PM  Result Value Ref Range   Date Time  Interrogation Session [phone]    Pulse Generator Manufacturer MERM    Pulse Gen Model [id number] Evera MRI XT DR    Pulse Gen Serial Number [id number]    Clinic Name [facility]    Implantable Pulse Generator Type Implantable Cardiac Defibulator    Implantable Pulse Generator Implant Date [id number]    Implantable Lead Manufacturer MERM    Implantable Lead Model 5076 CapSureFix Novus    Implantable Lead Serial Number [id number]    Implantable Lead Implant Date [id number]    Implantable Lead Location Detail 1 APPENDAGE    Implantable Lead Location [id number]    Implantable Lead Manufacturer [facility]    Implantable Lead Model 6935 Sprint Quattro Secure S    Implantable Lead Serial Number [id number]    Implantable Lead Implant Date [id number]    Implantable Lead Location Detail 1 APEX    Implantable Lead Location [id number]    Lead Channel Setting Sensing Sensitivity 0.3 mV   Lead Channel Setting Pacing Pulse Width 0.4 ms   Lead Channel Setting Pacing Amplitude 2.5 V   Lead Channel Impedance Value 494 ohm   Lead Channel Impedance Value 570 ohm   Lead Channel Impedance Value 399 ohm   Lead Channel Sensing Intrinsic Amplitude 7.5 mV   Lead Channel Sensing Intrinsic Amplitude 7.5 mV   Lead Channel Pacing Threshold Amplitude 0.625 V   Lead Channel Pacing Threshold Pulse Width 0.4 ms   HighPow Impedance 67 ohm   Battery Status OK    Battery Remaining Longevity 126 mo   Battery Voltage 3.03 V   Brady Statistic RA Percent Paced 0 %   Brady Statistic RV Percent Paced 0.89 %   Brady Statistic AP VP Percent 0 %   Brady Statistic AS VP Percent 0.83 %   Brady Statistic AP VS Percent 0 %   Brady Statistic AS VS Percent 99.17 %  Bladder Scan (Post Void Residual) in office     Status: None   Collection Time: 04/04/18  8:48 AM  Result Value Ref Range   Scan Result 47ml      PHQ2/9: Depression screen [facility] 2/9 05/15/2018 02/08/2018 08/28/2017 05/12/2017 05/12/2016  Decreased Interest 0 0 1 0 0  Down,  Depressed, Hopeless 0 0 0 0 0  PHQ - 2 Score 0 0 1 0 0  Altered sleeping - - 1 - -  Tired, decreased energy - - 0 - -  Change in appetite - - 1 - -  Feeling bad or failure about yourself  - - 0 - -  Trouble concentrating - - 0 - -  Moving slowly or fidgety/restless - - 0 - -  Suicidal thoughts - - 0 - -  PHQ-9 Score - - 3 - -  Difficult doing work/chores - - Somewhat difficult - -     Fall Risk: Fall Risk  05/15/2018 02/08/2018 08/28/2017 05/12/2017 05/12/2016  Falls in the past year? 0 No No No No  Number falls in past yr: 0 - - - -     Assessment & Plan  1. Tremors of nervous system  - Ambulatory referral to Neurology

## 2018-06-05 ENCOUNTER — Ambulatory Visit (INDEPENDENT_AMBULATORY_CARE_PROVIDER_SITE_OTHER): Payer: PPO

## 2018-06-05 DIAGNOSIS — I428 Other cardiomyopathies: Secondary | ICD-10-CM | POA: Diagnosis not present

## 2018-06-05 DIAGNOSIS — I5022 Chronic systolic (congestive) heart failure: Secondary | ICD-10-CM

## 2018-06-06 NOTE — Progress Notes (Signed)
Remote ICD transmission.   

## 2018-06-07 LAB — CUP PACEART REMOTE DEVICE CHECK
Brady Statistic AP VP Percent: 0 %
Brady Statistic AP VS Percent: 0 %
Brady Statistic RA Percent Paced: 0 %
Brady Statistic RV Percent Paced: 0.32 %
Date Time Interrogation Session: 20200128154104
HIGH POWER IMPEDANCE MEASURED VALUE: 69 Ohm
Implantable Lead Implant Date: 20130502
Implantable Lead Location: 753859
Implantable Lead Location: 753860
Implantable Lead Model: 5076
Implantable Lead Model: 6935
Lead Channel Impedance Value: 380 Ohm
Lead Channel Impedance Value: 570 Ohm
Lead Channel Pacing Threshold Amplitude: 0.625 V
Lead Channel Sensing Intrinsic Amplitude: 7 mV
Lead Channel Sensing Intrinsic Amplitude: 7 mV
MDC IDC LEAD IMPLANT DT: 20130502
MDC IDC MSMT BATTERY REMAINING LONGEVITY: 123 mo
MDC IDC MSMT BATTERY VOLTAGE: 3.02 V
MDC IDC MSMT LEADCHNL RA IMPEDANCE VALUE: 494 Ohm
MDC IDC MSMT LEADCHNL RV PACING THRESHOLD PULSEWIDTH: 0.4 ms
MDC IDC PG IMPLANT DT: 20181031
MDC IDC SET LEADCHNL RV PACING AMPLITUDE: 2.5 V
MDC IDC SET LEADCHNL RV PACING PULSEWIDTH: 0.4 ms
MDC IDC SET LEADCHNL RV SENSING SENSITIVITY: 0.3 mV
MDC IDC STAT BRADY AS VP PERCENT: 0 %
MDC IDC STAT BRADY AS VS PERCENT: 100 %

## 2018-07-13 ENCOUNTER — Other Ambulatory Visit: Payer: Self-pay | Admitting: Internal Medicine

## 2018-07-13 DIAGNOSIS — I428 Other cardiomyopathies: Secondary | ICD-10-CM

## 2018-07-13 NOTE — Telephone Encounter (Signed)
Pt hasn't seen Dr. Fletcher Anon since 2016 and is due with Dr. Caryl Comes for his 55 month f/u due 05/2018.

## 2018-07-13 NOTE — Telephone Encounter (Signed)
Crcl 65ml/min. Pt needs to be scheduled to see Dr. Caryl Comes. Will send message to scheduling to get appt.

## 2018-07-13 NOTE — Telephone Encounter (Signed)
Pt overdue for f/u with Dr. Fletcher Anon please contact pt for future appointment.

## 2018-07-17 ENCOUNTER — Ambulatory Visit (INDEPENDENT_AMBULATORY_CARE_PROVIDER_SITE_OTHER): Payer: PPO | Admitting: Family Medicine

## 2018-07-17 ENCOUNTER — Encounter: Payer: Self-pay | Admitting: Family Medicine

## 2018-07-17 ENCOUNTER — Other Ambulatory Visit: Payer: Self-pay | Admitting: *Deleted

## 2018-07-17 VITALS — BP 132/84 | HR 85 | Temp 98.0°F | Resp 16 | Ht 66.0 in | Wt 216.3 lb

## 2018-07-17 DIAGNOSIS — R739 Hyperglycemia, unspecified: Secondary | ICD-10-CM | POA: Diagnosis not present

## 2018-07-17 DIAGNOSIS — Z79899 Other long term (current) drug therapy: Secondary | ICD-10-CM

## 2018-07-17 DIAGNOSIS — R251 Tremor, unspecified: Secondary | ICD-10-CM

## 2018-07-17 DIAGNOSIS — I482 Chronic atrial fibrillation, unspecified: Secondary | ICD-10-CM | POA: Diagnosis not present

## 2018-07-17 DIAGNOSIS — I5022 Chronic systolic (congestive) heart failure: Secondary | ICD-10-CM | POA: Diagnosis not present

## 2018-07-17 DIAGNOSIS — G4733 Obstructive sleep apnea (adult) (pediatric): Secondary | ICD-10-CM

## 2018-07-17 DIAGNOSIS — Z9989 Dependence on other enabling machines and devices: Secondary | ICD-10-CM

## 2018-07-17 DIAGNOSIS — F5101 Primary insomnia: Secondary | ICD-10-CM

## 2018-07-17 DIAGNOSIS — I428 Other cardiomyopathies: Secondary | ICD-10-CM

## 2018-07-17 DIAGNOSIS — E785 Hyperlipidemia, unspecified: Secondary | ICD-10-CM | POA: Diagnosis not present

## 2018-07-17 MED ORDER — EZETIMIBE 10 MG PO TABS: 10.0000 mg | ORAL_TABLET | Freq: Every day | ORAL | 1 refills | Status: DC

## 2018-07-17 MED ORDER — ROSUVASTATIN CALCIUM 40 MG PO TABS: 40.0000 mg | ORAL_TABLET | Freq: Every day | ORAL | 1 refills | Status: DC

## 2018-07-17 MED ORDER — LISINOPRIL 2.5 MG PO TABS: 2.5000 mg | ORAL_TABLET | Freq: Every day | ORAL | 1 refills | Status: DC

## 2018-07-17 MED ORDER — TRAZODONE HCL 100 MG PO TABS: 100.0000 mg | ORAL_TABLET | Freq: Every day | ORAL | 1 refills | Status: DC

## 2018-07-17 NOTE — Telephone Encounter (Signed)
Called pharmacy to verify, to only refill Lasix.  Cancelled spironolactone rx d/t phone note from PCP 03/2018.

## 2018-07-17 NOTE — Telephone Encounter (Signed)
Pt has been following up with Dr. Caryl Comes has future 12 month f/u coming up. Pt hasn't seen his primary cardiologist since [name] 2016. Please advise if ok to proceed with refill.

## 2018-07-17 NOTE — Telephone Encounter (Signed)
Refill Request for Furosemide 20 mg tablet qd is currently pending as well on this encounter.

## 2018-07-17 NOTE — Progress Notes (Signed)
Name: Chris Price   MRN: [id number]    DOB: [birth year]   Date:07/17/2018       Progress Note  Subjective  Chief Complaint  Chief Complaint  Patient presents with  . Follow-up    2 month F/U  . Tremors    Going to see Dr. [name] on 23rd of March    HPI  Chronic afib: he sees Dr. Jens Som, no palpitation or SOB, he has a pacemaker , recently checked and not problems   CHF : he is doing well, states no SOB, chest pain , orthopnea or lower extremity edema, he sees cardiologist, he is off spironolactone and beta-blocker because bp was dropping last fall. Currently only on low lisinopril, Xarelto and statin therapy. He is no longer dizzy, we will change rx from 5 mg to 2.5 mg so he does not have to cut the pills. .   OSA/CPAP: he is very compliant with CPAP, wears even on vacation, denies headache in am., he cleans like he is supposed to   Pre-diabetes: last hgbA1C is at goal, continue life style modification, he goes golfing, but no longer doing water aerobics   HTN: he denies dizziness lately, negative for chest pain or palpitation BP is at goal today   Paresthesia both feet: going on since 2007 after back surgery - Dr. Rennis Harding, but getting progressively worse. Last b12 level was normal, discussed gabapentin but he does not want it at this time, since no pain, just numbness . He will discuss it with neurologist Dr. [name] on his upcoming visit for tremors of both hands   Insomnia: he is taking trazodone 75 mg and has been sleeping well , he has to cut pill in half, we will try to change to 100 mg so he does not have to cut medication   Tremors hands: going on for years, but worse since last year, has an upcoming appointment with neurologist   Patient Active Problem List   Diagnosis Date Noted  . Hearing loss, sensorineural 09/12/2017  . Prediabetes 07/01/2017  . Hx of colonic polyps 06/19/2016  . Long-term use of high-risk medication 06/19/2016  . Tubular adenoma of colon  09/07/2015  . Low back pain due to displacement of intervertebral disc 04/09/2015  . Diarrhea following gastrointestinal surgery 09/04/2013  . S/P laparoscopic cholecystectomy 08/20/2013  . Chronic atrial fibrillation 07/15/2013  . Morbid obesity ([place]) 02/28/2013  . OSA on CPAP 01/03/2013  . Hyperlipidemia with target low density lipoprotein (LDL) cholesterol less than 70 mg/dL 03/22/2012  . ED (erectile dysfunction) of organic origin 01/23/2012  . Benign localized prostatic hyperplasia with lower urinary tract symptoms (LUTS) 01/23/2012  . Bladder outlet obstruction 12/25/2011  . Essential tremor 12/22/2011  . Hypertension   . Hearing loss in right ear 09/20/2011  . Elevated PSA, less than 10 ng/ml 09/20/2011  . Automatic implantable cardioverter-defibrillator Medtronic dual-chamber 09/09/2011  . Chronic systolic heart failure ([place]) 08/25/2011  . IVCD (intraventricular conduction defect)   . Coronary artery disease 08/15/2011    Past Surgical History:  Procedure Laterality Date  . CARDIAC CATHETERIZATION  08/15/2011   [facility]   . CARDIAC CATHETERIZATION  07/09/2013   [facility]  . CARDIOVERSION  08/2011   [facility]  . CHOLECYSTECTOMY  April 2015  . COLONOSCOPY WITH PROPOFOL N/A 07/30/2015   Procedure: COLONOSCOPY WITH PROPOFOL;  Surgeon: Lollie Sails, MD;  Location[facility] ENDOSCOPY;  Service: Endoscopy;  Laterality: N/A;  . cooled thermotherapy  [id number]   [address], complicated by  incontinence  . HERNIA REPAIR    . ICD  09/08/2011  . ICD GENERATOR CHANGEOUT N/A 03/08/2017   Procedure: ICD GENERATOR CHANGEOUT;  Surgeon: [name], MD;  Location: [place] CV LAB;  Service: Cardiovascular;  Laterality: N/A;  . IMPLANTABLE CARDIOVERTER DEFIBRILLATOR IMPLANT  09/08/2011   Procedure: IMPLANTABLE CARDIOVERTER DEFIBRILLATOR IMPLANT;  Surgeon: [name], MD;  Location: [facility] CATH LAB;  Service: Cardiovascular;;  . LUMBAR LAMINECTOMY     L2 through S1 with wide decompression of the thecal sac and  nerve roots.  Marland Kitchen PROSTATE BIOPSY  2013    Family History  Problem Relation Age of Onset  . Heart attack Father   . Heart attack Brother   . Heart attack Son   . Ovarian cancer Daughter     Social History   Socioeconomic History  . Marital status: Married    Spouse name: Not on file  . Number of children: 3  . Years of education: Not on file  . Highest education level: 12th grade  Occupational History  . 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 Financial resource strain: Not hard at all  . Food insecurity:    Worry: Never true    Inability: Never true  . Transportation needs:    Medical: No    Non-medical: No  Tobacco Use  . Smoking status: Never Smoker  . Smokeless tobacco: Never Used  Substance and Sexual Activity  . Alcohol use: No  . Drug use: No  . Sexual activity: Not Currently  Lifestyle  . Physical activity:    Days per week: 0 days    Minutes per session: 0 min  . Stress: Only a little  Relationships  . Social connections:    Talks on phone: More than three times a week    Gets together: Three times a week    Attends religious service: Never    Active member of club or organization: No    Attends meetings of clubs or organizations: Never    Relationship status: Married  . Intimate partner violence:    Fear of current or ex partner: No    Emotionally abused: No    Physically abused: No    Forced sexual activity: No  Other Topics Concern  . Not on file  Social History Narrative   Second marriage, together since 63   He has 3 children from previous marriage and one step-daughter      Current Outpatient Medications:  .  ezetimibe (ZETIA) 10 MG tablet, Take 1 tablet (10 mg total) by mouth daily., Disp: 90 tablet, Rfl: 1 .  finasteride (PROSCAR) 5 MG tablet, Take by mouth., Disp: , Rfl:  .  lisinopril (PRINIVIL,ZESTRIL) 5 MG tablet, Take 2.5 mg by mouth daily., Disp: , Rfl:  .  multivitamin (ONE-A-DAY MEN'S) TABS  tablet, Take 1 tablet by mouth daily., Disp: , Rfl:  .  nitroGLYCERIN (NITROSTAT) 0.4 MG SL tablet, Place 1 tablet (0.4 mg total) under the tongue every 5 (five) minutes as needed for chest pain., Disp: 25 tablet, Rfl: 3 .  omeprazole (PRILOSEC) 20 MG capsule, TAKE 1 CAPSULE 2 TIMES DAILY BEFORE A MEAL. FOR ACID REFLUX. (Patient taking differently: Take 20 mg by mouth 2 (two) times daily as needed (heartburn). ), Disp: 60 capsule, Rfl: 5 .  rosuvastatin (CRESTOR) 40 MG tablet, Take 1 tablet (40 mg total) by mouth daily., Disp: 90 tablet, Rfl: 1 .  traZODone (  DESYREL) 50 MG tablet, Take 1.5 tablets (75 mg total) by mouth at bedtime., Disp: 135 tablet, Rfl: 1 .  vitamin C (ASCORBIC ACID) 500 MG tablet, Take 500 mg by mouth daily., Disp: , Rfl:  .  XARELTO 20 MG TABS tablet, TAKE ONE TABLET BY MOUTH DAILY WITH SUPPER, Disp: 90 tablet, Rfl: 0 .  furosemide (LASIX) 20 MG tablet, TAKE ONE TABLET BY MOUTH DAILY, Disp: 30 tablet, Rfl: 0  Allergies  Allergen Reactions  . Coreg [Carvedilol] Other (See Comments)    hypotension  . Flomax [Tamsulosin Hcl] Other (See Comments)    Hypotension    I personally reviewed active problem list, medication list, allergies, family history, social history with the patient/caregiver today.   ROS  Constitutional: Negative for fever or weight change.  Respiratory: Negative for cough and shortness of breath.   Cardiovascular: Negative for chest pain or palpitations.  Gastrointestinal: Negative for abdominal pain, no bowel changes.  Musculoskeletal: Negative for gait problem or joint swelling.  Skin: Negative for rash.  Neurological: Negative for dizziness or headache.  No other specific complaints in a complete review of systems (except as listed in HPI above).  Objective  Vitals:   07/17/18 1422  BP: 132/84  Pulse: 85  Resp: 16  Temp: 98 F (36.7 C)  TempSrc: Oral  SpO2: 96%  Weight: 216 lb 4.8 oz (98.1 kg)  Height: 5\' 6"  (1.676 m)    Body mass  index is 34.91 kg/m.  Physical Exam  Constitutional: Patient appears well-developed and well-nourished. Obese  No distress.  HEENT: head atraumatic, normocephalic, pupils equal and reactive to light, neck supple, throat within normal limits Cardiovascular: Normal rate, irregular rhythm and normal heart sounds.  No murmur heard. No BLE edema. Pulmonary/Chest: Effort normal and breath sounds normal. No respiratory distress. Neurological: tremors only when moving, not at rest, no symptoms when sleeping, no problems walking  Abdominal: Soft.  There is no tenderness. Psychiatric: Patient has a normal mood and affect. behavior is normal. Judgment and thought content normal.  Recent Results (from the past 2160 hour(s))  CUP PACEART REMOTE DEVICE CHECK     Status: None   Collection Time: 06/05/18  3:41 PM  Result Value Ref Range   Date Time Interrogation Session [id number]    Pulse Generator Manufacturer MERM    Pulse Gen Model [id number] Evera MRI XT DR    Pulse Gen Serial Number [id number]    Clinic Name [place]    Implantable Pulse Generator Type Implantable Cardiac Defibulator    Implantable Pulse Generator Implant Date [id number]    Implantable Lead Manufacturer [facility]    Implantable Lead Model 5076 CapSureFix Novus    Implantable Lead Serial Number [id number]    Implantable Lead Implant Date [id number]    Implantable Lead Location Detail 1 APPENDAGE    Implantable Lead Location [id number]    Implantable Lead Manufacturer [facility]    Implantable Lead Model 6935 Sprint Quattro Secure S    Implantable Lead Serial Number [id number]    Implantable Lead Implant Date [id number]    Implantable Lead Location Detail 1 APEX    Implantable Lead Location [id number]    Lead Channel Setting Sensing Sensitivity 0.3 mV   Lead Channel Setting Pacing Pulse Width 0.4 ms   Lead Channel Setting Pacing Amplitude 2.5 V   Lead Channel Impedance Value 494 ohm   Lead Channel Impedance Value 570 ohm   Lead  Channel Impedance Value 380 ohm   Lead Channel Sensing Intrinsic Amplitude  7 mV   Lead Channel Sensing Intrinsic Amplitude 7 mV   Lead Channel Pacing Threshold Amplitude 0.625 V   Lead Channel Pacing Threshold Pulse Width 0.4 ms   HighPow Impedance 69 ohm   Battery Status OK    Battery Remaining Longevity 123 mo   Battery Voltage 3.02 V   Brady Statistic RA Percent Paced 0 %   Brady Statistic RV Percent Paced 0.32 %   Brady Statistic AP VP Percent 0 %   Brady Statistic AS VP Percent 0 %   Brady Statistic AP VS Percent 0 %   Brady Statistic AS VS Percent 100 %   Eval Rhythm perm AF/VS      PHQ2/9: Depression screen [facility] 2/9 07/17/2018 05/15/2018 02/08/2018 08/28/2017 05/12/2017  Decreased Interest 0 0 0 1 0  Down, Depressed, Hopeless 0 0 0 0 0  PHQ - 2 Score 0 0 0 1 0  Altered sleeping 0 - - 1 -  Tired, decreased energy 0 - - 0 -  Change in appetite 0 - - 1 -  Feeling bad or failure about yourself  0 - - 0 -  Trouble concentrating 0 - - 0 -  Moving slowly or fidgety/restless 0 - - 0 -  Suicidal thoughts 0 - - 0 -  PHQ-9 Score 0 - - 3 -  Difficult doing work/chores Not difficult at all - - Somewhat difficult -     Fall Risk: Fall Risk  07/17/2018 05/15/2018 02/08/2018 08/28/2017 05/12/2017  Falls in the past year? 0 0 No No No  Number falls in past yr: 0 0 - - -  Injury with Fall? 0 - - - -     Functional Status Survey: Is the patient deaf or have difficulty hearing?: Yes(hearing aids) Does the patient have difficulty seeing, even when wearing glasses/contacts?: Yes(glasses) Does the patient have difficulty concentrating, remembering, or making decisions?: No Does the patient have difficulty walking or climbing stairs?: No Does the patient have difficulty dressing or bathing?: No Does the patient have difficulty doing errands alone such as visiting a doctor's office or shopping?: No    Assessment & Plan  1. Dyslipidemia  - ezetimibe (ZETIA) 10 MG tablet; Take 1 tablet (10 mg  total) by mouth daily.  Dispense: 90 tablet; Refill: 1 - rosuvastatin (CRESTOR) 40 MG tablet; Take 1 tablet (40 mg total) by mouth daily.  Dispense: 90 tablet; Refill: 1  2. Primary insomnia  - traZODone (DESYREL) 100 MG tablet; Take 1 tablet (100 mg total) by mouth at bedtime.  Dispense: 90 tablet; Refill: 1  3. Chronic systolic heart failure ([facility])  - lisinopril (PRINIVIL,ZESTRIL) 2.5 MG tablet; Take 1 tablet (2.5 mg total) by mouth daily.  Dispense: 90 tablet; Refill: 1  4. OSA on CPAP   5. Chronic atrial fibrillation  Under the care of Dr. Jens Som   6. Hyperlipidemia with target low density lipoprotein (LDL) cholesterol less than 70 mg/dL  - Lipid panel  7. Tremors of nervous system  - TSH  8. Hyperglycemia  - Hemoglobin A1c  9. Long-term use of high-risk medication  - COMPLETE METABOLIC PANEL WITH GFR - CBC with Differential/Platelet

## 2018-07-17 NOTE — Telephone Encounter (Signed)
Scheduled

## 2018-07-18 LAB — COMPLETE METABOLIC PANEL WITH GFR
AG Ratio: 1.5 (calc) (ref 1.0–2.5)
ALT: 26 U/L (ref 9–46)
AST: 35 U/L (ref 10–35)
Albumin: 4 g/dL (ref 3.6–5.1)
Alkaline phosphatase (APISO): 41 U/L (ref 35–144)
BUN: 18 mg/dL (ref 7–25)
CALCIUM: 9.2 mg/dL (ref 8.6–10.3)
CO2: 27 mmol/L (ref 20–32)
Chloride: 104 mmol/L (ref 98–110)
Creat: 0.96 mg/dL (ref 0.70–1.18)
GFR, EST AFRICAN AMERICAN: 87 mL/min/{1.73_m2} (ref >=60)
GFR, EST NON AFRICAN AMERICAN: 75 mL/min/{1.73_m2} (ref >=60)
Globulin: 2.7 g/dL (calc) (ref 1.9–3.7)
Glucose, Bld: 90 mg/dL (ref 65–99)
POTASSIUM: 4.5 mmol/L (ref 3.5–5.3)
Sodium: 137 mmol/L (ref 135–146)
TOTAL PROTEIN: 6.7 g/dL (ref 6.1–8.1)
Total Bilirubin: 1 mg/dL (ref 0.2–1.2)

## 2018-07-18 LAB — CBC WITH DIFFERENTIAL/PLATELET
Absolute Monocytes: 700 cells/uL (ref 200–950)
BASOS ABS: 40 {cells}/uL (ref 0–200)
Basophils Relative: 0.6 %
EOS ABS: 112 {cells}/uL (ref 15–500)
Eosinophils Relative: 1.7 %
HCT: 44.3 % (ref 38.5–50.0)
Hemoglobin: 14.8 g/dL (ref 13.2–17.1)
Lymphs Abs: 1610 cells/uL (ref 850–3900)
MCH: 31.6 pg (ref 27.0–33.0)
MCHC: 33.4 g/dL (ref 32.0–36.0)
MCV: 94.7 fL (ref 80.0–100.0)
MONOS PCT: 10.6 %
MPV: 10.2 fL (ref 7.5–12.5)
Neutro Abs: 4138 cells/uL (ref 1500–7800)
Neutrophils Relative %: 62.7 %
Platelets: 206 10*3/uL (ref 140–400)
RBC: 4.68 10*6/uL (ref 4.20–5.80)
RDW: 13 % (ref 11.0–15.0)
Total Lymphocyte: 24.4 %
WBC: 6.6 10*3/uL (ref 3.8–10.8)

## 2018-07-18 LAB — TSH: TSH: 1.14 mIU/L (ref 0.40–4.50)

## 2018-07-18 LAB — LIPID PANEL
CHOL/HDL RATIO: 3.2 (calc) (ref <5.0)
Cholesterol: 145 mg/dL (ref <200)
HDL: 46 mg/dL (ref >=40)
LDL Cholesterol (Calc): 79 mg/dL (calc)
Non-HDL Cholesterol (Calc): 99 mg/dL (calc) (ref <130)
Triglycerides: 115 mg/dL (ref <150)

## 2018-07-18 LAB — HEMOGLOBIN A1C
Hgb A1c MFr Bld: 5.9 % of total Hgb — ABNORMAL HIGH (ref <5.7)
Mean Plasma Glucose: 123 (calc)
eAG (mmol/L): 6.8 (calc)

## 2018-08-14 ENCOUNTER — Telehealth: Payer: Self-pay

## 2018-08-14 NOTE — Telephone Encounter (Signed)
LMOVM patient to call reference scheduling a telephone or video visit.

## 2018-08-15 ENCOUNTER — Telehealth: Payer: Self-pay

## 2018-08-15 NOTE — Telephone Encounter (Signed)
Virtual Visit Pre-Appointment Phone Call  Steps For Call:  1. Confirm consent - "In the setting of the current Covid19 crisis, you are scheduled for a (phone or video) visit with your provider on (date) at (time).  Just as we do with many in-office visits, in order for you to participate in this visit, we must obtain consent.  If you'd like, I can send this to your mychart (if signed up) or email for you to review.  Otherwise, I can obtain your verbal consent now.  All virtual visits are billed to your insurance company just like a normal visit would be.  By agreeing to a virtual visit, we'd like you to understand that the technology does not allow for your provider to perform an examination, and thus may limit your provider's ability to fully assess your condition.  Finally, though the technology is pretty good, we cannot assure that it will always work on either your or our end, and in the setting of a video visit, we may have to convert it to a phone-only visit.  In either situation, we cannot ensure that we have a secure connection.  Are you willing to proceed?"YES      TELEPHONE CALL NOTE  MARSHAUN LORTIE has been deemed a candidate for a follow-up tele-health visit to limit community exposure during the Covid-19 pandemic. I spoke with the patient via phone to ensure availability of phone/video source, confirm preferred email & phone number, and discuss instructions and expectations.  I reminded PIOTR CHRISTOPHER to be prepared with any vital sign and/or heart rhythm information that could potentially be obtained via home monitoring, at the time of his visit. I reminded NEPHI SAVAGE to expect a phone call at the time of his visit if his visit.  Did the patient verbally acknowledge consent to treatment? YES  Alba Destine, RMA 08/15/2018 1:11 PM      CONSENT FOR TELE-HEALTH VISIT - PLEASE REVIEW  I hereby voluntarily request, consent and authorize [facility] and its  employed or contracted physicians, physician assistants, nurse practitioners or other licensed health care professionals (the Practitioner), to provide me with telemedicine health care services (the "Services") as deemed necessary by the treating Practitioner. I acknowledge and consent to receive the Services by the Practitioner via telemedicine. I understand that the telemedicine visit will involve communicating with the Practitioner through live audiovisual communication technology and the disclosure of certain medical information by electronic transmission. I acknowledge that I have been given the opportunity to request an in-person assessment or other available alternative prior to the telemedicine visit and am voluntarily participating in the telemedicine visit.  I understand that I have the right to withhold or withdraw my consent to the use of telemedicine in the course of my care at any time, without affecting my right to future care or treatment, and that the Practitioner or I may terminate the telemedicine visit at any time. I understand that I have the right to inspect all information obtained and/or recorded in the course of the telemedicine visit and may receive copies of available information for a reasonable fee.  I understand that some of the potential risks of receiving the Services via telemedicine include:  Marland Kitchen Delay or interruption in medical evaluation due to technological equipment failure or disruption; . Information transmitted may not be sufficient (e.g. poor resolution of images) to allow for appropriate medical decision making by the Practitioner; and/or  . In rare instances, security protocols could  fail, causing a breach of personal health information.  Furthermore, I acknowledge that it is my responsibility to provide information about my medical history, conditions and care that is complete and accurate to the best of my ability. I acknowledge that Practitioner's advice,  recommendations, and/or decision may be based on factors not within their control, such as incomplete or inaccurate data provided by me or distortions of diagnostic images or specimens that may result from electronic transmissions. I understand that the practice of medicine is not an exact science and that Practitioner makes no warranties or guarantees regarding treatment outcomes. I acknowledge that I will receive a copy of this consent concurrently upon execution via email to the email address I last provided but may also request a printed copy by calling the office of [place].    I understand that my insurance will be billed for this visit.   I have read or had this consent read to me. . I understand the contents of this consent, which adequately explains the benefits and risks of the Services being provided via telemedicine.  . I have been provided ample opportunity to ask questions regarding this consent and the Services and have had my questions answered to my satisfaction. . I give my informed consent for the services to be provided through the use of telemedicine in my medical care  By participating in this telemedicine visit I agree to the above.

## 2018-08-21 ENCOUNTER — Other Ambulatory Visit: Payer: Self-pay | Admitting: Internal Medicine

## 2018-08-21 ENCOUNTER — Telehealth (INDEPENDENT_AMBULATORY_CARE_PROVIDER_SITE_OTHER): Payer: PPO | Admitting: Internal Medicine

## 2018-08-21 ENCOUNTER — Other Ambulatory Visit: Payer: Self-pay

## 2018-08-21 VITALS — Ht 66.5 in | Wt 208.0 lb

## 2018-08-21 DIAGNOSIS — I4821 Permanent atrial fibrillation: Secondary | ICD-10-CM | POA: Diagnosis not present

## 2018-08-21 DIAGNOSIS — I428 Other cardiomyopathies: Secondary | ICD-10-CM

## 2018-08-21 DIAGNOSIS — R251 Tremor, unspecified: Secondary | ICD-10-CM | POA: Diagnosis not present

## 2018-08-21 DIAGNOSIS — I5022 Chronic systolic (congestive) heart failure: Secondary | ICD-10-CM

## 2018-08-21 MED ORDER — BISOPROLOL FUMARATE 5 MG PO TABS: 2.5000 mg | ORAL_TABLET | Freq: Every day | ORAL | 1 refills | Status: DC

## 2018-08-21 NOTE — Progress Notes (Signed)
Electrophysiology TeleHealth Note   Due to national recommendations of social distancing due to COVID 19, an audio/video telehealth visit is felt to be most appropriate for this patient at this time.  See MyChart message from today for the patient's consent to telehealth for [facility].   Date:  08/21/2018   ID:  Chris Price, DOB Apr 23, 1941, MRN [id number]  Location: patient's home  Provider location: [address], [place]  Evaluation Performed: Follow-up visit  PCP:  [name], MD  Cardiologist:   tg Electrophysiologist:  SK   Chief Complaint:  AFib and ICD  NICM   History of Present Illness:    Chris Price is a 78 y.o. male who presents via audio/video conferencing for a telehealth visit today.  Since last being seen in our clinic, the patient reports Worsening DOE without edema or abd swelling    Hx of ICD implanted 2013 for primary prevention for nonischemic cardiomyopathy further complicated by sinus bradycardia   Generator replacement 10/18.   History of atrial fibrillation managed with dabigitran>>Rivaroxaban  and previously with  amiodarone. A significant intention tremor prompted down titration and subsequent discontinuation of his amiodarone No change in his tremor  Atrial fibrillation is now permanent. He is pacing 20% time VVI 60   DATE Test    3/15 Cath   EF 35 % Nonobstructive disease               Date Cr K Hgb  10/18 0.95 4.9    3/20 0.96 4.5 14.8   Seen intercurrently by [facility] for hypotension with downtitration of meds, flomax, carvedilol  LH much improved  Furosemide 20 mg daily  Salt averse     The patient denies symptoms of fevers, chills, cough, or new SOB worrisome for COVID 19.    Past Medical History:  Diagnosis Date  . Atrial fibrillation ([place])   . Cardiomyopathy -nonischemic    moderate to severe EF 25-30% March 2013  . CHF (congestive heart failure) ([place])   . Chronic systolic heart  failure ([place]) 08/25/2011  . Coronary artery disease 08/15/2011   Moderate nonobstructive CAD with 60% mid LAD, 60% proximal RCA and 30 % stenossis left circumflex. Cardiac catheterization in March of 2015 showed no significant change   . Degenerative disk disease    lumbar  . Disc herniation   . GERD (gastroesophageal reflux disease)   . Hyperlipidemia   . Hypertension   . ICD (implantable cardiac defibrillator) in place   . IVCD (intraventricular conduction defect)    Nonspecific QRS duration 122  . Lumbar spinal stenosis   . Neuromuscular disorder ([place])    neauropathy   / siatica  . Persistent atrial fibrillation 11/07/2011  . Sleep apnea    uses cpap    Past Surgical History:  Procedure Laterality Date  . C[facility]   . CARDIAC CATHETERIZATION  07/09/2013   [facility]  . CARDI[facility]ERSION  08/2011   [facility]  . CHOLECYSTECTOMY  April 2015  . COLONOSCOPY WITH PROPOFOL N/A 07/30/2015   Procedure: COLONOSCOPY WITH PROPOFOL;  Surgeon: Lollie Sails, MD;  Location: [facility] ENDOSCOPY;  Service: Endoscopy;  Laterality: N/A;  . cooled thermotherapy  4765   [place], complicated by incontinence  . HERNIA REPAIR    . ICD  09/08/2011  . ICD GENERATOR CHANGEOUT N/A 03/08/2017   Procedure: ICD GENERATOR CHANGEOUT;  Surgeon: [name], MD;  Location: [place] CV LAB;  Service: Cardiovascular;  Laterality: N/A;  . IMPLANTABLE CARDIOVERTER DEFIBRILLATOR IMPLANT  09/08/2011   Procedure: IMPLANTABLE CARDIOVERTER DEFIBRILLATOR IMPLANT;  Surgeon: [name], MD;  Location: [facility] CATH LAB;  Service: Cardiovascular;;  . LUMBAR LAMINECTOMY     L2 through S1 with wide decompression of the thecal sac and nerve roots.  Marland Kitchen PROSTATE BIOPSY  2013    Current Outpatient Medications  Medication Sig Dispense Refill  . ezetimibe (ZETIA) 10 MG tablet Take 1 tablet (10 mg total) by mouth daily. 90 tablet 1  . finasteride (PROSCAR) 5 MG tablet Take by mouth.    . furosemide (LASIX) 20 MG  tablet TAKE ONE TABLET BY MOUTH DAILY 30 tablet 0  . lisinopril (PRINIVIL,ZESTRIL) 2.5 MG tablet Take 1 tablet (2.5 mg total) by mouth daily. 90 tablet 1  . multivitamin (ONE-A-DAY MEN'S) TABS tablet Take 1 tablet by mouth daily.    . nitroGLYCERIN (NITROSTAT) 0.4 MG SL tablet Place 1 tablet (0.4 mg total) under the tongue every 5 (five) minutes as needed for chest pain. 25 tablet 3  . omeprazole (PRILOSEC) 20 MG capsule TAKE 1 CAPSULE 2 TIMES DAILY BEFORE A MEAL. FOR ACID REFLUX. (Patient taking differently: Take 20 mg by mouth 2 (two) times daily as needed (heartburn). ) 60 capsule 5  . rosuvastatin (CRESTOR) 40 MG tablet Take 1 tablet (40 mg total) by mouth daily. 90 tablet 1  . traZODone (DESYREL) 100 MG tablet Take 1 tablet (100 mg total) by mouth at bedtime. 90 tablet 1  . vitamin C (ASCORBIC ACID) 500 MG tablet Take 500 mg by mouth daily.    Alveda Reasons 20 MG TABS tablet TAKE ONE TABLET BY MOUTH DAILY WITH SUPPER 90 tablet 0   No current facility-administered medications for this visit.     Allergies:   Coreg [carvedilol] and Flomax [tamsulosin hcl]   Social History:  The patient  reports that he has never smoked. He has never used smokeless tobacco. He reports that he does not drink alcohol or use drugs.   Family History:  The patient's   family history includes Heart attack in his brother, father, and son; Ovarian cancer in his daughter.   ROS:  Please see the history of present illness.   All other systems are personally reviewed and negative.    Exam:    Vital Signs:  Ht 5' 6.5" (1.689 m)   Wt 208 lb (94.3 kg)   BMI 33.07 kg/m     Well appearing, alert and conversant, regular work of breathing,  good skin color Eyes- anicteric, neuro- grossly intact, skin- no apparent rash or lesions or cyanosis, mouth- oral mucosa is pink   Labs/Other Tests and Data Reviewed:    Recent Labs: 07/17/2018: ALT 26; BUN 18; Creat 0.96; Hemoglobin 14.8; Platelets 206; Potassium 4.5; Sodium 137;  TSH 1.14   Wt Readings from Last 3 Encounters:  08/21/18 208 lb (94.3 kg)  07/17/18 216 lb 4.8 oz (98.1 kg)  05/18/18 216 lb 14.4 oz (98.4 kg)     Other studies personally reviewed: Additional studies/ records that were reviewed today include:  As above   Last device remote is reviewed from [place] PDF dated 1/20  which reveals normal device function,  But increasingly rapid rates from Aifb with more "VTNS events"  ASSESSMENT & PLAN:   NICM  Afib-permanent  HTN  IVCD/LBBB  High Risk Medication Surveillance  ICD-MDT   Some DOE  Likely multifactorial   Device interrogation shows AF RVR; last EF  2015 35% and could be worse LH and hypotension precludes aggressive rate control uptitration 1) stop lisinopril 2) begin bisoprolol 2,5 mg daily and  3) check remote in 4 weeks 4) increase furosemide 40 mg daily x 3 days then resume 20 5 repeat telehealth 6 weeks   COVID 19 screen The patient denies symptoms of COVID 19 at this time.  The importance of social distancing was discussed today.  Follow-up:  6 week Next remote: 5 w  Current medicines are reviewed at length with the patient today.   The patient has concerns regarding his medicines.  The following changes were made today:  As above   Labs/ tests ordered today include:  * No orders of the defined types were placed in this encounter.   Future tests ( post COVID )  Echo  in 3  months  Patient Risk:  after full review of this patients clinical status, I feel that they are at moderate risk at this time.  Today, I have spent 40 minutes with the patient with telehealth technology discussing the above.  Signed, Virl Axe, MD  08/21/2018 10:12 AM     [facility] [address] [place] [address] [phone] (office) [phone] (fax)

## 2018-08-21 NOTE — Addendum Note (Signed)
Addended byAlvis Lemmings C on: 08/21/2018 11:14 AM   Modules accepted: Orders

## 2018-08-21 NOTE — Patient Instructions (Addendum)
Medication Instructions:  - Your physician has recommended you make the following change in your medication:   1) Stop lisinopril  2) Start bisoprolol 5 mg - take 1/2 tablet (2.5 mg) by mouth once daily  3) Increase lasix (furosemide) 20 mg- take 2 tablets (40 mg) once daily x 3 days, then resume 1 tablet (20 mg) once daily  If you need a refill on your cardiac medications before your next appointment, please call your pharmacy.   Lab work: - none ordered  If you have labs (blood work) drawn today and your tests are completely normal, you will receive your results only by: Marland Kitchen MyChart Message (if you have MyChart) OR . A paper copy in the mail If you have any lab test that is abnormal or we need to change your treatment, we will call you to review the results.  Testing/Procedures: - none ordered  Follow-Up: At [facility], you and your health needs are our priority.  As part of our continuing mission to provide you with exceptional heart care, we have created designated Provider Care Teams.  These Care Teams include your primary Cardiologist (physician) and Advanced Practice Providers (APPs -  Physician Assistants and Nurse Practitioners) who all work together to provide you with the care you need, when you need it. . in 6 weeks with Dr. Caryl Comes (Video Visit)   Remote monitoring is used to monitor your Pacemaker of ICD from home. This monitoring reduces the number of office visits required to check your device to one time per year. It allows [place] to keep an eye on the functioning of your device to ensure it is working properly. You are scheduled for a device check from home on 09/18/2018. You may send your transmission at any time that day. If you have a wireless device, the transmission will be sent automatically. After your physician reviews your transmission, you will receive a postcard with your next transmission date.  Any Other Special Instructions Will Be Listed Below (If  Applicable). - N/A

## 2018-09-04 ENCOUNTER — Ambulatory Visit (INDEPENDENT_AMBULATORY_CARE_PROVIDER_SITE_OTHER): Payer: PPO | Admitting: *Deleted

## 2018-09-04 ENCOUNTER — Other Ambulatory Visit: Payer: Self-pay

## 2018-09-04 DIAGNOSIS — I5022 Chronic systolic (congestive) heart failure: Secondary | ICD-10-CM

## 2018-09-04 DIAGNOSIS — I428 Other cardiomyopathies: Secondary | ICD-10-CM

## 2018-09-04 LAB — CUP PACEART REMOTE DEVICE CHECK
Battery Remaining Longevity: 120 mo
Battery Voltage: 3.02 V
Brady Statistic AP VP Percent: 0 %
Brady Statistic AP VS Percent: 0 %
Brady Statistic AS VP Percent: 0 %
Brady Statistic AS VS Percent: 100 %
Brady Statistic RA Percent Paced: 0 %
Brady Statistic RV Percent Paced: 0.64 %
Date Time Interrogation Session: 20200428052504
HighPow Impedance: 67 Ohm
Implantable Lead Implant Date: 20130502
Implantable Lead Implant Date: 20130502
Implantable Lead Location: 753859
Implantable Lead Location: 753860
Implantable Lead Model: 5076
Implantable Lead Model: 6935
Implantable Pulse Generator Implant Date: 20181031
Lead Channel Impedance Value: 380 Ohm
Lead Channel Impedance Value: 494 Ohm
Lead Channel Impedance Value: 570 Ohm
Lead Channel Pacing Threshold Amplitude: 0.625 V
Lead Channel Pacing Threshold Pulse Width: 0.4 ms
Lead Channel Sensing Intrinsic Amplitude: 7.5 mV
Lead Channel Sensing Intrinsic Amplitude: 7.5 mV
Lead Channel Setting Pacing Amplitude: 2.5 V
Lead Channel Setting Pacing Pulse Width: 0.4 ms
Lead Channel Setting Sensing Sensitivity: 0.3 mV

## 2018-09-13 NOTE — Progress Notes (Signed)
Remote ICD transmission.   

## 2018-09-16 ENCOUNTER — Other Ambulatory Visit: Payer: Self-pay | Admitting: Internal Medicine

## 2018-09-16 DIAGNOSIS — I428 Other cardiomyopathies: Secondary | ICD-10-CM

## 2018-09-18 ENCOUNTER — Other Ambulatory Visit: Payer: Self-pay

## 2018-09-18 ENCOUNTER — Ambulatory Visit (INDEPENDENT_AMBULATORY_CARE_PROVIDER_SITE_OTHER): Payer: PPO | Admitting: *Deleted

## 2018-09-18 DIAGNOSIS — I5022 Chronic systolic (congestive) heart failure: Secondary | ICD-10-CM

## 2018-09-18 DIAGNOSIS — I428 Other cardiomyopathies: Secondary | ICD-10-CM

## 2018-09-19 LAB — CUP PACEART REMOTE DEVICE CHECK
Battery Remaining Longevity: 120 mo
Battery Voltage: 3.02 V
Brady Statistic AP VP Percent: 0 %
Brady Statistic AP VS Percent: 0 %
Brady Statistic AS VP Percent: 1.82 %
Brady Statistic AS VS Percent: 98.18 %
Brady Statistic RA Percent Paced: 0 %
Brady Statistic RV Percent Paced: 2.97 %
Date Time Interrogation Session: 20200512073324
HighPow Impedance: 66 Ohm
Implantable Lead Implant Date: 20130502
Implantable Lead Implant Date: 20130502
Implantable Lead Location: 753859
Implantable Lead Location: 753860
Implantable Lead Model: 5076
Implantable Lead Model: 6935
Implantable Pulse Generator Implant Date: 20181031
Lead Channel Impedance Value: 380 Ohm
Lead Channel Impedance Value: 494 Ohm
Lead Channel Impedance Value: 551 Ohm
Lead Channel Pacing Threshold Amplitude: 0.625 V
Lead Channel Pacing Threshold Pulse Width: 0.4 ms
Lead Channel Sensing Intrinsic Amplitude: 7.125 mV
Lead Channel Sensing Intrinsic Amplitude: 7.125 mV
Lead Channel Setting Pacing Amplitude: 2.5 V
Lead Channel Setting Pacing Pulse Width: 0.4 ms
Lead Channel Setting Sensing Sensitivity: 0.3 mV

## 2018-10-02 ENCOUNTER — Other Ambulatory Visit: Payer: Self-pay

## 2018-10-02 ENCOUNTER — Telehealth (INDEPENDENT_AMBULATORY_CARE_PROVIDER_SITE_OTHER): Payer: PPO | Admitting: Internal Medicine

## 2018-10-02 VITALS — Ht 66.0 in | Wt 209.0 lb

## 2018-10-02 DIAGNOSIS — I447 Left bundle-branch block, unspecified: Secondary | ICD-10-CM | POA: Diagnosis not present

## 2018-10-02 DIAGNOSIS — I959 Hypotension, unspecified: Secondary | ICD-10-CM

## 2018-10-02 DIAGNOSIS — I428 Other cardiomyopathies: Secondary | ICD-10-CM | POA: Diagnosis not present

## 2018-10-02 DIAGNOSIS — I5022 Chronic systolic (congestive) heart failure: Secondary | ICD-10-CM

## 2018-10-02 DIAGNOSIS — I4821 Permanent atrial fibrillation: Secondary | ICD-10-CM

## 2018-10-02 DIAGNOSIS — I10 Essential (primary) hypertension: Secondary | ICD-10-CM | POA: Diagnosis not present

## 2018-10-02 DIAGNOSIS — Z95810 Presence of automatic (implantable) cardiac defibrillator: Secondary | ICD-10-CM | POA: Diagnosis not present

## 2018-10-02 MED ORDER — FUROSEMIDE 20 MG PO TABS: ORAL_TABLET | ORAL | 3 refills | Status: DC

## 2018-10-02 NOTE — Patient Instructions (Addendum)
Medication Instructions:  - Your physician has recommended you make the following change in your medication:   1) Increase lasix (furosemide) 20 mg- take 2 tablets (40 mg) once daily x 3 days, then take 2 tablets (40 mg) once every other day  If you need a refill on your cardiac medications before your next appointment, please call your pharmacy.   Lab work: - none ordered  If you have labs (blood work) drawn today and your tests are completely normal, you will receive your results only by: Marland Kitchen MyChart Message (if you have MyChart) OR . A paper copy in the mail If you have any lab test that is abnormal or we need to change your treatment, we will call you to review the results.  Testing/Procedures: - none ordered  Follow-Up: At [facility], you and your health needs are our priority.  As part of our continuing mission to provide you with exceptional heart care, we have created designated Provider Care Teams.  These Care Teams include your primary Cardiologist (physician) and Advanced Practice Providers (APPs -  Physician Assistants and Nurse Practitioners) who all work together to provide you with the care you need, when you need it.  . You will need a follow up appointment in 3 months (August) with Dr. Caryl Comes. Marland Kitchen  Please call our office in Mid-Late June to schedule this appointment.    Remote monitoring is used to monitor your Pacemaker of ICD from home. This monitoring reduces the number of office visits required to check your device to one time per year. It allows [place] to keep an eye on the functioning of your device to ensure it is working properly. You are scheduled for a device check from home on 12/04/2018. You may send your transmission at any time that day. If you have a wireless device, the transmission will be sent automatically. After your physician reviews your transmission, you will receive a postcard with your next transmission date.  Any Other Special Instructions Will Be Listed  Below (If Applicable). - Let us know in 1 week (through [place]) how you are doing on the increased lasix (furosemide) & what your blood pressure readings have been

## 2018-10-02 NOTE — Progress Notes (Signed)
Electrophysiology TeleHealth Note   Due to national recommendations of social distancing due to COVID 19, an audio/video telehealth visit is felt to be most appropriate for this patient at this time.  See MyChart message from today for the patient's consent to telehealth for [facility].   Date:  10/02/2018   ID:  Mazi, Brailsford 1940/10/01, MRN [id number]  Location: patient's home  Provider location: [address], [place]  Evaluation Performed: Follow-up visit  PCP:  [name], MD  Cardiologist:   TG  Electrophysiologist:  SK   Chief Complaint:  Dyspnea with NICM and permanent atrial fibrillation   History of Present Illness:    Chris Price is a 78 y.o. male who presents via audio/video conferencing for a telehealth visit today.  Since last being seen in our clinic, the patient reports  Not really doing any better.  [place]  No chest pain.  He is playing golf without pain or dyspnea; last week he shot a 66 (his age) no peripheral edema.  Nocturnal dyspnea but is not abated by sitting up  He has permanent atrial fibrillation and has been managed with amiodarone for rate control.  Developing a tremor it was discontinued.  Rates have been more rapid.  At his last visit dyspnea was problematic; rate control was an issue with many more episodes of "VT-nonsustained ".  Lisinopril was discontinued because of relatively low blood pressure and bisoprolol was initiated.  The patient denies symptoms of fevers, chills, cough, or new SOB worrisome for COVID 19.   Past Medical History:  Diagnosis Date  . Atrial fibrillation ([place])   . Cardiomyopathy -nonischemic    moderate to severe EF 25-30% March 2013  . CHF (congestive heart failure) ([place])   . Chronic systolic heart failure ([place]) 08/25/2011  . Coronary artery disease 08/15/2011   Moderate nonobstructive CAD with 60% mid LAD, 60% proximal RCA  and 30 % stenossis left circumflex. Cardiac catheterization in March of 2015 showed no significant change   . Degenerative disk disease    lumbar  . Disc herniation   . GERD (gastroesophageal reflux disease)   . Hyperlipidemia   . Hypertension   . ICD (implantable cardiac defibrillator) in place   . IVCD (intraventricular conduction defect)    Nonspecific QRS duration 122  . Lumbar spinal stenosis   . Neuromuscular disorder ([place])    neauropathy   / siatica  . Persistent atrial fibrillation 11/07/2011  . Sleep apnea    uses cpap    Past Surgical History:  Procedure Laterality Date  . CARDI[facility]  [facility]   . CARDI[facility] CAT[facility]IZATION  07/09/2013   [facility]  . CARD[facility]OVERSION  08/2011   [facility]  . CHOLECYSTECTOMY  April 2015  . COLONOSCOPY WITH PROPOFOL N/A 07/30/2015   Procedure: COLONOSCOPY WITH PROPOFOL;  Surgeon: Lollie Sails, MD;  Location: [facility] ENDOSCOPY;  Service: Endoscopy;  Laterality: N/A;  . cooled thermotherapy  6712   Wolfe, complicated by incontinence  . HERNIA REPAIR    . ICD  09/08/2011  . ICD GENERATOR CHANGEOUT N/A 03/08/2017   Procedure: ICD GENERATOR CHANGEOUT;  Surgeon: [name], MD;  Location: [place] CV LAB;  Service: Cardiovascular;  Laterality: N/A;  . IMPLANTABLE CARDIOVERTER DEFIBRILLATOR IMPLANT  09/08/2011   Procedure: IMPLANTABLE CARDIOVERTER DEFIBRILLATOR IMPLANT;  Surgeon: [name], MD;  Location: [facility] CATH LAB;  Service:  Cardiovascular;;  . LUMBAR LAMINECTOMY     L2 through S1 with wide decompression of the thecal sac and nerve roots.  Marland Kitchen PROSTATE BIOPSY  2013    Current Outpatient Medications  Medication Sig Dispense Refill  . bisoprolol (ZEBETA) 5 MG tablet Take 0.5 tablets (2.5 mg total) by mouth daily. 45 tablet 1  . ezetimibe (ZETIA) 10 MG tablet Take 1 tablet (10 mg total) by mouth daily. 90 tablet 1  . finasteride (PROSCAR) 5 MG tablet Take by mouth.    . furosemide (LASIX) 20 MG tablet TAKE ONE TABLET BY MOUTH  DAILY 90 tablet 3  . multivitamin (ONE-A-DAY MEN'S) TABS tablet Take 1 tablet by mouth daily.    . nitroGLYCERIN (NITROSTAT) 0.4 MG SL tablet Place 1 tablet (0.4 mg total) under the tongue every 5 (five) minutes as needed for chest pain. 25 tablet 3  . omeprazole (PRILOSEC) 20 MG capsule TAKE 1 CAPSULE 2 TIMES DAILY BEFORE A MEAL. FOR ACID REFLUX. (Patient taking differently: Take 20 mg by mouth 2 (two) times daily as needed (heartburn). ) 60 capsule 5  . rosuvastatin (CRESTOR) 40 MG tablet Take 1 tablet (40 mg total) by mouth daily. 90 tablet 1  . traZODone (DESYREL) 100 MG tablet Take 1 tablet (100 mg total) by mouth at bedtime. 90 tablet 1  . vitamin C (ASCORBIC ACID) 500 MG tablet Take 500 mg by mouth daily.    Alveda Reasons 20 MG TABS tablet TAKE ONE TABLET BY MOUTH DAILY WITH SUPPER 90 tablet 0   No current facility-administered medications for this visit.     Allergies:   Coreg [carvedilol] and Flomax [tamsulosin hcl]   Social History:  The patient  reports that he has never smoked. He has never used smokeless tobacco. He reports that he does not drink alcohol or use drugs.   Family History:  The patient's   family history includes Heart attack in his brother, father, and son; Ovarian cancer in his daughter.   ROS:  Please see the history of present illness.   All other systems are personally reviewed and negative.    Exam:    Vital Signs:  Ht 5\' 6"  (1.676 m)   Wt 209 lb (94.8 kg)   BMI 33.73 kg/m     Well appearing, alert and conversant, regular work of breathing,  good skin color Eyes- anicteric, neuro- grossly intact, skin- no apparent rash or lesions or cyanosis, mouth- oral mucosa is pink   Labs/Other Tests and Data Reviewed:    Recent Labs: 07/17/2018: ALT 26; BUN 18; Creat 0.96; Hemoglobin 14.8; Platelets 206; Potassium 4.5; Sodium 137; TSH 1.14   Personally reviewed     Wt Readings from Last 3 Encounters:  10/02/18 209 lb (94.8 kg)  08/21/18 208 lb (94.3 kg)   07/17/18 216 lb 4.8 oz (98.1 kg)     Other studies personally reviewed: Additional studies/ records that were reviewed today include:As above   Last device remote is reviewed from [place] PDF dated 5/20  which reveals normal device function,   arrhythmias - As Below        ASSESSMENT & PLAN:    NICM  Afib-permanent  HTN  Hypotension  IVCD/LBBB  High Risk Medication Surveillance  ICD-MDT   Dyspnea is not significantly improved, this despite significant improvement in heart rate excursion as noted above with decreased fast episodes as well as histograms showing a left shift.  OptiVol is mildly increased; we will undertake a 3-day trial of increased  diuretics and look for clinical improvement.  We will then change his diuretics to every other day  Hypotension has been not an issue.  I have encouraged him to get a blood pressure cuff.  We will do so; for right now we will continue him on his bisoprolol but if blood pressure allows we will resume low-dose ACE.  They will send [place] an information by my chart next week as to symptoms following the diuretics and blood pressure   COVID 19 screen The patient denies symptoms of COVID 19 at this time.  The importance of social distancing was discussed today.  Follow-up:  81m Next remote: As Scheduled   Current medicines are reviewed at length with the patient today.   The patient does not have concerns regarding his medicines.  The following changes were made today:   Increase furosemide to 40 mg daily x3 days and then 40 mg every other day  Labs/ tests ordered today include:  No orders of the defined types were placed in this encounter.    Patient Risk:  after full review of this patients clinical status, I feel that they are at moderate risk at this time.  Today, I have spent 20 * minutes with the patient with telehealth technology discussing the above.  Signed, Virl Axe, MD  10/02/2018 10:53 AM     [facility]  HeartCare [address] [place] [address] [phone] (office) [phone] (fax)

## 2018-10-04 NOTE — Addendum Note (Signed)
Addended by: Douglass Rivers D on: 10/04/2018 11:08 AM   Modules accepted: Level of Service

## 2018-10-04 NOTE — Progress Notes (Signed)
Remote ICD transmission.   

## 2018-10-07 ENCOUNTER — Other Ambulatory Visit: Payer: Self-pay | Admitting: Internal Medicine

## 2018-10-07 DIAGNOSIS — I428 Other cardiomyopathies: Secondary | ICD-10-CM

## 2018-10-08 ENCOUNTER — Telehealth: Payer: Self-pay | Admitting: Internal Medicine

## 2018-10-08 NOTE — Telephone Encounter (Signed)
Patient spouse calling Needing a medication clarification Please call to discuss

## 2018-10-08 NOTE — Telephone Encounter (Signed)
Spoke with the pt wife and confirmed the furosemide change made during the pt 10/02/18 appt with Dr. Caryl Comes  Per Dr. Olin Pia documentation Increase lasix (furosemide) 20 mg- take 2 tablets (40 mg) once daily x 3 days, then take 2 tablets (40 mg) once every other day  Pt wife verbalized understanding and voiced appreciation for the call.

## 2018-12-03 DIAGNOSIS — E559 Vitamin D deficiency, unspecified: Secondary | ICD-10-CM | POA: Diagnosis not present

## 2018-12-03 DIAGNOSIS — I482 Chronic atrial fibrillation, unspecified: Secondary | ICD-10-CM | POA: Diagnosis not present

## 2018-12-03 DIAGNOSIS — R251 Tremor, unspecified: Secondary | ICD-10-CM | POA: Diagnosis not present

## 2018-12-03 DIAGNOSIS — R200 Anesthesia of skin: Secondary | ICD-10-CM | POA: Diagnosis not present

## 2018-12-03 DIAGNOSIS — E538 Deficiency of other specified B group vitamins: Secondary | ICD-10-CM | POA: Diagnosis not present

## 2018-12-04 ENCOUNTER — Ambulatory Visit (INDEPENDENT_AMBULATORY_CARE_PROVIDER_SITE_OTHER): Payer: PPO | Admitting: *Deleted

## 2018-12-04 DIAGNOSIS — I5022 Chronic systolic (congestive) heart failure: Secondary | ICD-10-CM

## 2018-12-04 DIAGNOSIS — I428 Other cardiomyopathies: Secondary | ICD-10-CM

## 2018-12-04 LAB — CUP PACEART REMOTE DEVICE CHECK
Battery Remaining Longevity: 117 mo
Battery Voltage: 3.01 V
Brady Statistic AP VP Percent: 0 %
Brady Statistic AP VS Percent: 0 %
Brady Statistic AS VP Percent: 2.94 %
Brady Statistic AS VS Percent: 97.06 %
Brady Statistic RA Percent Paced: 0 %
Brady Statistic RV Percent Paced: 4.35 %
Date Time Interrogation Session: 20200728052503
HighPow Impedance: 67 Ohm
Implantable Lead Implant Date: 20130502
Implantable Lead Implant Date: 20130502
Implantable Lead Location: 753859
Implantable Lead Location: 753860
Implantable Lead Model: 5076
Implantable Lead Model: 6935
Implantable Pulse Generator Implant Date: 20181031
Lead Channel Impedance Value: 437 Ohm
Lead Channel Impedance Value: 494 Ohm
Lead Channel Impedance Value: 608 Ohm
Lead Channel Pacing Threshold Amplitude: 0.75 V
Lead Channel Pacing Threshold Pulse Width: 0.4 ms
Lead Channel Sensing Intrinsic Amplitude: 7.375 mV
Lead Channel Sensing Intrinsic Amplitude: 7.375 mV
Lead Channel Setting Pacing Amplitude: 2.5 V
Lead Channel Setting Pacing Pulse Width: 0.4 ms
Lead Channel Setting Sensing Sensitivity: 0.3 mV

## 2018-12-17 ENCOUNTER — Encounter: Payer: Self-pay | Admitting: Cardiology

## 2018-12-17 NOTE — Progress Notes (Signed)
Remote ICD transmission.   

## 2019-01-02 ENCOUNTER — Other Ambulatory Visit: Payer: Self-pay | Admitting: Internal Medicine

## 2019-01-02 DIAGNOSIS — I428 Other cardiomyopathies: Secondary | ICD-10-CM

## 2019-01-02 NOTE — Telephone Encounter (Signed)
Prescription refill request for Xarelto received.   Last office visit: Chris Price (10-02-2018) Weight: 98.1 kg (07-17-2018) Age: 78 y.o. Scr: 0.96 (07-17-2018) CrCl: 87 ml/min  Prescription refill sent.

## 2019-01-07 ENCOUNTER — Other Ambulatory Visit: Payer: Self-pay

## 2019-01-07 DIAGNOSIS — F5101 Primary insomnia: Secondary | ICD-10-CM

## 2019-01-07 MED ORDER — TRAZODONE HCL 100 MG PO TABS: 100.0000 mg | ORAL_TABLET | Freq: Every day | ORAL | 0 refills | Status: DC

## 2019-01-17 ENCOUNTER — Ambulatory Visit: Payer: PPO | Admitting: Family Medicine

## 2019-01-25 ENCOUNTER — Other Ambulatory Visit: Payer: Self-pay | Admitting: Family Medicine

## 2019-01-25 ENCOUNTER — Encounter: Payer: Self-pay | Admitting: Family Medicine

## 2019-01-25 ENCOUNTER — Ambulatory Visit (INDEPENDENT_AMBULATORY_CARE_PROVIDER_SITE_OTHER): Payer: PPO | Admitting: Family Medicine

## 2019-01-25 ENCOUNTER — Other Ambulatory Visit: Payer: Self-pay

## 2019-01-25 VITALS — BP 120/78 | HR 62 | Temp 98.2°F | Resp 16 | Ht 66.0 in | Wt 204.6 lb

## 2019-01-25 DIAGNOSIS — M7062 Trochanteric bursitis, left hip: Secondary | ICD-10-CM

## 2019-01-25 DIAGNOSIS — E785 Hyperlipidemia, unspecified: Secondary | ICD-10-CM

## 2019-01-25 DIAGNOSIS — G4733 Obstructive sleep apnea (adult) (pediatric): Secondary | ICD-10-CM | POA: Diagnosis not present

## 2019-01-25 DIAGNOSIS — Z9989 Dependence on other enabling machines and devices: Secondary | ICD-10-CM | POA: Diagnosis not present

## 2019-01-25 DIAGNOSIS — F5101 Primary insomnia: Secondary | ICD-10-CM | POA: Diagnosis not present

## 2019-01-25 DIAGNOSIS — G250 Essential tremor: Secondary | ICD-10-CM | POA: Diagnosis not present

## 2019-01-25 DIAGNOSIS — Z23 Encounter for immunization: Secondary | ICD-10-CM

## 2019-01-25 DIAGNOSIS — I5022 Chronic systolic (congestive) heart failure: Secondary | ICD-10-CM

## 2019-01-25 DIAGNOSIS — R739 Hyperglycemia, unspecified: Secondary | ICD-10-CM

## 2019-01-25 MED ORDER — LISINOPRIL 2.5 MG PO TABS: 2.5000 mg | ORAL_TABLET | Freq: Every day | ORAL | 0 refills | Status: DC

## 2019-01-25 MED ORDER — EZETIMIBE 10 MG PO TABS: 10.0000 mg | ORAL_TABLET | Freq: Every day | ORAL | 1 refills | Status: DC

## 2019-01-25 MED ORDER — TRAZODONE HCL 100 MG PO TABS: 100.0000 mg | ORAL_TABLET | Freq: Every day | ORAL | 1 refills | Status: DC

## 2019-01-25 MED ORDER — ROSUVASTATIN CALCIUM 40 MG PO TABS: 40.0000 mg | ORAL_TABLET | Freq: Every day | ORAL | 1 refills | Status: DC

## 2019-01-25 NOTE — Progress Notes (Signed)
Name: Chris Price   MRN: [id number]    DOB: 1940/10/22   Date:01/25/2019       Progress Note  Subjective  Chief Complaint  Chief Complaint  Patient presents with  . Hypertension  . Coronary Artery Disease  . Sleep Apnea  . Erectile Dysfunction  . Hyperlipidemia  . Obesity  . Prediabetes    HPI  Chronic afib: he sees Dr. Jens Som, no palpitation or SOB, he has a pacemaker, he is on beta-blocker and per Dr. Leda Min last visit was advised to resume lisinopril if bp was at goal   CHF : he is doing well, he denies SOB  chest pain , orthopnea or lower extremity edema, he sees cardiologist, he is off spironolactone back on beta-blocker, he is off lisinopril but since BP is at goal now we will try resuming it since recommended by cardiologist.   OSA/CPAP: he is very compliant with CPAP, wears even on vacation, denies headache in am.. Unchanged   Pre-diabetes: last hgbA1C was slightly up, he avoids sweets, denies polyphagia, polydipsia or polyuria   HTN:bp at home has been well controlled no longer having dizziness, denies chest pain or palpitation   Paresthesia both feet: going on since 2007 after back surgery - Dr. Rennis Harding, but getting progressively worse.Last b12 level was normal, discussed gabapentin but he does not want it at this time, since no pain, just numbness. He forgot to talk to neurologist about the feet, he states he cannot feel his feet, reminded him to have closed toe shoes all the time  Insomnia:he is currently taking Trazodone 100 mg and is working well for him, he fell off the bed recently, sleeps on the edge because uses CPAP machine, advised to use a rail.   Tremors hands: going on for years, but worse since last year, seen at [facility] and was supposed to go back to [facility] but never got a call back   Patient Active Problem List   Diagnosis Date Noted  . Hearing loss, sensorineural 09/12/2017  . Prediabetes 07/01/2017  . Hx of colonic polyps  06/19/2016  . Long-term use of high-risk medication 06/19/2016  . Tubular adenoma of colon 09/07/2015  . Low back pain due to displacement of intervertebral disc 04/09/2015  . Diarrhea following gastrointestinal surgery 09/04/2013  . S/P laparoscopic cholecystectomy 08/20/2013  . Chronic atrial fibrillation 07/15/2013  . Morbid obesity ([place]) 02/28/2013  . OSA on CPAP 01/03/2013  . Hyperlipidemia with target low density lipoprotein (LDL) cholesterol less than 70 mg/dL 03/22/2012  . ED (erectile dysfunction) of organic origin 01/23/2012  . Benign localized prostatic hyperplasia with lower urinary tract symptoms (LUTS) 01/23/2012  . Bladder outlet obstruction 12/25/2011  . Essential tremor 12/22/2011  . Hypertension   . Hearing loss in right ear 09/20/2011  . Elevated PSA, less than 10 ng/ml 09/20/2011  . Automatic implantable cardioverter-defibrillator Medtronic dual-chamber 09/09/2011  . Chronic systolic heart failure ([place]) 08/25/2011  . IVCD (intraventricular conduction defect)   . Coronary artery disease 08/15/2011    Past Surgical History:  Procedure Laterality Date  . CARDIAC CAT[facility]TERIZATION  [facility]15/2011   [facility]   . CARDIAC CATHETERIZATION  07/09/2013   [facility]  . CARDIOVERSION  08/2011   [facility]  . CHOLECYSTECTOMY  April 2015  . COLONOSCOPY WITH PROPOFOL N/A 07/30/2015   Procedure: COLONOSCOPY WITH PROPOFOL;  Surgeon: Lollie Sails, MD;  Location: [facility] ENDOSCOPY;  Service: Endoscopy;  Laterality: N/A;  . cooled thermotherapy  [id number]   [place], complicated by  incontinence  . HERNIA REPAIR    . ICD  09/08/2011  . ICD GENERATOR CHANGEOUT N/A 03/08/2017   Procedure: ICD GENERATOR CHANGEOUT;  Surgeon: [name], MD;  Location: [place] CV LAB;  Service: Cardiovascular;  Laterality: N/A;  . IMPLANTABLE CARDIOVERTER DEFIBRILLATOR IMPLANT  09/08/2011   Procedure: IMPLANTABLE CARDIOVERTER DEFIBRILLATOR IMPLANT;  Surgeon: [name], MD;  Location: [facility] CATH LAB;  Service:  Cardiovascular;;  . LUMBAR LAMINECTOMY     L2 through S1 with wide decompression of the thecal sac and nerve roots.  Marland Kitchen PROSTATE BIOPSY  2013    Family History  Problem Relation Age of Onset  . Heart attack Father   . Heart attack Brother   . Heart attack Son   . Ovarian cancer Daughter     Social History   Socioeconomic History  . Marital status: Married    Spouse name: Not on file  . Number of children: 3  . Years of education: Not on file  . Highest education level: 12th grade  Occupational History  . 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 Financial resource strain: Not hard at all  . Food insecurity    Worry: Never true    Inability: Never true  . Transportation needs    Medical: No    Non-medical: No  Tobacco Use  . Smoking status: Never Smoker  . Smokeless tobacco: Never Used  Substance and Sexual Activity  . Alcohol use: No  . Drug use: No  . Sexual activity: Not Currently  Lifestyle  . Physical activity    Days per week: 0 days    Minutes per session: 0 min  . Stress: Only a little  Relationships  . Social connections    Talks on phone: More than three times a week    Gets together: Three times a week    Attends religious service: Never    Active member of club or organization: No    Attends meetings of clubs or organizations: Never    Relationship status: Married  . Intimate partner violence    Fear of current or ex partner: No    Emotionally abused: No    Physically abused: No    Forced sexual activity: No  Other Topics Concern  . Not on file  Social History Narrative   Second marriage, together since 22   He has 3 children from previous marriage and one step-daughter      Current Outpatient Medications:  .  bisoprolol (ZEBETA) 5 MG tablet, Take 0.5 tablets (2.5 mg total) by mouth daily., Disp: 45 tablet, Rfl: 1 .  ezetimibe (ZETIA) 10 MG tablet, TAKE ONE TABLET BY MOUTH DAILY, Disp: 90 tablet, Rfl: 0 .   finasteride (PROSCAR) 5 MG tablet, Take by mouth., Disp: , Rfl:  .  furosemide (LASIX) 20 MG tablet, Take 2 tablets (40 mg) by mouth once every other day as directed, Disp: 90 tablet, Rfl: 3 .  multivitamin (ONE-A-DAY MEN'S) TABS tablet, Take 1 tablet by mouth daily., Disp: , Rfl:  .  nitroGLYCERIN (NITROSTAT) 0.4 MG SL tablet, Place 1 tablet (0.4 mg total) under the tongue every 5 (five) minutes as needed for chest pain., Disp: 25 tablet, Rfl: 3 .  omeprazole (PRILOSEC) 20 MG capsule, TAKE 1 CAPSULE 2 TIMES DAILY BEFORE A MEAL. FOR ACID REFLUX. (Patient taking differently: Take 20 mg by mouth 2 (two) times daily as needed (heartburn). ), Disp: 60 capsule,  Rfl: 5 .  rosuvastatin (CRESTOR) 40 MG tablet, Take 1 tablet (40 mg total) by mouth daily., Disp: 90 tablet, Rfl: 1 .  traZODone (DESYREL) 100 MG tablet, Take 1 tablet (100 mg total) by mouth at bedtime., Disp: 90 tablet, Rfl: 0 .  vitamin C (ASCORBIC ACID) 500 MG tablet, Take 500 mg by mouth daily., Disp: , Rfl:  .  XARELTO 20 MG TABS tablet, TAKE ONE TABLET BY MOUTH DAILY WITH SUPPER, Disp: 90 tablet, Rfl: 1  Allergies  Allergen Reactions  . Coreg [Carvedilol] Other (See Comments)    hypotension  . Flomax [Tamsulosin Hcl] Other (See Comments)    Hypotension    I personally reviewed active problem list, medication list, allergies, family history, social history, health maintenance with the patient/caregiver today.   ROS  Constitutional: Negative for fever or weight change.  Respiratory: Negative for cough but has  shortness of breath with activity    Cardiovascular: Negative for chest pain or palpitations.  Gastrointestinal: Negative for abdominal pain, no bowel changes.  Musculoskeletal: Positive for gait problem , she has pain on left outer hip Skin: Negative for rash.   Neurological: Negative for dizziness or headache.  No other specific complaints in a complete review of systems (except as listed in HPI  above).  Objective  Vitals:   01/25/19 0833  BP: 120/78  Pulse: 62  Resp: 16  Temp: 98.2 F (36.8 C)  TempSrc: Temporal  SpO2: 96%  Weight: 204 lb 9.6 oz (92.8 kg)  Height: 5\' 6"  (1.676 m)    Body mass index is 33.02 kg/m.  Physical Exam  Constitutional: Patient appears well-developed and well-nourished. Obese No distress.  HEENT: head atraumatic, normocephalic, pupils equal and reactive to light Cardiovascular: Normal rate, irregular rate and rhythm  No murmur heard. No BLE edema. Pulmonary/Chest: Effort normal and breath sounds normal. No respiratory distress. Abdominal: Soft.  There is no tenderness. Psychiatric: Patient has a normal mood and affect. behavior is normal. Judgment and thought content normal.  Recent Results (from the past 2160 hour(s))  CUP PACEART REMOTE DEVICE CHECK     Status: None   Collection Time: 12/04/18  5:25 AM  Result Value Ref Range   Date Time Interrogation Session [id number]    Pulse Generator Manufacturer MERM    Pulse Gen Model [id number] Evera MRI XT DR    Pulse Gen Serial Number [id number]    Clinic [facility]    Implantable Pulse Generator Type Implantable Cardiac Defibulator    Implantable Pulse Generator Implant Date [id number]    Implantable Lead Manufacturer [facility]    Implantable Lead Model 5076 CapSureFix Novus    Implantable Lead Serial Number [id number]    Implantable Lead Implant Date ZQ:[id number]    Implantable Lead Location Detail 1 APPENDAGE    Implantable Lead Location [id number]    Implantable Lead Manufacturer [facility]    Implantable Lead Model 6935 Sprint Quattro Secure S    Implantable Lead Serial Number [id number]    Implantable Lead Implant Date ZQ:6173695    Implantable Lead Location Detail 1 APEX    Implantable Lead Location [id number]    Lead Channel Setting Sensing Sensitivity 0.3 mV   Lead Channel Setting Pacing Pulse Width 0.4 ms   Lead Channel Setting Pacing Amplitude 2.5 V   Lead Channel Impedance Value 494 ohm    Lead Channel Impedance Value 608 ohm   Lead Channel Impedance Value 437 ohm   Lead Channel Sensing Intrinsic Amplitude 7.375 mV   Lead  Channel Sensing Intrinsic Amplitude 7.375 mV   Lead Channel Pacing Threshold Amplitude 0.75 V   Lead Channel Pacing Threshold Pulse Width 0.4 ms   HighPow Impedance 67 ohm   Battery Status OK    Battery Remaining Longevity 117 mo   Battery Voltage 3.01 V   Brady Statistic RA Percent Paced 0 %   Brady Statistic RV Percent Paced 4.35 %   Brady Statistic AP VP Percent 0 %   Brady Statistic AS VP Percent 2.94 %   [employer] AP VS Percent 0 %   Brady Statistic AS VS Percent 97.06 %      PHQ2/9: Depression screen [facility] 2/9 01/25/2019 07/17/2018 05/15/2018 02/08/2018 08/28/2017  Decreased Interest 0 0 0 0 1  Down, Depressed, Hopeless 0 0 0 0 0  PHQ - 2 Score 0 0 0 0 1  Altered sleeping 0 0 - - 1  Tired, decreased energy 0 0 - - 0  Change in appetite 0 0 - - 1  Feeling bad or failure about yourself  0 0 - - 0  Trouble concentrating 0 0 - - 0  Moving slowly or fidgety/restless 0 0 - - 0  Suicidal thoughts 0 0 - - 0  PHQ-9 Score 0 0 - - 3  Difficult doing work/chores - Not difficult at all - - Somewhat difficult    phq 9 is negative   Fall Risk: Fall Risk  01/25/2019 07/17/2018 05/15/2018 02/08/2018 08/28/2017  Falls in the past year? 1 0 0 No No  Number falls in past yr: 1 0 0 - -  Injury with Fall? 1 0 - - -     Functional Status Survey: Is the patient deaf or have difficulty hearing?: Yes Does the patient have difficulty seeing, even when wearing glasses/contacts?: No Does the patient have difficulty concentrating, remembering, or making decisions?: No Does the patient have difficulty walking or climbing stairs?: No Does the patient have difficulty dressing or bathing?: No Does the patient have difficulty doing errands alone such as visiting a doctor's office or shopping?: No    Assessment & Plan   1. Dyslipidemia  - ezetimibe (ZETIA)  10 MG tablet; Take 1 tablet (10 mg total) by mouth daily.  Dispense: 90 tablet; Refill: 1 - rosuvastatin (CRESTOR) 40 MG tablet; Take 1 tablet (40 mg total) by mouth daily.  Dispense: 90 tablet; Refill: 1  2. Primary insomnia  - traZODone (DESYREL) 100 MG tablet; Take 1 tablet (100 mg total) by mouth at bedtime.  Dispense: 90 tablet; Refill: 1  3. Chronic systolic heart failure (HCC)  - lisinopril (ZESTRIL) 2.5 MG tablet; Take 1 tablet (2.5 mg total) by mouth daily.  Dispense: 90 tablet; Refill: 0  4. OSA on CPAP  Compliant   5. Essential tremor  Contact neurologist to set up [facility]  6. Hyperglycemia  Recheck labs next time   7. Trochanteric bursitis of left hip  Discussed foam rolling, consider going back to chiropractor

## 2019-01-31 ENCOUNTER — Telehealth: Payer: Self-pay | Admitting: Urology

## 2019-01-31 DIAGNOSIS — N401 Enlarged prostate with lower urinary tract symptoms: Secondary | ICD-10-CM

## 2019-01-31 MED ORDER — FINASTERIDE 5 MG PO TABS: 5.0000 mg | ORAL_TABLET | Freq: Every day | ORAL | 0 refills | Status: DC

## 2019-01-31 NOTE — Telephone Encounter (Signed)
Pt needs a refill for Finasteride sent to [facility].  He has appt scheduled for December.

## 2019-01-31 NOTE — Telephone Encounter (Signed)
RX sent

## 2019-02-01 DIAGNOSIS — G4733 Obstructive sleep apnea (adult) (pediatric): Secondary | ICD-10-CM | POA: Diagnosis not present

## 2019-02-13 ENCOUNTER — Other Ambulatory Visit: Payer: Self-pay | Admitting: Internal Medicine

## 2019-02-13 NOTE — Telephone Encounter (Signed)
Please schedule F/U with Dr. Caryl Comes. Thank you!

## 2019-02-20 NOTE — Telephone Encounter (Signed)
lmov to schedule  °

## 2019-02-28 ENCOUNTER — Other Ambulatory Visit: Payer: Self-pay | Admitting: Family Medicine

## 2019-02-28 DIAGNOSIS — E785 Hyperlipidemia, unspecified: Secondary | ICD-10-CM

## 2019-03-05 ENCOUNTER — Ambulatory Visit (INDEPENDENT_AMBULATORY_CARE_PROVIDER_SITE_OTHER): Payer: PPO | Admitting: *Deleted

## 2019-03-05 DIAGNOSIS — I482 Chronic atrial fibrillation, unspecified: Secondary | ICD-10-CM

## 2019-03-05 DIAGNOSIS — I5022 Chronic systolic (congestive) heart failure: Secondary | ICD-10-CM

## 2019-03-05 LAB — CUP PACEART REMOTE DEVICE CHECK
Battery Remaining Longevity: 114 mo
Battery Voltage: 3.01 V
Brady Statistic AP VP Percent: 0 %
Brady Statistic AP VS Percent: 0 %
Brady Statistic AS VP Percent: 3.89 %
Brady Statistic AS VS Percent: 96.11 %
Brady Statistic RA Percent Paced: 0 %
Brady Statistic RV Percent Paced: 4.35 %
Date Time Interrogation Session: 20201027084225
HighPow Impedance: 67 Ohm
Implantable Lead Implant Date: 20130502
Implantable Lead Implant Date: 20130502
Implantable Lead Location: 753859
Implantable Lead Location: 753860
Implantable Lead Model: 5076
Implantable Lead Model: 6935
Implantable Pulse Generator Implant Date: 20181031
Lead Channel Impedance Value: 380 Ohm
Lead Channel Impedance Value: 494 Ohm
Lead Channel Impedance Value: 551 Ohm
Lead Channel Pacing Threshold Amplitude: 0.625 V
Lead Channel Pacing Threshold Pulse Width: 0.4 ms
Lead Channel Sensing Intrinsic Amplitude: 6.5 mV
Lead Channel Sensing Intrinsic Amplitude: 6.5 mV
Lead Channel Setting Pacing Amplitude: 2.5 V
Lead Channel Setting Pacing Pulse Width: 0.4 ms
Lead Channel Setting Sensing Sensitivity: 0.3 mV

## 2019-03-22 DIAGNOSIS — M9901 Segmental and somatic dysfunction of cervical region: Secondary | ICD-10-CM | POA: Diagnosis not present

## 2019-03-22 DIAGNOSIS — M5136 Other intervertebral disc degeneration, lumbar region: Secondary | ICD-10-CM | POA: Diagnosis not present

## 2019-03-22 DIAGNOSIS — M50322 Other cervical disc degeneration at C5-C6 level: Secondary | ICD-10-CM | POA: Diagnosis not present

## 2019-03-22 DIAGNOSIS — M9903 Segmental and somatic dysfunction of lumbar region: Secondary | ICD-10-CM | POA: Diagnosis not present

## 2019-03-25 DIAGNOSIS — M50322 Other cervical disc degeneration at C5-C6 level: Secondary | ICD-10-CM | POA: Diagnosis not present

## 2019-03-25 DIAGNOSIS — M9901 Segmental and somatic dysfunction of cervical region: Secondary | ICD-10-CM | POA: Diagnosis not present

## 2019-03-25 DIAGNOSIS — M9903 Segmental and somatic dysfunction of lumbar region: Secondary | ICD-10-CM | POA: Diagnosis not present

## 2019-03-25 DIAGNOSIS — M5136 Other intervertebral disc degeneration, lumbar region: Secondary | ICD-10-CM | POA: Diagnosis not present

## 2019-03-26 NOTE — Progress Notes (Signed)
Remote ICD transmission.   

## 2019-03-27 DIAGNOSIS — M50322 Other cervical disc degeneration at C5-C6 level: Secondary | ICD-10-CM | POA: Diagnosis not present

## 2019-03-27 DIAGNOSIS — M9901 Segmental and somatic dysfunction of cervical region: Secondary | ICD-10-CM | POA: Diagnosis not present

## 2019-03-27 DIAGNOSIS — M5136 Other intervertebral disc degeneration, lumbar region: Secondary | ICD-10-CM | POA: Diagnosis not present

## 2019-03-27 DIAGNOSIS — M9903 Segmental and somatic dysfunction of lumbar region: Secondary | ICD-10-CM | POA: Diagnosis not present

## 2019-03-29 DIAGNOSIS — M9901 Segmental and somatic dysfunction of cervical region: Secondary | ICD-10-CM | POA: Diagnosis not present

## 2019-03-29 DIAGNOSIS — M5136 Other intervertebral disc degeneration, lumbar region: Secondary | ICD-10-CM | POA: Diagnosis not present

## 2019-03-29 DIAGNOSIS — M9903 Segmental and somatic dysfunction of lumbar region: Secondary | ICD-10-CM | POA: Diagnosis not present

## 2019-03-29 DIAGNOSIS — M50322 Other cervical disc degeneration at C5-C6 level: Secondary | ICD-10-CM | POA: Diagnosis not present

## 2019-04-01 DIAGNOSIS — M50322 Other cervical disc degeneration at C5-C6 level: Secondary | ICD-10-CM | POA: Diagnosis not present

## 2019-04-01 DIAGNOSIS — M9901 Segmental and somatic dysfunction of cervical region: Secondary | ICD-10-CM | POA: Diagnosis not present

## 2019-04-01 DIAGNOSIS — M9903 Segmental and somatic dysfunction of lumbar region: Secondary | ICD-10-CM | POA: Diagnosis not present

## 2019-04-01 DIAGNOSIS — M5136 Other intervertebral disc degeneration, lumbar region: Secondary | ICD-10-CM | POA: Diagnosis not present

## 2019-04-03 DIAGNOSIS — M50322 Other cervical disc degeneration at C5-C6 level: Secondary | ICD-10-CM | POA: Diagnosis not present

## 2019-04-03 DIAGNOSIS — M5136 Other intervertebral disc degeneration, lumbar region: Secondary | ICD-10-CM | POA: Diagnosis not present

## 2019-04-03 DIAGNOSIS — M9903 Segmental and somatic dysfunction of lumbar region: Secondary | ICD-10-CM | POA: Diagnosis not present

## 2019-04-03 DIAGNOSIS — M9901 Segmental and somatic dysfunction of cervical region: Secondary | ICD-10-CM | POA: Diagnosis not present

## 2019-04-08 DIAGNOSIS — M9903 Segmental and somatic dysfunction of lumbar region: Secondary | ICD-10-CM | POA: Diagnosis not present

## 2019-04-08 DIAGNOSIS — M5136 Other intervertebral disc degeneration, lumbar region: Secondary | ICD-10-CM | POA: Diagnosis not present

## 2019-04-08 DIAGNOSIS — M50322 Other cervical disc degeneration at C5-C6 level: Secondary | ICD-10-CM | POA: Diagnosis not present

## 2019-04-08 DIAGNOSIS — M9901 Segmental and somatic dysfunction of cervical region: Secondary | ICD-10-CM | POA: Diagnosis not present

## 2019-04-08 NOTE — Telephone Encounter (Signed)
lmov to schedule  °

## 2019-04-10 ENCOUNTER — Telehealth: Payer: Self-pay | Admitting: Urology

## 2019-04-10 ENCOUNTER — Other Ambulatory Visit: Payer: Self-pay

## 2019-04-10 ENCOUNTER — Ambulatory Visit: Payer: PPO | Admitting: Urology

## 2019-04-10 ENCOUNTER — Encounter: Payer: Self-pay | Admitting: Urology

## 2019-04-10 VITALS — BP 121/77 | HR 79 | Ht 68.0 in | Wt 214.0 lb

## 2019-04-10 DIAGNOSIS — N401 Enlarged prostate with lower urinary tract symptoms: Secondary | ICD-10-CM | POA: Diagnosis not present

## 2019-04-10 DIAGNOSIS — N5203 Combined arterial insufficiency and corporo-venous occlusive erectile dysfunction: Secondary | ICD-10-CM | POA: Diagnosis not present

## 2019-04-10 LAB — BLADDER SCAN AMB NON-IMAGING: Scan Result: 66

## 2019-04-10 MED ORDER — FINASTERIDE 5 MG PO TABS: 5.0000 mg | ORAL_TABLET | Freq: Every day | ORAL | 3 refills | Status: DC

## 2019-04-10 NOTE — Progress Notes (Signed)
04/10/2019 9:44 AM   Chris Price 1940-09-06 [id number]  Referring provider: [name], MD [address] [place],  [place] [address]  Chief Complaint  Patient presents with  . Benign Prostatic Hypertrophy    HPI: 78 year old male who returns today for routine annual follow-up for BPH/ ED.  He has a personal history of lower urinary tract symptoms status post TURP in 2013.  He is continued taking finasteride since then with good result.  He was taken off Flomax little over a year ago for orthostasis.  His urinary symptoms are well controlled.  IPSS as below.  PVR is low.  He does have a personal history of retention remotely.   He reports today that he has been taking lasix every other day for CHF and has increased frequency on these days but is able to plan for this.  He is not bothered by this.    No UTIs or dysuria.    PSA screening has been discontinued secondary to the patient's age.  Most recent PSA in 2017 was within normal limits.  He is also had a remote prostate biopsy which was also negative.  He does have personal history of erectile dysfunction as previously described sildenafil for this.  He used only one tablet which was not effective.       IPSS    Row Name 04/10/19 0800         International Prostate Symptom Score   How often have you had the sensation of not emptying your bladder?  Less than 1 in 5     How often have you had to urinate less than every two hours?  Less than half the time     How often have you found you stopped and started again several times when you urinated?  Less than half the time     How often have you found it difficult to postpone urination?  Not at All     How often have you had a weak urinary stream?  Less than 1 in 5 times     How often have you had to strain to start urination?  Less than 1 in 5 times     How many times did you typically get up at night to urinate?  1 Time     Total IPSS Score  8       Quality of Life due to urinary symptoms   If you were to spend the rest of your life with your urinary condition just the way it is now how would you feel about that?  Delighted        Score:  1-7 Mild 8-19 Moderate 20-35 Severe   PMH: Past Medical History:  Diagnosis Date  . Atrial fibrillation ([place])   . Cardiomyopathy -nonischemic    moderate to severe EF 25-30% March 2013  . CHF (congestive heart failure) ([place])   . Chronic systolic heart failure ([place]) 08/25/2011  . Coronary artery disease 08/15/2011   Moderate nonobstructive CAD with 60% mid LAD, 60% proximal RCA and 30 % stenossis left circumflex. Cardiac catheterization in March of 2015 showed no significant change   . Degenerative disk disease    lumbar  . Disc herniation   . GERD (gastroesophageal reflux disease)   . Hyperlipidemia   . Hypertension   . ICD (implantable cardiac defibrillator) in place   . IVCD (intraventricular conduction defect)    Nonspecific QRS duration 122  . Lumbar spinal stenosis   .  Neuromuscular disorder ([place])    neauropathy   / siatica  . Persistent atrial fibrillation ([place]) 11/07/2011  . Sleep apnea    uses cpap    Surgical History: Past Surgical History:  Procedure Laterality Date  . CARDIAC CAT[facility]ATION  [facility]   [facility]   . CARDIAC CATHETERIZATION  07/09/2013   [facility]  . CARDIOVERSION  08/2011   [facility]  . CHOLECYSTECTOMY  April 2015  . COLONOSCOPY WITH PROPOFOL N/A 07/30/2015   Procedure: COLONOSCOPY WITH PROPOFOL;  Surgeon: Lollie Sails, MD;  Location: [facility] ENDOSCOPY;  Service: Endoscopy;  Laterality: N/A;  . cooled thermotherapy  [id number]   [place], complicated by incontinence  . HERNIA REPAIR    . ICD  09/08/2011  . ICD GENERATOR CHANGEOUT N/A 03/08/2017   Procedure: ICD GENERATOR CHANGEOUT;  Surgeon: [name], MD;  Location: [place] CV LAB;  Service: Cardiovascular;  Laterality: N/A;  . IMPLANTABLE CARDIOVERTER DEFIBRILLATOR IMPLANT  09/08/2011   Procedure: IMPLANTABLE  CARDIOVERTER DEFIBRILLATOR IMPLANT;  Surgeon: [name], MD;  Location: [facility] CATH LAB;  Service: Cardiovascular;;  . LUMBAR LAMINECTOMY     L2 through S1 with wide decompression of the thecal sac and nerve roots.  Marland Kitchen PROSTATE BIOPSY  2013    Home Medications:  Allergies as of 04/10/2019      Reactions   Coreg [carvedilol] Other (See Comments)   hypotension   Flomax [tamsulosin Hcl] Other (See Comments)   Hypotension      Medication List       Accurate as of April 10, 2019  9:44 AM. If you have any questions, ask your nurse or doctor.        bisoprolol 5 MG tablet Commonly known as: ZEBETA TAKE ONE-HALF TABLET BY MOUTH DAILY   ezetimibe 10 MG tablet Commonly known as: ZETIA Take 1 tablet (10 mg total) by mouth daily.   finasteride 5 MG tablet Commonly known as: PROSCAR Take 1 tablet (5 mg total) by mouth daily.   furosemide 20 MG tablet Commonly known as: LASIX Take 2 tablets (40 mg) by mouth once every other day as directed   lisinopril 2.5 MG tablet Commonly known as: Zestril Take 1 tablet (2.5 mg total) by mouth daily.   multivitamin Tabs tablet Take 1 tablet by mouth daily.   nitroGLYCERIN 0.4 MG SL tablet Commonly known as: NITROSTAT Place 1 tablet (0.4 mg total) under the tongue every 5 (five) minutes as needed for chest pain.   omeprazole 20 MG capsule Commonly known as: PRILOSEC TAKE 1 CAPSULE 2 TIMES DAILY BEFORE A MEAL. FOR ACID REFLUX. What changed:   how much to take  how to take this  when to take this  reasons to take this  additional instructions   rosuvastatin 40 MG tablet Commonly known as: CRESTOR TAKE ONE TABLET BY MOUTH DAILY   traZODone 100 MG tablet Commonly known as: DESYREL Take 1 tablet (100 mg total) by mouth at bedtime.   vitamin C 500 MG tablet Commonly known as: ASCORBIC ACID Take 500 mg by mouth daily.   Xarelto 20 MG Tabs tablet Generic drug: rivaroxaban TAKE ONE TABLET BY MOUTH DAILY WITH SUPPER        Allergies:  Allergies  Allergen Reactions  . Coreg [Carvedilol] Other (See Comments)    hypotension  . Flomax [Tamsulosin Hcl] Other (See Comments)    Hypotension    Family History: Family History  Problem Relation Age of Onset  . Heart attack Father   . Heart  attack Brother   . Heart attack Son   . Ovarian cancer Daughter     Social History:  reports that he has never smoked. He has never used smokeless tobacco. He reports that he does not drink alcohol or use drugs.  ROS: UROLOGY Frequent Urination?: No Hard to postpone urination?: No Burning/pain with urination?: No Get up at night to urinate?: No Leakage of urine?: No Urine stream starts and stops?: No Trouble starting stream?: No Do you have to strain to urinate?: No Blood in urine?: No Urinary tract infection?: No Sexually transmitted disease?: No Injury to kidneys or bladder?: No Painful intercourse?: No Weak stream?: No Erection problems?: No Penile pain?: No  Gastrointestinal Nausea?: No Vomiting?: No Indigestion/heartburn?: No Diarrhea?: No Constipation?: No  Constitutional Fever: No Night sweats?: No Weight loss?: No Fatigue?: No  Skin Skin rash/lesions?: No Itching?: No  Eyes Blurred vision?: No Double vision?: No  Ears/Nose/Throat Sore throat?: No Sinus problems?: No  Hematologic/Lymphatic Swollen glands?: No Easy bruising?: No  Cardiovascular Leg swelling?: No Chest pain?: No  Respiratory Cough?: No Shortness of breath?: No  Endocrine Excessive thirst?: No  Musculoskeletal Back pain?: Yes Joint pain?: No  Neurological Headaches?: No Dizziness?: No  Psychologic Depression?: No Anxiety?: No  Physical Exam: BP 121/77   Pulse 79   Ht 5\' 8"  (1.727 m)   Wt 214 lb (97.1 kg)   BMI 32.54 kg/m   Constitutional:  Alert and oriented, No acute distress. HEENT: [place] AT, moist mucus membranes.  Trachea midline, no masses. Cardiovascular: No clubbing, cyanosis, or edema.  Respiratory: Normal respiratory effort, no increased work of breathing. Skin: No rashes, bruises or suspicious lesions. Neurologic: Grossly intact, no focal deficits, moving all 4 extremities. Psychiatric: Normal mood and affect.  Laboratory Data: Lab Results  Component Value Date   WBC 6.6 07/17/2018   HGB 14.8 07/17/2018   HCT 44.3 07/17/2018   MCV 94.7 07/17/2018   PLT 206 07/17/2018    Lab Results  Component Value Date   CREATININE 0.96 07/17/2018     Lab Results  Component Value Date   HGBA1C 5.9 (H) 07/17/2018    Pertinent Imaging: Results for orders placed or performed in visit on 04/10/19  Bladder Scan (Post Void Residual) in office  Result Value Ref Range   Scan Result 66     Assessment & Plan:    1. Benign localized prostatic hyperplasia with lower urinary tract symptoms (LUTS) Symptoms well controlled/ stable on finasteride only  Adequate bladder emptying  Given stability of symptoms, either f/u annually or with PCP as desired  - Bladder Scan (Post Void Residual) in office  2. Combined arterial insufficiency and corporo-venous occlusive erectile dysfunction Minimal bother  If he is going to use sildenafil, he MAY NOT use nitroglycerine which is an absolute contraindication- patient understands this, states he does not have nitroglycerine anymore  - Bladder Scan (Post Void Residual) in office   Return in about 1 year (around 04/09/2020) for IPSS/ PVR with PA or f/u wtih PCP.  [name], MD  [facility] [address], [place], [place] [address] [phone]

## 2019-04-10 NOTE — Telephone Encounter (Signed)
FYI  Pt did not wish to make 1 yr follow up, states he was told he didn't have to make an appt, to call as needed if needed. He has chosen to call as needed.

## 2019-04-12 DIAGNOSIS — M5136 Other intervertebral disc degeneration, lumbar region: Secondary | ICD-10-CM | POA: Diagnosis not present

## 2019-04-12 DIAGNOSIS — M9901 Segmental and somatic dysfunction of cervical region: Secondary | ICD-10-CM | POA: Diagnosis not present

## 2019-04-12 DIAGNOSIS — M9903 Segmental and somatic dysfunction of lumbar region: Secondary | ICD-10-CM | POA: Diagnosis not present

## 2019-04-12 DIAGNOSIS — M50322 Other cervical disc degeneration at C5-C6 level: Secondary | ICD-10-CM | POA: Diagnosis not present

## 2019-04-17 DIAGNOSIS — M5136 Other intervertebral disc degeneration, lumbar region: Secondary | ICD-10-CM | POA: Diagnosis not present

## 2019-04-17 DIAGNOSIS — M9903 Segmental and somatic dysfunction of lumbar region: Secondary | ICD-10-CM | POA: Diagnosis not present

## 2019-04-17 DIAGNOSIS — M9901 Segmental and somatic dysfunction of cervical region: Secondary | ICD-10-CM | POA: Diagnosis not present

## 2019-04-17 DIAGNOSIS — M50322 Other cervical disc degeneration at C5-C6 level: Secondary | ICD-10-CM | POA: Diagnosis not present

## 2019-04-22 DIAGNOSIS — M9901 Segmental and somatic dysfunction of cervical region: Secondary | ICD-10-CM | POA: Diagnosis not present

## 2019-04-22 DIAGNOSIS — M50322 Other cervical disc degeneration at C5-C6 level: Secondary | ICD-10-CM | POA: Diagnosis not present

## 2019-04-22 DIAGNOSIS — M5136 Other intervertebral disc degeneration, lumbar region: Secondary | ICD-10-CM | POA: Diagnosis not present

## 2019-04-22 DIAGNOSIS — M9903 Segmental and somatic dysfunction of lumbar region: Secondary | ICD-10-CM | POA: Diagnosis not present

## 2019-04-24 DIAGNOSIS — M9901 Segmental and somatic dysfunction of cervical region: Secondary | ICD-10-CM | POA: Diagnosis not present

## 2019-04-24 DIAGNOSIS — M9903 Segmental and somatic dysfunction of lumbar region: Secondary | ICD-10-CM | POA: Diagnosis not present

## 2019-04-24 DIAGNOSIS — M5136 Other intervertebral disc degeneration, lumbar region: Secondary | ICD-10-CM | POA: Diagnosis not present

## 2019-04-24 DIAGNOSIS — M50322 Other cervical disc degeneration at C5-C6 level: Secondary | ICD-10-CM | POA: Diagnosis not present

## 2019-04-29 DIAGNOSIS — M5136 Other intervertebral disc degeneration, lumbar region: Secondary | ICD-10-CM | POA: Diagnosis not present

## 2019-04-29 DIAGNOSIS — M50322 Other cervical disc degeneration at C5-C6 level: Secondary | ICD-10-CM | POA: Diagnosis not present

## 2019-04-29 DIAGNOSIS — M9903 Segmental and somatic dysfunction of lumbar region: Secondary | ICD-10-CM | POA: Diagnosis not present

## 2019-04-29 DIAGNOSIS — M9901 Segmental and somatic dysfunction of cervical region: Secondary | ICD-10-CM | POA: Diagnosis not present

## 2019-04-29 NOTE — Telephone Encounter (Signed)
Scheduled

## 2019-05-01 ENCOUNTER — Telehealth: Payer: Self-pay | Admitting: Family Medicine

## 2019-05-01 NOTE — Telephone Encounter (Signed)
Faxed to [phone] and received confirmation sheet

## 2019-05-01 NOTE — Telephone Encounter (Signed)
Pt called to see if [facility] has gotten an Rx for a new Cpap machine. Pts cpap has stopped working and [facility] is asking for an Rx asap/ please advise  [facility] [phone]

## 2019-05-06 DIAGNOSIS — M5136 Other intervertebral disc degeneration, lumbar region: Secondary | ICD-10-CM | POA: Diagnosis not present

## 2019-05-06 DIAGNOSIS — M9901 Segmental and somatic dysfunction of cervical region: Secondary | ICD-10-CM | POA: Diagnosis not present

## 2019-05-06 DIAGNOSIS — M50322 Other cervical disc degeneration at C5-C6 level: Secondary | ICD-10-CM | POA: Diagnosis not present

## 2019-05-06 DIAGNOSIS — M9903 Segmental and somatic dysfunction of lumbar region: Secondary | ICD-10-CM | POA: Diagnosis not present

## 2019-05-13 ENCOUNTER — Other Ambulatory Visit: Payer: Self-pay | Admitting: Internal Medicine

## 2019-05-13 DIAGNOSIS — M5136 Other intervertebral disc degeneration, lumbar region: Secondary | ICD-10-CM | POA: Diagnosis not present

## 2019-05-13 DIAGNOSIS — M50322 Other cervical disc degeneration at C5-C6 level: Secondary | ICD-10-CM | POA: Diagnosis not present

## 2019-05-13 DIAGNOSIS — M9901 Segmental and somatic dysfunction of cervical region: Secondary | ICD-10-CM | POA: Diagnosis not present

## 2019-05-13 DIAGNOSIS — M9903 Segmental and somatic dysfunction of lumbar region: Secondary | ICD-10-CM | POA: Diagnosis not present

## 2019-05-20 ENCOUNTER — Other Ambulatory Visit: Payer: Self-pay | Admitting: Family Medicine

## 2019-05-20 DIAGNOSIS — G4733 Obstructive sleep apnea (adult) (pediatric): Secondary | ICD-10-CM

## 2019-05-21 ENCOUNTER — Ambulatory Visit (INDEPENDENT_AMBULATORY_CARE_PROVIDER_SITE_OTHER): Payer: PPO

## 2019-05-21 ENCOUNTER — Other Ambulatory Visit: Payer: Self-pay

## 2019-05-21 VITALS — BP 100/62 | HR 57 | Temp 96.9°F | Resp 15 | Ht 66.0 in | Wt 216.7 lb

## 2019-05-21 DIAGNOSIS — Z0000 Encounter for general adult medical examination without abnormal findings: Secondary | ICD-10-CM

## 2019-05-21 NOTE — Progress Notes (Signed)
Subjective:   Chris Price is a 79 y.o. male who presents for Medicare Annual/Subsequent preventive examination.  Review of Systems:   Cardiac Risk Factors include: advanced age (>32men, >19 women);dyslipidemia;hypertension;male gender;obesity (BMI >30kg/m2)     Objective:    Vitals: BP 100/62 (BP Location: Left Arm, Patient Position: Sitting, Cuff Size: Large)   Pulse (!) 57   Temp (!) 96.9 F (36.1 C) (Temporal)   Resp 15   Ht 5\' 6"  (1.676 m)   Wt 216 lb 11.2 oz (98.3 kg)   SpO2 98%   BMI 34.98 kg/m   Body mass index is 34.98 kg/m.  Advanced Directives 05/21/2019 05/15/2018 05/12/2017 03/08/2017 05/12/2016 07/30/2015 05/13/2015  Does Patient Have a Medical Advance Directive? Yes Yes Yes Yes Yes No Yes  Type of Paramedic of [place];Living will Living will;Healthcare Power of [place];Living will [place];Living will Living will - [place];Living will  Does patient want to make changes to medical advance directive? - No - Patient declined No - Patient declined No - Patient declined No - Patient declined - -  Copy of [place] in Chart? Yes - validated most recent copy scanned in chart (See row information) Yes - validated most recent copy scanned in chart (See row information) Yes Yes - - Yes  Would patient like information on creating a medical advance directive? - - - - - No - patient declined information -    Tobacco Social History   Tobacco Use  Smoking Status Never Smoker  Smokeless Tobacco Never Used     Counseling given: Not Answered   Clinical Intake:  Pre-visit preparation completed: Yes  Pain : 0-10 Pain Score: 6  Pain Type: Chronic pain Pain Location: Back Pain Orientation: Lower Pain Descriptors / Indicators: Aching, Sore Pain Onset: More than a month ago Pain Frequency: Intermittent     BMI - recorded: 34.98 Nutritional Status: BMI > 30   Obese Nutritional Risks: None Diabetes: No  How often do you need to have someone help you when you read instructions, pamphlets, or other written materials from your doctor or pharmacy?: 1 - Never  Interpreter Needed?: No  Information entered by :: Clemetine Marker LPN  Past Medical History:  Diagnosis Date  . Atrial fibrillation ([place])   . Cardiomyopathy -nonischemic    moderate to severe EF 25-30% March 2013  . CHF (congestive heart failure) ([place])   . Chronic systolic heart failure ([place]) 08/25/2011  . Coronary artery disease 08/15/2011   Moderate nonobstructive CAD with 60% mid LAD, 60% proximal RCA and 30 % stenossis left circumflex. Cardiac catheterization in March of 2015 showed no significant change   . Degenerative disk disease    lumbar  . Disc herniation   . GERD (gastroesophageal reflux disease)   . Hyperlipidemia   . Hypertension   . ICD (implantable cardiac defibrillator) in place   . IVCD (intraventricular conduction defect)    Nonspecific QRS duration 122  . Lumbar spinal stenosis   . Neuromuscular disorder ([place])    neauropathy   / siatica  . Persistent atrial fibrillation ([place]) 11/07/2011  . Sleep apnea    uses cpap   Past Surgical History:  Procedure Laterality Date  . CARDIAC [facility]IZATION  [facility]   . CARDIAC CATHETERIZATION  07/09/2013   [facility]  . CARDIOVERSION  08/2011   [facility]  . CHOLECYSTECTOMY  April 2015  . COLONOSCOPY WITH PROPOFOL  N/A 07/30/2015   Procedure: COLONOSCOPY WITH PROPOFOL;  Surgeon: Lollie Sails, MD;  [facility] END[facility]COPY;  Service: Endoscopy;  Laterality: N/A;  . cooled thermotherapy  [id number]   [place], complicated by incontinence  . HERNIA REPAIR    . ICD  09/08/2011  . ICD GENERATOR CHANGEOUT N/A 03/08/2017   Procedure: ICD GENERATOR CHANGEOUT;  Surgeon: [name], MD;  Location: [facility] CV LAB;  Service: Cardiovascular;  Laterality: N/A;  . IMPLANTABLE CARDIOVERTER DEFIBRILLATOR IMPLANT  09/08/2011   Procedure:  IMPLANTABLE CARDIOVERTER DEFIBRILLATOR IMPLANT;  Surgeon: [name], MD;  Location: [facility] CATH LAB;  Service: Cardiovascular;;  . LUMBAR LAMINECTOMY     L2 through S1 with wide decompression of the thecal sac and nerve roots.  Marland Kitchen PROSTATE BIOPSY  2013   Family History  Problem Relation Age of Onset  . Heart attack Father   . Heart attack Brother   . Heart attack Son   . Ovarian cancer Daughter    Social History   Socioeconomic History  . Marital status: Married    Spouse name: Not on file  . Number of children: 3  . Years of education: Not on file  . Highest education level: 12th grade  Occupational History  . 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 Smoking status: Never Smoker  . Smokeless tobacco: Never Used  Substance and Sexual Activity  . Alcohol use: No  . Drug use: No  . Sexual activity: Not Currently  Other Topics Concern  . Not on file  Social History Narrative   Second marriage, together since 17   He has 3 children from previous marriage and one step-daughter    Social Determinants of Health   Financial Resource Strain: Low Risk   . Difficulty of Paying Living Expenses: Not hard at all  Food Insecurity: No Food Insecurity  . Worried About Charity fundraiser in the Last Year: Never true  . Ran Out of Food in the Last Year: Never true  Transportation Needs: No Transportation Needs  . Lack of Transportation (Medical): No  . Lack of Transportation (Non-Medical): No  Physical Activity: Inactive  . Days of Exercise per Week: 0 days  . Minutes of Exercise per Session: 0 min  Stress: No Stress Concern Present  . Feeling of Stress : Only a little  Social Connections: Somewhat Isolated  . Frequency of Communication with Friends and Family: More than three times a week  . Frequency of Social Gatherings with Friends and Family: Three times a week  . Attends Religious Services: Never  . Active Member of Clubs or Organizations: No    . Attends Archivist Meetings: Never  . Marital Status: Married    Outpatient Encounter Medications as of 05/21/2019  Medication Sig  . bisoprolol (ZEBETA) 5 MG tablet TAKE 1/2 TABLET BY MOUTH DAILY  . ezetimibe (ZETIA) 10 MG tablet Take 1 tablet (10 mg total) by mouth daily.  . finasteride (PROSCAR) 5 MG tablet Take 1 tablet (5 mg total) by mouth daily.  . furosemide (LASIX) 20 MG tablet Take 2 tablets (40 mg) by mouth once every other day as directed  . lisinopril (ZESTRIL) 2.5 MG tablet Take 1 tablet (2.5 mg total) by mouth daily.  . multivitamin (ONE-A-DAY MEN'S) TABS tablet Take 1 tablet by mouth daily.  . nitroGLYCERIN (NITROSTAT) 0.4 MG SL tablet Place 1 tablet (0.4 mg total) under the tongue every 5 (five)  minutes as needed for chest pain.  Marland Kitchen omeprazole (PRILOSEC) 20 MG capsule TAKE 1 CAPSULE 2 TIMES DAILY BEFORE A MEAL. FOR ACID REFLUX. (Patient taking differently: Take 20 mg by mouth 2 (two) times daily as needed (heartburn). )  . rosuvastatin (CRESTOR) 40 MG tablet TAKE ONE TABLET BY MOUTH DAILY  . traZODone (DESYREL) 100 MG tablet Take 1 tablet (100 mg total) by mouth at bedtime.  . vitamin C (ASCORBIC ACID) 500 MG tablet Take 500 mg by mouth daily.  Alveda Reasons 20 MG TABS tablet TAKE ONE TABLET BY MOUTH DAILY WITH SUPPER   No facility-administered encounter medications on file as of 05/21/2019.    Activities of Daily Living In your present state of health, do you have any difficulty performing the following activities: 05/21/2019 01/25/2019  Hearing? Tempie Donning  Comment wears hearing aids -  Vision? N N  Comment - -  Difficulty concentrating or making decisions? N N  Walking or climbing stairs? N N  Dressing or bathing? N N  Doing errands, shopping? N N  Preparing Food and eating ? N -  Using the Toilet? N -  In the past six months, have you accidently leaked urine? N -  Do you have problems with loss of bowel control? N -  Managing your Medications? N -  Managing  your Finances? N -  Some recent data might be hidden    Patient Care Team: [name], MD as PCP - General (Family Medicine) [name], MD as Consulting Physician (Cardiology) Murrell Redden, MD as Consulting Physician (Urology)   Assessment:   This is a routine wellness examination for Marq.  Exercise Activities and Dietary recommendations Current Exercise Habits: The patient does not participate in regular exercise at present, Exercise limited by: orthopedic condition(s)  Goals    . DIET - INCREASE WATER INTAKE    . Increase physical activity     Stretch!! Chair exercises as demonstrated, increase as tolerated Silver sneaker water aerobics 3 times weekly, 45-60 minutes        Fall Risk Fall Risk  05/21/2019 01/25/2019 07/17/2018 05/15/2018 02/08/2018  Falls in the past year? 0 1 0 0 No  Number falls in past yr: 0 1 0 0 -  Injury with Fall? 0 1 0 - -  Follow up Falls prevention discussed - - - -   FALL RISK PREVENTION PERTAINING TO THE HOME:  Any stairs in or around the home? Yes  If so, do they handrails? Yes   Home free of loose throw rugs in walkways, pet beds, electrical cords, etc? Yes  Adequate lighting in your home to reduce risk of falls? Yes   ASSISTIVE DEVICES UTILIZED TO PREVENT FALLS:  Life alert? No  Use of a cane, walker or w/c? No  Grab bars in the bathroom? Yes  Shower chair or bench in shower? No  Elevated toilet seat or a handicapped toilet? Yes   DME ORDERS:  DME order needed?  No   TIMED UP AND GO:  Was the test performed? Yes .  Length of time to ambulate 10 feet: 6 sec.   GAIT:  Appearance of gait: Gait stead-fast and without the use of an assistive device.   Education: Fall risk prevention has been discussed.  Intervention(s) required? No   Depression Screen PHQ 2/9 Scores 05/21/2019 01/25/2019 07/17/2018 05/15/2018  PHQ - 2 Score 0 0 0 0  PHQ- 9 Score - 0 0 -    Cognitive Function MMSE -  Mini Mental State Exam 05/12/2016  05/13/2015  Orientation to time 5 5  Orientation to Place 5 5  Registration 3 3  Attention/ Calculation 5 5  Recall 3 3  Language- name 2 objects 2 2  Language- repeat 1 1  Language- follow 3 step command 3 3  Language- read & follow direction 1 1  Write a sentence 1 1  Copy design 1 1  Total score 30 30     6CIT Screen 05/21/2019 05/15/2018 05/12/2017  What Year? 0 points 0 points 0 points  What month? 0 points 0 points 0 points  What time? 0 points 0 points 0 points  Count back from 20 0 points 0 points 0 points  Months in reverse 0 points 2 points 0 points  Repeat phrase 4 points 2 points 0 points  Total Score 4 4 0    Immunization History  Administered Date(s) Administered  . Fluad Quad(high Dose 65+) 01/25/2019  . Influenza Split 02/19/2012  . Influenza-Unspecified 02/08/2013, 02/09/2016, 02/20/2017, 02/02/2018  . Pneumococcal Conjugate-13 05/14/2015  . Pneumococcal Polysaccharide-23 03/21/2012  . Tdap 02/28/2011    Qualifies for Shingles Vaccine? Yes . Due for Shingrix. Education has been provided regarding the importance of this vaccine. Pt has been advised to call insurance company to determine out of pocket expense. Advised may also receive vaccine at local pharmacy or Health Dept. Verbalized acceptance and understanding.  Tdap: Up to date  Flu Vaccine: Up to date  Pneumococcal Vaccine: Up to date   Screening Tests Health Maintenance  Topic Date Due  . TETANUS/TDAP  02/27/2021  . INFLUENZA VACCINE  Completed  . PNA vac Low Risk Adult  Completed   Cancer Screenings:  Colorectal Screening: No longer required.   Lung Cancer Screening: (Low Dose CT Chest recommended if Age 77-80 years, 30 pack-year currently smoking OR have quit w/in 15years.) does not qualify.    Additional Screening:  Hepatitis C Screening: no longer required  Vision Screening: Recommended annual ophthalmology exams for early detection of glaucoma and other disorders of the eye. Is the  patient up to date with their annual eye exam?  No - postponed due to Covid Who is the provider or what is the name of the office in which the pt attends annual eye exams? [place] Screening: Recommended annual dental exams for proper oral hygiene  Community Resource Referral:  CRR required this visit?  No       Plan:    I have personally reviewed and addressed the Medicare Annual Wellness questionnaire and have noted the following in the patient's chart:  A. Medical and social history B. Use of alcohol, tobacco or illicit drugs  C. Current medications and supplements D. Functional ability and status E.  Nutritional status F.  Physical activity G. Advance directives H. List of other physicians I.  Hospitalizations, surgeries, and ER visits in previous 12 months J.  [place] such as hearing and vision if needed, cognitive and depression L. Referrals and appointments   In addition, I have reviewed and discussed with patient certain preventive protocols, quality metrics, and best practice recommendations. A written personalized care plan for preventive services as well as general preventive health recommendations were provided to patient.   Signed,  Clemetine Marker, LPN Nurse Health Advisor   Nurse Notes: pt c/o hands shaking, especially when stressed. He states he has been to neurology who recommends going back to cardiologist to see if there are any other medications  they recommend and has upcoming appt with cardiology. He also c/o low back and left hip pain but has been going to [facility] and having some relief. Pt advised to let us know if sxs worsen or persist.

## 2019-05-21 NOTE — Patient Instructions (Signed)
[name] , Thank you for taking time to come for your Medicare Wellness Visit. I appreciate your ongoing commitment to your health goals. Please review the following plan we discussed and let me know if I can assist you in the future.   Screening recommendations/referrals: Colonoscopy: no longer required Recommended yearly ophthalmology/optometry visit for glaucoma screening and checkup Recommended yearly dental visit for hygiene and checkup  Vaccinations: Influenza vaccine: done 01/25/19 Pneumococcal vaccine: done 05/14/15 Tdap vaccine: done 02/28/11 Shingles vaccine: Shingrix discussed. Please contact your pharmacy for coverage information.   Conditions/risks identified: Recommend increasing physical activity as tolerated.   Next appointment: Please follow up in one year for your Medicare Annual Wellness visit.    Preventive Care 81 Years and Older, Male Preventive care refers to lifestyle choices and visits with your health care provider that can promote health and wellness. What does preventive care include?  A yearly physical exam. This is also called an annual well check.  Dental exams once or twice a year.  Routine eye exams. Ask your health care provider how often you should have your eyes checked.  Personal lifestyle choices, including:  Daily care of your teeth and gums.  Regular physical activity.  Eating a healthy diet.  Avoiding tobacco and drug use.  Limiting alcohol use.  Practicing safe sex.  Taking low doses of aspirin every day.  Taking vitamin and mineral supplements as recommended by your health care provider. What happens during an annual well check? The services and screenings done by your health care provider during your annual well check will depend on your age, overall health, lifestyle risk factors, and family history of disease. Counseling  Your health care provider may ask you questions about your:  Alcohol use.  Tobacco use.  Drug  use.  Emotional well-being.  Home and relationship well-being.  Sexual activity.  Eating habits.  History of falls.  Memory and ability to understand (cognition).  Work and work Statistician. Screening  You may have the following tests or measurements:  Height, weight, and BMI.  Blood pressure.  Lipid and cholesterol levels. These may be checked every 5 years, or more frequently if you are over 81 years old.  Skin check.  Lung cancer screening. You may have this screening every year starting at age 81 if you have a 30-pack-year history of smoking and currently smoke or have quit within the past 15 years.  Fecal occult blood test (FOBT) of the stool. You may have this test every year starting at age 81.  Flexible sigmoidoscopy or colonoscopy. You may have a sigmoidoscopy every 5 years or a colonoscopy every 10 years starting at age 81.  Prostate cancer screening. Recommendations will vary depending on your family history and other risks.  Hepatitis C blood test.  Hepatitis B blood test.  Sexually transmitted disease (STD) testing.  Diabetes screening. This is done by checking your blood sugar (glucose) after you have not eaten for a while (fasting). You may have this done every 1-3 years.  Abdominal aortic aneurysm (AAA) screening. You may need this if you are a current or former smoker.  Osteoporosis. You may be screened starting at age 81 if you are at high risk. Talk with your health care provider about your test results, treatment options, and if necessary, the need for more tests. Vaccines  Your health care provider may recommend certain vaccines, such as:  Influenza vaccine. This is recommended every year.  Tetanus, diphtheria, and acellular pertussis (Tdap, Td) vaccine.  You may need a Td booster every 10 years.  Zoster vaccine. You may need this after age 81.  Pneumococcal 13-valent conjugate (PCV13) vaccine. One dose is recommended after age  81.  Pneumococcal polysaccharide (PPSV23) vaccine. One dose is recommended after age 81. Talk to your health care provider about which screenings and vaccines you need and how often you need them. This information is not intended to replace advice given to you by your health care provider. Make sure you discuss any questions you have with your health care provider. Document Released: 05/22/2015 Document Revised: 01/13/2016 Document Reviewed: 02/24/2015 Elsevier Interactive Patient Education  2017 [place] Prevention in the Home Falls can cause injuries. They can happen to people of all ages. There are many things you can do to make your home safe and to help prevent falls. What can I do on the outside of my home?  Regularly fix the edges of walkways and driveways and fix any cracks.  Remove anything that might make you trip as you walk through a door, such as a raised step or threshold.  Trim any bushes or trees on the path to your home.  Use bright outdoor lighting.  Clear any walking paths of anything that might make someone trip, such as rocks or tools.  Regularly check to see if handrails are loose or broken. Make sure that both sides of any steps have handrails.  Any raised decks and porches should have guardrails on the edges.  Have any leaves, snow, or ice cleared regularly.  Use sand or salt on walking paths during winter.  Clean up any spills in your garage right away. This includes oil or grease spills. What can I do in the bathroom?  Use night lights.  Install grab bars by the toilet and in the tub and shower. Do not use towel bars as grab bars.  Use non-skid mats or decals in the tub or shower.  If you need to sit down in the shower, use a plastic, non-slip stool.  Keep the floor dry. Clean up any water that spills on the floor as soon as it happens.  Remove soap buildup in the tub or shower regularly.  Attach bath mats securely with double-sided  non-slip rug tape.  Do not have throw rugs and other things on the floor that can make you trip. What can I do in the bedroom?  Use night lights.  Make sure that you have a light by your bed that is easy to reach.  Do not use any sheets or blankets that are too big for your bed. They should not hang down onto the floor.  Have a firm chair that has side arms. You can use this for support while you get dressed.  Do not have throw rugs and other things on the floor that can make you trip. What can I do in the kitchen?  Clean up any spills right away.  Avoid walking on wet floors.  Keep items that you use a lot in easy-to-reach places.  If you need to reach something above you, use a strong step stool that has a grab bar.  Keep electrical cords out of the way.  Do not use floor polish or wax that makes floors slippery. If you must use wax, use non-skid floor wax.  Do not have throw rugs and other things on the floor that can make you trip. What can I do with my stairs?  Do not leave any  items on the stairs.  Make sure that there are handrails on both sides of the stairs and use them. Fix handrails that are broken or loose. Make sure that handrails are as long as the stairways.  Check any carpeting to make sure that it is firmly attached to the stairs. Fix any carpet that is loose or worn.  Avoid having throw rugs at the top or bottom of the stairs. If you do have throw rugs, attach them to the floor with carpet tape.  Make sure that you have a light switch at the top of the stairs and the bottom of the stairs. If you do not have them, ask someone to add them for you. What else can I do to help prevent falls?  Wear shoes that:  Do not have high heels.  Have rubber bottoms.  Are comfortable and fit you well.  Are closed at the toe. Do not wear sandals.  If you use a stepladder:  Make sure that it is fully opened. Do not climb a closed stepladder.  Make sure that both  sides of the stepladder are locked into place.  Ask someone to hold it for you, if possible.  Clearly mark and make sure that you can see:  Any grab bars or handrails.  First and last steps.  Where the edge of each step is.  Use tools that help you move around (mobility aids) if they are needed. These include:  Canes.  Walkers.  Scooters.  Crutches.  Turn on the lights when you go into a dark area. Replace any light bulbs as soon as they burn out.  Set up your furniture so you have a clear path. Avoid moving your furniture around.  If any of your floors are uneven, fix them.  If there are any pets around you, be aware of where they are.  Review your medicines with your doctor. Some medicines can make you feel dizzy. This can increase your chance of falling. Ask your doctor what other things that you can do to help prevent falls. This information is not intended to replace advice given to you by your health care provider. Make sure you discuss any questions you have with your health care provider. Document Released: 02/19/2009 Document Revised: 10/01/2015 Document Reviewed: 05/30/2014 Elsevier Interactive Patient Education  2017 [employer].

## 2019-05-22 NOTE — Telephone Encounter (Signed)
Pts wife called stating that the company is telling her they did not receive this fax. She is requesting to have this re faxed. Please advise.

## 2019-05-24 NOTE — Telephone Encounter (Signed)
Patient's wife called in stating she was informed her husband needs to make an appointment for a sleep study, as per doctors request, and pt's wife would like to know why this must be done for his machine. Please advise.

## 2019-05-27 DIAGNOSIS — G4733 Obstructive sleep apnea (adult) (pediatric): Secondary | ICD-10-CM | POA: Diagnosis not present

## 2019-05-27 DIAGNOSIS — R0602 Shortness of breath: Secondary | ICD-10-CM | POA: Diagnosis not present

## 2019-05-28 DIAGNOSIS — G4733 Obstructive sleep apnea (adult) (pediatric): Secondary | ICD-10-CM | POA: Diagnosis not present

## 2019-05-28 DIAGNOSIS — R0602 Shortness of breath: Secondary | ICD-10-CM | POA: Diagnosis not present

## 2019-06-03 ENCOUNTER — Ambulatory Visit: Payer: PPO | Attending: Internal Medicine

## 2019-06-03 DIAGNOSIS — Z23 Encounter for immunization: Secondary | ICD-10-CM | POA: Insufficient documentation

## 2019-06-03 NOTE — Progress Notes (Signed)
   Covid-19 Vaccination Clinic  Name:  Chris Price    MRN: [id number] DOB: 1940-05-23  06/03/2019  [name] was observed post Covid-19 immunization for 15 minutes without incidence. He was provided with Vaccine Information Sheet and instruction to access the V-Safe system.   [name] was instructed to call 911 with any severe reactions post vaccine: Marland Kitchen Difficulty breathing  . Swelling of your face and throat  . A fast heartbeat  . A bad rash all over your body  . Dizziness and weakness    Immunizations Administered    Name Date Dose VIS Date Route   Pfizer COVID-19 Vaccine 06/03/2019  8:55 AM 0.3 mL 04/19/2019 Intramuscular   Manufacturer: [place]   Lot: [id number]   [place]: [id number]

## 2019-06-04 ENCOUNTER — Ambulatory Visit (INDEPENDENT_AMBULATORY_CARE_PROVIDER_SITE_OTHER): Payer: PPO | Admitting: *Deleted

## 2019-06-04 DIAGNOSIS — I482 Chronic atrial fibrillation, unspecified: Secondary | ICD-10-CM

## 2019-06-05 LAB — CUP PACEART REMOTE DEVICE CHECK
Battery Remaining Longevity: 112 mo
Battery Voltage: 3.01 V
Brady Statistic AP VP Percent: 0 %
Brady Statistic AP VS Percent: 0 %
Brady Statistic AS VP Percent: 2.49 %
Brady Statistic AS VS Percent: 97.51 %
Brady Statistic RA Percent Paced: 0 %
Brady Statistic RV Percent Paced: 4.71 %
Date Time Interrogation Session: 20210126012204
HighPow Impedance: 63 Ohm
Implantable Lead Implant Date: 20130502
Implantable Lead Implant Date: 20130502
Implantable Lead Location: 753859
Implantable Lead Location: 753860
Implantable Lead Model: 5076
Implantable Lead Model: 6935
Implantable Pulse Generator Implant Date: 20181031
Lead Channel Impedance Value: 380 Ohm
Lead Channel Impedance Value: 494 Ohm
Lead Channel Impedance Value: 570 Ohm
Lead Channel Pacing Threshold Amplitude: 0.625 V
Lead Channel Pacing Threshold Pulse Width: 0.4 ms
Lead Channel Sensing Intrinsic Amplitude: 7.625 mV
Lead Channel Sensing Intrinsic Amplitude: 7.625 mV
Lead Channel Setting Pacing Amplitude: 2.5 V
Lead Channel Setting Pacing Pulse Width: 0.4 ms
Lead Channel Setting Sensing Sensitivity: 0.3 mV

## 2019-06-06 ENCOUNTER — Encounter: Payer: Self-pay | Admitting: Family Medicine

## 2019-06-11 ENCOUNTER — Other Ambulatory Visit: Payer: Self-pay

## 2019-06-11 ENCOUNTER — Encounter: Payer: Self-pay | Admitting: Internal Medicine

## 2019-06-11 ENCOUNTER — Ambulatory Visit (INDEPENDENT_AMBULATORY_CARE_PROVIDER_SITE_OTHER): Payer: PPO | Admitting: Internal Medicine

## 2019-06-11 VITALS — BP 130/82 | HR 68 | Ht 66.0 in | Wt 218.0 lb

## 2019-06-11 DIAGNOSIS — I428 Other cardiomyopathies: Secondary | ICD-10-CM

## 2019-06-11 DIAGNOSIS — I5022 Chronic systolic (congestive) heart failure: Secondary | ICD-10-CM

## 2019-06-11 DIAGNOSIS — Z95810 Presence of automatic (implantable) cardiac defibrillator: Secondary | ICD-10-CM | POA: Diagnosis not present

## 2019-06-11 DIAGNOSIS — I482 Chronic atrial fibrillation, unspecified: Secondary | ICD-10-CM | POA: Diagnosis not present

## 2019-06-11 MED ORDER — BISOPROLOL FUMARATE 5 MG PO TABS: 2.5000 mg | ORAL_TABLET | Freq: Two times a day (BID) | ORAL | 1 refills | Status: DC

## 2019-06-11 NOTE — Patient Instructions (Signed)
Medication Instructions:  - Your physician has recommended you make the following change in your medication:   1) Increase bisoprolol 5 mg- take 0.5 tablet (2.5 mg) by mouth TWICE daily (about every 12 hours)  *If you need a refill on your cardiac medications before your next appointment, please call your pharmacy*  Lab Work: - none ordered  If you have labs (blood work) drawn today and your tests are completely normal, you will receive your results only by: Marland Kitchen MyChart Message (if you have MyChart) OR . A paper copy in the mail If you have any lab test that is abnormal or we need to change your treatment, we will call you to review the results.  Testing/Procedures: - none ordered  Follow-Up: At [facility], you and your health needs are our priority.  As part of our continuing mission to provide you with exceptional heart care, we have created designated Provider Care Teams.  These Care Teams include your primary Cardiologist (physician) and Advanced Practice Providers (APPs -  Physician Assistants and Nurse Practitioners) who all work together to provide you with the care you need, when you need it.  Your next appointment:   6 month(s)  The format for your next appointment:   In Person  Provider:   Virl Axe, MD  Other Instructions n/a

## 2019-06-11 NOTE — Progress Notes (Signed)
Patient Care Team: [name], MD as PCP - General (Family Medicine) [name], MD as Consulting Physician (Cardiology) Murrell Redden, MD as Consulting Physician (Urology)   HPI  Chris Price is a 79 y.o. male Seen in followup for an ICD implanted for primary prevention in nonischemic cardiomyopathy further complicated by sinus bradycardia/ brady -date of implant May 2013.  He underwent device generator replacement 10/18.    He also has a history of atrial fibrillation for which he was managed with dabigitran and amiodarone. He developed a significant intention tremor prompting the down titration and subsequent discontinuation of his amiodarone No change in his tremor  Atrial fibrillation is now permanent.    At last visit, add bisoprolol, limited by hypotension  Doing some better  HR BP from home are noted 90-95 % of BP > 100; HR much improved   DATE Test    3/15 Cath   EF 35 % Nonobstructive disease               Date Cr K Hgb  10/18 0.95 4.9    3/20 0.96 4.5 14.8      Past Medical History:  Diagnosis Date  . Atrial fibrillation ([place])   . Cardiomyopathy -nonischemic    moderate to severe EF 25-30% March 2013  . CHF (congestive heart failure) ([place])   . Chronic systolic heart failure ([place]) 08/25/2011  . Coronary artery disease 08/15/2011   Moderate nonobstructive CAD with 60% mid LAD, 60% proximal RCA and 30 % stenossis left circumflex. Cardiac catheterization in March of 2015 showed no significant change   . Degenerative disk disease    lumbar  . Disc herniation   . GERD (gastroesophageal reflux disease)   . Hyperlipidemia   . Hypertension   . ICD (implantable cardiac defibrillator) in place   . IVCD (intraventricular conduction defect)    Nonspecific QRS duration 122  . Lumbar spinal stenosis   . Neuromuscular disorder ([place])    neauropathy   / siatica  . Persistent atrial fibrillation ([place]) 11/07/2011  . Sleep apnea    uses cpap    Past Surgical  History:  Procedure Laterality Date  . CARDIAC [facility]ATION  [facility]   . CARDIAC CATHETERIZATION  07/09/2013   [facility]  . CARDIOVERSION  08/2011   [facility]  . CHOLECYSTECTOMY  April 2015  . COLONOSCOPY WITH PROPOFOL N/A 07/30/2015   Procedure: COLONOSCOPY WITH PROPOFOL;  Surgeon: Lollie Sails, MD;  Location: [facility] ENDOSCOPY;  Service: Endoscopy;  Laterality: N/A;  . cooled thermotherapy  [id number]   [place], complicated by incontinence  . HERNIA REPAIR    . ICD  09/08/2011  . ICD GENERATOR CHANGEOUT N/A 03/08/2017   Procedure: ICD GENERATOR CHANGEOUT;  Surgeon: [name], MD;  Location: [place] CV LAB;  Service: Cardiovascular;  Laterality: N/A;  . IMPLANTABLE CARDIOVERTER DEFIBRILLATOR IMPLANT  09/08/2011   Procedure: IMPLANTABLE CARDIOVERTER DEFIBRILLATOR IMPLANT;  Surgeon: [name], MD;  Location: [facility] CATH LAB;  Service: Cardiovascular;;  . LUMBAR LAMINECTOMY     L2 through S1 with wide decompression of the thecal sac and nerve roots.  Marland Kitchen PROSTATE BIOPSY  2013    Current Outpatient Medications  Medication Sig Dispense Refill  . bisoprolol (ZEBETA) 5 MG tablet TAKE 1/2 TABLET BY MOUTH DAILY 45 tablet 0  . ezetimibe (ZETIA) 10 MG tablet Take 1 tablet (10 mg total) by mouth daily. 90 tablet 1  . finasteride (PROSCAR) 5 MG tablet Take 1  tablet (5 mg total) by mouth daily. 90 tablet 3  . furosemide (LASIX) 20 MG tablet Take 2 tablets (40 mg) by mouth once every other day as directed 90 tablet 3  . lisinopril (ZESTRIL) 2.5 MG tablet Take 1 tablet (2.5 mg total) by mouth daily. 90 tablet 0  . multivitamin (ONE-A-DAY MEN'S) TABS tablet Take 1 tablet by mouth daily.    . nitroGLYCERIN (NITROSTAT) 0.4 MG SL tablet Place 1 tablet (0.4 mg total) under the tongue every 5 (five) minutes as needed for chest pain. 25 tablet 3  . omeprazole (PRILOSEC) 20 MG capsule TAKE 1 CAPSULE 2 TIMES DAILY BEFORE A MEAL. FOR ACID REFLUX. (Patient taking differently: Take 20 mg by mouth 2 (two) times  daily as needed (heartburn). ) 60 capsule 5  . rosuvastatin (CRESTOR) 40 MG tablet TAKE ONE TABLET BY MOUTH DAILY 90 tablet 0  . traZODone (DESYREL) 100 MG tablet Take 1 tablet (100 mg total) by mouth at bedtime. 90 tablet 1  . vitamin C (ASCORBIC ACID) 500 MG tablet Take 500 mg by mouth daily.    Alveda Reasons 20 MG TABS tablet TAKE ONE TABLET BY MOUTH DAILY WITH SUPPER 90 tablet 1   No current facility-administered medications for this visit.    Allergies  Allergen Reactions  . Coreg [Carvedilol] Other (See Comments)    hypotension  . Flomax [Tamsulosin Hcl] Other (See Comments)    Hypotension    Review of Systems negative except from HPI and PMH  Physical Exam BP 130/82 (BP Location: Left Arm, Patient Position: Sitting, Cuff Size: Normal)   Pulse 68   Ht 5\' 6"  (1.676 m)   Wt 218 lb (98.9 kg)   BMI 35.19 kg/m  Well developed and nourished in no acute distress HENT normal Neck supple with JVP-flat Carotids brisk and full without bruits Clear Irregularly irregular rate and rhythm with controlled ventricular response, no murmurs or gallops Abd-soft with active BS without hepatomegaly No Clubbing cyanosis edema Skin-warm and dry A & Oriented  Grossly normal sensory and motor function  ECG: AF   @68             Intervals  -/12/39  Axis -74 Rare PVC     Assessment and  Plan  NICM  Afib-permanent  HTN  IVCD/LBBB  Tremor  ICD-MDT The patient's device was interrogated.  The information was reviewed. No changes were made in the programming.   .    BP well controlled, limiting, but will increase bisoprolol to augment rate control 2.5 qd >> bid  On Anticoagulation;  No bleeding issues   Rivaroxaban dose appropriate for GFR > 80  Euvolemic continue current meds  Neuro has been interested in a medicine for his tremor-- have reached out to them

## 2019-06-24 ENCOUNTER — Other Ambulatory Visit: Payer: Self-pay | Admitting: Internal Medicine

## 2019-06-24 ENCOUNTER — Ambulatory Visit: Payer: PPO | Attending: Internal Medicine

## 2019-06-24 DIAGNOSIS — I428 Other cardiomyopathies: Secondary | ICD-10-CM

## 2019-06-24 DIAGNOSIS — Z23 Encounter for immunization: Secondary | ICD-10-CM

## 2019-06-24 NOTE — Progress Notes (Signed)
   Covid-19 Vaccination Clinic  Name:  Chris Price    MRN: [id number] DOB: [birth year]  06/24/2019  [name] was observed post Covid-19 immunization for 15 minutes without incidence. He was provided with Vaccine Information Sheet and instruction to access the V-Safe system.   [name] was instructed to call 911 with any severe reactions post vaccine: Marland Kitchen Difficulty breathing  . Swelling of your face and throat  . A fast heartbeat  . A bad rash all over your body  . Dizziness and weakness    Immunizations Administered    Name Date Dose VIS Date Route   Pfizer COVID-19 Vaccine 06/24/2019  9:09 AM 0.3 mL 04/19/2019 Intramuscular   Manufacturer: [place]   Lot: [id number]   [place]: [id number]

## 2019-06-25 NOTE — Telephone Encounter (Signed)
Please review Xarelto Refill Thanks !

## 2019-06-25 NOTE — Telephone Encounter (Signed)
Prescription refill request for Xarelto received.   Last office visit: Caryl Comes, 06/11/2019 Weight: 98.9 kg  Age: 79 y.o. Scr: 0.96, 07/17/2018 CrCl: 86 ml/min   Prescription refill sent.

## 2019-07-04 DIAGNOSIS — G4733 Obstructive sleep apnea (adult) (pediatric): Secondary | ICD-10-CM | POA: Diagnosis not present

## 2019-07-25 ENCOUNTER — Ambulatory Visit: Payer: PPO | Admitting: Family Medicine

## 2019-07-31 ENCOUNTER — Ambulatory Visit
Admission: RE | Admit: 2019-07-31 | Discharge: 2019-07-31 | Disposition: A | Discharge status: Home / Self Care | Payer: PPO | Source: Ambulatory Visit | Attending: Family Medicine | Admitting: Family Medicine

## 2019-07-31 ENCOUNTER — Encounter: Payer: Self-pay | Admitting: Family Medicine

## 2019-07-31 ENCOUNTER — Other Ambulatory Visit: Payer: Self-pay

## 2019-07-31 ENCOUNTER — Ambulatory Visit (INDEPENDENT_AMBULATORY_CARE_PROVIDER_SITE_OTHER): Payer: PPO | Admitting: Family Medicine

## 2019-07-31 VITALS — BP 110/80 | HR 78 | Temp 96.8°F | Resp 16 | Ht 66.0 in | Wt 215.8 lb

## 2019-07-31 DIAGNOSIS — I482 Chronic atrial fibrillation, unspecified: Secondary | ICD-10-CM

## 2019-07-31 DIAGNOSIS — I5022 Chronic systolic (congestive) heart failure: Secondary | ICD-10-CM | POA: Diagnosis not present

## 2019-07-31 DIAGNOSIS — G4733 Obstructive sleep apnea (adult) (pediatric): Secondary | ICD-10-CM | POA: Diagnosis not present

## 2019-07-31 DIAGNOSIS — F5101 Primary insomnia: Secondary | ICD-10-CM

## 2019-07-31 DIAGNOSIS — G250 Essential tremor: Secondary | ICD-10-CM | POA: Diagnosis not present

## 2019-07-31 DIAGNOSIS — E785 Hyperlipidemia, unspecified: Secondary | ICD-10-CM | POA: Diagnosis not present

## 2019-07-31 DIAGNOSIS — G8929 Other chronic pain: Secondary | ICD-10-CM

## 2019-07-31 DIAGNOSIS — I10 Essential (primary) hypertension: Secondary | ICD-10-CM | POA: Diagnosis not present

## 2019-07-31 DIAGNOSIS — I428 Other cardiomyopathies: Secondary | ICD-10-CM

## 2019-07-31 DIAGNOSIS — R05 Cough: Secondary | ICD-10-CM

## 2019-07-31 DIAGNOSIS — M545 Low back pain, unspecified: Secondary | ICD-10-CM

## 2019-07-31 DIAGNOSIS — R739 Hyperglycemia, unspecified: Secondary | ICD-10-CM | POA: Diagnosis not present

## 2019-07-31 DIAGNOSIS — Z9989 Dependence on other enabling machines and devices: Secondary | ICD-10-CM

## 2019-07-31 DIAGNOSIS — R7303 Prediabetes: Secondary | ICD-10-CM

## 2019-07-31 DIAGNOSIS — R058 Other specified cough: Secondary | ICD-10-CM

## 2019-07-31 MED ORDER — LISINOPRIL 2.5 MG PO TABS: 2.5000 mg | ORAL_TABLET | Freq: Every day | ORAL | 1 refills | Status: DC

## 2019-07-31 MED ORDER — TRAZODONE HCL 100 MG PO TABS: 100.0000 mg | ORAL_TABLET | Freq: Every day | ORAL | 1 refills | Status: DC

## 2019-07-31 MED ORDER — ROSUVASTATIN CALCIUM 40 MG PO TABS: 40.0000 mg | ORAL_TABLET | Freq: Every day | ORAL | 1 refills | Status: DC

## 2019-07-31 MED ORDER — EZETIMIBE 10 MG PO TABS: 10.0000 mg | ORAL_TABLET | Freq: Every day | ORAL | 1 refills | Status: DC

## 2019-07-31 MED ORDER — FLUTICASONE PROPIONATE 50 MCG/ACT NA SUSP: 2.0000 | Freq: Every day | NASAL | 2 refills | Status: DC

## 2019-07-31 NOTE — Addendum Note (Signed)
Addended by: [name] F on: 07/31/2019 11:24 AM   Modules accepted: Orders

## 2019-07-31 NOTE — Progress Notes (Signed)
Name: Chris Price   MRN: [id number]    DOB: 1940-06-24   Date:07/31/2019       Progress Note  Subjective  Chief Complaint  Chief Complaint  Patient presents with  . Dyslipidemia  . Hypotension  . Atrial Fibrillation  . Hyperglycemia  . Tremors    Continues to have tremors in hands.  . Nasal Congestion    He has a lot of congestion in his chest.  . Hip Pain    Continues to have left hip pain that is gradually worsening. He would like to be referred to specialist.    HPI    Chronic afib: he sees Dr. Jens Som, no palpitation or SOB, he has a pacemaker, he is on beta-blocker and back on lisinopril   Productive cough: he never smoked, second hand smoking as a child - both parents smoked - He has noticed some chest congestion and spits up white phlegm over the past couple of months, not associated with increase of SOB, no lower extremity edema. He denies wheezing. No fever or chills, denies having an URI before symptoms started. No history of asthma  Lower back pain with left outer hip pain: : he states pain on left side is worse than right, symptoms started about one year ago, aggravated by walking, pain seems to be from left lower back to left outer hip, he denies bowel or bladder incontinence.   CHF : he is doing well, he denies SOB  chest pain , orthopnea or lower extremity edema, he sees cardiologist,he is off spironolactone back on beta-blocker, and also on low dose of lisinopril and is doing well at this time   OSA/CPAP: he is very compliant with CPAP, wears even on vacation, denies headache in am. He has a new machine and is doing well   Pre-diabetes: last hgbA1C was slightly up, he likes ice cream,  denies polyphagia, polydipsia or polyuria   HTN:bp at home has been towards low end of norma 100's/60's, but no longer having dizziness, denies chest pain or palpitation   Paresthesia both feet: going on since 2007 after back surgery - Dr. Rennis Harding, but getting  progressively worse.Last b12 level was normal, discussed gabapentin but he does not want it at this time, since no pain, just numbness. He forgot to talk to neurologist about the feet, he states he cannot feel his feet, reminded him to have closed toe shoes all the time. He only wears sandals at the beach   Insomnia:he is currently taking Trazodone 100 mg and melatonin  and is working well for him, he is wearing his CPAP and is sleeping all night for at least 7 hours   Tremors hands: going on for years, but worse since 2019 , [facility] , first visit was virtual second visit with PA and NCS was not done, he would like a second opinion. 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 Hearing loss, sensorineural 09/12/2017  . Prediabetes 07/01/2017  . Hx of colonic polyps 06/19/2016  . Long-term use of high-risk medication 06/19/2016  . Tubular adenoma of colon 09/07/2015  . Low back pain due to displacement of intervertebral disc 04/09/2015  . Diarrhea following gastrointestinal surgery 09/04/2013  . S/P laparoscopic cholecystectomy 08/20/2013  . Chronic atrial fibrillation ([place]) 07/15/2013  . Morbid obesity ([place]) 02/28/2013  .  OSA on CPAP 01/03/2013  . Hyperlipidemia with target low density lipoprotein (LDL) cholesterol less than 70 mg/dL 03/22/2012  . ED (erectile dysfunction) of organic origin 01/23/2012  . Benign localized prostatic hyperplasia with lower urinary tract symptoms (LUTS) 01/23/2012  . Bladder outlet obstruction 12/25/2011  . Essential tremor 12/22/2011  . Hypertension   . Hearing loss in right ear 09/20/2011  . Elevated PSA, less than 10 ng/ml 09/20/2011  . Automatic implantable cardioverter-defibrillator Medtronic dual-chamber 09/09/2011  . Chronic systolic heart failure ([place]) 08/25/2011  . IVCD (intraventricular  conduction defect)   . Coronary artery disease 08/15/2011    Past Surgical History:  Procedure Laterality Date  . CARDIAC [facility]  [facility]   . CARDIAC CATHETERIZATION  07/09/2013   [facility]  . CARDIOVERSION  08/2011   [facility]  . CHOLECYSTECTOMY  April 2015  . COLONOSCOPY WITH PROPOFOL N/A 07/30/2015   Procedure: COLONOSCOPY WITH PROPOFOL;  Surgeon: Lollie Sails, MD;  Location: [facility] ENDOSCOPY;  Service: Endoscopy;  Laterality: N/A;  . cooled thermotherapy  [id number]   [place], complicated by incontinence  . HERNIA REPAIR    . ICD  09/08/2011  . ICD GENERATOR CHANGEOUT N/A 03/08/2017   Procedure: ICD GENERATOR CHANGEOUT;  Surgeon: [name], MD;  Location: [place] CV LAB;  Service: Cardiovascular;  Laterality: N/A;  . IMPLANTABLE CARDIOVERTER DEFIBRILLATOR IMPLANT  09/08/2011   Procedure: IMPLANTABLE CARDIOVERTER DEFIBRILLATOR IMPLANT;  Surgeon: [name], MD;  Location: [facility] CATH LAB;  Service: Cardiovascular;;  . LUMBAR LAMINECTOMY     L2 through S1 with wide decompression of the thecal sac and nerve roots.  Marland Kitchen PROSTATE BIOPSY  2013    Family History  Problem Relation Age of Onset  . Heart attack Father   . Heart attack Brother   . Heart attack Son   . Ovarian cancer Daughter     Social History   Tobacco Use  . Smoking status: Never Smoker  . Smokeless tobacco: Never Used  Substance Use Topics  . Alcohol use: No     Current Outpatient Medications:  .  bisoprolol (ZEBETA) 5 MG tablet, Take 0.5 tablets (2.5 mg total) by mouth 2 (two) times daily., Disp: 90 tablet, Rfl: 1 .  ezetimibe (ZETIA) 10 MG tablet, Take 1 tablet (10 mg total) by mouth daily., Disp: 90 tablet, Rfl: 1 .  finasteride (PROSCAR) 5 MG tablet, Take 1 tablet (5 mg total) by mouth daily., Disp: 90 tablet, Rfl: 3 .  furosemide (LASIX) 20 MG tablet, TAKE ONE TABLET BY MOUTH DAILY, Disp: 90 tablet, Rfl: 2 .  lisinopril (ZESTRIL) 2.5 MG tablet, Take 1 tablet (2.5 mg total) by mouth daily., Disp:  90 tablet, Rfl: 0 .  multivitamin (ONE-A-DAY MEN'S) TABS tablet, Take 1 tablet by mouth daily., Disp: , Rfl:  .  nitroGLYCERIN (NITROSTAT) 0.4 MG SL tablet, Place 1 tablet (0.4 mg total) under the tongue every 5 (five) minutes as needed for chest pain., Disp: 25 tablet, Rfl: 3 .  omeprazole (PRILOSEC) 20 MG capsule, TAKE 1 CAPSULE 2 TIMES DAILY BEFORE A MEAL. FOR ACID REFLUX. (Patient taking differently: Take 20 mg by mouth 2 (two) times daily as needed (heartburn). ), Disp: 60 capsule, Rfl: 5 .  rosuvastatin (CRESTOR) 40 MG tablet, TAKE ONE TABLET BY MOUTH DAILY, Disp: 90 tablet, Rfl: 0 .  traZODone (DESYREL) 100 MG tablet, Take 1 tablet (100 mg total) by mouth at bedtime., Disp: 90 tablet, Rfl: 1 .  vitamin C (ASCORBIC ACID) 500 MG  tablet, Take 500 mg by mouth daily., Disp: , Rfl:  .  XARELTO 20 MG TABS tablet, TAKE ONE TABLET BY MOUTH DAILY WITH SUPPER, Disp: 90 tablet, Rfl: 1  Allergies  Allergen Reactions  . Coreg [Carvedilol] Other (See Comments)    hypotension  . Flomax [Tamsulosin Hcl] Other (See Comments)    Hypotension    I personally reviewed active problem list, medication list, allergies, family history with the patient/caregiver today.   ROS  Constitutional: Negative for fever or weight change.  Respiratory: positive for cough and mild shortness of breath with activity    Cardiovascular: Negative for chest pain or palpitations.  Gastrointestinal: Negative for abdominal pain, no bowel changes.  Musculoskeletal: positive  for gait problem but no  joint swelling.  Skin: Negative for rash.  Neurological: Negative for dizziness or headache.  No other specific complaints in a complete review of systems (except as listed in HPI above).  Objective  Vitals:   07/31/19 1030  BP: 110/80  Pulse: 78  Resp: 16  Temp: (!) 96.8 F (36 C)  TempSrc: Temporal  SpO2: 94%  Weight: 215 lb 12.8 oz (97.9 kg)  Height: 5\' 6"  (1.676 m)    Body mass index is 34.83 kg/m.  Physical  Exam  Constitutional: Patient appears well-developed and well-nourished. Obese  No distress.  HEENT: head atraumatic, normocephalic, pupils equal and reactive to light Cardiovascular: Normal rate, regular rhythm and normal heart sounds.  No murmur heard. No BLE edema. Pulmonary/Chest: Effort normal and breath sounds normal. No respiratory distress. Abdominal: Soft.  There is no tenderness. Neurology: tremors on both hands constantly worse with activity  Muscular skeletal: pain during palpation of left outer hip, pain during palpation of left lower back , normal rom of both hips  Psychiatric: Patient has a normal mood and affect. behavior is normal. Judgment and thought content normal.  Recent Results (from the past 2160 hour(s))  CUP PACEART REMOTE DEVICE CHECK     Status: None   Collection Time: 06/04/19  1:22 AM  Result Value Ref Range   Date Time Interrogation Session [phone]    Pulse Generator Manufacturer MERM    Pulse Gen Model [id number] Evera MRI XT DR    Pulse Gen Serial Number [id number]    Clinic Name [facility]    Implantable Pulse Generator Type Implantable Cardiac Defibulator    Implantable Pulse Generator Implant Date [id number]    Implantable Lead Manufacturer MERM    Implantable Lead Model 5076 CapSureFix Novus    Implantable Lead Serial Number [id number]    Implantable Lead Implant Date [id number]    Implantable Lead Location Detail 1 APPENDAGE    Implantable Lead Location [id number]    Implantable Lead Manufacturer [facility]    Implantable Lead Model 6935 Sprint Quattro Secure S    Implantable Lead Serial Number [id number]    Implantable Lead Implant Date [id number]    Implantable Lead Location Detail 1 APEX    Implantable Lead Location [id number]    Lead Channel Setting Sensing Sensitivity 0.3 mV   Lead Channel Setting Pacing Pulse Width 0.4 ms   Lead Channel Setting Pacing Amplitude 2.5 V   Lead Channel Impedance Value 494 ohm   Lead Channel Impedance Value 570 ohm    Lead Channel Impedance Value 380 ohm   Lead Channel Sensing Intrinsic Amplitude 7.625 mV   Lead Channel Sensing Intrinsic Amplitude 7.625 mV   Lead Channel Pacing Threshold Amplitude 0.625 V   Lead Channel Pacing Threshold Pulse Width  0.4 ms   HighPow Impedance 63 ohm   Battery Status OK    Battery Remaining Longevity 112 mo   Battery Voltage 3.01 V   Brady Statistic RA Percent Paced 0 %   Brady Statistic RV Percent Paced 4.71 %   Brady Statistic AP VP Percent 0 %   Brady Statistic AS VP Percent 2.49 %   [employer] AP VS Percent 0 %   Brady Statistic AS VS Percent 97.51 %    PHQ2/9: Depression screen [facility] 2/9 07/31/2019 05/21/2019 01/25/2019 07/17/2018 05/15/2018  Decreased Interest 0 0 0 0 0  Down, Depressed, Hopeless 0 0 0 0 0  PHQ - 2 Score 0 0 0 0 0  Altered sleeping 0 - 0 0 -  Tired, decreased energy 0 - 0 0 -  Change in appetite 0 - 0 0 -  Feeling bad or failure about yourself  0 - 0 0 -  Trouble concentrating 0 - 0 0 -  Moving slowly or fidgety/restless 0 - 0 0 -  Suicidal thoughts 0 - 0 0 -  PHQ-9 Score 0 - 0 0 -  Difficult doing work/chores - - - Not difficult at all -    phq 9 is negative   Fall Risk: Fall Risk  07/31/2019 05/21/2019 01/25/2019 07/17/2018 05/15/2018  Falls in the past year? 0 0 1 0 0  Number falls in past yr: 0 0 1 0 0  Injury with Fall? 0 0 1 0 -  Follow up - Falls prevention discussed - - -     Functional Status Survey: Is the patient deaf or have difficulty hearing?: No Does the patient have difficulty seeing, even when wearing glasses/contacts?: No Does the patient have difficulty concentrating, remembering, or making decisions?: No Does the patient have difficulty walking or climbing stairs?: No Does the patient have difficulty dressing or bathing?: No Does the patient have difficulty doing errands alone such as visiting a doctor's office or shopping?: No   Assessment & Plan  1. OSA on CPAP  Wearing CPAP every night, not sure of the  pressure, rx by Dr. Caryl Comes   2. Chronic systolic heart failure (HCC)  - lisinopril (ZESTRIL) 2.5 MG tablet; Take 1 tablet (2.5 mg total) by mouth daily.  Dispense: 90 tablet; Refill: 1  3. Dyslipidemia  - ezetimibe (ZETIA) 10 MG tablet; Take 1 tablet (10 mg total) by mouth daily.  Dispense: 90 tablet; Refill: 1 - rosuvastatin (CRESTOR) 40 MG tablet; Take 1 tablet (40 mg total) by mouth daily.  Dispense: 90 tablet; Refill: 1  4. Essential tremor  - Ambulatory referral to Neurology  5. Chronic atrial fibrillation (HCC)  Rate controlled   6. Prediabetes  Recheck A1C   7. Essential hypertension  - CBC with Differential/Platelet - COMPLETE METABOLIC PANEL WITH GFR  8. Hyperlipidemia with target low density lipoprotein (LDL) cholesterol less than 70 mg/dL  - Lipid panel  9. Hyperglycemia  - Hemoglobin A1c  10. Other cardiomyopathy ([place])   11. Chronic left-sided low back pain without sciatica  - Ambulatory referral to Orthopedic Surgery  12. Primary insomnia  - traZODone (DESYREL) 100 MG tablet; Take 1 tablet (100 mg total) by mouth at bedtime.  Dispense: 90 tablet; Refill: 1  13. Productive cough  - DG Chest 2 View; Future if normal we will order CT chest and if normal we will start him on a inhaler

## 2019-08-01 DIAGNOSIS — G4733 Obstructive sleep apnea (adult) (pediatric): Secondary | ICD-10-CM | POA: Diagnosis not present

## 2019-08-01 LAB — LIPID PANEL
Cholesterol: 143 mg/dL (ref <200)
HDL: 44 mg/dL (ref >=40)
LDL Cholesterol (Calc): 80 mg/dL (calc)
Non-HDL Cholesterol (Calc): 99 mg/dL (calc) (ref <130)
Total CHOL/HDL Ratio: 3.3 (calc) (ref <5.0)
Triglycerides: 106 mg/dL (ref <150)

## 2019-08-01 LAB — CBC WITH DIFFERENTIAL/PLATELET
Absolute Monocytes: 561 cells/uL (ref 200–950)
Basophils Absolute: 39 cells/uL (ref 0–200)
Basophils Relative: 0.7 %
Eosinophils Absolute: 88 cells/uL (ref 15–500)
Eosinophils Relative: 1.6 %
HCT: 45 % (ref 38.5–50.0)
Hemoglobin: 14.9 g/dL (ref 13.2–17.1)
Lymphs Abs: 1111 cells/uL (ref 850–3900)
MCH: 32.2 pg (ref 27.0–33.0)
MCHC: 33.1 g/dL (ref 32.0–36.0)
MCV: 97.2 fL (ref 80.0–100.0)
MPV: 9.9 fL (ref 7.5–12.5)
Monocytes Relative: 10.2 %
Neutro Abs: 3702 cells/uL (ref 1500–7800)
Neutrophils Relative %: 67.3 %
Platelets: 184 10*3/uL (ref 140–400)
RBC: 4.63 10*6/uL (ref 4.20–5.80)
RDW: 13.2 % (ref 11.0–15.0)
Total Lymphocyte: 20.2 %
WBC: 5.5 10*3/uL (ref 3.8–10.8)

## 2019-08-01 LAB — COMPLETE METABOLIC PANEL WITH GFR
AG Ratio: 1.5 (calc) (ref 1.0–2.5)
ALT: 37 U/L (ref 9–46)
AST: 37 U/L — ABNORMAL HIGH (ref 10–35)
Albumin: 4.1 g/dL (ref 3.6–5.1)
Alkaline phosphatase (APISO): 42 U/L (ref 35–144)
BUN: 20 mg/dL (ref 7–25)
CO2: 28 mmol/L (ref 20–32)
Calcium: 9.1 mg/dL (ref 8.6–10.3)
Chloride: 104 mmol/L (ref 98–110)
Creat: 1.03 mg/dL (ref 0.70–1.11)
GFR, Est African American: 79 mL/min/{1.73_m2} (ref >=60)
GFR, Est Non African American: 68 mL/min/{1.73_m2} (ref >=60)
Globulin: 2.7 g/dL (calc) (ref 1.9–3.7)
Glucose, Bld: 93 mg/dL (ref 65–99)
Potassium: 4.3 mmol/L (ref 3.5–5.3)
Sodium: 141 mmol/L (ref 135–146)
Total Bilirubin: 1.1 mg/dL (ref 0.2–1.2)
Total Protein: 6.8 g/dL (ref 6.1–8.1)

## 2019-08-01 LAB — HEMOGLOBIN A1C
Hgb A1c MFr Bld: 5.7 % of total Hgb — ABNORMAL HIGH (ref <5.7)
Mean Plasma Glucose: 117 (calc)
eAG (mmol/L): 6.5 (calc)

## 2019-08-13 ENCOUNTER — Other Ambulatory Visit: Payer: Self-pay

## 2019-08-13 ENCOUNTER — Encounter: Payer: Self-pay | Admitting: Neurology

## 2019-08-13 ENCOUNTER — Ambulatory Visit: Payer: PPO | Admitting: Neurology

## 2019-08-13 VITALS — BP 149/96 | HR 66 | Ht 66.0 in | Wt 219.0 lb

## 2019-08-13 DIAGNOSIS — G250 Essential tremor: Secondary | ICD-10-CM

## 2019-08-13 DIAGNOSIS — M25552 Pain in left hip: Secondary | ICD-10-CM | POA: Diagnosis not present

## 2019-08-13 DIAGNOSIS — M7062 Trochanteric bursitis, left hip: Secondary | ICD-10-CM | POA: Diagnosis not present

## 2019-08-13 NOTE — Progress Notes (Signed)
Subjective:    Patient ID: Chris Price is a 79 y.o. male.  HPI     Star Age, MD, PhD [facility] [address] [address] [place], [place] [address]  Dear Dr. [name],   I saw your patient, Chris Price, upon your kind request, in my Neurologic clinic today for second opinion of his hand tremors.  The patient is accompanied by his wife today.  As you know, [name] is an 79 year old right-handed gentleman with an underlying complex medical history of hypertension, hyperlipidemia, chronic A. fib, status post ICD placement, coronary artery disease, congestive heart failure, nonischemic cardiomyopathy, lumbar spinal stenosis, obstructive sleep apnea on PAP therapy, degenerative disc disease, L hip pain and obesity, who reports a longstanding history of bilateral upper extremity tremors, likely over 10 years.  The tremor has been progressive and has been more bothersome in the recent past.  His wife has also noted that his head trembles especially when he concentrates or when he drives.  He has no tremor while sleeping.  He has no family history of tremors, no family history of Parkinson's disease.  He reports that his mother had a nervous breakdown several times, she was hospitalized in a behavioral health facility as he recalls, he was 41 or 79 years old at the time.  He was seen by [facility] clinic in neurology.  I reviewed the office note from July 2020.  Symptomatic treatment with a beta-blocker was discussed at this time as well as Mysoline. He had previously been on a beta-blocker but it was discontinued due to low blood pressure values. He is now on bisoprolol.  He is also on Lasix to 20 mg strength 2 pills every other day.  He takes Xarelto daily.  He had trouble maintaining sleep and has been on trazodone 100 mg each night.  He does not drink any alcohol, drinks decaf coffee and diet and decaf soda, several servings per day, tries to hydrate well  with water, he is physically very active.  He lives with his wife.  He has not fallen.  He has had more left hip pain and is scheduled to see an orthopedic specialist.  His Past Medical History Is Significant For: Past Medical History:  Diagnosis Date  . Atrial fibrillation ([place])   . Cardiomyopathy -nonischemic    moderate to severe EF 25-30% March 2013  . CHF (congestive heart failure) ([place])   . Chronic systolic heart failure ([place]) 08/25/2011  . Coronary artery disease 08/15/2011   Moderate nonobstructive CAD with 60% mid LAD, 60% proximal RCA and 30 % stenossis left circumflex. Cardiac catheterization in March of 2015 showed no significant change   . Degenerative disk disease    lumbar  . Disc herniation   . GERD (gastroesophageal reflux disease)   . Hyperlipidemia   . Hypertension   . ICD (implantable cardiac defibrillator) in place   . IVCD (intraventricular conduction defect)    Nonspecific QRS duration 122  . Lumbar spinal stenosis   . Neuromuscular disorder ([place])    neauropathy   / siatica  . Persistent atrial fibrillation ([place]) 11/07/2011  . Sleep apnea    uses cpap    His Past Surgical History Is Significant For: Past Surgical History:  Procedure Laterality Date  . C[facility]   . CARDIAC CATHETERIZATION  07/09/2013   [facility]  . CARD[facility]ERSION  08/2011   [facility]  . CHOLECYSTECTOMY  April 2015  . COLONOSCOPY  WITH PROPOFOL N/A 07/30/2015   Procedure: COLONOSCOPY WITH PROPOFOL;  Surgeon: Lollie Sails, MD;  Location: [facility] ENDOSCOPY;  Service: Endoscopy;  Laterality: N/A;  . cooled thermotherapy  [id number]   [place], complicated by incontinence  . HERNIA REPAIR    . ICD  09/08/2011  . ICD GENERATOR CHANGEOUT N/A 03/08/2017   Procedure: ICD GENERATOR CHANGEOUT;  Surgeon: [name], MD;  Location: [facility] CV LAB;  Service: Cardiovascular;  Laterality: N/A;  . IMPLANTABLE CARDIOVERTER DEFIBRILLATOR IMPLANT  09/08/2011   Procedure: IMPLANTABLE  CARDIOVERTER DEFIBRILLATOR IMPLANT;  Surgeon: [name], MD;  Location: [facility] CATH LAB;  Service: Cardiovascular;;  . LUMBAR LAMINECTOMY     L2 through S1 with wide decompression of the thecal sac and nerve roots.  Marland Kitchen PROSTATE BIOPSY  2013    His Family History Is Significant For: Family History  Problem Relation Age of Onset  . Heart attack Father   . Heart attack Brother   . Heart attack Son   . Ovarian cancer Daughter     His Social History Is Significant For: Social History   Socioeconomic History  . Marital status: Married    Spouse name: Not on file  . Number of children: 3  . Years of education: Not on file  . Highest education level: 12th grade  Occupational History  . 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 Smoking status: Never Smoker  . Smokeless tobacco: Never Used  Substance and Sexual Activity  . Alcohol use: No  . Drug use: No  . Sexual activity: Not Currently  Other Topics Concern  . Not on file  Social History Narrative   Second marriage, together since 7   He has 3 children from previous marriage and one step-daughter    Social Determinants of Health   Financial Resource Strain: Low Risk   . Difficulty of Paying Living Expenses: Not hard at all  Food Insecurity: No Food Insecurity  . Worried About Charity fundraiser in the Last Year: Never true  . Ran Out of Food in the Last Year: Never true  Transportation Needs: No Transportation Needs  . Lack of Transportation (Medical): No  . Lack of Transportation (Non-Medical): No  Physical Activity: Inactive  . Days of Exercise per Week: 0 days  . Minutes of Exercise per Session: 0 min  Stress: No Stress Concern Present  . Feeling of Stress : Only a little  Social Connections: Somewhat Isolated  . Frequency of Communication with Friends and Family: More than three times a week  . Frequency of Social Gatherings with Friends and Family: Three times a week  . Attends  Religious Services: Never  . Active Member of Clubs or Organizations: No  . Attends Archivist Meetings: Never  . Marital Status: Married    His Allergies Are:  Allergies  Allergen Reactions  . Coreg [Carvedilol] Other (See Comments)    hypotension  . Flomax [Tamsulosin Hcl] Other (See Comments)    Hypotension  :   His Current Medications Are:  Outpatient Encounter Medications as of 08/13/2019  Medication Sig  . bisoprolol (ZEBETA) 5 MG tablet Take 0.5 tablets (2.5 mg total) by mouth 2 (two) times daily.  Marland Kitchen ezetimibe (ZETIA) 10 MG tablet Take 1 tablet (10 mg total) by mouth daily.  . finasteride (PROSCAR) 5 MG tablet Take 1 tablet (5 mg total) by mouth daily.  . fluticasone (FLONASE) 50 MCG/ACT nasal spray  Place 2 sprays into both nostrils daily.  . furosemide (LASIX) 20 MG tablet TAKE ONE TABLET BY MOUTH DAILY  . lisinopril (ZESTRIL) 2.5 MG tablet Take 1 tablet (2.5 mg total) by mouth daily.  . multivitamin (ONE-A-DAY MEN'S) TABS tablet Take 1 tablet by mouth daily.  . nitroGLYCERIN (NITROSTAT) 0.4 MG SL tablet Place 1 tablet (0.4 mg total) under the tongue every 5 (five) minutes as needed for chest pain.  Marland Kitchen omeprazole (PRILOSEC) 20 MG capsule TAKE 1 CAPSULE 2 TIMES DAILY BEFORE A MEAL. FOR ACID REFLUX. (Patient taking differently: Take 20 mg by mouth 2 (two) times daily as needed (heartburn). )  . rosuvastatin (CRESTOR) 40 MG tablet Take 1 tablet (40 mg total) by mouth daily.  . traZODone (DESYREL) 100 MG tablet Take 1 tablet (100 mg total) by mouth at bedtime.  . vitamin C (ASCORBIC ACID) 500 MG tablet Take 500 mg by mouth daily.  Alveda Reasons 20 MG TABS tablet TAKE ONE TABLET BY MOUTH DAILY WITH SUPPER   No facility-administered encounter medications on file as of 08/13/2019.  :   Review of Systems:  Out of a complete 14 point review of systems, all are reviewed and negative with the exception of these symptoms as listed below:         Review of Systems   Neurological:       Pt presents today to discuss his bilateral hand tremors. Pt is right handed. Pt first saw a neurologist at [facility] but wants a second opinion.    Objective:  Neurological Exam  Physical Exam Physical Examination:   Vitals:   08/13/19 1253  BP: (!) 149/96  Pulse: 66   General Examination: The patient is a very pleasant 79 y.o. male in no acute distress. He appears well-developed and well-nourished and well groomed.   HEENT: Normocephalic, atraumatic, pupils are equal, round and reactive to light and accommodation. R eye exotropia noted, not new.  Extraocular tracking is well preserved with the left eye, difficulty with the right eye.  No obvious facial masking is noted, he has prescription eyeglasses but reports that these are old and he has not been to the eye doctor in 2 years.  He is planning to see his eye specialist.  Hearing is perhaps mildly impaired.airway examination reveals mild mouth dryness, tongue protrudes centrally in palate elevates symmetrically.  He has a nodding type head tremor fairly consistently, neck is supple, no voice tremor.    Chest: Clear to auscultation without wheezing, rhonchi or crackles noted.  Heart: S1+S2+0, irregularly irreg.    Abdomen: Soft, non-tender and non-distended with normal bowel sounds appreciated on auscultation.  Extremities: There is no pitting edema in the distal lower extremities bilaterally.   Skin: Warm and dry without trophic changes noted.  Musculoskeletal: exam reveals left hip pain, increase in lumbar kyphosis, and arthritic changes noted in both hands.    Neurologically:  Mental status: The patient is awake, alert and oriented in all 4 spheres. His immediate and remote memory, attention, language skills and fund of knowledge are appropriate. There is no evidence of aphasia, agnosia, apraxia or anomia. Speech is clear with normal prosody and enunciation. Thought process is linear. Mood is normal and  affect is normal.  Cranial nerves II - XII are as described above under HEENT exam. In addition: shoulder shrug is normal with equal shoulder height noted. Motor exam: Normal bulk, strength and tone is noted. There is no drift, rest tremor or rebound. On 08/13/2019: On  Archimedes spiral drawing he has mild trembling with the left and right hand, handwriting is not particularly tremulous, legible, not micrographic.  He has a mild bilateral upper extremity postural tremor, slight action tremor, no resting tremor, no intention tremor.  No lower extremity tremors noted.  Romberg is negative. Reflexes are 1+ in the UEs and trace in the LEs, absent in the ankles. Fine motor skills and coordination: overall mild impairment with finger taps, hand movements, rapid alternating patting, foot taps and foot agility; mildly impaired, nonspecific, no decrement in amplitude noted.  Cerebellar testing: No dysmetria or intention tremor on finger to nose testing. Heel to shin is unremarkable bilaterally. There is no truncal or gait ataxia.  Sensory exam: intact to light touch in the upper and lower extremities.  Gait, station and balance: He stands with mild difficulty, he has mild increase in lumbar kyphosis, walks with a slight limp on the left, no shuffling, has preserved arm swing bilaterally.   Assessment and Plan:   Assessment and Plan:  In summary, CHANEL HOVEY is a very pleasant 79 y.o.-year old male with an underlying complex medical history of hypertension, hyperlipidemia, chronic A. fib, status post ICD placement, coronary artery disease, congestive heart failure, nonischemic cardiomyopathy, lumbar spinal stenosis, obstructive sleep apnea on PAP therapy, degenerative disc disease, L hip pain and obesity, who presents for evaluation of his tremors, for second opinion.  On examination he has a head/neck tremor and bilateral upper extremity postural and action tremor, his history and examination are in keeping  with essential tremor.  He is already on a beta-blocker.  He may have tried another type of beta-blocker in the past.  He is advised that symptomatic treatment options are limited unfortunately to begin with but in his particular case I would not favor adding a second beta-blocker to his medication regimen.  In addition, we do consider Mysoline or primidone sometimes but there is a chance that this medication may interfere with his Xarelto.  In addition, we have to be cautious with medication such as Mysoline as he may have side effects including sedation and balance problems.  All in all, a trial of medication may be more at risk for causing him side effects or medication interactions and therefore a trial of medication does not appear to be justified in my opinion.  We talked about tremor triggers.  He is encouraged to stay well-hydrated, well rested, avoid caffeine.  He had a TSH checked about a year ago which was normal at the time.  He is advised to keep his follow-up with his primary neurologist as scheduled and follow-up with you as planned as well.  I reassured him that I did not detect any signs of parkinsonism. We do not need to make a follow up appointment. I answered all their questions today and the patient and his wife were in agreement.  Thank you very much for allowing me to participate in the care of this nice patient. If I can be of any further assistance to you please do not hesitate to call me at [phone].  Sincerely,   Star Age, MD, PhD

## 2019-08-13 NOTE — Patient Instructions (Signed)
You have a tremor of both hands and the neck/head area. Your history and exam are in keeping with what we call essential tremor.  I do not see any signs or symptoms of parkinson's like disease or what we call parkinsonism.   For your tremor, I would not recommend any new medication for fear of side effects or medication interactions, especially in light of you taking an beta blocker already and the Xarelto. We do not have to make a follow up appointment. Please follow up with your primary care and your neurologist provider as scheduled.   Please remember, that any kind of tremor may be exacerbated by anxiety, anger, nervousness, excitement, dehydration, sleep deprivation, by caffeine, and low blood sugar values or blood sugar fluctuations and thyroid dysfunction. Some medications can exacerbate tremors, this includes certain antidepressant medications.

## 2019-09-01 DIAGNOSIS — G4733 Obstructive sleep apnea (adult) (pediatric): Secondary | ICD-10-CM | POA: Diagnosis not present

## 2019-09-03 ENCOUNTER — Ambulatory Visit (INDEPENDENT_AMBULATORY_CARE_PROVIDER_SITE_OTHER): Payer: PPO | Admitting: *Deleted

## 2019-09-03 DIAGNOSIS — Z95810 Presence of automatic (implantable) cardiac defibrillator: Secondary | ICD-10-CM

## 2019-09-03 LAB — CUP PACEART REMOTE DEVICE CHECK
Battery Remaining Longevity: 109 mo
Battery Voltage: 3.01 V
Brady Statistic AP VP Percent: 0 %
Brady Statistic AP VS Percent: 0 %
Brady Statistic AS VP Percent: 3.31 %
Brady Statistic AS VS Percent: 96.69 %
Brady Statistic RA Percent Paced: 0 %
Brady Statistic RV Percent Paced: 5.91 %
Date Time Interrogation Session: 20210427022826
HighPow Impedance: 67 Ohm
Implantable Lead Implant Date: 20130502
Implantable Lead Implant Date: 20130502
Implantable Lead Location: 753859
Implantable Lead Location: 753860
Implantable Lead Model: 5076
Implantable Lead Model: 6935
Implantable Pulse Generator Implant Date: 20181031
Lead Channel Impedance Value: 437 Ohm
Lead Channel Impedance Value: 494 Ohm
Lead Channel Impedance Value: 608 Ohm
Lead Channel Pacing Threshold Amplitude: 0.75 V
Lead Channel Pacing Threshold Pulse Width: 0.4 ms
Lead Channel Sensing Intrinsic Amplitude: 7 mV
Lead Channel Sensing Intrinsic Amplitude: 7 mV
Lead Channel Setting Pacing Amplitude: 2.5 V
Lead Channel Setting Pacing Pulse Width: 0.4 ms
Lead Channel Setting Sensing Sensitivity: 0.3 mV

## 2019-09-04 NOTE — Progress Notes (Signed)
ICD Remote  

## 2019-09-22 ENCOUNTER — Other Ambulatory Visit: Payer: Self-pay | Admitting: Internal Medicine

## 2019-10-01 DIAGNOSIS — G4733 Obstructive sleep apnea (adult) (pediatric): Secondary | ICD-10-CM | POA: Diagnosis not present

## 2019-10-24 DIAGNOSIS — M25552 Pain in left hip: Secondary | ICD-10-CM | POA: Diagnosis not present

## 2019-10-24 DIAGNOSIS — S40862A Insect bite (nonvenomous) of left upper arm, initial encounter: Secondary | ICD-10-CM | POA: Diagnosis not present

## 2019-10-24 DIAGNOSIS — W57XXXA Bitten or stung by nonvenomous insect and other nonvenomous arthropods, initial encounter: Secondary | ICD-10-CM | POA: Diagnosis not present

## 2019-10-29 DIAGNOSIS — M25552 Pain in left hip: Secondary | ICD-10-CM | POA: Diagnosis not present

## 2019-11-01 DIAGNOSIS — M25552 Pain in left hip: Secondary | ICD-10-CM | POA: Diagnosis not present

## 2019-11-01 DIAGNOSIS — G4733 Obstructive sleep apnea (adult) (pediatric): Secondary | ICD-10-CM | POA: Diagnosis not present

## 2019-11-04 DIAGNOSIS — M25552 Pain in left hip: Secondary | ICD-10-CM | POA: Diagnosis not present

## 2019-11-06 DIAGNOSIS — M25552 Pain in left hip: Secondary | ICD-10-CM | POA: Diagnosis not present

## 2019-11-13 DIAGNOSIS — M25552 Pain in left hip: Secondary | ICD-10-CM | POA: Diagnosis not present

## 2019-11-15 DIAGNOSIS — M25552 Pain in left hip: Secondary | ICD-10-CM | POA: Diagnosis not present

## 2019-11-18 DIAGNOSIS — M25552 Pain in left hip: Secondary | ICD-10-CM | POA: Diagnosis not present

## 2019-11-20 DIAGNOSIS — M25552 Pain in left hip: Secondary | ICD-10-CM | POA: Diagnosis not present

## 2019-11-27 DIAGNOSIS — M25552 Pain in left hip: Secondary | ICD-10-CM | POA: Diagnosis not present

## 2019-11-29 DIAGNOSIS — M25552 Pain in left hip: Secondary | ICD-10-CM | POA: Diagnosis not present

## 2019-12-01 DIAGNOSIS — G4733 Obstructive sleep apnea (adult) (pediatric): Secondary | ICD-10-CM | POA: Diagnosis not present

## 2019-12-03 ENCOUNTER — Ambulatory Visit (INDEPENDENT_AMBULATORY_CARE_PROVIDER_SITE_OTHER): Payer: PPO | Admitting: *Deleted

## 2019-12-03 DIAGNOSIS — I428 Other cardiomyopathies: Secondary | ICD-10-CM

## 2019-12-03 DIAGNOSIS — M25552 Pain in left hip: Secondary | ICD-10-CM | POA: Diagnosis not present

## 2019-12-04 LAB — CUP PACEART REMOTE DEVICE CHECK
Battery Remaining Longevity: 105 mo
Battery Voltage: 3 V
Brady Statistic AP VP Percent: 0 %
Brady Statistic AP VS Percent: 0 %
Brady Statistic AS VP Percent: 3.57 %
Brady Statistic AS VS Percent: 96.43 %
Brady Statistic RA Percent Paced: 0 %
Brady Statistic RV Percent Paced: 5.17 %
Date Time Interrogation Session: 20210727043626
HighPow Impedance: 63 Ohm
Implantable Lead Implant Date: 20130502
Implantable Lead Implant Date: 20130502
Implantable Lead Location: 753859
Implantable Lead Location: 753860
Implantable Lead Model: 5076
Implantable Lead Model: 6935
Implantable Pulse Generator Implant Date: 20181031
Lead Channel Impedance Value: 380 Ohm
Lead Channel Impedance Value: 494 Ohm
Lead Channel Impedance Value: 551 Ohm
Lead Channel Pacing Threshold Amplitude: 0.625 V
Lead Channel Pacing Threshold Pulse Width: 0.4 ms
Lead Channel Sensing Intrinsic Amplitude: 6.375 mV
Lead Channel Sensing Intrinsic Amplitude: 6.375 mV
Lead Channel Setting Pacing Amplitude: 2.5 V
Lead Channel Setting Pacing Pulse Width: 0.4 ms
Lead Channel Setting Sensing Sensitivity: 0.3 mV

## 2019-12-06 DIAGNOSIS — M25552 Pain in left hip: Secondary | ICD-10-CM | POA: Diagnosis not present

## 2019-12-06 NOTE — Progress Notes (Signed)
Remote ICD transmission.   

## 2019-12-10 DIAGNOSIS — M25552 Pain in left hip: Secondary | ICD-10-CM | POA: Diagnosis not present

## 2019-12-13 DIAGNOSIS — M25552 Pain in left hip: Secondary | ICD-10-CM | POA: Diagnosis not present

## 2019-12-16 DIAGNOSIS — M25552 Pain in left hip: Secondary | ICD-10-CM | POA: Diagnosis not present

## 2019-12-17 ENCOUNTER — Other Ambulatory Visit: Payer: Self-pay

## 2019-12-17 ENCOUNTER — Encounter: Payer: Self-pay | Admitting: Internal Medicine

## 2019-12-17 ENCOUNTER — Ambulatory Visit (INDEPENDENT_AMBULATORY_CARE_PROVIDER_SITE_OTHER): Payer: PPO | Admitting: Internal Medicine

## 2019-12-17 VITALS — BP 136/80 | HR 69 | Ht 66.0 in | Wt 215.5 lb

## 2019-12-17 DIAGNOSIS — I4821 Permanent atrial fibrillation: Secondary | ICD-10-CM

## 2019-12-17 DIAGNOSIS — I428 Other cardiomyopathies: Secondary | ICD-10-CM

## 2019-12-17 DIAGNOSIS — Z95810 Presence of automatic (implantable) cardiac defibrillator: Secondary | ICD-10-CM

## 2019-12-17 DIAGNOSIS — I5022 Chronic systolic (congestive) heart failure: Secondary | ICD-10-CM

## 2019-12-17 NOTE — Patient Instructions (Signed)

## 2019-12-17 NOTE — Progress Notes (Signed)
Patient Care Team: [name], MD as PCP - General (Family Medicine) [name], MD as Consulting Physician (Cardiology) Murrell Redden, MD as Consulting Physician (Urology)   HPI  Chris Price is a 79 y.o. male Seen in followup for an ICD implanted for primary prevention in nonischemic cardiomyopathy further complicated by sinus bradycardia/ brady -date of implant May 2013.  He underwent device generator replacement 10/18.   History of atrial fibrillation for which he was managed with dabigitran and amiodarone. He developed a significant intention tremor prompting the down titration and subsequent discontinuation of his amiodarone No change in his tremor  Atrial fibrillation is now permanent.    At last visit, add bisoprolol for HR  With improvement  Still with some SOB with moderate exertion  Tremor with tools  Not playiing golf secondary to hip pain.          DATE Test    3/15 Cath   EF 35 % Nonobstructive disease               Date Cr K Hgb  10/18 0.95 4.9    3/20 0.96 4.5 14.8  3/21 1.03 4.3 14.9   .   Past Medical History:  Diagnosis Date  . Atrial fibrillation ([place])   . Cardiomyopathy -nonischemic    moderate to severe EF 25-30% March 2013  . CHF (congestive heart failure) ([place])   . Chronic systolic heart failure ([place]) 08/25/2011  . Coronary artery disease 08/15/2011   Moderate nonobstructive CAD with 60% mid LAD, 60% proximal RCA and 30 % stenossis left circumflex. Cardiac catheterization in March of 2015 showed no significant change   . Degenerative disk disease    lumbar  . Disc herniation   . GERD (gastroesophageal reflux disease)   . Hyperlipidemia   . Hypertension   . ICD (implantable cardiac defibrillator) in place   . IVCD (intraventricular conduction defect)    Nonspecific QRS duration 122  . Lumbar spinal stenosis   . Neuromuscular disorder ([place])    neauropathy   / siatica  . Persistent atrial fibrillation ([place]) 11/07/2011  .  Sleep apnea    uses cpap    Past Surgical History:  Procedure Laterality Date  . CARDIAC [facility]HE[facility]ATION  [facility]   [facility]   . CARDIAC CATHETERIZATION  07/09/2013   [facility]  . CARDIOVERSION  08/2011   [facility]  . CHOLECYSTECTOMY  April 2015  . COLONOSCOPY WITH PROPOFOL N/A 07/30/2015   Procedure: COLONOSCOPY WITH PROPOFOL;  Surgeon: Lollie Sails, MD;  Location[facility] ENDOSCOPY;  Service: Endoscopy;  Laterality: N/A;  . cooled thermotherapy  3329   [place], complicated by incontinence  . HERNIA REPAIR    . ICD  09/08/2011  . ICD GENERATOR CHANGEOUT N/A 03/08/2017   Procedure: ICD GENERATOR CHANGEOUT;  Surgeon: [name], MD;  Location: [place] [facility] CV LAB;  Service: Cardiovascular;  Laterality: N/A;  . IMPLANTABLE CARDIOVERTER DEFIBRILLATOR IMPLANT  09/08/2011   Procedure: IMPLANTABLE CARDIOVERTER DEFIBRILLATOR IMPLANT;  Surgeon: [name], MD;  Location: [facility] CATH LAB;  Service: Cardiovascular;;  . LUMBAR LAMINECTOMY     L2 through S1 with wide decompression of the thecal sac and nerve roots.  Marland Kitchen PROSTATE BIOPSY  2013    Current Outpatient Medications  Medication Sig Dispense Refill  . bisoprolol (ZEBETA) 5 MG tablet TAKE 1/2 TABLET BY MOUTH TWICE A DAY 90 tablet 0  . ezetimibe (ZETIA) 10 MG tablet Take 1 tablet (10 mg total) by mouth daily. [place]  tablet 1  . finasteride (PROSCAR) 5 MG tablet Take 1 tablet (5 mg total) by mouth daily. 90 tablet 3  . fluticasone (FLONASE) 50 MCG/ACT nasal spray Place 2 sprays into both nostrils daily. 16 g 2  . furosemide (LASIX) 20 MG tablet TAKE ONE TABLET BY MOUTH DAILY 90 tablet 2  . lisinopril (ZESTRIL) 2.5 MG tablet Take 1 tablet (2.5 mg total) by mouth daily. 90 tablet 1  . multivitamin (ONE-A-DAY MEN'S) TABS tablet Take 1 tablet by mouth daily.    . nitroGLYCERIN (NITROSTAT) 0.4 MG SL tablet Place 1 tablet (0.4 mg total) under the tongue every 5 (five) minutes as needed for chest pain. 25 tablet 3  . omeprazole (PRILOSEC) 20 MG capsule TAKE 1  CAPSULE 2 TIMES DAILY BEFORE A MEAL. FOR ACID REFLUX. (Patient taking differently: Take 20 mg by mouth 2 (two) times daily as needed (heartburn). ) 60 capsule 5  . rosuvastatin (CRESTOR) 40 MG tablet Take 1 tablet (40 mg total) by mouth daily. 90 tablet 1  . traZODone (DESYREL) 100 MG tablet Take 1 tablet (100 mg total) by mouth at bedtime. 90 tablet 1  . vitamin C (ASCORBIC ACID) 500 MG tablet Take 500 mg by mouth daily.    Alveda Reasons 20 MG TABS tablet TAKE ONE TABLET BY MOUTH DAILY WITH SUPPER 90 tablet 1   No current facility-administered medications for this visit.    Allergies  Allergen Reactions  . Coreg [Carvedilol] Other (See Comments)    hypotension  . Flomax [Tamsulosin Hcl] Other (See Comments)    Hypotension    Review of Systems negative except from HPI and PMH  Physical Exam BP 136/80 (BP Location: Left Arm, Patient Position: Sitting, Cuff Size: Normal)   Pulse 69   Ht 5\' 6"  (1.676 m)   Wt 215 lb 8 oz (97.8 kg)   SpO2 96%   BMI 34.78 kg/m  Well developed and well nourished in no acute distress HENT normal Neck supple with JVP-flat Clear Device pocket well healed; without hematoma or erythema.  There is no tethering  Regular rate and rhythm, no murmur Abd-soft with active BS No Clubbing cyanosis  edema Skin-warm and dry A & Oriented  Grossly normal sensory and motor function  ECG atrial fibrillation at 69 Intervals-/13/43 Axis left -74  Assessment and  Plan  NICM  Afib-permanent  HTN  IVCD/LBBB  Tremor  ICD-MDT   Device function is normal  Blood pressure well controlled  Heart rate excursion relatively well controlled.  It is hard to know whether some of his dyspnea is related to more rapid rates.  It is also been some years since his ejection fraction was assessed.  We will undertake an echo.  There may be a role for transition to [facility] which may improve functional capacity.  ,On Anticoagulation;  No bleeding issues

## 2019-12-21 ENCOUNTER — Other Ambulatory Visit: Payer: Self-pay | Admitting: Internal Medicine

## 2019-12-21 DIAGNOSIS — I428 Other cardiomyopathies: Secondary | ICD-10-CM

## 2019-12-23 NOTE — Telephone Encounter (Signed)
Last OV 12/17/19 Scr 1.03 on 07/31/19 ABW crcl 79

## 2020-01-01 DIAGNOSIS — G4733 Obstructive sleep apnea (adult) (pediatric): Secondary | ICD-10-CM | POA: Diagnosis not present

## 2020-01-30 NOTE — Progress Notes (Signed)
Name: Chris Price   MRN: [id number]    DOB: 1940-10-17   Date:01/31/2020       Progress Note  Subjective  Chief Complaint  Chief Complaint  Patient presents with  . Atrial Fibrillation  . Dyslipidemia  . Hyperglycemia  . Sleep Apnea    HPI  Chronic afib: he sees Dr. Jens Som, no palpitation or SOB, he has a pacemaker,he is on beta-blocker and back on lisinopril   Chronic bronchitis: : he never smoked, second hand smoking as a child - both parents smoked - He has chronic cough, he has has a productive cough throughout the day, white sputum, no wheezing, he has sob during activity but stable and likely multifactorial   IT band syndrome left and also OA left hip: he was seen at [facility] and was advised to have PT, he states symptoms has improved symptoms. He is still doing the home PT exercises and is also swimming at the [employer] pool   CHF : he is doing well,he denies chest pain, he has sob with moderate activity - but stable,  Orthopnea, he has mild lower extremity edema.  He sees cardiologist,he is off spironolactoneback on beta-blocker, and also on low dose of lisinopril and is doing well at this time   OSA/CPAP: he is very compliant with CPAP,  However he forget his CPAP machine at home and did not sleep well during his last trip to the beach. He did not sleep well   Pre-diabetes: last hgbA1Cwas slightly lower at 5.7 % , he likes ice cream but only twice a week, denies polyphagia, polydipsia or polyuria  HTN:bp at home between 100-119/68-75 denies  dizziness, denies chest pain or palpitation  Paresthesia both feet: going on since 2007 after back surgery - Dr. Rennis Harding, but getting progressively worse.Last b12 level was normal, discussed gabapentin but he does not want it at this time, since no pain, he only has  numbness  Insomnia:heis currently taking Trazodone 100 mg and melatonin  and is working well for him, he is wearing his CPAP , he goes to bed  late, and wakes up once at night, but able to fall back asleep   Tremors hands: going on for years, but worse since 2019 ,seen at [facility] , he has a second opinion done at [facility] neurology. Reviewed the notes . No symptoms of Parkinson's advised to continue bizoprolol and advised to avoid [place] since it can cause more side effects   Hearing loss: he sees ENT to check the [facility] aids[facility]   BPH with LUTS: seeing Urologist , seeing Dr. Erlene Quan yearly now and is doing well , nocturia once per night   Senile purpura: both arms, reassurance given   Morbid obesity: BMI above 35 with co-morbidities, continue regular physical activity and balanced diet   Patient Active Problem List   Diagnosis Date Noted  . Hearing loss, sensorineural 09/12/2017  . Prediabetes 07/01/2017  . Hx of colonic polyps 06/19/2016  . Long-term use of high-risk medication 06/19/2016  . Cardiomyopathy ([place]) 07/14/2015  . Low back pain due to displacement of intervertebral disc 04/09/2015  . Diarrhea following gastrointestinal surgery 09/04/2013  . S/P laparoscopic cholecystectomy 08/20/2013  . Chronic atrial fibrillation ([place]) 07/15/2013  . Morbid obesity ([place]) 02/28/2013  . OSA on CPAP 01/03/2013  . Hyperlipidemia with target low density lipoprotein (LDL) cholesterol less than 70 mg/dL 03/22/2012  . ED (erectile dysfunction) of organic origin 01/23/2012  . Benign localized prostatic  hyperplasia with lower urinary tract symptoms (LUTS) 01/23/2012  . Bladder outlet obstruction 12/25/2011  . Essential tremor 12/22/2011  . Hypertension   . Elevated PSA, less than 10 ng/ml 09/20/2011  . Automatic implantable cardioverter-defibrillator Medtronic dual-chamber 09/09/2011  . Chronic systolic heart failure ([place]) 08/25/2011  . IVCD (intraventricular conduction defect)   . Coronary artery disease 08/15/2011    Past Surgical History:  Procedure Laterality Date  . CARDIAC CATHETERIZATION   08/15/2011   [facility]   . CARDIAC CATHETERIZATION  07/09/2013   [facility]  . CARDIOVERSION  08/2011   [facility]  . CHOLECYSTECTOMY  April 2015  . COLONOSCOPY WITH PROPOFOL N/A 07/30/2015   Procedure: COLONOSCOPY WITH PROPOFOL;  Surgeon: Lollie Sails, MD;  Location: [facility] ENDOSCOPY;  Service: Endoscopy;  Laterality: N/A;  . cooled thermotherapy  2202   [place], complicated by incontinence  . HERNIA REPAIR    . ICD  09/08/2011  . ICD GENERATOR CHANGEOUT N/A 03/08/2017   Procedure: ICD GENERATOR CHANGEOUT;  Surgeon: [name], MD;  Location: [facility] CV LAB;  Service: Cardiovascular;  Laterality: N/A;  . IMPLANTABLE CARDIOVERTER DEFIBRILLATOR IMPLANT  09/08/2011   Procedure: IMPLANTABLE CARDIOVERTER DEFIBRILLATOR IMPLANT;  Surgeon: [name], MD;  Location: [facility] CATH LAB;  Service: Cardiovascular;;  . LUMBAR LAMINECTOMY     L2 through S1 with wide decompression of the thecal sac and nerve roots.  Marland Kitchen PROSTATE BIOPSY  2013    Family History  Problem Relation Age of Onset  . Heart attack Father   . Heart attack Brother   . Heart attack Son   . Ovarian cancer Daughter     Social History   Tobacco Use  . Smoking status: Never Smoker  . Smokeless tobacco: Never Used  Substance Use Topics  . Alcohol use: No     Current Outpatient Medications:  .  bisoprolol (ZEBETA) 5 MG tablet, TAKE 1/2 TABLET BY MOUTH TWICE A DAY, Disp: 90 tablet, Rfl: 0 .  ezetimibe (ZETIA) 10 MG tablet, Take 1 tablet (10 mg total) by mouth daily., Disp: 90 tablet, Rfl: 1 .  finasteride (PROSCAR) 5 MG tablet, Take 1 tablet (5 mg total) by mouth daily., Disp: 90 tablet, Rfl: 3 .  furosemide (LASIX) 20 MG tablet, TAKE ONE TABLET BY MOUTH DAILY, Disp: 90 tablet, Rfl: 2 .  lisinopril (ZESTRIL) 2.5 MG tablet, Take 1 tablet (2.5 mg total) by mouth daily., Disp: 90 tablet, Rfl: 1 .  multivitamin (ONE-A-DAY MEN'S) TABS tablet, Take 1 tablet by mouth daily., Disp: , Rfl:  .  nitroGLYCERIN (NITROSTAT) 0.4 MG SL tablet, Place 1  tablet (0.4 mg total) under the tongue every 5 (five) minutes as needed for chest pain., Disp: 25 tablet, Rfl: 3 .  omeprazole (PRILOSEC) 20 MG capsule, TAKE 1 CAPSULE 2 TIMES DAILY BEFORE A MEAL. FOR ACID REFLUX. (Patient taking differently: Take 20 mg by mouth 2 (two) times daily as needed (heartburn). ), Disp: 60 capsule, Rfl: 5 .  rosuvastatin (CRESTOR) 40 MG tablet, Take 1 tablet (40 mg total) by mouth daily., Disp: 90 tablet, Rfl: 1 .  traZODone (DESYREL) 100 MG tablet, Take 1 tablet (100 mg total) by mouth at bedtime., Disp: 90 tablet, Rfl: 1 .  triamcinolone ointment (KENALOG) 0.1 %, Apply topically., Disp: , Rfl:  .  vitamin C (ASCORBIC ACID) 500 MG tablet, Take 500 mg by mouth daily., Disp: , Rfl:  .  XARELTO 20 MG TABS tablet, TAKE ONE TABLET BY MOUTH DAILY WITH SUPPER, Disp: 90 tablet,  Rfl: 1  Allergies  Allergen Reactions  . Coreg [Carvedilol] Other (See Comments)    hypotension  . Flomax [Tamsulosin Hcl] Other (See Comments)    Hypotension    I personally reviewed active problem list, medication list, allergies, family history, social history, health maintenance with the patient/caregiver today.   ROS  Constitutional: Negative for fever or weight change.  Respiratory: Negative for cough and shortness of breath.   Cardiovascular: Negative for chest pain or palpitations.  Gastrointestinal: Negative for abdominal pain, no bowel changes.  Musculoskeletal: Negative for gait problem or joint swelling.  Skin: Negative for rash.  Neurological: Negative for dizziness or headache.  No other specific complaints in a complete review of systems (except as listed in HPI above).  Objective  Vitals:   01/31/20 1003  BP: 128/84  Pulse: 80  Resp: 16  Temp: 97.6 F (36.4 C)  TempSrc: Oral  SpO2: 99%  Weight: 220 lb 4.8 oz (99.9 kg)  Height: 5\' 6"  (1.676 m)    Body mass index is 35.56 kg/m.  Physical Exam  Constitutional: Patient appears well-developed and well-nourished.  Obese  No distress.  HEENT: head atraumatic, normocephalic, pupils equal and reactive to light, neck supple, strabismus  Cardiovascular: Normal rate, regular rhythm and normal heart sounds.  No murmur heard. No BLE edema. Pulmonary/Chest: Effort normal and breath sounds normal. No respiratory distress. Abdominal: Soft.  There is no tenderness. Psychiatric: Patient has a normal mood and affect. behavior is normal. Judgment and thought content normal.  Recent Results (from the past 2160 hour(s))  CUP PACEART REMOTE DEVICE CHECK     Status: None   Collection Time: 12/03/19  4:36 AM  Result Value Ref Range   Date Time Interrogation Session [id number]    Pulse Generator Manufacturer MERM    Pulse Gen Model [id number] Evera MRI XT DR    Pulse Gen Serial Number [id number]    Clinic Name [facility]    Implantable Pulse Generator Type Implantable Cardiac Defibulator    Implantable Pulse Generator Implant Date [id number]    Implantable Lead Manufacturer MERM    Implantable Lead Model 5076 CapSureFix Novus    Implantable Lead Serial Number [id number]    Implantable Lead Implant Date 99242683    Implantable Lead Location Detail 1 APPENDAGE    Implantable Lead Location [id number]    Implantable Lead Manufacturer [facility]    Implantable Lead Model 6935 Sprint Quattro Secure S    Implantable Lead Serial Number [id number]    Implantable Lead Implant Date [id number]    Implantable Lead Location Detail 1 APEX    Implantable Lead Location [id number]    Lead Channel Setting Sensing Sensitivity 0.3 mV   Lead Channel Setting Pacing Pulse Width 0.4 ms   Lead Channel Setting Pacing Amplitude 2.5 V   Lead Channel Impedance Value 494 ohm   Lead Channel Impedance Value 551 ohm   Lead Channel Impedance Value 380 ohm   Lead Channel Sensing Intrinsic Amplitude 6.375 mV   Lead Channel Sensing Intrinsic Amplitude 6.375 mV   Lead Channel Pacing Threshold Amplitude 0.625 V   Lead Channel Pacing Threshold Pulse Width 0.4  ms   HighPow Impedance 63 ohm   Battery Status OK    Battery Remaining Longevity 105 mo   Battery Voltage 3.00 V   Brady Statistic RA Percent Paced 0 %   Brady Statistic RV Percent Paced 5.17 %   Brady Statistic AP VP Percent 0 %   Brady Statistic AS VP Percent 3.57 %  Brady Statistic AP VS Percent 0 %   Brady Statistic AS VS Percent 96.43 %   Eval Rhythm atrial fib      PHQ2/9: Depression screen [facility] 2/9 01/31/2020 07/31/2019 05/21/2019 01/25/2019 07/17/2018  Decreased Interest 0 0 0 0 0  Down, Depressed, Hopeless 0 0 0 0 0  PHQ - 2 Score 0 0 0 0 0  Altered sleeping - 0 - 0 0  Tired, decreased energy - 0 - 0 0  Change in appetite - 0 - 0 0  Feeling bad or failure about yourself  - 0 - 0 0  Trouble concentrating - 0 - 0 0  Moving slowly or fidgety/restless - 0 - 0 0  Suicidal thoughts - 0 - 0 0  PHQ-9 Score - 0 - 0 0  Difficult doing work/chores - - - - Not difficult at all    phq 9 is negative   Fall Risk: Fall Risk  01/31/2020 07/31/2019 05/21/2019 01/25/2019 07/17/2018  Falls in the past year? 0 0 0 1 0  Number falls in past yr: 0 0 0 1 0  Injury with Fall? 0 0 0 1 0  Follow up - - Falls prevention discussed - -     Functional Status Survey: Is the patient deaf or have difficulty hearing?: Yes Does the patient have difficulty seeing, even when wearing glasses/contacts?: Yes Does the patient have difficulty concentrating, remembering, or making decisions?: No Does the patient have difficulty walking or climbing stairs?: No Does the patient have difficulty dressing or bathing?: No Does the patient have difficulty doing errands alone such as visiting a doctor's office or shopping?: No    Assessment & Plan  1. Dyslipidemia  - ezetimibe (ZETIA) 10 MG tablet; Take 1 tablet (10 mg total) by mouth daily.  Dispense: 90 tablet; Refill: 1 - rosuvastatin (CRESTOR) 40 MG tablet; Take 1 tablet (40 mg total) by mouth daily.  Dispense: 90 tablet; Refill: 1  2. Chronic systolic  heart failure (HCC)  - lisinopril (ZESTRIL) 2.5 MG tablet; Take 1 tablet (2.5 mg total) by mouth daily.  Dispense: 90 tablet; Refill: 1  3. Primary insomnia  - traZODone (DESYREL) 100 MG tablet; Take 1 tablet (100 mg total) by mouth at bedtime.  Dispense: 90 tablet; Refill: 1  4. Need for immunization against influenza  - Flu Vaccine QUAD High Dose(Fluad)  5. Chronic atrial fibrillation (HCC)  Rate controlled   6. OSA on CPAP  Compliant   7. Prediabetes   8. Essential hypertension  Doing well   9. Morbid obesity ([place])  Discussed with the patient the risk posed by an increased BMI. Discussed importance of portion control, calorie counting and at least 150 minutes of physical activity weekly. Avoid sweet beverages and drink more water. Eat at least 6 servings of fruit and vegetables daily   10. Senile purpura (HCC)  Stable   11. Left hip pain

## 2020-01-31 ENCOUNTER — Ambulatory Visit (INDEPENDENT_AMBULATORY_CARE_PROVIDER_SITE_OTHER): Payer: PPO | Admitting: Family Medicine

## 2020-01-31 ENCOUNTER — Other Ambulatory Visit: Payer: Self-pay

## 2020-01-31 ENCOUNTER — Encounter: Payer: Self-pay | Admitting: Family Medicine

## 2020-01-31 VITALS — BP 128/84 | HR 80 | Temp 97.6°F | Resp 16 | Ht 66.0 in | Wt 220.3 lb

## 2020-01-31 DIAGNOSIS — I10 Essential (primary) hypertension: Secondary | ICD-10-CM

## 2020-01-31 DIAGNOSIS — R7303 Prediabetes: Secondary | ICD-10-CM | POA: Diagnosis not present

## 2020-01-31 DIAGNOSIS — I5022 Chronic systolic (congestive) heart failure: Secondary | ICD-10-CM

## 2020-01-31 DIAGNOSIS — G4733 Obstructive sleep apnea (adult) (pediatric): Secondary | ICD-10-CM | POA: Diagnosis not present

## 2020-01-31 DIAGNOSIS — F5101 Primary insomnia: Secondary | ICD-10-CM | POA: Diagnosis not present

## 2020-01-31 DIAGNOSIS — D692 Other nonthrombocytopenic purpura: Secondary | ICD-10-CM | POA: Diagnosis not present

## 2020-01-31 DIAGNOSIS — E785 Hyperlipidemia, unspecified: Secondary | ICD-10-CM | POA: Diagnosis not present

## 2020-01-31 DIAGNOSIS — I482 Chronic atrial fibrillation, unspecified: Secondary | ICD-10-CM

## 2020-01-31 DIAGNOSIS — M25552 Pain in left hip: Secondary | ICD-10-CM | POA: Diagnosis not present

## 2020-01-31 DIAGNOSIS — Z23 Encounter for immunization: Secondary | ICD-10-CM | POA: Diagnosis not present

## 2020-01-31 DIAGNOSIS — Z9989 Dependence on other enabling machines and devices: Secondary | ICD-10-CM | POA: Diagnosis not present

## 2020-01-31 MED ORDER — LISINOPRIL 2.5 MG PO TABS: 2.5000 mg | ORAL_TABLET | Freq: Every day | ORAL | 1 refills | Status: DC

## 2020-01-31 MED ORDER — TRAZODONE HCL 100 MG PO TABS: 100.0000 mg | ORAL_TABLET | Freq: Every day | ORAL | 1 refills | Status: DC

## 2020-01-31 MED ORDER — ROSUVASTATIN CALCIUM 40 MG PO TABS: 40.0000 mg | ORAL_TABLET | Freq: Every day | ORAL | 1 refills | Status: DC

## 2020-01-31 MED ORDER — EZETIMIBE 10 MG PO TABS: 10.0000 mg | ORAL_TABLET | Freq: Every day | ORAL | 1 refills | Status: DC

## 2020-02-01 DIAGNOSIS — G4733 Obstructive sleep apnea (adult) (pediatric): Secondary | ICD-10-CM | POA: Diagnosis not present

## 2020-02-14 ENCOUNTER — Other Ambulatory Visit: Payer: Self-pay | Admitting: Family Medicine

## 2020-02-14 DIAGNOSIS — J302 Other seasonal allergic rhinitis: Secondary | ICD-10-CM

## 2020-02-17 NOTE — Telephone Encounter (Signed)
He hasn't taking it for awhile. He started back taking it because he had some allergy symptoms. He has ran out of what he had and needs a refill sent to [facility].

## 2020-03-02 DIAGNOSIS — G4733 Obstructive sleep apnea (adult) (pediatric): Secondary | ICD-10-CM | POA: Diagnosis not present

## 2020-03-03 ENCOUNTER — Ambulatory Visit (INDEPENDENT_AMBULATORY_CARE_PROVIDER_SITE_OTHER): Payer: PPO

## 2020-03-03 DIAGNOSIS — I429 Cardiomyopathy, unspecified: Secondary | ICD-10-CM

## 2020-03-03 LAB — CUP PACEART REMOTE DEVICE CHECK
Battery Remaining Longevity: 99 mo
Battery Voltage: 3 V
Brady Statistic AP VP Percent: 0 %
Brady Statistic AP VS Percent: 0 %
Brady Statistic AS VP Percent: 2.95 %
Brady Statistic AS VS Percent: 97.05 %
Brady Statistic RA Percent Paced: 0 %
Brady Statistic RV Percent Paced: 3.96 %
Date Time Interrogation Session: 20211026043626
HighPow Impedance: 66 Ohm
Implantable Lead Implant Date: 20130502
Implantable Lead Implant Date: 20130502
Implantable Lead Location: 753859
Implantable Lead Location: 753860
Implantable Lead Model: 5076
Implantable Lead Model: 6935
Implantable Pulse Generator Implant Date: 20181031
Lead Channel Impedance Value: 380 Ohm
Lead Channel Impedance Value: 494 Ohm
Lead Channel Impedance Value: 551 Ohm
Lead Channel Pacing Threshold Amplitude: 0.625 V
Lead Channel Pacing Threshold Pulse Width: 0.4 ms
Lead Channel Sensing Intrinsic Amplitude: 7.25 mV
Lead Channel Sensing Intrinsic Amplitude: 7.25 mV
Lead Channel Setting Pacing Amplitude: 2.5 V
Lead Channel Setting Pacing Pulse Width: 0.4 ms
Lead Channel Setting Sensing Sensitivity: 0.3 mV

## 2020-03-09 DIAGNOSIS — G4733 Obstructive sleep apnea (adult) (pediatric): Secondary | ICD-10-CM | POA: Diagnosis not present

## 2020-03-09 NOTE — Progress Notes (Signed)
Remote ICD transmission.   

## 2020-03-22 ENCOUNTER — Other Ambulatory Visit: Payer: Self-pay | Admitting: Internal Medicine

## 2020-03-22 ENCOUNTER — Other Ambulatory Visit: Payer: Self-pay | Admitting: Urology

## 2020-03-22 DIAGNOSIS — N401 Enlarged prostate with lower urinary tract symptoms: Secondary | ICD-10-CM

## 2020-03-22 DIAGNOSIS — I428 Other cardiomyopathies: Secondary | ICD-10-CM

## 2020-03-24 ENCOUNTER — Other Ambulatory Visit: Payer: Self-pay | Admitting: Internal Medicine

## 2020-03-24 ENCOUNTER — Other Ambulatory Visit: Payer: Self-pay | Admitting: Urology

## 2020-03-24 DIAGNOSIS — I428 Other cardiomyopathies: Secondary | ICD-10-CM

## 2020-03-24 DIAGNOSIS — N401 Enlarged prostate with lower urinary tract symptoms: Secondary | ICD-10-CM

## 2020-03-25 NOTE — Telephone Encounter (Signed)
This is a [place] pt 

## 2020-04-02 DIAGNOSIS — G4733 Obstructive sleep apnea (adult) (pediatric): Secondary | ICD-10-CM | POA: Diagnosis not present

## 2020-04-06 ENCOUNTER — Ambulatory Visit: Payer: Self-pay

## 2020-04-06 NOTE — Telephone Encounter (Signed)
Patients wife called (on [place]).  She states that her husband has had a cough for months which is now getting worse.  She states that she notices that he gets SOB just walking to the neighbors.  She states that his cough is productive and he is frequently coughing up clear mucus. He has no lung Hx.  He has A Fib. He has no fever or other symptoms.  They have recently been to [facility] to their time share.  She states that they ate in and only went out as necessary taking precautions. Per protocol  And office recommendation she will have her husband go to [facility] for evaluation today.  Reason for Disposition . [1] MILD difficulty breathing (e.g., minimal/no SOB at rest, SOB with walking, pulse <100) AND [2] still present when not coughing  Answer Assessment - Initial Assessment Questions 1. ONSET: "When did the cough begin?"      months 2. SEVERITY: "How bad is the cough today?"     Worse today and congested 3. SPUTUM: "Describe the color of your sputum" (none, dry cough; clear, white, yellow, green)    clear 4. HEMOPTYSIS: "Are you coughing up any blood?" If so ask: "How much?" (flecks, streaks, tablespoons, etc.)     no 5. DIFFICULTY BREATHING: "Are you having difficulty breathing?" If Yes, ask: "How bad is it?" (e.g., mild, moderate, severe)    - MILD: No SOB at rest, mild SOB with walking, speaks normally in sentences, can lay down, no retractions, pulse < 100.    - MODERATE: SOB at rest, SOB with minimal exertion and prefers to sit, cannot lie down flat, speaks in phrases, mild retractions, audible wheezing, pulse 100-120.    - SEVERE: Very SOB at rest, speaks in single words, struggling to breathe, sitting hunched forward, retractions, pulse > 120      Mild lays flat just when he walks 6. FEVER: "Do you have a fever?" If Yes, ask: "What is your temperature, how was it measured, and when did it start?"    no 7. CARDIAC HISTORY: "Do you have any history of heart disease?" (e.g., heart attack,  congestive heart failure)     A fib 8. LUNG HISTORY: "Do you have any history of lung disease?"  (e.g., pulmonary embolus, asthma, emphysema)    no 9. PE RISK FACTORS: "Do you have a history of blood clots?" (or: recent major surgery, recent prolonged travel, bedridden)     No has been to [employer] 10. OTHER SYMPTOMS: "Do you have any other symptoms?" (e.g., runny nose, wheezing, chest pain)      no 11. PREGNANCY: "Is there any chance you are pregnant?" "When was your last menstrual period?"      N/A 12. TRAVEL: "Have you traveled out of the country in the last month?" (e.g., travel history, exposures)       No just [facility] beach Nov 15-20th  Protocols used: [place]

## 2020-04-07 ENCOUNTER — Other Ambulatory Visit
Admission: RE | Admit: 2020-04-07 | Discharge: 2020-04-07 | Disposition: A | Discharge status: Home / Self Care | Payer: PPO | Source: Ambulatory Visit | Attending: Family Medicine | Admitting: Family Medicine

## 2020-04-07 DIAGNOSIS — R059 Cough, unspecified: Secondary | ICD-10-CM | POA: Diagnosis not present

## 2020-04-07 DIAGNOSIS — I509 Heart failure, unspecified: Secondary | ICD-10-CM | POA: Diagnosis not present

## 2020-04-07 DIAGNOSIS — Z03818 Encounter for observation for suspected exposure to other biological agents ruled out: Secondary | ICD-10-CM | POA: Diagnosis not present

## 2020-04-07 DIAGNOSIS — I482 Chronic atrial fibrillation, unspecified: Secondary | ICD-10-CM | POA: Diagnosis not present

## 2020-04-07 DIAGNOSIS — G4733 Obstructive sleep apnea (adult) (pediatric): Secondary | ICD-10-CM | POA: Diagnosis not present

## 2020-04-07 DIAGNOSIS — R350 Frequency of micturition: Secondary | ICD-10-CM | POA: Diagnosis not present

## 2020-04-07 DIAGNOSIS — J90 Pleural effusion, not elsewhere classified: Secondary | ICD-10-CM | POA: Diagnosis not present

## 2020-04-07 DIAGNOSIS — R0600 Dyspnea, unspecified: Secondary | ICD-10-CM | POA: Diagnosis not present

## 2020-04-07 DIAGNOSIS — I5022 Chronic systolic (congestive) heart failure: Secondary | ICD-10-CM | POA: Diagnosis not present

## 2020-04-07 LAB — D-DIMER, QUANTITATIVE: D-Dimer, Quant: <0.27 ug/mL-FEU (ref 0.00–0.50)

## 2020-04-07 LAB — BRAIN NATRIURETIC PEPTIDE: B Natriuretic Peptide: 170.2 pg/mL — ABNORMAL HIGH (ref 0.0–100.0)

## 2020-04-29 ENCOUNTER — Other Ambulatory Visit: Payer: Self-pay | Admitting: Internal Medicine

## 2020-04-29 DIAGNOSIS — I428 Other cardiomyopathies: Secondary | ICD-10-CM

## 2020-04-29 NOTE — Telephone Encounter (Signed)
Pt's age 79, wt 99.9 kg, SCr 1.03, CrCl 79.48, last ov w/ SK 12/17/19.

## 2020-04-29 NOTE — Telephone Encounter (Signed)
Refill request

## 2020-04-29 NOTE — Telephone Encounter (Signed)
This is a [place] pt 

## 2020-05-02 DIAGNOSIS — G4733 Obstructive sleep apnea (adult) (pediatric): Secondary | ICD-10-CM | POA: Diagnosis not present

## 2020-05-09 DIAGNOSIS — G4733 Obstructive sleep apnea (adult) (pediatric): Secondary | ICD-10-CM | POA: Diagnosis not present

## 2020-05-24 ENCOUNTER — Other Ambulatory Visit: Payer: Self-pay | Admitting: Family Medicine

## 2020-05-24 DIAGNOSIS — I5022 Chronic systolic (congestive) heart failure: Secondary | ICD-10-CM

## 2020-05-26 ENCOUNTER — Ambulatory Visit: Payer: PPO

## 2020-06-02 ENCOUNTER — Ambulatory Visit (INDEPENDENT_AMBULATORY_CARE_PROVIDER_SITE_OTHER): Payer: PPO

## 2020-06-02 DIAGNOSIS — I429 Cardiomyopathy, unspecified: Secondary | ICD-10-CM

## 2020-06-02 DIAGNOSIS — I5022 Chronic systolic (congestive) heart failure: Secondary | ICD-10-CM

## 2020-06-02 DIAGNOSIS — G4733 Obstructive sleep apnea (adult) (pediatric): Secondary | ICD-10-CM | POA: Diagnosis not present

## 2020-06-02 LAB — CUP PACEART REMOTE DEVICE CHECK
Battery Remaining Longevity: 95 mo
Battery Voltage: 3 V
Brady Statistic AP VP Percent: 0 %
Brady Statistic AP VS Percent: 0 %
Brady Statistic AS VP Percent: 4.13 %
Brady Statistic AS VS Percent: 95.87 %
Brady Statistic RA Percent Paced: 0 %
Brady Statistic RV Percent Paced: 3.66 %
Date Time Interrogation Session: 20220125043824
HighPow Impedance: 73 Ohm
Implantable Lead Implant Date: 20130502
Implantable Lead Implant Date: 20130502
Implantable Lead Location: 753859
Implantable Lead Location: 753860
Implantable Lead Model: 5076
Implantable Lead Model: 6935
Implantable Pulse Generator Implant Date: 20181031
Lead Channel Impedance Value: 380 Ohm
Lead Channel Impedance Value: 494 Ohm
Lead Channel Impedance Value: 551 Ohm
Lead Channel Pacing Threshold Amplitude: 0.625 V
Lead Channel Pacing Threshold Pulse Width: 0.4 ms
Lead Channel Sensing Intrinsic Amplitude: 6.75 mV
Lead Channel Sensing Intrinsic Amplitude: 6.75 mV
Lead Channel Setting Pacing Amplitude: 2.5 V
Lead Channel Setting Pacing Pulse Width: 0.4 ms
Lead Channel Setting Sensing Sensitivity: 0.3 mV

## 2020-06-09 ENCOUNTER — Ambulatory Visit (INDEPENDENT_AMBULATORY_CARE_PROVIDER_SITE_OTHER): Payer: PPO

## 2020-06-09 DIAGNOSIS — Z0000 Encounter for general adult medical examination without abnormal findings: Secondary | ICD-10-CM

## 2020-06-09 NOTE — Patient Instructions (Signed)
[name] , Thank you for taking time to come for your Medicare Wellness Visit. I appreciate your ongoing commitment to your health goals. Please review the following plan we discussed and let me know if I can assist you in the future.   Screening recommendations/referrals: Colonoscopy: no longer required Recommended yearly ophthalmology/optometry visit for glaucoma screening and checkup Recommended yearly dental visit for hygiene and checkup  Vaccinations: Influenza vaccine: done 01/31/20 Pneumococcal vaccine: done 05/14/15 Tdap vaccine: done 03/28/11 Shingles vaccine: done 10/29/19 & 12/31/19   Covid-19: done 06/03/19 & 06/24/19; we will contact [place] for your booster vaccine information   Conditions/risks identified: Recommend continuing physical activity for strength and mobility  Next appointment: Follow up in one year for your annual wellness visit.   Preventive Care 82 Years and Older, Male Preventive care refers to lifestyle choices and visits with your health care provider that can promote health and wellness. What does preventive care include?  A yearly physical exam. This is also called an annual well check.  Dental exams once or twice a year.  Routine eye exams. Ask your health care provider how often you should have your eyes checked.  Personal lifestyle choices, including:  Daily care of your teeth and gums.  Regular physical activity.  Eating a healthy diet.  Avoiding tobacco and drug use.  Limiting alcohol use.  Practicing safe sex.  Taking low doses of aspirin every day.  Taking vitamin and mineral supplements as recommended by your health care provider. What happens during an annual well check? The services and screenings done by your health care provider during your annual well check will depend on your age, overall health, lifestyle risk factors, and family history of disease. Counseling  Your health care provider may ask you questions  about your:  Alcohol use.  Tobacco use.  Drug use.  Emotional well-being.  Home and relationship well-being.  Sexual activity.  Eating habits.  History of falls.  Memory and ability to understand (cognition).  Work and work Statistician. Screening  You may have the following tests or measurements:  Height, weight, and BMI.  Blood pressure.  Lipid and cholesterol levels. These may be checked every 5 years, or more frequently if you are over 4 years old.  Skin check.  Lung cancer screening. You may have this screening every year starting at age 82 if you have a 30-pack-year history of smoking and currently smoke or have quit within the past 15 years.  Fecal occult blood test (FOBT) of the stool. You may have this test every year starting at age 64.  Flexible sigmoidoscopy or colonoscopy. You may have a sigmoidoscopy every 5 years or a colonoscopy every 10 years starting at age 65.  Prostate cancer screening. Recommendations will vary depending on your family history and other risks.  Hepatitis C blood test.  Hepatitis B blood test.  Sexually transmitted disease (STD) testing.  Diabetes screening. This is done by checking your blood sugar (glucose) after you have not eaten for a while (fasting). You may have this done every 1-3 years.  Abdominal aortic aneurysm (AAA) screening. You may need this if you are a current or former smoker.  Osteoporosis. You may be screened starting at age 58 if you are at high risk. Talk with your health care provider about your test results, treatment options, and if necessary, the need for more tests. Vaccines  Your health care provider may recommend certain vaccines, such as:  Influenza vaccine. This is recommended  every year.  Tetanus, diphtheria, and acellular pertussis (Tdap, Td) vaccine. You may need a Td booster every 10 years.  Zoster vaccine. You may need this after age 19.  Pneumococcal 13-valent conjugate (PCV13)  vaccine. One dose is recommended after age 57.  Pneumococcal polysaccharide (PPSV23) vaccine. One dose is recommended after age 23. Talk to your health care provider about which screenings and vaccines you need and how often you need them. This information is not intended to replace advice given to you by your health care provider. Make sure you discuss any questions you have with your health care provider. Document Released: 05/22/2015 Document Revised: 01/13/2016 Document Reviewed: 02/24/2015 Elsevier Interactive Patient Education  2017 [place] Prevention in the Home Falls can cause injuries. They can happen to people of all ages. There are many things you can do to make your home safe and to help prevent falls. What can I do on the outside of my home?  Regularly fix the edges of walkways and driveways and fix any cracks.  Remove anything that might make you trip as you walk through a door, such as a raised step or threshold.  Trim any bushes or trees on the path to your home.  Use bright outdoor lighting.  Clear any walking paths of anything that might make someone trip, such as rocks or tools.  Regularly check to see if handrails are loose or broken. Make sure that both sides of any steps have handrails.  Any raised decks and porches should have guardrails on the edges.  Have any leaves, snow, or ice cleared regularly.  Use sand or salt on walking paths during winter.  Clean up any spills in your garage right away. This includes oil or grease spills. What can I do in the bathroom?  Use night lights.  Install grab bars by the toilet and in the tub and shower. Do not use towel bars as grab bars.  Use non-skid mats or decals in the tub or shower.  If you need to sit down in the shower, use a plastic, non-slip stool.  Keep the floor dry. Clean up any water that spills on the floor as soon as it happens.  Remove soap buildup in the tub or shower  regularly.  Attach bath mats securely with double-sided non-slip rug tape.  Do not have throw rugs and other things on the floor that can make you trip. What can I do in the bedroom?  Use night lights.  Make sure that you have a light by your bed that is easy to reach.  Do not use any sheets or blankets that are too big for your bed. They should not hang down onto the floor.  Have a firm chair that has side arms. You can use this for support while you get dressed.  Do not have throw rugs and other things on the floor that can make you trip. What can I do in the kitchen?  Clean up any spills right away.  Avoid walking on wet floors.  Keep items that you use a lot in easy-to-reach places.  If you need to reach something above you, use a strong step stool that has a grab bar.  Keep electrical cords out of the way.  Do not use floor polish or wax that makes floors slippery. If you must use wax, use non-skid floor wax.  Do not have throw rugs and other things on the floor that can make you trip. What  can I do with my stairs?  Do not leave any items on the stairs.  Make sure that there are handrails on both sides of the stairs and use them. Fix handrails that are broken or loose. Make sure that handrails are as long as the stairways.  Check any carpeting to make sure that it is firmly attached to the stairs. Fix any carpet that is loose or worn.  Avoid having throw rugs at the top or bottom of the stairs. If you do have throw rugs, attach them to the floor with carpet tape.  Make sure that you have a light switch at the top of the stairs and the bottom of the stairs. If you do not have them, ask someone to add them for you. What else can I do to help prevent falls?  Wear shoes that:  Do not have high heels.  Have rubber bottoms.  Are comfortable and fit you well.  Are closed at the toe. Do not wear sandals.  If you use a stepladder:  Make sure that it is fully  opened. Do not climb a closed stepladder.  Make sure that both sides of the stepladder are locked into place.  Ask someone to hold it for you, if possible.  Clearly mark and make sure that you can see:  Any grab bars or handrails.  First and last steps.  Where the edge of each step is.  Use tools that help you move around (mobility aids) if they are needed. These include:  Canes.  Walkers.  Scooters.  Crutches.  Turn on the lights when you go into a dark area. Replace any light bulbs as soon as they burn out.  Set up your furniture so you have a clear path. Avoid moving your furniture around.  If any of your floors are uneven, fix them.  If there are any pets around you, be aware of where they are.  Review your medicines with your doctor. Some medicines can make you feel dizzy. This can increase your chance of falling. Ask your doctor what other things that you can do to help prevent falls. This information is not intended to replace advice given to you by your health care provider. Make sure you discuss any questions you have with your health care provider. Document Released: 02/19/2009 Document Revised: 10/01/2015 Document Reviewed: 05/30/2014 Elsevier Interactive Patient Education  2017 [employer].

## 2020-06-09 NOTE — Progress Notes (Signed)
Subjective:   Chris Price is a 80 y.o. male who presents for Medicare Annual/Subsequent preventive examination.  Virtual Visit via Telephone Note  I connected with  Chris Price on 06/09/20 at  8:00 AM EST by telephone and verified that I am speaking with the correct person using two identifiers.  Location: Patient: home Provider: [place] Persons participating in the virtual visit: patient & his wife [employer]   I discussed the limitations, risks, security and privacy concerns of performing an evaluation and management service by telephone and the availability of in person appointments. The patient expressed understanding and agreed to proceed.  Interactive audio and video telecommunications were attempted between this nurse and patient, however failed, due to patient having technical difficulties OR patient did not have access to video capability.  We continued and completed visit with audio only.  Some vital signs may be absent or patient reported.   Clemetine Marker, LPN    Review of Systems     Cardiac Risk Factors include: advanced age (>35men, >3 women);dyslipidemia;hypertension;male gender;obesity (BMI >30kg/m2)     Objective:    Today's Vitals   06/09/20 0805  PainSc: 5    There is no height or weight on file to calculate BMI.  Advanced Directives 05/21/2019 05/15/2018 05/12/2017 03/08/2017 05/12/2016 07/30/2015 05/13/2015  Does Patient Have a Medical Advance Directive? Yes Yes Yes Yes Yes No Yes  Type of Paramedic of [place];Living will Living will;Healthcare Power of [place];Living will [place];Living will Living will - [place];Living will  Does patient want to make changes to medical advance directive? - No - Patient declined No - Patient declined No - Patient declined No - Patient declined - -  Copy of [place] in Chart? Yes - validated  most recent copy scanned in chart (See row information) Yes - validated most recent copy scanned in chart (See row information) Yes Yes - - Yes  Would patient like information on creating a medical advance directive? - - - - - No - patient declined information -    Current Medications (verified) Outpatient Encounter Medications as of 06/09/2020  Medication Sig  . bisoprolol (ZEBETA) 5 MG tablet TAKE 1/2 TABLET BY MOUTH TWICE A DAY  . ezetimibe (ZETIA) 10 MG tablet Take 1 tablet (10 mg total) by mouth daily.  . finasteride (PROSCAR) 5 MG tablet TAKE ONE TABLET BY MOUTH DAILY  . furosemide (LASIX) 20 MG tablet TAKE ONE TABLET BY MOUTH DAILY  . lisinopril (ZESTRIL) 2.5 MG tablet Take 1 tablet (2.5 mg total) by mouth daily.  Marland Kitchen loratadine (CLARITIN) 10 MG tablet TAKE ONE TABLET BY MOUTH DAILY AS NEEDED FOR ALLERGIES  . multivitamin (ONE-A-DAY MEN'S) TABS tablet Take 1 tablet by mouth daily.  . nitroGLYCERIN (NITROSTAT) 0.4 MG SL tablet Place 1 tablet (0.4 mg total) under the tongue every 5 (five) minutes as needed for chest pain.  Marland Kitchen omeprazole (PRILOSEC) 20 MG capsule TAKE 1 CAPSULE 2 TIMES DAILY BEFORE A MEAL. FOR ACID REFLUX. (Patient taking differently: Take 20 mg by mouth 2 (two) times daily as needed (heartburn).)  . rosuvastatin (CRESTOR) 40 MG tablet Take 1 tablet (40 mg total) by mouth daily.  . traZODone (DESYREL) 100 MG tablet Take 1 tablet (100 mg total) by mouth at bedtime.  . triamcinolone ointment (KENALOG) 0.1 % Apply topically.  . vitamin C (ASCORBIC ACID) 500 MG tablet Take 500 mg by mouth daily.  Marland Kitchen  XARELTO 20 MG TABS tablet TAKE ONE TABLET BY MOUTH DAILY WITH SUPPER   No facility-administered encounter medications on file as of 06/09/2020.    Allergies (verified) Coreg [carvedilol] and Flomax [tamsulosin hcl]   History: Past Medical History:  Diagnosis Date  . Atrial fibrillation ([place])   . Cardiomyopathy -nonischemic    moderate to severe EF 25-30% March 2013  . CHF  (congestive heart failure) ([place])   . Chronic systolic heart failure ([place]) 08/25/2011  . Coronary artery disease 08/15/2011   Moderate nonobstructive CAD with 60% mid LAD, 60% proximal RCA and 30 % stenossis left circumflex. Cardiac catheterization in March of 2015 showed no significant change   . Degenerative disk disease    lumbar  . Disc herniation   . GERD (gastroesophageal reflux disease)   . Hyperlipidemia   . Hypertension   . ICD (implantable cardiac defibrillator) in place   . IVCD (intraventricular conduction defect)    Nonspecific QRS duration 122  . Lumbar spinal stenosis   . 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 Persistent atrial fibrillation ([place]) 11/07/2011  . Sleep apnea    uses cpap   Past Surgical History:  Procedure Laterality Date  . CARDIAC [facility]IZATION  [facility]   . CARDIAC CATHETERIZATION  07/09/2013   [facility]  . CARDIOVERSION  08/2011   [facility]  . CHOLECYSTECTOMY  April 2015  . COLONOSCOPY WITH PROPOFOL N/A 07/30/2015   Procedure: COLONOSCOPY WITH PROPOFOL;  Surgeon: Lollie Sails, MD;  Location: [facility] ENDOSCOPY;  Service: Endoscopy;  Laterality: N/A;  . cooled thermotherapy  [id number]   [place], complicated by incontinence  . HERNIA REPAIR    . ICD  09/08/2011  . ICD GENERATOR CHANGEOUT N/A 03/08/2017   Procedure: ICD GENERATOR CHANGEOUT;  Surgeon: [name], MD;  Location: [place] CV LAB;  Service: Cardiovascular;  Laterality: N/A;  . IMPLANTABLE CARDIOVERTER DEFIBRILLATOR IMPLANT  09/08/2011   Procedure: IMPLANTABLE CARDIOVERTER DEFIBRILLATOR IMPLANT;  Surgeon: [name], MD;  [facility];  Service: Cardiovascular;;  . LUMBAR LAMINECTOMY     L2 through S1 with wide decompression of the thecal sac and nerve roots.  Marland Kitchen PROSTATE BIOPSY  2013   Family History  Problem Relation Age of Onset  . Heart attack Father   . Heart attack Brother   . Heart attack Son   . Ovarian cancer Daughter    Social History    Socioeconomic History  . Marital status: Married    Spouse name: Not on file  . Number of children: 3  . Years of education: Not on file  . Highest education level: 12th grade  Occupational History  . 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 Smoking status: Never Smoker  . Smokeless tobacco: Never Used  Vaping Use  . Vaping Use: Never used  Substance and Sexual Activity  . Alcohol use: No  . Drug use: No  . Sexual activity: Not Currently  Other Topics Concern  . Not on file  Social History Narrative   Second marriage, together since 68   He has 3 children from previous marriage and one step-daughter    Social Determinants of Health   Financial Resource Strain: Low Risk   . Difficulty of Paying Living Expenses: Not hard at all  Food Insecurity: No Food Insecurity  . Worried About Charity fundraiser in the Last Year: Never true  .  Ran Out of Food in the Last Year: Never true  Transportation Needs: No Transportation Needs  . Lack of Transportation (Medical): No  . Lack of Transportation (Non-Medical): No  Physical Activity: Sufficiently Active  . Days of Exercise per Week: 3 days  . Minutes of Exercise per Session: 60 min  Stress: No Stress Concern Present  . Feeling of Stress : Not at all  Social Connections: Moderately Isolated  . Frequency of Communication with Friends and Family: More than three times a week  . Frequency of Social Gatherings with Friends and Family: Three times a week  . Attends Religious Services: Never  . Active Member of Clubs or Organizations: No  . Attends Archivist Meetings: Never  . Marital Status: Married    Tobacco Counseling Counseling given: Not Answered   Clinical Intake:  Pre-visit preparation completed: Yes  Pain : 0-10 Pain Score: 5  Pain Type: Chronic pain Pain Location: Hip (back) Pain Orientation: Lower,Left Pain Descriptors / Indicators: Aching,Sore Pain Onset: More than a  month ago Pain Frequency: Constant     Nutritional Risks: None Diabetes: No  How often do you need to have someone help you when you read instructions, pamphlets, or other written materials from your doctor or pharmacy?: 1 - Never    Interpreter Needed?: No  Information entered by :: Clemetine Marker LPN   Activities of Daily Living In your present state of health, do you have any difficulty performing the following activities: 06/09/2020 01/31/2020  Hearing? Y Y  Comment pt wears hearing aids -  Vision? N Y  Difficulty concentrating or making decisions? Y N  Walking or climbing stairs? N N  Dressing or bathing? N N  Doing errands, shopping? N N  Preparing Food and eating ? N -  Using the Toilet? N -  In the past six months, have you accidently leaked urine? N -  Do you have problems with loss of bowel control? N -  Managing your Medications? N -  Managing your Finances? N -  Housekeeping or managing your Housekeeping? N -  Some recent data might be hidden    Patient Care Team: [name], MD as PCP - General (Family Medicine) [name], MD as Consulting Physician (Cardiology) [name], MD as Consulting Physician (Urology)  Indicate any recent Medical Services you may have received from other than [facility] providers in the past year (date may be approximate).     Assessment:   This is a routine wellness examination for Chris Price.  Hearing/Vision screen  Hearing Screening   125Hz  250Hz  500Hz  1000Hz  2000Hz  3000Hz  4000Hz  6000Hz  8000Hz   Right ear:           Left ear:           Comments: Pt wears hearing aids by [employer] in [place]  Vision Screening Comments: [facility] screenings done at [facility]  Dietary issues and exercise activities discussed: Current Exercise Habits: Home exercise routine, Type of exercise: calisthenics;stretching, Time (Minutes): 60, Frequency (Times/Week): 3, Weekly Exercise (Minutes/Week): 180, Intensity: Mild,  Exercise limited by: orthopedic condition(s)  Goals    . DIET - INCREASE WATER INTAKE    . Increase physical activity     Stretch!! Chair exercises as demonstrated, increase as tolerated Silver sneaker water aerobics 3 times weekly, 45-60 minutes       Depression Screen PHQ 2/9 Scores 06/09/2020 01/31/2020 07/31/2019 05/21/2019 01/25/2019 07/17/2018 05/15/2018  PHQ - 2 Score 0 0 0 0 0 0 0  PHQ- 9 Score - - 0 - 0 0 -    Fall Risk Fall Risk  06/09/2020 01/31/2020 07/31/2019 05/21/2019 01/25/2019  Falls in the past year? 0 0 0 0 1  Number falls in past yr: 0 0 0 0 1  Injury with Fall? 0 0 0 0 1  Risk for fall due to : No Fall Risks - - - -  Follow up Falls prevention discussed - - Falls prevention discussed -    FALL RISK PREVENTION PERTAINING TO THE HOME:  Any stairs in or around the home? Yes  If so, are there any without handrails? No  Home free of loose throw rugs in walkways, pet beds, electrical cords, etc? Yes  Adequate lighting in your home to reduce risk of falls? Yes   ASSISTIVE DEVICES UTILIZED TO PREVENT FALLS:  Life alert? No  Use of a cane, walker or w/c? No  Grab bars in the bathroom? Yes  Shower chair or bench in shower? No  Elevated toilet seat or a handicapped toilet? No   TIMED UP AND GO:  Was the test performed? No . Telephonic visit.   Cognitive Function: MMSE - Mini Mental State Exam 05/12/2016 05/13/2015  Orientation to time 5 5  Orientation to Place 5 5  Registration 3 3  Attention/ Calculation 5 5  Recall 3 3  Language- name 2 objects 2 2  Language- repeat 1 1  Language- follow 3 step command 3 3  Language- read & follow direction 1 1  Write a sentence 1 1  Copy design 1 1  Total score 30 30     6CIT Screen 06/09/2020 05/21/2019 05/15/2018 05/12/2017  What Year? 0 points 0 points 0 points 0 points  What month? 0 points 0 points 0 points 0 points  What time? 0 points 0 points 0 points 0 points  Count back from 20 0 points 0 points 0 points 0 points  Months  in reverse 0 points 0 points 2 points 0 points  Repeat phrase 6 points 4 points 2 points 0 points  Total Score 6 4 4  0    Immunizations Immunization History  Administered Date(s) Administered  . Fluad Quad(high Dose 65+) 01/25/2019, 01/31/2020  . Influenza Split 02/19/2012  . Influenza-Unspecified 02/08/2013, 02/09/2016, 02/20/2017, 02/02/2018  . PFIZER(Purple Top)SARS-COV-2 Vaccination 06/03/2019, 06/24/2019  . Pneumococcal Conjugate-13 05/14/2015  . Pneumococcal Polysaccharide-23 03/21/2012  . Tdap 02/28/2011, 03/28/2011  . Zoster Recombinat (Shingrix) 10/29/2019, 12/31/2019    TDAP status: Up to date  Flu Vaccine status: Up to date  Pneumococcal vaccine status: Up to date  Covid-19 vaccine status: Completed vaccines  Qualifies for Shingles Vaccine? Yes   Zostavax completed No   Shingrix Completed?: Yes  Screening Tests Health Maintenance  Topic Date Due  . COVID-19 Vaccine (3 - Booster for Pfizer series) 12/22/2019  . TETANUS/TDAP  03/27/2021  . INFLUENZA VACCINE  Completed  . PNA vac Low Risk Adult  Completed    Health Maintenance  Health Maintenance Due  Topic Date Due  . COVID-19 Vaccine (3 - Booster for Pfizer series) 12/22/2019    Colorectal cancer screening: No longer required.   Lung Cancer Screening: (Low Dose CT Chest recommended if Age 40-80 years, 30 pack-year currently smoking OR have quit w/in 15years.) does not qualify.   Additional Screening:  Hepatitis C Screening: does not qualify  Vision Screening: Recommended annual ophthalmology exams for early detection of glaucoma and other disorders of the eye. Is the patient up to date  with their annual eye exam?  Yes  Who is the provider or what is the name of the office in which the patient attends annual eye exams? [place] Screening: Recommended annual dental exams for proper oral hygiene  Community Resource Referral / Chronic Care Management: CRR required this visit?  No    CCM required this visit?  No      Plan:     I have personally reviewed and noted the following in the patient's chart:   . Medical and social history . Use of alcohol, tobacco or illicit drugs  . Current medications and supplements . Functional ability and status . Nutritional status . Physical activity . Advanced directives . List of other physicians . Hospitalizations, surgeries, and ER visits in previous 12 months . Vitals . Screenings to include cognitive, depression, and falls . Referrals and appointments  In addition, I have reviewed and discussed with patient certain preventive protocols, quality metrics, and best practice recommendations. A written personalized care plan for preventive services as well as general preventive health recommendations were provided to patient.     Clemetine Marker, LPN   [id number]   Nurse Notes: pt c/o hands shaking and states this has been an ongoing issue. Pt recalled initial referral to neurology with telehealth visit with Dr. [name] 08/21/18. He was also seen by Gayland Curry PA 12/03/18. Pt advised no evidence of parkinsons. Pt seen for second opinion by Dr. Rexene Alberts at [facility] with diagnosis of esesntial tremor and no evidence of parkinsons and no need for follow up with neuro. He is still concerned about tremors and how bothersome they have become making it difficulty for him to complete certain activities such as using a screwdriver. Pt advised to discuss with Dr. [name] at follow up visit in March,

## 2020-06-13 NOTE — Progress Notes (Signed)
Remote ICD transmission.   

## 2020-07-03 DIAGNOSIS — G4733 Obstructive sleep apnea (adult) (pediatric): Secondary | ICD-10-CM | POA: Diagnosis not present

## 2020-07-27 NOTE — Progress Notes (Signed)
Name: Chris Price   MRN: [id number]    DOB: 1941-03-17   Date:07/30/2020       Progress Note  Subjective  Chief Complaint  Follow Up  HPI  Chronic afib: he sees Dr. Jens Som, no palpitation or SOB, he has a pacemaker,he is on beta-blocker and back on lisinopril   Chronic bronchitis: : he never smoked, second hand smoking as a child - both parents smoked - He has chronic cough, he has has a productive cough , usually clear to white,  no wheezing, he has sob during activity but stable and likely multifactorial   CHF :He has mild lower extremity edema.  He sees cardiologist, Dr. Caryl Comes but not seen in a while. 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   Pre-diabetes: last hgbA1Cwas slightly lower at 5.7 % , he likes ice cream but only twice a week, denies polyphagia, polydipsia or polyuria, we will recheck labs today   HTN:bp at home between has been well controlled around 120/80's  denies  dizziness, denies chest pain or palpitation, today bp slightly elevated and we will monitor for now.   Paresthesia both feet: going on since 2007 after back surgery - Dr. Rennis Harding, but getting progressively worse.Last b12 level was normal, discussed gabapentin but he does not want it at this time, since no pain, he only has  Numbness. Unchanged   Insomnia:heis currently taking Trazodone 100 mg and melatonin  and is working well for him, he is wearing his CPAP , he goes to bed late, and wakes up once  at night, but able to fall back asleep ,he wants to continue medications.   Tremors hands: going on for years, but worse since 2019 ,seen at [facility] , he had a second opinion done at [facility] neurology. Reviewed the notes . No symptoms of Parkinson's advised to continue bizoprolol and advised to avoid [place] since it can cause more side effects We can try to increase dose of bisoprolol   Hearing loss: he sees ENT and is now wearing a hearing aid   BPH with LUTS: seeing Urologist , seeing Dr. Erlene Quan yearly now and is doing well , nocturia once per night  Unchanged   Senile purpura: both arms, reassurance given . Unchanged   Morbid obesity: BMI above 35 with co-morbidities, continue regular physical activity and balanced diet He seems to be eating out more often than usual    Patient Active Problem List   Diagnosis Date Noted  . Senile purpura ([place]) 01/31/2020  . Primary insomnia 01/31/2020  . Hearing loss, sensorineural 09/12/2017  . Prediabetes 07/01/2017  . Hx of colonic polyps 06/19/2016  . Long-term use of high-risk medication 06/19/2016  . Cardiomyopathy ([place]) 07/14/2015  . Low back pain due to displacement of intervertebral disc 04/09/2015  . Diarrhea following gastrointestinal surgery 09/04/2013  . S/P laparoscopic cholecystectomy 08/20/2013  . Chronic atrial fibrillation ([place]) 07/15/2013  . Morbid obesity ([place]) 02/28/2013  . OSA on  CPAP 01/03/2013  . Hyperlipidemia with target low density lipoprotein (LDL) cholesterol less than 70 mg/dL 03/22/2012  . ED (erectile dysfunction) of organic origin 01/23/2012  . Benign localized prostatic hyperplasia with lower urinary tract symptoms (LUTS) 01/23/2012  . Bladder outlet obstruction 12/25/2011  . Essential tremor 12/22/2011  . Hypertension   . Elevated PSA, less than 10 ng/ml 09/20/2011  . Automatic implantable cardioverter-defibrillator Medtronic dual-chamber 09/09/2011  . Chronic systolic heart failure ([place])  08/25/2011  . IVCD (intraventricular conduction defect)   . Coronary artery disease 08/15/2011    Past Surgical History:  Procedure Laterality Date  . CARDI[facility]  [facility]   . CARDIAC CATHETERIZATION  07/09/2013   [facility]  . CARDIOVERSION  08/2011   [facility]  . CHOLECYSTECTOMY  April 2015  . COLONOSCOPY WITH PROPOFOL N/A 07/30/2015   Procedure: COLONOSCOPY WITH PROPOFOL;  Surgeon: Lollie Sails, MD;  Location: [facility] ENDOSCOPY;  Service: Endoscopy;  Laterality: N/A;  . cooled thermotherapy  2831   [place], complicated by incontinence  . HERNIA REPAIR    . ICD  09/08/2011  . ICD GENERATOR CHANGEOUT N/A 03/08/2017   Procedure: ICD GENERATOR CHANGEOUT;  Surgeon: [name], MD;  Location: [facility] CV LAB;  Service: Cardiovascular;  Laterality: N/A;  . IMPLANTABLE CARDIOVERTER DEFIBRILLATOR IMPLANT  09/08/2011   Procedure: IMPLANTABLE CARDIOVERTER DEFIBRILLATOR IMPLANT;  Surgeon: [name], MD;  Location: [facility] CATH LAB;  Service: Cardiovascular;;  . LUMBAR LAMINECTOMY     L2 through S1 with wide decompression of the thecal sac and nerve roots.  Marland Kitchen PROSTATE BIOPSY  2013    Family History  Problem Relation Age of Onset  . Heart attack Father   . Heart attack Brother   . Heart attack Son   . Ovarian cancer Daughter     Social History   Tobacco Use  . Smoking status: Never Smoker  . Smokeless tobacco: Never Used  Substance Use Topics  . Alcohol use: No     Current Outpatient Medications:  .  bisoprolol (ZEBETA) 5 MG tablet, TAKE 1/2 TABLET BY MOUTH TWICE A DAY, Disp: 90 tablet, Rfl: 1 .  ezetimibe (ZETIA) 10 MG tablet, Take 1 tablet (10 mg total) by mouth daily., Disp: 90 tablet, Rfl: 1 .  finasteride (PROSCAR) 5 MG tablet, TAKE ONE TABLET BY MOUTH DAILY, Disp: 90 tablet, Rfl: 3 .  furosemide (LASIX) 20 MG tablet, TAKE ONE TABLET BY MOUTH DAILY, Disp: 90 tablet, Rfl: 1 .  lisinopril (ZESTRIL) 2.5 MG tablet, Take 1 tablet (2.5 mg total) by mouth daily.,  Disp: 90 tablet, Rfl: 1 .  loratadine (CLARITIN) 10 MG tablet, TAKE ONE TABLET BY MOUTH DAILY AS NEEDED FOR ALLERGIES, Disp: 90 tablet, Rfl: 1 .  multivitamin (ONE-A-DAY MEN'S) TABS tablet, Take 1 tablet by mouth daily., Disp: , Rfl:  .  nitroGLYCERIN (NITROSTAT) 0.4 MG SL tablet, Place 1 tablet (0.4 mg total) under the tongue every 5 (five) minutes as needed for chest pain., Disp: 25 tablet, Rfl: 3 .  omeprazole (PRILOSEC) 20 MG capsule, TAKE 1 CAPSULE 2 TIMES DAILY BEFORE A MEAL. FOR ACID REFLUX. (Patient taking differently: Take 20 mg by mouth 2 (two) times daily as needed (heartburn).), Disp: 60 capsule, Rfl: 5 .  rosuvastatin (CRESTOR) 40 MG tablet, Take 1 tablet (40 mg total) by mouth daily., Disp: 90 tablet, Rfl: 1 .  traZODone (DESYREL) 100 MG tablet, Take 1 tablet (100 mg total) by mouth at bedtime., Disp: 90 tablet, Rfl: 1 .  triamcinolone ointment (KENALOG) 0.1 %, Apply topically., Disp: , Rfl:  .  vitamin C (ASCORBIC ACID) 500 MG tablet, Take 500 mg by mouth daily., Disp: , Rfl:  .  XARELTO 20 MG TABS tablet, TAKE ONE TABLET BY MOUTH DAILY WITH SUPPER, Disp: 90 tablet, Rfl: 1  Allergies  Allergen Reactions  . Coreg [Carvedilol] Other (See Comments)    hypotension  . Flomax [Tamsulosin Hcl] Other (See Comments)    Hypotension    I personally reviewed active problem list, medication list, allergies, family history, social history, health maintenance with the patient/caregiver today.   ROS  Constitutional: Negative for fever or weight change.  Respiratory: Negative for cough and shortness of breath.   Cardiovascular: Negative for chest pain or palpitations.  Gastrointestinal: Negative for abdominal pain, no bowel changes.  Musculoskeletal: Negative for gait problem or joint swelling.  Skin: Negative for rash.  Neurological: Negative for dizziness or headache.  No other specific complaints in a complete review of systems (except as listed in HPI above).  Objective  Vitals:    07/30/20 0925 07/30/20 0931  BP: (!) 144/100 (!) 142/88  Pulse: 65   Resp: 16   Temp: 98 F (36.7 C)   TempSrc: Oral   SpO2: 95%   Weight: 219 lb (99.3 kg)   Height: 5\' 6"  (1.676 m)     Body mass index is 35.35 kg/m.  Physical Exam  Constitutional: Patient appears well-developed and well-nourished. Obese  No distress.  HEENT: head atraumatic, normocephalic, pupils equal and reactive to light,  neck supple Cardiovascular: Normal rate, regular rhythm and normal heart sounds.  No murmur heard. No BLE edema. Pulmonary/Chest: Effort normal and breath sounds normal. No respiratory distress. Skin: senile purpura  Abdominal: Soft.  There is no tenderness. Psychiatric: Patient has a normal mood and affect. behavior is normal. Judgment and thought content normal.  Recent Results (from the past 2160 hour(s))  CUP PACEART REMOTE DEVICE CHECK     Status: None   Collection Time: 06/02/20  4:38 AM  Result Value Ref Range   Date Time Interrogation Session [id number]    Pulse Generator Manufacturer MERM    Pulse Gen Model [id number] Evera MRI XT DR    Pulse Gen Serial Number [id number]    Clinic Name [facility]    Implantable Pulse Generator Type Implantable Cardiac Defibulator    Implantable Pulse Generator Implant Date [id number]    Implantable Lead Manufacturer [facility]    Implantable Lead Model 5076 CapSureFix Novus    Implantable Lead Serial Number [id number]    Implantable Lead Implant Date [id number]    Implantable Lead Location Detail 1 APPENDAGE    Implantable Lead Location [id number]    Implantable Lead Manufacturer [facility]    Implantable Lead Model 6935 Sprint Quattro Secure S    Implantable Lead Serial Number [id number]    Implantable Lead Implant Date [id number]    Implantable Lead Location Detail 1 APEX    Implantable Lead Location [id number]    Lead Channel Setting Sensing Sensitivity 0.3 mV   Lead Channel Setting Pacing Pulse Width 0.4 ms   Lead Channel Setting Pacing Amplitude  2.5 V   Lead Channel Impedance Value 494 ohm   Lead Channel Impedance Value 551 ohm   Lead Channel Impedance Value 380 ohm   Lead Channel Sensing Intrinsic Amplitude 6.75 mV   Lead Channel Sensing Intrinsic Amplitude 6.75 mV   Lead Channel Pacing Threshold Amplitude 0.625 V   Lead Channel Pacing Threshold Pulse Width 0.4  ms   HighPow Impedance 73 ohm   Battery Status OK    Battery Remaining Longevity 95 mo   Battery Voltage 3.00 V   Brady Statistic RA Percent Paced 0 %   Brady Statistic RV Percent Paced 3.66 %   Brady Statistic AP VP Percent 0 %   Brady Statistic AS VP Percent 4.13 %   [employer] AP VS Percent 0 %   Brady Statistic AS VS Percent 95.87 %      PHQ2/9: Depression screen [facility] 2/9 07/30/2020 06/09/2020 01/31/2020 07/31/2019 05/21/2019  Decreased Interest 0 0 0 0 0  Down, Depressed, Hopeless 0 0 0 0 0  PHQ - 2 Score 0 0 0 0 0  Altered sleeping - - - 0 -  Tired, decreased energy - - - 0 -  Change in appetite - - - 0 -  Feeling bad or failure about yourself  - - - 0 -  Trouble concentrating - - - 0 -  Moving slowly or fidgety/restless - - - 0 -  Suicidal thoughts - - - 0 -  PHQ-9 Score - - - 0 -  Difficult doing work/chores - - - - -    phq 9 is negative   Fall Risk: Fall Risk  07/30/2020 06/09/2020 01/31/2020 07/31/2019 05/21/2019  Falls in the past year? 0 0 0 0 0  Number falls in past yr: 0 0 0 0 0  Injury with Fall? 0 0 0 0 0  Risk for fall due to : - No Fall Risks - - -  Follow up - Falls prevention discussed - - Falls prevention discussed     Functional Status Survey: Is the patient deaf or have difficulty hearing?: Yes Does the patient have difficulty seeing, even when wearing glasses/contacts?: No Does the patient have difficulty concentrating, remembering, or making decisions?: Yes Does the patient have difficulty walking or climbing stairs?: Yes Does the patient have difficulty dressing or bathing?: No Does the patient have difficulty doing errands  alone such as visiting a doctor's office or shopping?: No    Assessment & Plan  1. Dyslipidemia  - ezetimibe (ZETIA) 10 MG tablet; Take 1 tablet (10 mg total) by mouth daily.  Dispense: 90 tablet; Refill: 1 - rosuvastatin (CRESTOR) 40 MG tablet; Take 1 tablet (40 mg total) by mouth daily.  Dispense: 90 tablet; Refill: 1  2. Primary insomnia  - traZODone (DESYREL) 100 MG tablet; Take 1 tablet (100 mg total) by mouth at bedtime.  Dispense: 90 tablet; Refill: 1  3. Other cardiomyopathy ([place])  - Ambulatory referral to Cardiology  4. OSA on CPAP   5. Chronic atrial fibrillation ([place])  - Ambulatory referral to Cardiology  6. Chronic systolic heart failure ([place])  - Ambulatory referral to Cardiology  7. Morbid obesity ([place])  Discussed with the patient the risk posed by an increased BMI. Discussed importance of portion control, calorie counting and at least 150 minutes of physical activity weekly. Avoid sweet beverages and drink more water. Eat at least 6 servings of fruit and vegetables daily   8. Senile purpura ([place])   9. Chronic left-sided low back pain without sciatica   10. Essential hypertension  Slightly elevated  11. Essential tremor  Increase dose of bisoprolol    12. Gastroesophageal reflux disease without esophagitis  Resume omeprazole   13. Simple chronic bronchitis ([place])  He does not want medications

## 2020-07-30 ENCOUNTER — Encounter: Payer: Self-pay | Admitting: Family Medicine

## 2020-07-30 ENCOUNTER — Ambulatory Visit (INDEPENDENT_AMBULATORY_CARE_PROVIDER_SITE_OTHER): Payer: PPO | Admitting: Family Medicine

## 2020-07-30 ENCOUNTER — Other Ambulatory Visit: Payer: Self-pay

## 2020-07-30 VITALS — BP 142/88 | HR 65 | Temp 98.0°F | Resp 16 | Ht 66.0 in | Wt 219.0 lb

## 2020-07-30 DIAGNOSIS — I10 Essential (primary) hypertension: Secondary | ICD-10-CM

## 2020-07-30 DIAGNOSIS — I5022 Chronic systolic (congestive) heart failure: Secondary | ICD-10-CM

## 2020-07-30 DIAGNOSIS — F5101 Primary insomnia: Secondary | ICD-10-CM | POA: Diagnosis not present

## 2020-07-30 DIAGNOSIS — M545 Low back pain, unspecified: Secondary | ICD-10-CM | POA: Diagnosis not present

## 2020-07-30 DIAGNOSIS — D692 Other nonthrombocytopenic purpura: Secondary | ICD-10-CM

## 2020-07-30 DIAGNOSIS — E785 Hyperlipidemia, unspecified: Secondary | ICD-10-CM

## 2020-07-30 DIAGNOSIS — I482 Chronic atrial fibrillation, unspecified: Secondary | ICD-10-CM | POA: Diagnosis not present

## 2020-07-30 DIAGNOSIS — G250 Essential tremor: Secondary | ICD-10-CM | POA: Diagnosis not present

## 2020-07-30 DIAGNOSIS — I428 Other cardiomyopathies: Secondary | ICD-10-CM | POA: Diagnosis not present

## 2020-07-30 DIAGNOSIS — G8929 Other chronic pain: Secondary | ICD-10-CM

## 2020-07-30 DIAGNOSIS — G4733 Obstructive sleep apnea (adult) (pediatric): Secondary | ICD-10-CM | POA: Diagnosis not present

## 2020-07-30 DIAGNOSIS — K219 Gastro-esophageal reflux disease without esophagitis: Secondary | ICD-10-CM

## 2020-07-30 DIAGNOSIS — J410 Simple chronic bronchitis: Secondary | ICD-10-CM

## 2020-07-30 DIAGNOSIS — Z9989 Dependence on other enabling machines and devices: Secondary | ICD-10-CM

## 2020-07-30 MED ORDER — BISOPROLOL FUMARATE 5 MG PO TABS: 5.0000 mg | ORAL_TABLET | Freq: Two times a day (BID) | ORAL | 0 refills | Status: DC

## 2020-07-30 MED ORDER — ROSUVASTATIN CALCIUM 40 MG PO TABS: 40.0000 mg | ORAL_TABLET | Freq: Every day | ORAL | 1 refills | Status: DC

## 2020-07-30 MED ORDER — OMEPRAZOLE 40 MG PO CPDR: 40.0000 mg | DELAYED_RELEASE_CAPSULE | Freq: Every day | ORAL | 1 refills | Status: AC

## 2020-07-30 MED ORDER — EZETIMIBE 10 MG PO TABS: 10.0000 mg | ORAL_TABLET | Freq: Every day | ORAL | 1 refills | Status: DC

## 2020-07-30 MED ORDER — TRAZODONE HCL 100 MG PO TABS: 100.0000 mg | ORAL_TABLET | Freq: Every day | ORAL | 1 refills | Status: DC

## 2020-07-30 NOTE — Patient Instructions (Signed)
BP is a little elevated and tremors worse, I am increasing dose of bisoprolol from 2.5 mg to 5 mg twice daily to see if it will help with symptoms. Advised to monitor bp

## 2020-08-17 ENCOUNTER — Other Ambulatory Visit: Payer: Self-pay

## 2020-08-17 ENCOUNTER — Encounter: Payer: Self-pay | Admitting: Internal Medicine

## 2020-08-17 ENCOUNTER — Ambulatory Visit: Payer: PPO | Admitting: Internal Medicine

## 2020-08-17 VITALS — BP 130/80 | HR 65 | Ht 66.0 in | Wt 220.0 lb

## 2020-08-17 DIAGNOSIS — I482 Chronic atrial fibrillation, unspecified: Secondary | ICD-10-CM

## 2020-08-17 DIAGNOSIS — R079 Chest pain, unspecified: Secondary | ICD-10-CM

## 2020-08-17 DIAGNOSIS — I5022 Chronic systolic (congestive) heart failure: Secondary | ICD-10-CM | POA: Diagnosis not present

## 2020-08-17 DIAGNOSIS — Z95810 Presence of automatic (implantable) cardiac defibrillator: Secondary | ICD-10-CM

## 2020-08-17 DIAGNOSIS — I428 Other cardiomyopathies: Secondary | ICD-10-CM | POA: Diagnosis not present

## 2020-08-17 MED ORDER — FUROSEMIDE 20 MG PO TABS: 20.0000 mg | ORAL_TABLET | Freq: Every day | ORAL | 1 refills | Status: DC

## 2020-08-17 NOTE — Patient Instructions (Addendum)
Medication Instructions:  Your physician has recommended you make the following change in your medication:   Take your Lasix as discussed by Dr Caryl Comes for 3 cycles   *If you need a refill on your cardiac medications before your next appointment, please call your pharmacy*   Lab Work: None ordered.  If you have labs (blood work) drawn today and your tests are completely normal, you will receive your results only by: Marland Kitchen MyChart Message (if you have MyChart) OR . A paper copy in the mail If you have any lab test that is abnormal or we need to change your treatment, we will call you to review the results.   Testing/Procedures: Your physician has requested that you have an echocardiogram. Echocardiography is a painless test that uses sound waves to create images of your heart. It provides your doctor with information about the size and shape of your heart and how well your heart's chambers and valves are working. This procedure takes approximately one hour. There are no restrictions for this procedure.  Your physician has requested that you have a lexiscan myoview. For further information please visit [email or web address]. Please follow instruction sheet, as given.      Follow-Up: At [facility], you and your health needs are our priority.  As part of our continuing mission to provide you with exceptional heart care, we have created designated Provider Care Teams.  These Care Teams include your primary Cardiologist (physician) and Advanced Practice Providers (APPs -  Physician Assistants and Nurse Practitioners) who all work together to provide you with the care you need, when you need it.  We recommend signing up for the patient portal called "MyChart".  Sign up information is provided on this After Visit Summary.  MyChart is used to connect with patients for Virtual Visits (Telemedicine).  Patients are able to view lab/test results, encounter notes, upcoming appointments, etc.  Non-urgent  messages can be sent to your provider as well.   To learn more about what you can do with MyChart, go to [email or web address].    Your next appointment:   You are scheduled to follow up in [place] office in August.

## 2020-08-17 NOTE — Progress Notes (Signed)
Patient Care Team: [name], MD as PCP - General (Family Medicine) [name], MD as Consulting Physician (Cardiology) [name], MD as Consulting Physician (Urology)   HPI  Chris Price is a 80 y.o. male Seen in followup for an ICD implanted for primary prevention in nonischemic cardiomyopathy further complicated by sinus bradycardia/ brady -date of implant May 2013.  He underwent device generator replacement 10/18.   History of atrial fibrillation for which he was managed with dabigitran and amiodarone. He developed a significant intention tremor prompting the down titration and subsequent discontinuation of his amiodarone No change in his tremor  Atrial fibrillation is now permanent.    At last visit, add bisoprolol for HR  With improvement   The patient denies, nocturnal dyspnea, orthopnea.  There have been no palpitations, lightheadedness or syncope.   He has had moderate dyspnea on exertion.  He sleeps with a CPAP which is monitored.  Mild edema some bendopnea.  Had a prolonged episode of chest discomfort a couple of days ago unrelated to exertion.  2019 low blood pressure associated with dizziness prompted the discontinuation of spironolactone and changing of his carvedilol--bisoprolol and a down titration of his lisinopril  Our last visit anticipated an echo which we did not do.  DATE Test    3/15 Cath   EF 35 % Nonobstructive disease               Date Cr K Hgb  10/18 0.95 4.9    3/20 0.96 4.5 14.8  3/21 1.03 4.3 14.9  11/21 1.0 4.2 15.6   .   Past Medical History:  Diagnosis Date  . Atrial fibrillation ([place])   . Cardiomyopathy -nonischemic    moderate to severe EF 25-30% March 2013  . CHF (congestive heart failure) ([place])   . Chronic systolic heart failure ([place]) 08/25/2011  . Coronary artery disease 08/15/2011   Moderate nonobstructive CAD with 60% mid LAD, 60% proximal RCA and 30 % stenossis left circumflex. Cardiac catheterization in March  of 2015 showed no significant change   . Degenerative disk disease    lumbar  . Disc herniation   . GERD (gastroesophageal reflux disease)   . Hyperlipidemia   . Hypertension   . ICD (implantable cardiac defibrillator) in place   . IVCD (intraventricular conduction defect)    Nonspecific QRS duration 122  . Lumbar spinal stenosis   . Neuromuscular disorder ([place])    neauropathy   / siatica  . Persistent atrial fibrillation ([place]) 11/07/2011  . Sleep apnea    uses cpap    Past Surgical History:  Procedure Laterality Date  . C[facility]  [facility]   . CARDIAC CATHETERIZATION  07/09/2013   [facility]  . CARDIOVERSION  08/2011   [facility]  . CHOLECYSTECTOMY  April 2015  . COLONOSCOPY WITH PROPOFOL N/A 07/30/2015   Procedure: COLONOSCOPY WITH PROPOFOL;  Surgeon: Lollie Sails, MD;  Location: [facility] ENDOSCOPY;  Service: Endoscopy;  Laterality: N/A;  . cooled thermotherapy  0539   [place], complicated by incontinence  . HERNIA REPAIR    . ICD  09/08/2011  . ICD GENERATOR CHANGEOUT N/A 03/08/2017   Procedure: ICD GENERATOR CHANGEOUT;  Surgeon: [name], MD;  Location: [place] CV LAB;  Service: Cardiovascular;  Laterality: N/A;  . IMPLANTABLE CARDIOVERTER DEFIBRILLATOR IMPLANT  09/08/2011   Procedure: IMPLANTABLE CARDIOVERTER DEFIBRILLATOR IMPLANT;  Surgeon: [name], MD;  Location: [facility] CATH LAB;  Service: Cardiovascular;;  . LUMBAR LAMINECTOMY  L2 through S1 with wide decompression of the thecal sac and nerve roots.  Marland Kitchen PROSTATE BIOPSY  2013    Current Outpatient Medications  Medication Sig Dispense Refill  . bisoprolol (ZEBETA) 5 MG tablet Take 1 tablet (5 mg total) by mouth 2 (two) times daily. 180 tablet 0  . ezetimibe (ZETIA) 10 MG tablet Take 1 tablet (10 mg total) by mouth daily. 90 tablet 1  . finasteride (PROSCAR) 5 MG tablet TAKE ONE TABLET BY MOUTH DAILY 90 tablet 3  . furosemide (LASIX) 20 MG tablet TAKE ONE TABLET BY MOUTH DAILY 90 tablet 1  .  lisinopril (ZESTRIL) 2.5 MG tablet Take 1 tablet (2.5 mg total) by mouth daily. 90 tablet 1  . loratadine (CLARITIN) 10 MG tablet TAKE ONE TABLET BY MOUTH DAILY AS NEEDED FOR ALLERGIES 90 tablet 1  . multivitamin (ONE-A-DAY MEN'S) TABS tablet Take 1 tablet by mouth daily.    . nitroGLYCERIN (NITROSTAT) 0.4 MG SL tablet Place 1 tablet (0.4 mg total) under the tongue every 5 (five) minutes as needed for chest pain. 25 tablet 3  . omeprazole (PRILOSEC) 40 MG capsule Take 1 capsule (40 mg total) by mouth daily before breakfast. 90 capsule 1  . rosuvastatin (CRESTOR) 40 MG tablet Take 1 tablet (40 mg total) by mouth daily. 90 tablet 1  . traZODone (DESYREL) 100 MG tablet Take 1 tablet (100 mg total) by mouth at bedtime. 90 tablet 1  . triamcinolone ointment (KENALOG) 0.1 % Apply topically.    . vitamin C (ASCORBIC ACID) 500 MG tablet Take 500 mg by mouth daily.    Alveda Reasons 20 MG TABS tablet TAKE ONE TABLET BY MOUTH DAILY WITH SUPPER 90 tablet 1   No current facility-administered medications for this visit.    Allergies  Allergen Reactions  . Coreg [Carvedilol] Other (See Comments)    hypotension  . Flomax [Tamsulosin Hcl] Other (See Comments)    Hypotension    Review of Systems negative except from HPI and PMH  Physical Exam BP 130/80 (BP Location: Left Arm, Patient Position: Sitting, Cuff Size: Normal)   Pulse 65   Ht 5\' 6"  (1.676 m)   Wt 220 lb (99.8 kg)   SpO2 95%   BMI 35.51 kg/m  Well developed and nourished in no acute distress HENT normal Neck supple with JVP- 10 Carotids brisk  Device pocket well healed; without hematoma or erythema.  There is no tethering  Clear Irregularly irregular rate and rhythm with controlled ventricular response, no murmurs or gallops Abd-soft with active BS   No Clubbing cyanosis tredema Skin-warm and dry A & Oriented  Grossly normal sensory and motor function  Atrial fibrillation at 63 Interval-/13/43 Left axis deviation -79  Assessment  and  Plan  NICM  Congestive heart failure-chronic-systolic  Afib-permanent  HTN  IVCD/LBBB  Tremor  ICD-MDT    Bradycardia  4% Vp'   Normal device function with reasonable heart rate excursion  Volume overloaded and blood pressure is elevated.  We will undertake an echocardiogram to reassess LV function which would inform medication options i.e. Entresto, or spironolactone.  Previously he was only able to tolerate the latter.  Perhaps now.  There might also be a role for SGLT2  With his episode of chest pain, and his catheterization more than a decade ago, will do Myoview scan

## 2020-08-21 ENCOUNTER — Other Ambulatory Visit: Payer: Self-pay

## 2020-08-21 ENCOUNTER — Other Ambulatory Visit
Admission: RE | Admit: 2020-08-21 | Discharge: 2020-08-21 | Disposition: A | Discharge status: Home / Self Care | Payer: PPO | Source: Ambulatory Visit | Attending: Physician Assistant | Admitting: Physician Assistant

## 2020-08-21 ENCOUNTER — Encounter: Payer: Self-pay | Admitting: Physician Assistant

## 2020-08-21 ENCOUNTER — Ambulatory Visit (INDEPENDENT_AMBULATORY_CARE_PROVIDER_SITE_OTHER): Payer: PPO

## 2020-08-21 ENCOUNTER — Ambulatory Visit: Payer: PPO | Admitting: Physician Assistant

## 2020-08-21 VITALS — BP 118/78 | HR 71 | Ht 66.0 in | Wt 219.5 lb

## 2020-08-21 DIAGNOSIS — R072 Precordial pain: Secondary | ICD-10-CM | POA: Diagnosis not present

## 2020-08-21 DIAGNOSIS — Z95810 Presence of automatic (implantable) cardiac defibrillator: Secondary | ICD-10-CM

## 2020-08-21 DIAGNOSIS — I2510 Atherosclerotic heart disease of native coronary artery without angina pectoris: Secondary | ICD-10-CM

## 2020-08-21 DIAGNOSIS — I428 Other cardiomyopathies: Secondary | ICD-10-CM | POA: Insufficient documentation

## 2020-08-21 DIAGNOSIS — E785 Hyperlipidemia, unspecified: Secondary | ICD-10-CM

## 2020-08-21 DIAGNOSIS — R0609 Other forms of dyspnea: Secondary | ICD-10-CM

## 2020-08-21 DIAGNOSIS — R0602 Shortness of breath: Secondary | ICD-10-CM | POA: Diagnosis not present

## 2020-08-21 DIAGNOSIS — I482 Chronic atrial fibrillation, unspecified: Secondary | ICD-10-CM | POA: Diagnosis not present

## 2020-08-21 DIAGNOSIS — I5020 Unspecified systolic (congestive) heart failure: Secondary | ICD-10-CM | POA: Diagnosis not present

## 2020-08-21 DIAGNOSIS — I10 Essential (primary) hypertension: Secondary | ICD-10-CM | POA: Diagnosis not present

## 2020-08-21 DIAGNOSIS — I5022 Chronic systolic (congestive) heart failure: Secondary | ICD-10-CM

## 2020-08-21 DIAGNOSIS — R0600 Dyspnea, unspecified: Secondary | ICD-10-CM

## 2020-08-21 LAB — CBC WITH DIFFERENTIAL/PLATELET
Abs Immature Granulocytes: 0.01 K/uL (ref 0.00–0.07)
Basophils Absolute: 0 K/uL (ref 0.0–0.1)
Basophils Relative: 1 %
Eosinophils Absolute: 0.1 K/uL (ref 0.0–0.5)
Eosinophils Relative: 1 %
HCT: 43.1 % (ref 39.0–52.0)
Hemoglobin: 14.4 g/dL (ref 13.0–17.0)
Immature Granulocytes: 0 %
Lymphocytes Relative: 22 %
Lymphs Abs: 1.4 K/uL (ref 0.7–4.0)
MCH: 31.9 pg (ref 26.0–34.0)
MCHC: 33.4 g/dL (ref 30.0–36.0)
MCV: 95.4 fL (ref 80.0–100.0)
Monocytes Absolute: 0.7 K/uL (ref 0.1–1.0)
Monocytes Relative: 11 %
Neutro Abs: 4.2 K/uL (ref 1.7–7.7)
Neutrophils Relative %: 65 %
Platelets: 157 K/uL (ref 150–400)
RBC: 4.52 MIL/uL (ref 4.22–5.81)
RDW: 13.8 % (ref 11.5–15.5)
WBC: 6.4 K/uL (ref 4.0–10.5)
nRBC: 0 % (ref 0.0–0.2)

## 2020-08-21 LAB — BASIC METABOLIC PANEL
Anion gap: 9 (ref 5–15)
BUN: 20 mg/dL (ref 8–23)
CO2: 26 mmol/L (ref 22–32)
Calcium: 9.1 mg/dL (ref 8.9–10.3)
Chloride: 105 mmol/L (ref 98–111)
Creatinine, Ser: 1.01 mg/dL (ref 0.61–1.24)
GFR, Estimated: >60 mL/min (ref >60)
Glucose, Bld: 96 mg/dL (ref 70–99)
Potassium: 4.1 mmol/L (ref 3.5–5.1)
Sodium: 140 mmol/L (ref 135–145)

## 2020-08-21 LAB — ECHOCARDIOGRAM COMPLETE
AR max vel: 2.84 cm2
AV Area VTI: 3.04 cm2
AV Area mean vel: 2.91 cm2
AV Mean grad: 3.7 mmHg
AV Peak grad: 6.3 mmHg
Ao pk vel: 1.25 m/s
Calc EF: 29.9 %
S' Lateral: 4.8 cm
Single Plane A2C EF: 24.4 %
Single Plane A4C EF: 31.4 %

## 2020-08-21 LAB — BRAIN NATRIURETIC PEPTIDE: B Natriuretic Peptide: 327.7 pg/mL — ABNORMAL HIGH (ref 0.0–100.0)

## 2020-08-21 MED ORDER — NITROGLYCERIN 0.4 MG SL SUBL: 0.4000 mg | SUBLINGUAL_TABLET | SUBLINGUAL | 1 refills | Status: AC | PRN

## 2020-08-21 NOTE — Patient Instructions (Signed)
Medication Instructions:  - Your physician recommends that you continue on your current medications as directed. Please refer to the Current Medication list given to you today.  *If you need a refill on your cardiac medications before your next appointment, please call your pharmacy*   Lab Work: - Your physician recommends that you have lab work today: BMP/ CBC/ [place] at [facility] 1st desk on the right to check in, past the screening table   If you have labs (blood work) drawn today and your tests are completely normal, you will receive your results only by: Marland Kitchen MyChart Message (if you have MyChart) OR . A paper copy in the mail If you have any lab test that is abnormal or we need to change your treatment, we will call you to review the results.   Testing/Procedures:  1) Lexiscan Myoview (Chemical Stress test)   - we will cancel your stress test for [facility] and reschedule this to be done in [place]  Your caregiver has ordered a Stress Test with nuclear imaging. The purpose of this test is to evaluate the blood supply to your heart muscle. This procedure is referred to as a "Non-Invasive Stress Test." This is because other than having an IV started in your vein, nothing is inserted or "invades" your body. Cardiac stress tests are done to find areas of poor blood flow to the heart by determining the extent of coronary artery disease (CAD). Some patients exercise on a treadmill, which naturally increases the blood flow to your heart, while others who are  unable to walk on a treadmill due to physical limitations have a pharmacologic/chemical stress agent called Lexiscan . This medicine will mimic walking on a treadmill by temporarily increasing your coronary blood flow.   Please note: these test may take anywhere between 2-4 hours to complete  PLEASE REPORT TO [place] AT THE FIRST DESK WILL DIRECT YOU WHERE TO  GO  Date of Procedure:_____________________________________  Arrival Time for Procedure:______________________________  Instructions regarding medication:   _x___ : You may take all of your regular medications the morning of your test unless listed below  ____:  Hold betablocker(s) night before procedure and morning of procedure (BISOPROLOL)  __x__:  Hold LASIX (FUROSEMIDE) the morning of your test  PLEASE NOTIFY THE OFFICE AT LEAST 24 HOURS IN ADVANCE IF YOU ARE UNABLE TO KEEP YOUR APPOINTMENT.  [phone] AND  PLEASE NOTIFY NUCLEAR MEDICINE AT [facility] AT LEAST 24 HOURS IN ADVANCE IF YOU ARE UNABLE TO KEEP YOUR APPOINTMENT. [phone]  How to prepare for your Myoview test:  1. Do not eat or drink after midnight 2. No caffeine for 24 hours prior to test 3. No smoking 24 hours prior to test. 4. Your medication may be taken with water.  If your doctor stopped a medication because of this test, do not take that medication. 5. Ladies, please do not wear dresses.  Skirts or pants are appropriate. Please wear a short sleeve shirt. 6. No perfume, cologne or lotion. 7. Wear comfortable walking shoes. No heels!   Follow-Up: At [facility], you and your health needs are our priority.  As part of our continuing mission to provide you with exceptional heart care, we have created designated Provider Care Teams.  These Care Teams include your primary Cardiologist (physician) and Advanced Practice Providers (APPs -  Physician Assistants and Nurse Practitioners) who all work together to provide you with the care you  need, when you need it.  We recommend signing up for the patient portal called "MyChart".  Sign up information is provided on this After Visit Summary.  MyChart is used to connect with patients for Virtual Visits (Telemedicine).  Patients are able to view lab/test results, encounter notes, upcoming appointments, etc.  Non-urgent messages can be sent to your provider as well.   To  learn more about what you can do with MyChart, go to [email or web address].    Your next appointment:   After stress is completed   The format for your next appointment:   In Person  Provider:   You may see Virl Axe, MD or one of the following Advanced Practice Providers on your designated Care Team:    Murray Hodgkins, NP  Christell Faith, PA-C  Marrianne Mood, PA-C  Cadence Kathlen Mody, [place]  Laurann Montana, NP    Other Instructions   Cardiac Nuclear Scan A cardiac nuclear scan is a test that is done to check the flow of blood to your heart. It is done when you are resting and when you are exercising. The test looks for problems such as:  Not enough blood reaching a portion of the heart.  The heart muscle not working as it should. You may need this test if:  You have heart disease.  You have had lab results that are not normal.  You have had heart surgery or a balloon procedure to open up blocked arteries (angioplasty).  You have chest pain.  You have shortness of breath. In this test, a special dye (tracer) is put into your bloodstream. The tracer will travel to your heart. A camera will then take pictures of your heart to see how the tracer moves through your heart. This test is usually done at a hospital and takes 2-4 hours. Tell a doctor about:  Any allergies you have.  All medicines you are taking, including vitamins, herbs, eye drops, creams, and over-the-counter medicines.  Any problems you or family members have had with anesthetic medicines.  Any blood disorders you have.  Any surgeries you have had.  Any medical conditions you have.  Whether you are pregnant or may be pregnant. What are the risks? Generally, this is a safe test. However, problems may occur, such as:  Serious chest pain and heart attack. This is only a risk if the stress portion of the test is done.  Rapid heartbeat.  A feeling of warmth in your chest. This feeling usually  does not last long.  Allergic reaction to the tracer. What happens before the test?  Ask your doctor about changing or stopping your normal medicines. This is important.  Follow instructions from your doctor about what you cannot eat or drink.  Remove your jewelry on the day of the test. What happens during the test?  An IV tube will be inserted into one of your veins.  Your doctor will give you a small amount of tracer through the IV tube.  You will wait for 20-40 minutes while the tracer moves through your bloodstream.  Your heart will be monitored with an electrocardiogram (ECG).  You will lie down on an exam table.  Pictures of your heart will be taken for about 15-20 minutes.  You may also have a stress test. For this test, one of these things may be done: ? You will be asked to exercise on a treadmill or a stationary bike. ? You will be given medicines that will make your heart work  harder. This is done if you are unable to exercise.  When blood flow to your heart has peaked, a tracer will again be given through the IV tube.  After 20-40 minutes, you will get back on the exam table. More pictures will be taken of your heart.  Depending on the tracer that is used, more pictures may need to be taken 3-4 hours later.  Your IV tube will be removed when the test is over. The test may vary among doctors and hospitals. What happens after the test?  Ask your doctor: ? Whether you can return to your normal schedule, including diet, activities, and medicines. ? Whether you should drink more fluids. This will help to remove the tracer from your body. Drink enough fluid to keep your pee (urine) pale yellow.  Ask your doctor, or the department that is doing the test: ? When will my results be ready? ? How will I get my results? Summary  A cardiac nuclear scan is a test that is done to check the flow of blood to your heart.  Tell your doctor whether you are pregnant or may be  pregnant.  Before the test, ask your doctor about changing or stopping your normal medicines. This is important.  Ask your doctor whether you can return to your normal activities. You may be asked to drink more fluids. This information is not intended to replace advice given to you by your health care provider. Make sure you discuss any questions you have with your health care provider. Document Revised: 08/15/2018 Document Reviewed: 10/09/2017 Elsevier Patient Education  [place].

## 2020-08-21 NOTE — Progress Notes (Signed)
Cardiology Office Note    Date:  08/21/2020   ID:  Chris Price, Chris Price Dec 02, 1940, MRN [id number]  PCP:  [name], MD  Cardiologist:  No primary care provider on file.  Electrophysiologist:  None   Chief Complaint: Dyspnea  History of Present Illness:   Chris Price is a 80 y.o. male with history of nonobstructive CAD by [place] in 07/2013, HFrEF secondary to NICM status post MDT ICD with generator change out in 2018, permanent A. fib, LBBB, HTN, HLD, OSA, degenerative disc disease, and GERD who presents as a work on for dyspnea per echo tech.  Prior LHC in 07/2013 demonstrated nonobstructive CAD with mid LAD 50% stenosis, D1 lesion 150% stenosis, lesion to 70% stenosis, mid LCx 50% stenosis, proximal RCA 30% stenosis, and distal RCA 50% stenosis.  LVEF estimated at 35%.  Medical management was advised.  In the setting of his persistent cardiomyopathy he underwent ICD implantation for primary prevention in 09/2011 with device generator replacement in 02/2017.  With regards to his A. fib he was previously managed on amiodarone and amiodarone.  He developed a significant intention tremor prompting the downward titration and subsequent discontinuation of amiodarone with no change in his underlying tremor.  In this setting his A. fib is now permanent and he has been rate controlled with bisoprolol and anticoagulated with rivaroxaban.  He has previously required tapering/discontinuation/transitioning of GDMT in the setting of hypotension with associated dizziness.  Most recent OptiVol reading showed no significant fluid accumulation from 05/2020.  More recently, he was evaluated by his primary cardiologist on 08/17/2020 with noted mild edema, bend apnea, and an episode of nonexertional chest discomfort.  He was felt to be mildly volume up and it was recommended he briefly transition his prior dosed Lasix which was 40 mg every other day to 40 mg on 4/12 and 4/13 with no Lasix on 4/14 with 40 mg on  4/15 and 4/16, no Lasix on 4/17, 40 mg on 4/18 and 4/19.  Following this he will take 4/20 off and resume prior every other day dosing of 40 mg Lasix on 4/21.  He was scheduled for a Lexiscan MPI and echo.  Patient was in the office today getting his echo and reported continued shortness of breath that has been progressive.  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  When ambulating on flat ground he is typically asymptomatic.  He has not had any further chest pain.  He reports his weight has been stable at approximately 220 pounds which is his baseline.  No worsening orthopnea, lower extremity swelling, abdominal distention, PND, or early satiety.  He does not eat foods high in salt and typically drinks right at 2 L of fluid per day.  Preliminary read on echo is a stable cardiomyopathy with an EF of 30 to 35% as reported by the DOD.   Labs independently reviewed: 08/2020 - potassium 4.1 BUN 20, serum creatinine 1.01, Hgb 14.4, PLT 157, BNP 327 07/2019 - A1c 5.7, TC 143, TG 106, HDL 44, LDL 80, albumin 4.1, AST 37, ALT normal 07/2018 - TSH normal  Past Medical History:  Diagnosis Date  . Atrial fibrillation ([place])   . Cardiomyopathy -nonischemic    moderate to severe EF 25-30% March 2013  . CHF (congestive heart failure) ([place])   . Chronic systolic heart failure ([place]) 08/25/2011  .  Coronary artery disease 08/15/2011   Moderate nonobstructive CAD with 60% mid LAD, 60% proximal RCA and 30 % stenossis left circumflex. Cardiac catheterization in March of 2015 showed no significant change   . Degenerative disk disease    lumbar  . Disc herniation   . GERD (gastroesophageal reflux disease)   . Hyperlipidemia   . Hypertension   . ICD (implantable cardiac defibrillator) in place   . IVCD (intraventricular conduction defect)    Nonspecific QRS duration 122  . Lumbar spinal stenosis   .  Neuromuscular disorder ([place])    neauropathy   / siatica  . Persistent atrial fibrillation ([place]) 11/07/2011  . Sleep apnea    uses cpap    Past Surgical History:  Procedure Laterality Date  . CARDIAC CAT[facility]IZATION  [facility]   . CARDIAC CATHETERIZATION  07/09/2013   [facility]  . CARDIOVERSION  08/2011   [facility]  . CHOLECYSTECTOMY  April 2015  . COLONOSCOPY WITH PROPOFOL N/A 07/30/2015   Procedure: COLONOSCOPY WITH PROPOFOL;  Surgeon: Lollie Sails, MD;  Location[facility] ENDOSCOPY;  Service: Endoscopy;  Laterality: N/A;  . cooled thermotherapy  6237   [place], complicated by incontinence  . HERNIA REPAIR    . ICD  09/08/2011  . ICD GENERATOR CHANGEOUT N/A 03/08/2017   Procedure: ICD GENERATOR CHANGEOUT;  Surgeon: [name], MD;  Location: [facility] CV LAB;  Service: Cardiovascular;  Laterality: N/A;  . IMPLANTABLE CARDIOVERTER DEFIBRILLATOR IMPLANT  09/08/2011   Procedure: IMPLANTABLE CARDIOVERTER DEFIBRILLATOR IMPLANT;  Surgeon: [name], MD;  Location: [facility] CATH LAB;  Service: Cardiovascular;;  . LUMBAR LAMINECTOMY     L2 through S1 with wide decompression of the thecal sac and nerve roots.  Marland Kitchen PROSTATE BIOPSY  2013    Current Medications: Current Meds  Medication Sig  . bisoprolol (ZEBETA) 5 MG tablet Take 1 tablet (5 mg total) by mouth 2 (two) times daily.  Marland Kitchen ezetimibe (ZETIA) 10 MG tablet Take 1 tablet (10 mg total) by mouth daily.  . finasteride (PROSCAR) 5 MG tablet TAKE ONE TABLET BY MOUTH DAILY  . furosemide (LASIX) 20 MG tablet Take by mouth. Take 2 tablets Tuesday and Wednesday , Off Thursday, Take 2 tablets Friday and Saturday, Off Sunday, Take Monday and Tuesday, off Wednesday as directed.  Marland Kitchen lisinopril (ZESTRIL) 2.5 MG tablet Take 1 tablet (2.5 mg total) by mouth daily.  Marland Kitchen loratadine (CLARITIN) 10 MG tablet TAKE ONE TABLET BY MOUTH DAILY AS NEEDED FOR ALLERGIES  . multivitamin (ONE-A-DAY MEN'S) TABS tablet Take 1 tablet by mouth daily.  Marland Kitchen omeprazole (PRILOSEC)  40 MG capsule Take 1 capsule (40 mg total) by mouth daily before breakfast.  . rosuvastatin (CRESTOR) 40 MG tablet Take 1 tablet (40 mg total) by mouth daily.  . traZODone (DESYREL) 100 MG tablet Take 1 tablet (100 mg total) by mouth at bedtime.  . triamcinolone ointment (KENALOG) 0.1 % Apply topically.  . vitamin C (ASCORBIC ACID) 500 MG tablet Take 500 mg by mouth daily.  Alveda Reasons 20 MG TABS tablet TAKE ONE TABLET BY MOUTH DAILY WITH SUPPER  . [DISCONTINUED] nitroGLYCERIN (NITROSTAT) 0.4 MG SL tablet Place 1 tablet (0.4 mg total) under the tongue every 5 (five) minutes as needed for chest pain.    Allergies:   Coreg [carvedilol] and Flomax [tamsulosin hcl]   Social History   Socioeconomic History  . Marital status: Married    Spouse name: Not on file  . Number of children: 3  .  Years of education: Not on file  . Highest education level: 12th grade  Occupational History  . 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 Smoking status: Never Smoker  . Smokeless tobacco: Never Used  Vaping Use  . Vaping Use: Never used  Substance and Sexual Activity  . Alcohol use: No  . Drug use: No  . Sexual activity: Not Currently  Other Topics Concern  . Not on file  Social History Narrative   Second marriage, together since 5   He has 3 children from previous marriage and one step-daughter    Social Determinants of Health   Financial Resource Strain: Low Risk   . Difficulty of Paying Living Expenses: Not hard at all  Food Insecurity: No Food Insecurity  . Worried About Charity fundraiser in the Last Year: Never true  . Ran Out of Food in the Last Year: Never true  Transportation Needs: No Transportation Needs  . Lack of Transportation (Medical): No  . Lack of Transportation (Non-Medical): No  Physical Activity: Sufficiently Active  . Days of Exercise per Week: 3 days  . Minutes of Exercise per Session: 60 min  Stress: No Stress Concern Present  .  Feeling of Stress : Not at all  Social Connections: Moderately Isolated  . Frequency of Communication with Friends and Family: More than three times a week  . Frequency of Social Gatherings with Friends and Family: Three times a week  . Attends Religious Services: Never  . Active Member of Clubs or Organizations: No  . Attends Archivist Meetings: Never  . Marital Status: Married     Family History:  The patient's family history includes Heart attack in his brother, father, and son; Ovarian cancer in his daughter.  ROS:   Review of Systems  Constitutional: Positive for malaise/fatigue. Negative for chills, diaphoresis, fever and weight loss.  HENT: Negative for congestion.   Eyes: Negative for discharge and redness.  Respiratory: Positive for shortness of breath. Negative for cough, sputum production and wheezing.   Cardiovascular: Positive for leg swelling. Negative for chest pain, palpitations, orthopnea, claudication and PND.  Gastrointestinal: Negative for abdominal pain, blood in stool, heartburn, melena, nausea and vomiting.  Musculoskeletal: Negative for falls and myalgias.  Skin: Negative for rash.  Neurological: Negative for dizziness, tingling, tremors, sensory change, speech change, focal weakness, loss of consciousness and weakness.  Endo/Heme/Allergies: Does not bruise/bleed easily.  Psychiatric/Behavioral: Negative for substance abuse. The patient is not nervous/anxious.   All other systems reviewed and are negative.    EKGs/Labs/Other Studies Reviewed:    Studies reviewed were summarized above. The additional studies were reviewed today: As above.  EKG:  EKG is ordered today.  The EKG ordered today demonstrates A. fib, 71 bpm, LBBB (known)  Recent Labs: 08/21/2020: B Natriuretic Peptide 327.7; BUN 20; Creatinine, Ser 1.01; Hemoglobin 14.4; Platelets 157; Potassium 4.1; Sodium 140  Recent Lipid Panel    Component Value Date/Time   CHOL 143 07/31/2019  1127   CHOL 118 07/09/2013 0155   TRIG 106 07/31/2019 1127   TRIG 54 07/09/2013 0155   HDL 44 07/31/2019 1127   HDL 31 (L) 07/09/2013 0155   CHOLHDL 3.3 07/31/2019 1127   VLDL 25.8 06/29/2017 0928   VLDL 11 07/09/2013 0155   LDLCALC 80 07/31/2019 1127   LDLCALC 76 07/09/2013 0155   LDLDIRECT 143.6 03/21/2012 0851    PHYSICAL EXAM:    VS:  BP  118/78 (BP Location: Left Arm, Patient Position: Sitting, Cuff Size: Normal)   Pulse 71   Ht 5\' 6"  (1.676 m)   Wt 219 lb 8 oz (99.6 kg)   SpO2 98%   BMI 35.43 kg/m   BMI: Body mass index is 35.43 kg/m.  Physical Exam Vitals reviewed.  Constitutional:      Appearance: He is well-developed.  HENT:     Head: Normocephalic and atraumatic.  Eyes:     General:        Right eye: No discharge.        Left eye: No discharge.  Neck:     Vascular: No JVD.  Cardiovascular:     Rate and Rhythm: Normal rate. Rhythm irregularly irregular.     Pulses: No midsystolic click and no opening snap.          Posterior tibial pulses are 2+ on the right side and 2+ on the left side.     Heart sounds: Normal heart sounds, S1 normal and S2 normal. Heart sounds not distant. No murmur heard. No friction rub.  Pulmonary:     Effort: Pulmonary effort is normal. No respiratory distress.     Breath sounds: Normal breath sounds. No decreased breath sounds, wheezing or rales.  Chest:     Chest wall: No tenderness.  Abdominal:     General: There is no distension.     Palpations: Abdomen is soft.     Tenderness: There is no abdominal tenderness.  Musculoskeletal:     Cervical back: Normal range of motion.     Right lower leg: Edema present.     Left lower leg: Edema present.     Comments: Trace bilateral pretibial edema  Skin:    General: Skin is warm and dry.     Nails: There is no clubbing.  Neurological:     Mental Status: He is alert and oriented to person, place, and time.  Psychiatric:        Speech: Speech normal.        Behavior: Behavior  normal.        Thought Content: Thought content normal.        Judgment: Judgment normal.     Wt Readings from Last 3 Encounters:  08/21/20 219 lb 8 oz (99.6 kg)  08/17/20 220 lb (99.8 kg)  07/30/20 219 lb (99.3 kg)     ReDs vest: 22%  ASSESSMENT & PLAN:   1. HFrEF secondary to NICM status post MDT ICD: He was seen earlier this week with volume overload and an episode of nonexertional chest discomfort.  In this setting is diuretic was briefly adjusted as outlined above.  With this it appears his volume status has improved with a normal ReDs vest in the office today.  Preliminary report on his echo shows a stable, though persistent cardiomyopathy with an EF of 35%.  He is already scheduled for a Lexiscan MPI, this will be changed from the [employer] office to the medical mall as this is more convenient for the patient.  We were also able to get him in for his stress test on an earlier date this way.  Renal function is stable on labs checked today.  BNP is mildly elevated.  He will continue gentle outpatient diuresis as directed by Dr. Caryl Comes with Lasix 40 mg today, 4/16, 4/18, and 4/19 with no Lasix on 4/17 and 4/20 with resumption of prior dose Lasix 40 mg every other day beginning on 4/21.  He will otherwise continue current GDMT with bisoprolol and lisinopril.  History of symptomatic hypotension has precluded escalation of GDMT (leading to the discontinuation of Entresto and spironolactone), however based on echo we may be able to initiate an SGLT2 inhibitor.  CHF education.  2. Permanent Afib: He remains in A. fib with controlled ventricular response.  Continue rate control with bisoprolol.  Given a CHA2DS2-VASc of at least 5 (CHF, HTN, age x2, vascular disease) he remains on rivaroxaban.  No symptoms concerning for bleeding with normal Hgb obtained earlier today.  Creatinine clearance 79.9 mL/min.  3. Nonobstructive CAD: He has not had any further chest pain though does continue to note  exertional dyspnea.  Lexiscan MPI has been scheduled as outlined above.  4. HTN: Blood pressure is well controlled in the office today.  Continue current medical therapy as outlined above.  5. HLD: LDL 80 in 07/2019.  He remains on rosuvastatin.  6. OSA: CPAP.  This was not discussed in detail at today's visit given problem focused exam.  Disposition: F/u with Dr. Caryl Comes or an APP 1 to 2 weeks post MPI.   Medication Adjustments/Labs and Tests Ordered: Current medicines are reviewed at length with the patient today.  Concerns regarding medicines are outlined above. Medication changes, Labs and Tests ordered today are summarized above and listed in the Patient Instructions accessible in Encounters.   Signed, Christell Faith, PA-C 08/21/2020 3:21 PM     [address] [place], [place] [address] [phone]

## 2020-08-25 ENCOUNTER — Other Ambulatory Visit: Payer: Self-pay | Admitting: Internal Medicine

## 2020-08-27 ENCOUNTER — Ambulatory Visit
Admission: RE | Admit: 2020-08-27 | Discharge: 2020-08-27 | Disposition: A | Discharge status: Home / Self Care | Payer: PPO | Source: Ambulatory Visit | Attending: Physician Assistant | Admitting: Physician Assistant

## 2020-08-27 ENCOUNTER — Other Ambulatory Visit: Payer: Self-pay

## 2020-08-27 DIAGNOSIS — R072 Precordial pain: Secondary | ICD-10-CM | POA: Insufficient documentation

## 2020-08-27 MED ORDER — TECHNETIUM TC 99M TETROFOSMIN IV KIT
10.0000 | PACK | Freq: Once | INTRAVENOUS | Status: AC | PRN
  Administered 2020-08-27: 10.19 via INTRAVENOUS

## 2020-08-27 MED ORDER — REGADENOSON 0.4 MG/5ML IV SOLN
0.4000 mg | Freq: Once | INTRAVENOUS | Status: AC
  Administered 2020-08-27: 0.4 mg via INTRAVENOUS

## 2020-08-27 MED ORDER — TECHNETIUM TC 99M TETROFOSMIN IV KIT
30.0000 | PACK | Freq: Once | INTRAVENOUS | Status: AC | PRN
  Administered 2020-08-27: 30.942 via INTRAVENOUS

## 2020-08-27 NOTE — Telephone Encounter (Signed)
This is a [place] pt 

## 2020-08-27 NOTE — Telephone Encounter (Signed)
Rx request sent to pharmacy.  

## 2020-08-28 LAB — NM MYOCAR MULTI W/SPECT W/WALL MOTION / EF
LV dias vol: 217 mL (ref 62–150)
LV sys vol: 119 mL
MPHR: 139 {beats}/min
Peak HR: 90 {beats}/min
Percent HR: 64 %
Rest HR: 58 {beats}/min
SDS: 5
SRS: 9
SSS: 6
TID: 1.09

## 2020-08-31 ENCOUNTER — Other Ambulatory Visit: Payer: Self-pay

## 2020-08-31 ENCOUNTER — Ambulatory Visit: Payer: PPO | Admitting: Family

## 2020-08-31 ENCOUNTER — Encounter (HOSPITAL_COMMUNITY): Payer: PPO

## 2020-08-31 ENCOUNTER — Encounter: Payer: Self-pay | Admitting: Family

## 2020-08-31 VITALS — BP 130/78 | HR 59 | Ht 66.0 in | Wt 219.0 lb

## 2020-08-31 DIAGNOSIS — I5020 Unspecified systolic (congestive) heart failure: Secondary | ICD-10-CM

## 2020-08-31 DIAGNOSIS — I25118 Atherosclerotic heart disease of native coronary artery with other forms of angina pectoris: Secondary | ICD-10-CM | POA: Diagnosis not present

## 2020-08-31 DIAGNOSIS — I4821 Permanent atrial fibrillation: Secondary | ICD-10-CM | POA: Diagnosis not present

## 2020-08-31 DIAGNOSIS — Z7901 Long term (current) use of anticoagulants: Secondary | ICD-10-CM | POA: Diagnosis not present

## 2020-08-31 DIAGNOSIS — Z95810 Presence of automatic (implantable) cardiac defibrillator: Secondary | ICD-10-CM | POA: Diagnosis not present

## 2020-08-31 DIAGNOSIS — I428 Other cardiomyopathies: Secondary | ICD-10-CM

## 2020-08-31 MED ORDER — DAPAGLIFLOZIN PROPANEDIOL 10 MG PO TABS: 10.0000 mg | ORAL_TABLET | Freq: Every day | ORAL | 5 refills | Status: DC

## 2020-08-31 NOTE — Patient Instructions (Signed)
Medication Instructions:  Your physician has recommended you make the following change in your medication:   START Dapagliflozin Wilder Glade) 10mg  daily   *If you need a refill on your cardiac medications before your next appointment, please call your pharmacy*   Lab Work: Your physician recommends that you return for lab work in 2 weeks at the [facility], [place] at [facility] 1st desk on the right to check in, past the screening table Lab hours: Monday- Friday (7:30 am- 5:30 pm)  If you have labs (blood work) drawn today and your tests are completely normal, you will receive your results only by: Marland Kitchen MyChart Message (if you have MyChart) OR . A paper copy in the mail If you have any lab test that is abnormal or we need to change your treatment, we will call you to review the results.   Testing/Procedures: Your EKG today was stable    Follow-Up: At [facility], you and your health needs are our priority.  As part of our continuing mission to provide you with exceptional heart care, we have created designated Provider Care Teams.  These Care Teams include your primary Cardiologist (physician) and Advanced Practice Providers (APPs -  Physician Assistants and Nurse Practitioners) who all work together to provide you with the care you need, when you need it.  We recommend signing up for the patient portal called "MyChart".  Sign up information is provided on this After Visit Summary.  MyChart is used to connect with patients for Virtual Visits (Telemedicine).  Patients are able to view lab/test results, encounter notes, upcoming appointments, etc.  Non-urgent messages can be sent to your provider as well.   To learn more about what you can do with MyChart, go to [email or web address].    Your next appointment:   2 month(s)  The format for your next appointment:   In Person  Provider:   You may see Virl Axe, MD or one of the following Advanced Practice  Providers on your designated Care Team:    Murray Hodgkins, NP  Christell Faith, PA-C  Marrianne Mood, PA-C  Cadence Kathlen Mody, [place]  Laurann [place], NP  Other Instructions  Recommend weighing daily and keeping a log. Please call our office if you have weight gain of 2 pounds overnight or 5 pounds in 1 week.   Heart Healthy Diet Recommendations: A low-salt diet is recommended. Meats should be grilled, baked, or boiled. Avoid fried foods. Focus on lean protein sources like fish or chicken with vegetables and fruits. The American Heart Association is a Microbiologist!  American Heart Association Diet and Lifeystyle Recommendations   Exercise recommendations: The American Heart Association recommends 150 minutes of moderate intensity exercise weekly. Try 30 minutes of moderate intensity exercise 4-5 times per week. This could include walking, jogging, or swimming.

## 2020-08-31 NOTE — Progress Notes (Signed)
Office Visit    Patient Name: Chris Price Date of Encounter: 08/31/2020  PCP:  [name], [facility]  Cardiologist:  Virl Axe, MD   Chief Complaint    Chris Price is a 80 y.o. male with a hx of  nonobstructive coronary artery disease by cardiac catheterization 07/2013, HFrEF secondary to nonischemic cardiomyopathy s/p MDT ICD with generator change out 2018, permanent atrial fibrillation, LBBB, hypertension, hyperlipidemia, OSA, degenerative disc disease, GERD presents today for follow-up after Chris Price  Past Medical History    Past Medical History:  Diagnosis Date  . Atrial fibrillation ([place])   . Cardiomyopathy -nonischemic    moderate to severe EF 25-30% March 2013  . CHF (congestive heart failure) ([place])   . Chronic systolic heart failure ([place]) 08/25/2011  . Coronary artery disease 08/15/2011   Moderate nonobstructive CAD with 60% mid LAD, 60% proximal RCA and 30 % stenossis left circumflex. Cardiac catheterization in March of 2015 showed no significant change   . Degenerative disk disease    lumbar  . Disc herniation   . GERD (gastroesophageal reflux disease)   . Hyperlipidemia   . Hypertension   . ICD (implantable cardiac defibrillator) in place   . IVCD (intraventricular conduction defect)    Nonspecific QRS duration 122  . Lumbar spinal stenosis   . Neuromuscular disorder ([place])    neauropathy   / siatica  . Persistent atrial fibrillation ([place]) 11/07/2011  . Sleep apnea    uses cpap   Past Surgical History:  Procedure Laterality Date  . CARDIAC [facility]IZATION  [facility]   . CARDIAC CATHETERIZATION  07/09/2013   [facility]  . CARDIOVERSION  08/2011   [facility]  . CHOLECYSTECTOMY  April 2015  . COLONOSCOPY WITH PROPOFOL N/A 07/30/2015   Procedure: COLONOSCOPY WITH PROPOFOL;  Surgeon: Lollie Sails, MD;  Location: [facility] ENDOSCOPY;  Service: Endoscopy;  Laterality: N/A;  . cooled thermotherapy  8144   [place],  complicated by incontinence  . HERNIA REPAIR    . ICD  09/08/2011  . ICD GENERATOR CHANGEOUT N/A 03/08/2017   Procedure: ICD GENERATOR CHANGEOUT;  Surgeon: [name], MD;  Location: [facility] CV LAB;  Service: Cardiovascular;  Laterality: N/A;  . IMPLANTABLE CARDIOVERTER DEFIBRILLATOR IMPLANT  09/08/2011   Procedure: IMPLANTABLE CARDIOVERTER DEFIBRILLATOR IMPLANT;  Surgeon: [name], MD;  Location: [facility] CATH LAB;  Service: Cardiovascular;;  . LUMBAR LAMINECTOMY     L2 through S1 with wide decompression of the thecal sac and nerve roots.  Marland Kitchen PROSTATE BIOPSY  2013    Allergies  Allergies  Allergen Reactions  . Coreg [Carvedilol] Other (See Comments)    hypotension  . Flomax [Tamsulosin Hcl] Other (See Comments)    Hypotension    History of Present Illness    Chris Price is a 80 y.o. male with a hx of nonobstructive coronary artery disease by cardiac catheterization 07/2013, HFrEF secondary to nonischemic cardiomyopathy s/p MDT ICD with generator change out 2018, permanent atrial fibrillation, LBBB, hypertension, hyperlipidemia, OSA, degenerative disc disease, GERD last seen by Chris Faith, PA on 08/21/2020.  Previous cardiac catheterization March 2015 with nonobstructive CAD and LVEF estimated at 35%.  Recommended for medical management.  He underwent ICD implantation for primary prevention May 2013 due to persistent cardiomyopathy with device generator replacement in October 2018.  He was previously managed on amiodarone though developed significant tremor prompting down titration and discontinuation.  His tremor remained unchanged.  He has been rate controlled with bisoprolol and anticoagulated with Xarelto.  He has previously required tapering, discontinuing, transitioning of GDMT in the setting hypotension associated with dizziness.  OptiVol reading January 2022 with no significant fluid accumulation.  He was evaluated by Dr. Caryl Comes 08/17/2020 noting mild edema, bend apnea,  episode of nonexertional chest discomfort.  He was felt to be mildly volume up and recommended for short course of increased diuresis.  Lexiscan Myoview and echocardiogram were recommended.  Echo 08/21/2020 LVEF 30 to 35%, LV global hypokinesis, mild LVH, indeterminate diastolic parameters, RV SF low normal, bilateral atria severely dilated, mild MR, mild-moderate TR, mild AI, mild to moderate aortic valve sclerosis without stenosis.  Lexiscan Myoview 20 05/2019 with a small region fixed defect in the apical region, mild global hypokinesis present. EF estimated at 35%.  No significant ischemia noted.  Degeneration correction images with three-vessel coronary calcification.  No EKG changes concerning for ischemia. Deemed moderate risk scan.  He presents today for follow-up with his wife. We reviewed his stress test and echocardiogram in detail. His weight is stable compared to clinic visit 10 days ago. Monitoring at home with stable weight. BP routinely <130/80. Endorses continued dyspnea on exertion but was able to work in the yard all day yesterday. Denies orthopnea, PND, edema. Notes he has not been sleeping well despite his trazodone which he attributes to worry. Denies chest pain, pressure, tightness.   EKGs/Labs/Other Studies Reviewed:   The following studies were reviewed today:  Echo 08/21/20 1. Left ventricular ejection fraction, by estimation, is 30 to 35%. The  left ventricle has moderate to severely decreased function. The left  ventricle demonstrates global hypokinesis. There is mild left ventricular  hypertrophy. Left ventricular  diastolic parameters are indeterminate.   2. Right ventricular systolic function is low normal. The right  ventricular size is normal.   3. Left atrial size was severely dilated.   4. Right atrial size was severely dilated.   5. The mitral valve is normal in structure. Mild mitral valve  regurgitation.   6. Tricuspid valve regurgitation is mild to  moderate.   7. The aortic valve is tricuspid. Aortic valve regurgitation is mild.  Mild to moderate aortic valve sclerosis/calcification is present, without  any evidence of aortic stenosis.   Lexiscan Myoview 08/27/20 Pharmacological myocardial perfusion imaging study with no significant ischemia Small region fixed defect in the apical region LV is dilated Mild global hypokinesis, hypokinesis most notable in the septal wall ,  EF estimated at 35% CT attenuation correction images with three-vessel coronary calcification No EKG changes concerning for ischemia at peak stress or in recovery. Moderate risk scan  EKG:  EKG is ordered today.  The ekg ordered today demonstrates atrial fibrillation 59bpm with occasional PVC, left axis deviation, and LBBB.   Recent Labs: 08/21/2020: B Natriuretic Peptide 327.7; BUN 20; Creatinine, Ser 1.01; Hemoglobin 14.4; Platelets 157; Potassium 4.1; Sodium 140  Recent Lipid Panel    Component Value Date/Time   CHOL 143 07/31/2019 1127   CHOL 118 07/09/2013 0155   TRIG 106 07/31/2019 1127   TRIG 54 07/09/2013 0155   HDL 44 07/31/2019 1127   HDL 31 (L) 07/09/2013 0155   CHOLHDL 3.3 07/31/2019 1127   VLDL 25.8 06/29/2017 0928   VLDL 11 07/09/2013 0155   LDLCALC 80 07/31/2019 1127   LDLCALC 76 07/09/2013 0155   LDLDIRECT 143.6 03/21/2012 0851   Home Medications   Current Meds  Medication Sig  . bisoprolol (ZEBETA) 5  MG tablet Take 1 tablet (5 mg total) by mouth 2 (two) times daily.  . dapagliflozin propanediol (FARXIGA) 10 MG TABS tablet Take 1 tablet (10 mg total) by mouth daily.  Marland Kitchen ezetimibe (ZETIA) 10 MG tablet Take 1 tablet (10 mg total) by mouth daily.  . finasteride (PROSCAR) 5 MG tablet TAKE ONE TABLET BY MOUTH DAILY  . furosemide (LASIX) 20 MG tablet Take by mouth. Take 2 tablets Tuesday and Wednesday , Off Thursday, Take 2 tablets Friday and Saturday, Off Sunday, Take Monday and Tuesday, off Wednesday as directed.  Marland Kitchen lisinopril (ZESTRIL) 2.5  MG tablet Take 1 tablet (2.5 mg total) by mouth daily.  Marland Kitchen loratadine (CLARITIN) 10 MG tablet TAKE ONE TABLET BY MOUTH DAILY AS NEEDED FOR ALLERGIES  . multivitamin (ONE-A-DAY MEN'S) TABS tablet Take 1 tablet by mouth daily.  . nitroGLYCERIN (NITROSTAT) 0.4 MG SL tablet Place 1 tablet (0.4 mg total) under the tongue every 5 (five) minutes as needed for chest pain.  Marland Kitchen omeprazole (PRILOSEC) 40 MG capsule Take 1 capsule (40 mg total) by mouth daily before breakfast.  . rosuvastatin (CRESTOR) 40 MG tablet Take 1 tablet (40 mg total) by mouth daily.  . traZODone (DESYREL) 100 MG tablet Take 1 tablet (100 mg total) by mouth at bedtime.  . triamcinolone ointment (KENALOG) 0.1 % Apply topically.  . vitamin C (ASCORBIC ACID) 500 MG tablet Take 500 mg by mouth daily.  Alveda Reasons 20 MG TABS tablet TAKE ONE TABLET BY MOUTH DAILY WITH SUPPER     Review of Systems  All other systems reviewed and are otherwise negative except as noted above.  Physical Exam    VS:  BP 130/78 (BP Location: Left Arm, Patient Position: Sitting, Cuff Size: Normal)   Pulse (!) 59   Ht 5\' 6"  (1.676 m)   Wt 219 lb (99.3 kg)   SpO2 96%   BMI 35.35 kg/m  , BMI Body mass index is 35.35 kg/m.  Wt Readings from Last 3 Encounters:  08/31/20 219 lb (99.3 kg)  08/21/20 219 lb 8 oz (99.6 kg)  08/17/20 220 lb (99.8 kg)    GEN: Well nourished, well developed, in no acute distress. HEENT: normal Neck: Supple, no JVD, carotid bruits, or masses. Cardiac:RRR, no murmurs, rubs, or gallops. No clubbing, cyanosis, edema.  Radials/DP/PT 2+ and equal bilaterally.  Respiratory:  Respirations regular and unlabored, clear to auscultation bilaterally. GI: Soft, nontender, nondistended. MS: No deformity or atrophy. Skin: Warm and dry, no rash. Neuro:  Strength and sensation are intact. Psych: Normal affect.  Assessment & Plan    1. HFrEF secondary to nonischemic cardiomyopathy / s/p ICD- Euvolemic and well compensated. NYHA II-III with  dyspnea. Echo with stable reduced LVEF 35%. ICD in place. GDMT includes Bisoprolol, Lasix, Lisinopril. Previous intolerance to Spironolactone and Entresto. Offered cardiac rehab which he politely declines. Add [place] 10mg  daily for optimization of GDMT. Plan for BMP, BNP in 2 weeks for monitoring.   2. Permanent atrial fibrillation on chronic anticoagulation- Rate controlled today. Denies bleeding complications on Xarelto 20mg  daily. Continue Bisoprolol 5mg  BID.   3. Nonobstructive CAD- Lexiscan 08/2020 no significant ischemia. GDMT includes Rosuvastatin, Zetia, Bisoprolol. No aspirin due to chronic anticoagulation.   4. Hypertension- BP well controlled. Continue current antihypertensive regimen.   5. Hyperlipidemia- 07/2019 LDL 80. Due for lipid panel with next fasting lab work. Continue Zetia 10mg  daily, Crestor 40mg  daily.   6. OSA- CPAP compliance encouraged.  Disposition: Follow up in 2 month(s) with Dr.  Caryl Comes or APP   Signed, Loel Dubonnet, NP 08/31/2020, 4:46 PM [facility]

## 2020-09-01 ENCOUNTER — Telehealth: Payer: Self-pay | Admitting: Internal Medicine

## 2020-09-01 ENCOUNTER — Ambulatory Visit (INDEPENDENT_AMBULATORY_CARE_PROVIDER_SITE_OTHER): Payer: PPO

## 2020-09-01 DIAGNOSIS — I428 Other cardiomyopathies: Secondary | ICD-10-CM

## 2020-09-01 LAB — CUP PACEART REMOTE DEVICE CHECK
Battery Remaining Longevity: 91 mo
Battery Voltage: 2.99 V
Brady Statistic AP VP Percent: 0 %
Brady Statistic AP VS Percent: 0 %
Brady Statistic AS VP Percent: 1.72 %
Brady Statistic AS VS Percent: 98.28 %
Brady Statistic RA Percent Paced: 0 %
Brady Statistic RV Percent Paced: 5.73 %
Date Time Interrogation Session: 20220426033623
HighPow Impedance: 60 Ohm
Implantable Lead Implant Date: 20130502
Implantable Lead Implant Date: 20130502
Implantable Lead Location: 753859
Implantable Lead Location: 753860
Implantable Lead Model: 5076
Implantable Lead Model: 6935
Implantable Pulse Generator Implant Date: 20181031
Lead Channel Impedance Value: 342 Ohm
Lead Channel Impedance Value: 456 Ohm
Lead Channel Impedance Value: 494 Ohm
Lead Channel Pacing Threshold Amplitude: 0.75 V
Lead Channel Pacing Threshold Pulse Width: 0.4 ms
Lead Channel Sensing Intrinsic Amplitude: 5.625 mV
Lead Channel Sensing Intrinsic Amplitude: 5.625 mV
Lead Channel Setting Pacing Amplitude: 2.5 V
Lead Channel Setting Pacing Pulse Width: 0.4 ms
Lead Channel Setting Sensing Sensitivity: 0.3 mV

## 2020-09-01 NOTE — Telephone Encounter (Signed)
[name] calling  Wilder Glade will need prior auth Call [phone] option 3 [place]: [place] [id number]

## 2020-09-01 NOTE — Telephone Encounter (Signed)
Form received from [place], NP this afternoon and faxed to [place]. Stored in filing cabinet in sample room.

## 2020-09-01 NOTE — Telephone Encounter (Signed)
Received coverage determination request form from [name]. Starting PA process and will fax for when completed.   Form completed and given to [facility], NP to sign.

## 2020-09-04 NOTE — Telephone Encounter (Signed)
Received fax from [place] stating coverage determination request was denied.   Reason for denial states one formulary alternative within the same drug class that is appropriate to treat the diagnosis must be tried and failed prior to approval of this non-formulary drug, OR prescriber can submit a supporting statement to explain why formulary drug is not appropriate for the pt. Alternative per [name] is Jardiance.   Spoke to pt, notified sending appeal. He confirms that he did not pick up Rx since insurance did not cover.   Faxed appeal to [name] @ [phone] with notes attached and explanation below:  "per recently published 2022 American Heart Association Heart Failure Treatment guidelines Dapagliflozin Wilder Glade) is the preferred SGLT2i for heart failure due to its cardiovascular risk reduction benefit. When studied in clinical trials, Jardiance did not offer the same benefit"

## 2020-09-07 DIAGNOSIS — G4733 Obstructive sleep apnea (adult) (pediatric): Secondary | ICD-10-CM | POA: Diagnosis not present

## 2020-09-07 NOTE — Telephone Encounter (Signed)
Lennette Bihari from [employer] calling to discuss appeal below and to ask if alternatives were tried.    Please call.

## 2020-09-08 NOTE — Telephone Encounter (Signed)
Called [name] back and LVM. [name] does have better mortality data than [name]. Just even if appeals is approved, patient will then need to pay a tier 4 copay instead of a 3. I think changing to [name] is an acceptable alternative.

## 2020-09-08 NOTE — Telephone Encounter (Signed)
Spoke with Lennette Bihari, advised that pt has not tried [place]. Confirmed if approved, would be a tier 4. He will bring case to medical director.

## 2020-09-09 NOTE — Telephone Encounter (Signed)
Discussed with pharmacy team. Recommend stop [place] and start Jardiance 10mg  daily per insurance preference.  Loel Dubonnet, NP

## 2020-09-10 MED ORDER — EMPAGLIFLOZIN 10 MG PO TABS: 10.0000 mg | ORAL_TABLET | Freq: Every day | ORAL | 5 refills | Status: DC

## 2020-09-10 NOTE — Telephone Encounter (Signed)
Called patient and spoke with him and his wife to relay [facility] recommendation as stated below. Patients wife verbalized understanding and agreed with plan. Sent prescription in.

## 2020-09-10 NOTE — Addendum Note (Signed)
Addended by: Kavin Leech on: 09/10/2020 03:47 PM   Modules accepted: Orders

## 2020-09-22 NOTE — Progress Notes (Signed)
Remote ICD transmission.   

## 2020-10-19 ENCOUNTER — Other Ambulatory Visit: Payer: Self-pay

## 2020-10-19 DIAGNOSIS — I428 Other cardiomyopathies: Secondary | ICD-10-CM

## 2020-10-19 DIAGNOSIS — N401 Enlarged prostate with lower urinary tract symptoms: Secondary | ICD-10-CM

## 2020-10-19 MED ORDER — RIVAROXABAN 20 MG PO TABS: ORAL_TABLET | ORAL | 1 refills | Status: DC

## 2020-10-19 NOTE — Telephone Encounter (Signed)
Refill request

## 2020-10-19 NOTE — Telephone Encounter (Signed)
43m, 99.3kg, scr 1.01 08/21/20, lovw/walker 08/31/20, ccr 68.5

## 2020-10-19 NOTE — Telephone Encounter (Signed)
This is a [place] pt 

## 2020-11-03 ENCOUNTER — Encounter: Payer: Self-pay | Admitting: Internal Medicine

## 2020-11-03 ENCOUNTER — Telehealth: Payer: Self-pay | Admitting: Internal Medicine

## 2020-11-03 ENCOUNTER — Ambulatory Visit: Payer: PPO | Admitting: Internal Medicine

## 2020-11-03 ENCOUNTER — Other Ambulatory Visit: Payer: Self-pay

## 2020-11-03 DIAGNOSIS — I428 Other cardiomyopathies: Secondary | ICD-10-CM | POA: Diagnosis not present

## 2020-11-03 DIAGNOSIS — I429 Cardiomyopathy, unspecified: Secondary | ICD-10-CM | POA: Diagnosis not present

## 2020-11-03 LAB — PACEMAKER DEVICE OBSERVATION

## 2020-11-03 MED ORDER — RIVAROXABAN 20 MG PO TABS: ORAL_TABLET | ORAL | 1 refills | Status: DC

## 2020-11-03 MED ORDER — ENTRESTO 24-26 MG PO TABS: 1.0000 | ORAL_TABLET | Freq: Two times a day (BID) | ORAL | 5 refills | Status: DC

## 2020-11-03 NOTE — Patient Instructions (Signed)
Medication Instructions:  Your physician has recommended you make the following change in your medication:  1) INCREASE Lasix to 40 mg daily for 5 days. Then reduce to your normal dosage  2) STOP Lisinopril  3) START Entresto 24/26 mg twice a day. (48 hours after stopping lisinopril) You have been given samples of Entresto today.  4) You have been given a written prescription for Xarelto   *If you need a refill on your cardiac medications before your next appointment, please call your pharmacy*   Lab Work: None ordered If you have labs (blood work) drawn today and your tests are completely normal, you will receive your results only by: [place] (if you have MyChart) OR A paper copy in the mail If you have any lab test that is abnormal or we need to change your treatment, we will call you to review the results.   Testing/Procedures: None ordered   Follow-Up: At [facility], you and your health needs are our priority.  As part of our continuing mission to provide you with exceptional heart care, we have created designated Provider Care Teams.  These Care Teams include your primary Cardiologist (physician) and Advanced Practice Providers (APPs -  Physician Assistants and Nurse Practitioners) who all work together to provide you with the care you need, when you need it.  We recommend signing up for the patient portal called "MyChart".  Sign up information is provided on this After Visit Summary.  MyChart is used to connect with patients for Virtual Visits (Telemedicine).  Patients are able to view lab/test results, encounter notes, upcoming appointments, etc.  Non-urgent messages can be sent to your provider as well.   To learn more about what you can do with MyChart, go to [email or web address].    Your next appointment:   8 week(s)   The format for your next appointment:   In Person  Provider:   Murray Hodgkins, NP   Your physician wants you to follow-up in: 6  months with Dr. Gari Crown will receive a reminder letter in the mail two months in advance. If you don't receive a letter, please call our office to schedule the follow-up appointment.    Other Instructions Remote monitoring is used to monitor your Pacemaker of ICD from home. This monitoring reduces the number of office visits required to check your device to one time per year. It allows [place] to keep an eye on the functioning of your device to ensure it is working properly. You are scheduled for a device check from home on 12/01/20. You may send your transmission at any time that day. If you have a wireless device, the transmission will be sent automatically. After your physician reviews your transmission, you will receive a postcard with your next transmission date.

## 2020-11-03 NOTE — Telephone Encounter (Signed)
Patient calling to let Dr. Caryl Comes know from todays visit that he is NOT taking Spironolactone

## 2020-11-03 NOTE — Telephone Encounter (Signed)
Noted. FYI update given to Dr. Caryl Comes.

## 2020-11-03 NOTE — Progress Notes (Signed)
Patient ID: Chris Price, male   DOB: 1941/03/24, 80 y.o.   MRN: [id number]  Patient Care Team: [name], MD as PCP - General (Family Medicine) [name], MD as PCP - Cardiology (Cardiology) [name], MD as Consulting Physician (Cardiology) [name], MD as Consulting Physician (Urology)   HPI  Chris Price is a 80 y.o. male Seen in followup for an ICD implanted for primary prevention in nonischemic cardiomyopathy further complicated by sinus bradycardia/ brady -date of implant May 2013.  He underwent device generator replacement 10/18.   History of atrial fibrillation for which he was managed and amiodarone.    Atrial fibrillation is now permanent.   Anticoagulation with Rivaroxaban  no bleeding   At last visit, add bisoprolol for HR w improvement 2019 low blood pressure associated with dizziness prompted the discontinuation of spironolactone and changing of his carvedilol--bisoprolol and a down titration of his lisinopril      Today, the patient denies  nocturnal dyspnea, orthopnea or peripheral edema.  There have been no palpitations, lightheadedness or syncope.  Complains of shortness of breath and chest tightness.  No abdominal fullness or early satiety He have been experiencing fatigue and his shortness of breath have worsened over the last 6 months   He had an episode of tightness in his chest when he is physically active    Seen a few months ago because of exertional chest discomfort.  Echo and Myoview showed stable LV function and no ischemia.  Started on Jardiance  Urinates briskly with furosemide  but due to his occupation he will take a dose in the morning and the other dose when he returns home in the evening    His diet replete's his sodium intake: eating chips   Atrial fibrillation with intermittent ventricular pacing DATE Test EF%   3/15 Cath  35 % Nonobstructive disease  4/22  Echo 30-35%   4/22 Myoview  35% No ischemia      Date Cr K Hgb  10/18 0.95 4.9    3/20 0.96 4.5 14.8  3/21 1.03 4.3 14.9  11/21 1.0 4.2 15.6  4/22 1.01 4.1 81.1   Thromboembolic risk factors ( age  -2, HTN-1, Vasc disease -1, CHF-1,) for a CHADSVASc Score of 5  Past Medical History:  Diagnosis Date   Atrial fibrillation ([place])    Cardiomyopathy -nonischemic    moderate to severe EF 25-30% March 2013   CHF (congestive heart failure) (HCC)    Chronic systolic heart failure ([place]) 08/25/2011   Coronary artery disease 08/15/2011   Moderate nonobstructive CAD with 60% mid LAD, 60% proximal RCA and 30 % stenossis left circumflex. Cardiac catheterization in March of 2015 showed no significant change    Degenerative disk disease    lumbar   Disc herniation    GERD (gastroesophageal reflux disease)    Hyperlipidemia    Hypertension    ICD (implantable cardiac defibrillator) in place    IVCD (intraventricular conduction defect)    Nonspecific QRS duration 122   Lumbar spinal stenosis    Neuromuscular disorder ([place])    neauropathy   / siatica   Persistent atrial fibrillation ([place]) 11/07/2011   Sleep apnea    uses cpap    Past Surgical History:  Procedure Laterality Date   CARD[facility]  [facility]    CARDIAC CATHETERIZATION  07/09/2013   [facility]   CARDIOVERSION  08/2011   [facility]   CHOLECYSTECTOMY  April 2015  COLONOSCOPY WITH PROPOFOL N/A 07/30/2015   Procedure: COLONOSCOPY WITH PROPOFOL;  Surgeon: Lollie Sails, MD;  Location: [facility] ENDOSCOPY;  Service: Endoscopy;  Laterality: N/A;   cooled thermotherapy  6384   [place], complicated by incontinence   HERNIA REPAIR     ICD  09/08/2011   ICD GENERATOR CHANGEOUT N/A 03/08/2017   Procedure: ICD GENERATOR CHANGEOUT;  Surgeon: [name], MD;  Location: [place] [place] CV LAB;  Service: Cardiovascular;  Laterality: N/A;   IMPLANTABLE CARDIOVERTER DEFIBRILLATOR IMPLANT  09/08/2011   Procedure: IMPLANTABLE CARDIOVERTER DEFIBRILLATOR IMPLANT;  Surgeon: [name], MD;   Location: [facility] CATH LAB;  Service: Cardiovascular;;   LUMBAR LAMINECTOMY     L2 through S1 with wide decompression of the thecal sac and nerve roots.   PROSTATE BIOPSY  2013    Current Outpatient Medications  Medication Sig Dispense Refill   bisoprolol (ZEBETA) 5 MG tablet Take 1 tablet (5 mg total) by mouth 2 (two) times daily. 180 tablet 0   empagliflozin (JARDIANCE) 10 MG TABS tablet Take 1 tablet (10 mg total) by mouth daily before breakfast. 30 tablet 5   ezetimibe (ZETIA) 10 MG tablet Take 1 tablet (10 mg total) by mouth daily. 90 tablet 1   finasteride (PROSCAR) 5 MG tablet TAKE ONE TABLET BY MOUTH DAILY 90 tablet 3   furosemide (LASIX) 20 MG tablet Take by mouth. Take 2 tablets Tuesday and Wednesday , Off Thursday, Take 2 tablets Friday and Saturday, Off Sunday, Take Monday and Tuesday, off Wednesday as directed.     lisinopril (ZESTRIL) 2.5 MG tablet Take 1 tablet (2.5 mg total) by mouth daily. 90 tablet 1   loratadine (CLARITIN) 10 MG tablet TAKE ONE TABLET BY MOUTH DAILY AS NEEDED FOR ALLERGIES 90 tablet 1   multivitamin (ONE-A-DAY MEN'S) TABS tablet Take 1 tablet by mouth daily.     nitroGLYCERIN (NITROSTAT) 0.4 MG SL tablet Place 1 tablet (0.4 mg total) under the tongue every 5 (five) minutes as needed for chest pain. 25 tablet 1   omeprazole (PRILOSEC) 40 MG capsule Take 1 capsule (40 mg total) by mouth daily before breakfast. 90 capsule 1   rivaroxaban (XARELTO) 20 MG TABS tablet TAKE ONE TABLET BY MOUTH DAILY WITH SUPPER 90 tablet 1   rosuvastatin (CRESTOR) 40 MG tablet Take 1 tablet (40 mg total) by mouth daily. 90 tablet 1   traZODone (DESYREL) 100 MG tablet Take 1 tablet (100 mg total) by mouth at bedtime. 90 tablet 1   triamcinolone ointment (KENALOG) 0.1 % Apply topically.     vitamin C (ASCORBIC ACID) 500 MG tablet Take 500 mg by mouth daily.     No current facility-administered medications for this visit.    Allergies  Allergen Reactions   Coreg [Carvedilol] Other  (See Comments)    hypotension   Flomax [Tamsulosin Hcl] Other (See Comments)    Hypotension    Review of Systems negative except from HPI and PMH  Physical Exam: BP 120/84 (BP Location: Left Arm, Patient Position: Sitting, Cuff Size: Normal)   Pulse 62   Ht 5\' 6"  (1.676 m)   Wt 215 lb (97.5 kg)   SpO2 97%   BMI 34.70 kg/m  Well developed and Morbidity Obese and well nourished in no acute distress HENT normal Neck supple with JVP-10 Lungs Clear Device pocket well healed; without hematoma or erythema.  There is no tethering  Regular rate and rhythm, No gallop No murmur Abd-soft with active BS No Clubbing cyanosis Trace  of LE edema Skin-warm and dry A & Oriented  Grossly normal sensory and motor function  ECG: Atrial fibrillation at 62 Intervals-/12/44 Axis left -76 Occasional ventricular pacing    Assessment and  Plan  NICM  Congestive heart failure-acute/chronic-systolic grade 3  Afib-permanent  Chest pain  HTN  IVCD/LBBB  Tremor  ICD-MDT    Bradycardia  4% Vp'  Given cardiomyopathy, we will continue Jardiance 10, bisoprolol 5 twice daily we will try again to initiate Entresto; will stop his lisinopril and put him on 24-26.  We have reviewed again his prior history of orthostatic intolerance.  Atrial fibrillation.  Permanent.  On Xarelto.  No bleeding.  Continue at 20 mg daily   dosed appropriately we will refill the prescription today so that he may be able to obtain it from [place]  Volume overloaded.  Contributing to his congestive failure.  We will increase his diuretics from 40 q. OD--40 daily x5 days and then resume at 40 q. OD.  He will let us know next week how he is feeling.  Encouraged him to decrease the high sodium diet   Chest pain is unlikely to be ischemic.  Negative catheterization about 5 years ago negative Myoview.  Hence would have a relatively high threshold for pursuing catheterization   I,Stephanie Williams,acting as a scribe for  Virl Axe, MD.,have documented all relevant documentation on the behalf of Virl Axe, MD,as directed by  Virl Axe, MD while in the presence of Virl Axe, MD.  I, Virl Axe, MD, have reviewed all documentation for this visit. The documentation on 11/03/20 for the exam, diagnosis, procedures, and orders are all accurate and complete.

## 2020-11-04 ENCOUNTER — Telehealth: Payer: Self-pay

## 2020-11-04 NOTE — Telephone Encounter (Signed)
PA started through [place]: Trena Platt - PA Case ID: [id number] - Rx #: [id number]  [name] has received your information, and the request will be reviewed. You may close this dialog, return to your dashboard, and perform other tasks.  You will receive an electronic determination in CoverMyMeds. You can see the latest determination by locating this request on your dashboard or by reopening this request. You will also receive a faxed copy of the determination. If you have any questions please contact [name] at [phone].  If you need assistance, please chat with CoverMyMeds or call us at [phone].

## 2020-11-04 NOTE — Telephone Encounter (Signed)
Patient spouse calling  States that on AVS is states to stop taking lisinopril States that patient was not taking anyway Wants to clarify medications Please call to discuss

## 2020-11-04 NOTE — Telephone Encounter (Signed)
Spoke with patient and reviewed that it did in fact state to discontinue lisinopril and that he was to start entresto. Advised that if his wife should have any further questions to please call back and we will be happy to review any additional medications. He verbalized understanding with no further questions at this time.

## 2020-11-04 NOTE — Telephone Encounter (Signed)
This request has received a favorable outcome and is approved.   Please note any additional information provided by [name] at the bottom of your screen. You will also receive a faxed copy of the determination. If you have any questions please contact [name] at [phone].

## 2020-11-10 ENCOUNTER — Other Ambulatory Visit: Payer: Self-pay | Admitting: Family Medicine

## 2020-12-01 ENCOUNTER — Ambulatory Visit (INDEPENDENT_AMBULATORY_CARE_PROVIDER_SITE_OTHER): Payer: PPO

## 2020-12-01 DIAGNOSIS — I429 Cardiomyopathy, unspecified: Secondary | ICD-10-CM

## 2020-12-01 LAB — CUP PACEART REMOTE DEVICE CHECK
Battery Remaining Longevity: 83 mo
Battery Voltage: 2.99 V
Brady Statistic AP VP Percent: 0 %
Brady Statistic AP VS Percent: 0 %
Brady Statistic AS VP Percent: 1.8 %
Brady Statistic AS VS Percent: 98.2 %
Brady Statistic RA Percent Paced: 0 %
Brady Statistic RV Percent Paced: 4.44 %
Date Time Interrogation Session: 20220726063326
HighPow Impedance: 65 Ohm
Implantable Lead Implant Date: 20130502
Implantable Lead Implant Date: 20130502
Implantable Lead Location: 753859
Implantable Lead Location: 753860
Implantable Lead Model: 5076
Implantable Lead Model: 6935
Implantable Pulse Generator Implant Date: 20181031
Lead Channel Impedance Value: 380 Ohm
Lead Channel Impedance Value: 456 Ohm
Lead Channel Impedance Value: 570 Ohm
Lead Channel Pacing Threshold Amplitude: 0.75 V
Lead Channel Pacing Threshold Pulse Width: 0.4 ms
Lead Channel Sensing Intrinsic Amplitude: 6.5 mV
Lead Channel Sensing Intrinsic Amplitude: 6.5 mV
Lead Channel Setting Pacing Amplitude: 2.5 V
Lead Channel Setting Pacing Pulse Width: 0.4 ms
Lead Channel Setting Sensing Sensitivity: 0.3 mV

## 2020-12-22 ENCOUNTER — Telehealth: Payer: Self-pay | Admitting: Internal Medicine

## 2020-12-22 ENCOUNTER — Encounter: Payer: PPO | Admitting: Internal Medicine

## 2020-12-22 NOTE — Telephone Encounter (Signed)
Pt came by office today and mentioned that pt was unable to receive his shipment on time from [place]. Pt is completely out of Xarelto 20 mg tablet. I offered pt a free 30 day card to attempt to use and 1 bottle of Xarelto 20 mg. LOT [id number] EXP:11/24 Pt made aware that samples are only for new pt starts and we only are supplied limited samples.

## 2020-12-22 NOTE — Telephone Encounter (Signed)
Patient would like Xarelto samples. Patient is here in office

## 2020-12-28 NOTE — Progress Notes (Signed)
Remote ICD transmission.   

## 2020-12-31 ENCOUNTER — Encounter: Payer: Self-pay | Admitting: Nurse Practitioner

## 2020-12-31 ENCOUNTER — Ambulatory Visit: Payer: PPO | Admitting: Nurse Practitioner

## 2020-12-31 ENCOUNTER — Other Ambulatory Visit: Payer: Self-pay

## 2020-12-31 VITALS — BP 130/78 | HR 60 | Ht 66.5 in | Wt 210.1 lb

## 2020-12-31 DIAGNOSIS — I5022 Chronic systolic (congestive) heart failure: Secondary | ICD-10-CM | POA: Diagnosis not present

## 2020-12-31 DIAGNOSIS — I428 Other cardiomyopathies: Secondary | ICD-10-CM | POA: Diagnosis not present

## 2020-12-31 DIAGNOSIS — I2510 Atherosclerotic heart disease of native coronary artery without angina pectoris: Secondary | ICD-10-CM | POA: Diagnosis not present

## 2020-12-31 DIAGNOSIS — E782 Mixed hyperlipidemia: Secondary | ICD-10-CM

## 2020-12-31 DIAGNOSIS — I4821 Permanent atrial fibrillation: Secondary | ICD-10-CM

## 2020-12-31 DIAGNOSIS — Z79899 Other long term (current) drug therapy: Secondary | ICD-10-CM

## 2020-12-31 DIAGNOSIS — I10 Essential (primary) hypertension: Secondary | ICD-10-CM

## 2020-12-31 MED ORDER — LISINOPRIL 2.5 MG PO TABS: 2.5000 mg | ORAL_TABLET | Freq: Every day | ORAL | 3 refills | Status: DC

## 2020-12-31 NOTE — Patient Instructions (Addendum)
Medication Instructions:  - Your physician has recommended you make the following change in your medication:   1) RESUME Lisinopril 2.5 mg- take 1 tablet by mouth once daily   *If you need a refill on your cardiac medications before your next appointment, please call your pharmacy*   Lab Work: - Your physician recommends that you return for lab work in: 1-2 weeks- CMET/ Lipid  If you have labs (blood work) drawn today and your tests are completely normal, you will receive your results only by: [place] (if you have [place]) OR A paper copy in the mail If you have any lab test that is abnormal or we need to change your treatment, we will call you to review the results.   Testing/Procedures: - none ordered   Follow-Up: At [facility], you and your health needs are our priority.  As part of our continuing mission to provide you with exceptional heart care, we have created designated Provider Care Teams.  These Care Teams include your primary Cardiologist (physician) and Advanced Practice Providers (APPs -  Physician Assistants and Nurse Practitioners) who all work together to provide you with the care you need, when you need it.  We recommend signing up for the patient portal called "MyChart".  Sign up information is provided on this After Visit Summary.  MyChart is used to connect with patients for Virtual Visits (Telemedicine).  Patients are able to view lab/test results, encounter notes, upcoming appointments, etc.  Non-urgent messages can be sent to your provider as well.   To learn more about what you can do with MyChart, go to [email or web address].    Your next appointment:   4-6 month(s)  The format for your next appointment:   In Person  Provider:   Virl Axe, MD   Other Instructions N/a

## 2020-12-31 NOTE — Progress Notes (Signed)
Office Visit    Patient Name: Chris Price Date of Encounter: 12/31/2020  Primary Care Provider:  [name], MD Primary Cardiologist:  Virl Axe, MD  Chief Complaint    80 year old male with a history of nonobstructive CAD, HFrEF, nonischemic cardiomyopathy status post single lead ICD, permanent atrial fibrillation, left on a branch block, hypertension, hyperlipidemia, sleep apnea, degenerative disc disease, and GERD, who presents for follow-up related to CHF.  Past Medical History    Past Medical History:  Diagnosis Date   Chronic HFrEF (heart failure with reduced ejection fraction) ([place])    a. 08/2020 Echo: EF 30-35%, glob HK, mild LVH. Low nl RV fxn. Sev BAE. Mild MR. Mild-mod TR. Mild AI. Ao sclerosis.   Coronary artery disease 08/15/2011   a. 08/2011 Cath: Moderate nonobstructive CAD; b. 07/2013 Cath: LM nl, LAD 63m D1 50/771mLCX 5063mCA 30p, 50d-->Med rx; c. 08/2020 MV: EF 35%. No ischemia. Fixed apical defect.   Degenerative disk disease    lumbar   Disc herniation    GERD (gastroesophageal reflux disease)    Hyperlipidemia    Hypertension    ICD (implantable cardiac defibrillator) in place    a. 02/2017 gen change to MDT MRI compatible single lead device. Ser # [id number]   IVCD (intraventricular conduction defect)    Nonspecific QRS duration 122   Lumbar spinal stenosis    Neuromuscular disorder (HCC)    neauropathy   / siatica   NICM (nonischemic cardiomyopathy) ([facility]  a. 07/2011 Echo: EF 25-30%-->s/p ICD; b. 02/2017 s/p ICD gen change; c. 08/2020 Echo: EF 30-35%.   [facility]  Sleep apnea    uses cpap   Past Surgical History:  Procedure Laterality Date   C[facility]  [facility]    CARDIAC CATHE[facility]IZATION  07/09/2013   [facility]   CARD[facility]ERSION  08/2011   [facility]   CHOLECYSTECTOMY  April 2015   COLONOSCOPY WITH PROPOFOL N/A 07/30/2015   Procedure: COLONOSCOPY WITH PROPOFOL;  Surgeon: MarLollie SailsD;   [facility];  Service: Endoscopy;  Laterality: N/A;   cooled thermotherapy  [id number], complicated by incontinence   HERNIA REPAIR     ICD  09/08/2011   ICD GENERATOR CHANGEOUT N/A 03/08/2017   Procedure: ICD GENERATOR CHANGEOUT;  Surgeon: KleDeboraha SprangD;  Location: [facility];  Service: Cardiovascular;  Laterality: N/A;   IMPLANTABLE CARDIOVERTER DEFIBRILLATOR IMPLANT  09/08/2011   Procedure: IMPLANTABLE CARDIOVERTER DEFIBRILLATOR IMPLANT;  Surgeon: SteDeboraha SprangD;  [facility];  Service: Cardiovascular;;   LUMBAR LAMINECTOMY     L2 through S1 with wide decompression of the thecal sac and nerve roots.   PROSTATE BIOPSY  2013    Allergies  Allergies  Allergen Reactions   Coreg [Carvedilol] Other (See Comments)    hypotension   Flomax [Tamsulosin Hcl] Other (See Comments)    Hypotension    History of Present Illness    [age]ar old male with a history of nonobstructive CAD, HFrEF, nonischemic cardiomyopathy status post single lead ICD, permanent atrial fibrillation, left on a branch block, hypertension, hyperlipidemia, sleep apnea, degenerative disc disease, and GERD.  He previous underwent ICD placement in May 2013 with subsequent generator upgrade in October 2018.  Previous left heart catheterization in March 2015 showed moderate, nonobstructive disease with an EF of 35%.  Atrial fibrillation was previously managed with amiodarone however, he developed significant tremor which resulted in eventual  discontinuation of amiodarone and he has since been in permanent, rate controlled atrial fibrillation.  He has been anticoagulated with Xarelto.  In the setting of worsening dyspnea, an echocardiogram was performed in April of this year showing an EF of 30 to 35% with global hypokinesis.  Subsequently underwent stress testing which showed a small region of fixed defect in the apical region, EF of 35%, and no evidence of ischemia.  He was last seen in  cardiology clinic in June at which time his lisinopril was transition to [facility].  He was felt to be mildly volume overloaded at that time.  Since then, he has done quite well.  He stays pretty active and continues to do yard work.  He has dyspnea on exertion but overall is able to do quite a bit.  He has not been experiencing any chest pain.  He was unable to continue Entresto due to cost.  He has not yet gone back on lisinopril.  He denies palpitations, PND, orthopnea, dizziness, syncope, edema, or early satiety.  Home Medications    Current Outpatient Medications  Medication Sig Dispense Refill   bisoprolol (ZEBETA) 5 MG tablet TAKE 1 TABLET BY MOUTH 2 TIMES DAILY. 180 tablet 0   empagliflozin (JARDIANCE) 10 MG TABS tablet Take 1 tablet (10 mg total) by mouth daily before breakfast. 30 tablet 5   ezetimibe (ZETIA) 10 MG tablet Take 1 tablet (10 mg total) by mouth daily. 90 tablet 1   finasteride (PROSCAR) 5 MG tablet TAKE ONE TABLET BY MOUTH DAILY 90 tablet 3   furosemide (LASIX) 20 MG tablet Take by mouth. Take 2 tablets Tuesday and Wednesday , Off Thursday, Take 2 tablets Friday and Saturday, Off Sunday, Take Monday and Tuesday, off Wednesday as directed.     loratadine (CLARITIN) 10 MG tablet TAKE ONE TABLET BY MOUTH DAILY AS NEEDED FOR ALLERGIES 90 tablet 1   multivitamin (ONE-A-DAY MEN'S) TABS tablet Take 1 tablet by mouth daily.     nitroGLYCERIN (NITROSTAT) 0.4 MG SL tablet Place 1 tablet (0.4 mg total) under the tongue every 5 (five) minutes as needed for chest pain. 25 tablet 1   omeprazole (PRILOSEC) 40 MG capsule Take 1 capsule (40 mg total) by mouth daily before breakfast. 90 capsule 1   rivaroxaban (XARELTO) 20 MG TABS tablet TAKE ONE TABLET BY MOUTH DAILY WITH SUPPER 90 tablet 1   rosuvastatin (CRESTOR) 40 MG tablet Take 1 tablet (40 mg total) by mouth daily. 90 tablet 1   traZODone (DESYREL) 100 MG tablet Take 1 tablet (100 mg total) by mouth at bedtime. 90 tablet 1    triamcinolone ointment (KENALOG) 0.1 % Apply topically.     vitamin C (ASCORBIC ACID) 500 MG tablet Take 500 mg by mouth daily.     sacubitril-valsartan (ENTRESTO) 24-26 MG Take 1 tablet by mouth 2 (two) times daily. (Patient not taking: Reported on 12/31/2020) 60 tablet 5   No current facility-administered medications for this visit.     Review of Systems    Chronic dyspnea on exertion which is overall stable and actually he notes reasonably good activity tolerance.  He denies chest pain, palpitations, PND, orthopnea, dizziness, syncope, edema, or early satiety.  All other systems reviewed and are otherwise negative except as noted above.  Physical Exam    VS:  BP 130/78 (BP Location: Left Arm, Patient Position: Sitting, Cuff Size: Normal)   Pulse 60   Ht 5' 6.5" (1.689 m)   Wt 210 lb 2 oz (  95.3 kg)   SpO2 98%   BMI 33.41 kg/m  , BMI Body mass index is 33.41 kg/m.     GEN: Well nourished, well developed, in no acute distress. HEENT: normal. Neck: Supple, no JVD, carotid bruits, or masses. Cardiac: IR, IR, no murmurs, rubs, or gallops. No clubbing, cyanosis.  RLE varicosities w/ trace RLE edema.  Radials 2+/PT 1+ and equal bilaterally.  Respiratory:  Respirations regular and unlabored, clear to auscultation bilaterally. GI: Soft, nontender, nondistended, BS + x 4. MS: no deformity or atrophy. Skin: warm and dry, no rash. Neuro:  Strength and sensation are intact. Psych: Normal affect.  Accessory Clinical Findings    ECG personally reviewed by me today -atrial fibrillation, 60, PVC, left axis deviation, left bundle branch block- no acute changes.  Lab Results  Component Value Date   WBC 6.4 08/21/2020   HGB 14.4 08/21/2020   HCT 43.1 08/21/2020   MCV 95.4 08/21/2020   PLT 157 08/21/2020   Lab Results  Component Value Date   CREATININE 1.01 08/21/2020   BUN 20 08/21/2020   NA 140 08/21/2020   K 4.1 08/21/2020   CL 105 08/21/2020   CO2 26 08/21/2020   Lab Results   Component Value Date   ALT 37 07/31/2019   AST 37 (H) 07/31/2019   ALKPHOS 50 06/29/2017   BILITOT 1.1 07/31/2019   Lab Results  Component Value Date   CHOL 143 07/31/2019   HDL 44 07/31/2019   LDLCALC 80 07/31/2019   LDLDIRECT 143.6 03/21/2012   TRIG 106 07/31/2019   CHOLHDL 3.3 07/31/2019    Lab Results  Component Value Date   HGBA1C 5.7 (H) 07/31/2019    Assessment & Plan    1.  HFrEF/nonischemic cardiomyopathy: EF 30 to 35% by echo in April.  Euvolemic on examination and doing reasonably well.  He notes chronic dyspnea on exertion, which has been stable.  He still has pretty good activity tolerance and does quite a bit around his house and yard.  He was unable to afford Entresto.  As result, I will resume lisinopril 2.5 mg daily.  He remains on beta-blocker and empagliflozin.  He is taking Lasix a few days a week.  Follow-up basic metabolic panel next week in the setting of resuming lisinopril.  2.  Permanent atrial fibrillation: This is rate controlled at 60 bpm on beta-blocker therapy.  He is anticoagulated with Xarelto.  3.  Essential hypertension: Blood pressure 130/78 today.  He remains on bisoprolol therapy.  As he is not taking Entresto, I am going to add back lisinopril 2.5 mg which was his previous dose.  Follow-up basic metabolic panel in a week to ensure stability.  4.  Hyperlipidemia:  LDL of 80 in March 2021.  He remains on Zetia and Crestor.  5.  Disposition: Follow-up in clinic in 6 months or sooner if necessary.     Murray Hodgkins, NP 12/31/2020, 8:10 AM

## 2021-01-08 ENCOUNTER — Other Ambulatory Visit (INDEPENDENT_AMBULATORY_CARE_PROVIDER_SITE_OTHER): Payer: PPO | Admitting: *Deleted

## 2021-01-08 ENCOUNTER — Other Ambulatory Visit: Payer: Self-pay

## 2021-01-08 DIAGNOSIS — Z79899 Other long term (current) drug therapy: Secondary | ICD-10-CM

## 2021-01-08 DIAGNOSIS — E782 Mixed hyperlipidemia: Secondary | ICD-10-CM

## 2021-01-08 DIAGNOSIS — I10 Essential (primary) hypertension: Secondary | ICD-10-CM | POA: Diagnosis not present

## 2021-01-08 DIAGNOSIS — I4821 Permanent atrial fibrillation: Secondary | ICD-10-CM | POA: Diagnosis not present

## 2021-01-08 DIAGNOSIS — I2510 Atherosclerotic heart disease of native coronary artery without angina pectoris: Secondary | ICD-10-CM | POA: Diagnosis not present

## 2021-01-08 DIAGNOSIS — I428 Other cardiomyopathies: Secondary | ICD-10-CM

## 2021-01-08 DIAGNOSIS — I5022 Chronic systolic (congestive) heart failure: Secondary | ICD-10-CM

## 2021-01-09 LAB — LIPID PANEL
Chol/HDL Ratio: 3.4 ratio (ref 0.0–5.0)
Cholesterol, Total: 153 mg/dL (ref 100–199)
HDL: 45 mg/dL (ref >39)
LDL Chol Calc (NIH): 91 mg/dL (ref 0–99)
Triglycerides: 93 mg/dL (ref 0–149)
VLDL Cholesterol Cal: 17 mg/dL (ref 5–40)

## 2021-01-09 LAB — COMPREHENSIVE METABOLIC PANEL
ALT: 33 IU/L (ref 0–44)
AST: 38 IU/L (ref 0–40)
Albumin/Globulin Ratio: 1.4 (ref 1.2–2.2)
Albumin: 4.2 g/dL (ref 3.6–4.6)
Alkaline Phosphatase: 55 IU/L (ref 44–121)
BUN/Creatinine Ratio: 16 (ref 10–24)
BUN: 16 mg/dL (ref 8–27)
Bilirubin Total: 1.1 mg/dL (ref 0.0–1.2)
CO2: 22 mmol/L (ref 20–29)
Calcium: 9.1 mg/dL (ref 8.6–10.2)
Chloride: 104 mmol/L (ref 96–106)
Creatinine, Ser: 1.02 mg/dL (ref 0.76–1.27)
Globulin, Total: 3 g/dL (ref 1.5–4.5)
Glucose: 87 mg/dL (ref 65–99)
Potassium: 4.4 mmol/L (ref 3.5–5.2)
Sodium: 142 mmol/L (ref 134–144)
Total Protein: 7.2 g/dL (ref 6.0–8.5)
eGFR: 74 mL/min/{1.73_m2} (ref >59)

## 2021-01-11 ENCOUNTER — Encounter: Payer: Self-pay | Admitting: Nurse Practitioner

## 2021-01-12 ENCOUNTER — Other Ambulatory Visit: Payer: Self-pay | Admitting: Family Medicine

## 2021-01-21 ENCOUNTER — Other Ambulatory Visit: Payer: Self-pay | Admitting: Family Medicine

## 2021-01-22 ENCOUNTER — Other Ambulatory Visit: Payer: Self-pay | Admitting: Family

## 2021-01-22 NOTE — Telephone Encounter (Signed)
Rx(s) sent to pharmacy electronically.  

## 2021-01-25 DIAGNOSIS — C44519 Basal cell carcinoma of skin of other part of trunk: Secondary | ICD-10-CM | POA: Diagnosis not present

## 2021-01-25 DIAGNOSIS — L570 Actinic keratosis: Secondary | ICD-10-CM | POA: Diagnosis not present

## 2021-01-25 DIAGNOSIS — X32XXXA Exposure to sunlight, initial encounter: Secondary | ICD-10-CM | POA: Diagnosis not present

## 2021-01-25 DIAGNOSIS — Z85828 Personal history of other malignant neoplasm of skin: Secondary | ICD-10-CM | POA: Diagnosis not present

## 2021-01-25 DIAGNOSIS — D2261 Melanocytic nevi of right upper limb, including shoulder: Secondary | ICD-10-CM | POA: Diagnosis not present

## 2021-01-25 DIAGNOSIS — D225 Melanocytic nevi of trunk: Secondary | ICD-10-CM | POA: Diagnosis not present

## 2021-01-25 DIAGNOSIS — C44529 Squamous cell carcinoma of skin of other part of trunk: Secondary | ICD-10-CM | POA: Diagnosis not present

## 2021-01-25 DIAGNOSIS — D485 Neoplasm of uncertain behavior of skin: Secondary | ICD-10-CM | POA: Diagnosis not present

## 2021-01-25 DIAGNOSIS — D2262 Melanocytic nevi of left upper limb, including shoulder: Secondary | ICD-10-CM | POA: Diagnosis not present

## 2021-01-25 DIAGNOSIS — D0461 Carcinoma in situ of skin of right upper limb, including shoulder: Secondary | ICD-10-CM | POA: Diagnosis not present

## 2021-02-01 ENCOUNTER — Telehealth: Payer: Self-pay | Admitting: *Deleted

## 2021-02-01 NOTE — Telephone Encounter (Signed)
Left voicemail message to call back for review of results.  

## 2021-02-01 NOTE — Telephone Encounter (Signed)
-----   Message from Theora Gianotti, NP sent at 01/11/2021  8:13 AM EDT ----- Kidney function, liver function, electrolytes, and lipids all look good.

## 2021-02-02 ENCOUNTER — Ambulatory Visit: Payer: PPO | Admitting: Family Medicine

## 2021-02-03 NOTE — Telephone Encounter (Signed)
Reviewed results with patient and he verbalized understanding with no further questions at this time. 

## 2021-02-08 ENCOUNTER — Other Ambulatory Visit: Payer: Self-pay

## 2021-02-08 ENCOUNTER — Ambulatory Visit (INDEPENDENT_AMBULATORY_CARE_PROVIDER_SITE_OTHER): Payer: PPO | Admitting: Family Medicine

## 2021-02-08 ENCOUNTER — Encounter: Payer: Self-pay | Admitting: Family Medicine

## 2021-02-08 VITALS — BP 116/72 | HR 85 | Temp 98.0°F | Resp 16 | Ht 67.0 in | Wt 213.0 lb

## 2021-02-08 DIAGNOSIS — M545 Low back pain, unspecified: Secondary | ICD-10-CM

## 2021-02-08 DIAGNOSIS — I482 Chronic atrial fibrillation, unspecified: Secondary | ICD-10-CM | POA: Diagnosis not present

## 2021-02-08 DIAGNOSIS — R7303 Prediabetes: Secondary | ICD-10-CM | POA: Diagnosis not present

## 2021-02-08 DIAGNOSIS — I10 Essential (primary) hypertension: Secondary | ICD-10-CM

## 2021-02-08 DIAGNOSIS — I428 Other cardiomyopathies: Secondary | ICD-10-CM

## 2021-02-08 DIAGNOSIS — K219 Gastro-esophageal reflux disease without esophagitis: Secondary | ICD-10-CM

## 2021-02-08 DIAGNOSIS — M7062 Trochanteric bursitis, left hip: Secondary | ICD-10-CM

## 2021-02-08 DIAGNOSIS — Z23 Encounter for immunization: Secondary | ICD-10-CM | POA: Diagnosis not present

## 2021-02-08 DIAGNOSIS — G4733 Obstructive sleep apnea (adult) (pediatric): Secondary | ICD-10-CM | POA: Diagnosis not present

## 2021-02-08 DIAGNOSIS — J410 Simple chronic bronchitis: Secondary | ICD-10-CM

## 2021-02-08 DIAGNOSIS — F5101 Primary insomnia: Secondary | ICD-10-CM

## 2021-02-08 DIAGNOSIS — E785 Hyperlipidemia, unspecified: Secondary | ICD-10-CM

## 2021-02-08 DIAGNOSIS — D692 Other nonthrombocytopenic purpura: Secondary | ICD-10-CM

## 2021-02-08 DIAGNOSIS — I5022 Chronic systolic (congestive) heart failure: Secondary | ICD-10-CM

## 2021-02-08 DIAGNOSIS — I2583 Coronary atherosclerosis due to lipid rich plaque: Secondary | ICD-10-CM

## 2021-02-08 DIAGNOSIS — G250 Essential tremor: Secondary | ICD-10-CM

## 2021-02-08 DIAGNOSIS — Z9989 Dependence on other enabling machines and devices: Secondary | ICD-10-CM

## 2021-02-08 DIAGNOSIS — I2510 Atherosclerotic heart disease of native coronary artery without angina pectoris: Secondary | ICD-10-CM

## 2021-02-08 DIAGNOSIS — G8929 Other chronic pain: Secondary | ICD-10-CM

## 2021-02-08 MED ORDER — ROSUVASTATIN CALCIUM 40 MG PO TABS
40.0000 mg | ORAL_TABLET | Freq: Every day | ORAL | 1 refills | Status: DC
Start: 2021-02-08 — End: 2021-08-09

## 2021-02-08 MED ORDER — TRIAMCINOLONE ACETONIDE 40 MG/ML IJ SUSP
40.0000 mg | Freq: Once | INTRAMUSCULAR | Status: AC
  Administered 2021-02-08: 40 mg via INTRAMUSCULAR

## 2021-02-08 MED ORDER — BISOPROLOL FUMARATE 5 MG PO TABS: 5.0000 mg | ORAL_TABLET | Freq: Two times a day (BID) | ORAL | 1 refills | Status: DC

## 2021-02-08 MED ORDER — EZETIMIBE 10 MG PO TABS: 10.0000 mg | ORAL_TABLET | Freq: Every day | ORAL | 1 refills | Status: DC

## 2021-02-08 MED ORDER — QUETIAPINE FUMARATE 25 MG PO TABS: 25.0000 mg | ORAL_TABLET | Freq: Every day | ORAL | 1 refills | Status: DC

## 2021-02-08 NOTE — Progress Notes (Signed)
Name: Chris Price   MRN: [id number]    DOB: 01/16/1941   Date:02/08/2021       Progress Note  Subjective  Chief Complaint  Follow Up  HPI  Chronic afib: he sees Dr. Clide Cliff, no palpitation or SOB, he has a pacemaker , he is on beta-blocker and back on lisinopril    Chronic bronchitis: : he never smoked, second hand smoking as a child - both parents smoked - He has chronic cough that is productive and clear to white,  no wheezing, he has sob during activity but stable and likely multifactorial . Discussed medication and he would like to hold off due to cost    CHF :He has mild lower extremity edema.  He sees cardiologist, Dr. Graciela Husbands but not seen in a while. 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    OSA/CPAP: he is very compliant with CPAP, he wakes up feeling refreshed in the mornings.    Pre-diabetes: last hgbA1C was slightly lower at 5.7 % , he likes ice cream but only twice a week, denies polyphagia, polydipsia or polyuria. He would like to hold off on rechecking A1C    HTN: bp at home has been slightly lower in the  One hundred and teens but not feeling dizzy lately, usually close to 120/80 He   denies  dizziness, denies chest pain or palpitation   Paresthesia both feet: going on since 2007 after back surgery - Dr. Sharolyn Douglas, but getting progressively worse. Last b12 level was normal, discussed gabapentin but he does not want it at this time, since no pain, he only has  Numbness. Stable.    Insomnia: he is currently taking  Trazodone 100 mg and melatonin  and is working well for him, however it causes vivid and would like to try something else, we will try seroquel since it works for his wife.    Tremors hands: going on for years, but worse since 2019 , seen at [facility] , he had a second opinion done at [facility] neurology. Reviewed the notes . No symptoms of Parkinson's advised to continue bizoprolol and advised to avoid Mysoline since it can cause more side effects . Doing well   Hearing loss: he sees ENT and is now wearing a hearing aid . Unchanged   BPH with LUTS: seeing Urologist , seeing Dr. Apolinar Junes yearly now and is doing well , nocturia once per night  Unchanged   Senile purpura: both arms, reassurance given . Unchanged   Morbid obesity: BMI above 35 with co-morbidities, continue regular physical activity and balanced diet   Trochanteric bursitis: seen by Ortho in 2019, had steroid injection and PT and improved significantly, however when he stopped going to the pool and doing more physical activity that bothers him also. Cannot sleep on left side   Patient Active Problem List   Diagnosis Date Noted   Permanent atrial fibrillation (HCC) 12/31/2020   Senile purpura (HCC) 01/31/2020  Primary insomnia 01/31/2020   Hearing loss, sensorineural 09/12/2017   Prediabetes 07/01/2017   Hx of colonic polyps 06/19/2016   Long-term use of high-risk medication 06/19/2016   Cardiomyopathy ([place]) 07/14/2015   Low back pain due to displacement of intervertebral disc 04/09/2015   Diarrhea following gastrointestinal surgery 09/04/2013   S/P laparoscopic cholecystectomy 08/20/2013   Chronic atrial fibrillation ([place]) 07/15/2013   Morbid obesity ([place]) 02/28/2013   OSA on CPAP 01/03/2013   Hyperlipidemia with target low density lipoprotein (LDL) cholesterol less than 70 mg/dL 03/22/2012   ED (erectile dysfunction) of organic origin 01/23/2012   Benign localized prostatic hyperplasia with lower urinary tract  symptoms (LUTS) 01/23/2012   Bladder outlet obstruction 12/25/2011   Essential tremor 12/22/2011   Hypertension    Elevated PSA, less than 10 ng/ml 09/20/2011   Automatic implantable cardioverter-defibrillator Medtronic dual-chamber 57/84/6962   Chronic systolic heart failure ([place]) 08/25/2011   IVCD (intraventricular conduction defect)    Coronary artery disease 08/15/2011    Past Surgical History:  Procedure Laterality Date   CARDIAC CATHETERIZATION  [facility]15/2011   [facility]    CARDIAC CATHETERIZATION  07/09/2013   [facility]   CARDIOVERSION  08/2011   [facility]   CHOLECYSTECTOMY  April 2015   COLONOSCOPY WITH PROPOFOL N/A 07/30/2015   Procedure: COLONOSCOPY WITH PROPOFOL;  Surgeon: Lollie Sails, MD;  Location: [facility] ENDOSCOPY;  Service: Endoscopy;  Laterality: N/A;   cooled thermotherapy  9528   [place], complicated by incontinence   HERNIA REPAIR     ICD  09/08/2011   ICD GENERATOR CHANGEOUT N/A 03/08/2017   Procedure: ICD GENERATOR CHANGEOUT;  Surgeon: [name], MD;  Location: [place] CV LAB;  Service: Cardiovascular;  Laterality: N/A;   IMPLANTABLE CARDIOVERTER DEFIBRILLATOR IMPLANT  09/08/2011   Procedure: IMPLANTABLE CARDIOVERTER DEFIBRILLATOR IMPLANT;  Surgeon: [name], MD;  Location[facility] CATH LAB;  Service: Cardiovascular;;   LUMBAR LAMINECTOMY     L2 through S1 with wide decompression of the thecal sac and nerve roots.   PROSTATE BIOPSY  2013    Family History  Problem Relation Age of Onset   Heart attack Father    Heart attack Brother    Heart attack Son    Ovarian cancer Daughter     Social History   Tobacco Use   Smoking status: Never   Smokeless tobacco: Never  Substance Use Topics   Alcohol use: No     Current Outpatient Medications:    bisoprolol (ZEBETA) 5 MG tablet, TAKE 1 TABLET BY MOUTH 2 TIMES DAILY., Disp: 180 tablet, Rfl: 0   ezetimibe (ZETIA) 10 MG tablet, Take 1 tablet (10 mg total) by mouth daily., Disp: 90 tablet, Rfl: 1   finasteride  (PROSCAR) 5 MG tablet, TAKE ONE TABLET BY MOUTH DAILY, Disp: 90 tablet, Rfl: 3   furosemide (LASIX) 20 MG tablet, Take by mouth. Take 2 tablets Tuesday and Wednesday , Off Thursday, Take 2 tablets Friday and Saturday, Off Sunday, Take Monday and Tuesday, off Wednesday as directed., Disp: , Rfl:    JARDIANCE 10 MG TABS tablet, TAKE ONE TABLET BY MOUTH EVERY DAY BEFORE BREAKFAST, Disp: 90 tablet, Rfl: 3   lisinopril (ZESTRIL) 2.5 MG tablet, Take 1 tablet (2.5 mg total) by mouth daily., Disp: 90 tablet, Rfl: 3   loratadine (CLARITIN) 10 MG tablet, TAKE ONE TABLET BY MOUTH DAILY AS NEEDED FOR ALLERGIES, Disp: 90 tablet, Rfl: 1   multivitamin (ONE-A-DAY MEN'S) TABS tablet, Take 1 tablet by mouth daily., Disp: , Rfl:  nitroGLYCERIN (NITROSTAT) 0.4 MG SL tablet, Place 1 tablet (0.4 mg total) under the tongue every 5 (five) minutes as needed for chest pain., Disp: 25 tablet, Rfl: 1   omeprazole (PRILOSEC) 40 MG capsule, Take 1 capsule (40 mg total) by mouth daily before breakfast., Disp: 90 capsule, Rfl: 1   rivaroxaban (XARELTO) 20 MG TABS tablet, TAKE ONE TABLET BY MOUTH DAILY WITH SUPPER, Disp: 90 tablet, Rfl: 1   rosuvastatin (CRESTOR) 40 MG tablet, Take 1 tablet (40 mg total) by mouth daily., Disp: 90 tablet, Rfl: 1   traZODone (DESYREL) 100 MG tablet, Take 1 tablet (100 mg total) by mouth at bedtime., Disp: 90 tablet, Rfl: 1   triamcinolone ointment (KENALOG) 0.1 %, Apply topically., Disp: , Rfl:    vitamin C (ASCORBIC ACID) 500 MG tablet, Take 500 mg by mouth daily., Disp: , Rfl:   Allergies  Allergen Reactions   Coreg [Carvedilol] Other (See Comments)    hypotension   Flomax [Tamsulosin Hcl] Other (See Comments)    Hypotension    I personally reviewed active problem list, medication list, allergies, family history, social history, health maintenance with the patient/caregiver today.   ROS  Constitutional: Negative for fever or weight change.  Respiratory: Negative for cough and  shortness of breath.   Cardiovascular: Negative for chest pain or palpitations.  Gastrointestinal: Negative for abdominal pain, no bowel changes.  Musculoskeletal: positive for gait problem but no  joint swelling.  Skin: Negative for rash.  Neurological: Negative for dizziness or headache.  No other specific complaints in a complete review of systems (except as listed in HPI above).   Objective  Vitals:   02/08/21 1431  BP: 116/72  Pulse: 85  Resp: 16  Temp: 98 F (36.7 C)  SpO2: 97%  Weight: 213 lb (96.6 kg)  Height: $Remove'5\' 7"'OiHFzOw$  (1.702 m)    Body mass index is 33.36 kg/m.  Physical Exam  Constitutional: Patient appears well-developed and well-nourished. Obese  No distress.  HEENT: head atraumatic, normocephalic, pupils equal and reactive to light, neck supple Cardiovascular: Normal rate, regular rhythm and normal heart sounds.  No murmur heard. No BLE edema. Pulmonary/Chest: Effort normal and breath sounds normal. No respiratory distress. Abdominal: Soft.  There is no tenderness. Psychiatric: Patient has a normal mood and affect. behavior is normal. Judgment and thought content normal.   Recent Results (from the past 2160 hour(s))  CUP PACEART REMOTE DEVICE CHECK     Status: None   Collection Time: 12/01/20  6:33 AM  Result Value Ref Range   Date Time Interrogation Session [id number]    Pulse Generator Manufacturer MERM    Pulse Gen Model [id number] Evera MRI XT DR    Pulse Gen Serial Number [id number]    Clinic Name [facility]    Implantable Pulse Generator Type Implantable Cardiac Defibulator    Implantable Pulse Generator Implant Date 30131438    Implantable Lead Manufacturer [facility]    Implantable Lead Model 5076 CapSureFix Novus    Implantable Lead Serial Number [id number]    Implantable Lead Implant Date 88757972    Implantable Lead Location Detail 1 APPENDAGE    Implantable Lead Location [id number]    Implantable Lead Manufacturer [employer]    Implantable Lead Model  6935 Sprint Quattro Secure S    Implantable Lead Serial Number [id number]    Implantable Lead Implant Date 82060156    Implantable Lead Location Detail 1 APEX    Implantable Lead Location [id number]    Lead Channel Setting Sensing  Sensitivity 0.3 mV   Lead Channel Setting Pacing Pulse Width 0.4 ms   Lead Channel Setting Pacing Amplitude 2.5 V   Lead Channel Impedance Value 456 ohm   Lead Channel Impedance Value 570 ohm   Lead Channel Impedance Value 380 ohm   Lead Channel Sensing Intrinsic Amplitude 6.5 mV   Lead Channel Sensing Intrinsic Amplitude 6.5 mV   Lead Channel Pacing Threshold Amplitude 0.75 V   Lead Channel Pacing Threshold Pulse Width 0.4 ms   HighPow Impedance 65 ohm   Battery Status OK    Battery Remaining Longevity 83 mo   Battery Voltage 2.99 V   Brady Statistic RA Percent Paced 0 %   Brady Statistic RV Percent Paced 4.44 %   Brady Statistic AP VP Percent 0 %   Brady Statistic AS VP Percent 1.8 %   Brady Statistic AP VS Percent 0 %   Brady Statistic AS VS Percent 98.2 %  Comp Met (CMET)     Status: None   Collection Time: 01/08/21  9:46 AM  Result Value Ref Range   Glucose 87 65 - 99 mg/dL   BUN 16 8 - 27 mg/dL   Creatinine, Ser 1.02 0.76 - 1.27 mg/dL   eGFR 74 >59 mL/min/1.73   BUN/Creatinine Ratio 16 10 - 24   Sodium 142 134 - 144 mmol/L   Potassium 4.4 3.5 - 5.2 mmol/L   Chloride 104 96 - 106 mmol/L   CO2 22 20 - 29 mmol/L   Calcium 9.1 8.6 - 10.2 mg/dL   Total Protein 7.2 6.0 - 8.5 g/dL   Albumin 4.2 3.6 - 4.6 g/dL   Globulin, Total 3.0 1.5 - 4.5 g/dL   Albumin/Globulin Ratio 1.4 1.2 - 2.2   Bilirubin Total 1.1 0.0 - 1.2 mg/dL   Alkaline Phosphatase 55 44 - 121 IU/L   AST 38 0 - 40 IU/L   ALT 33 0 - 44 IU/L  Lipid Profile     Status: None   Collection Time: 01/08/21  9:46 AM  Result Value Ref Range   Cholesterol, Total 153 100 - 199 mg/dL   Triglycerides 93 0 - 149 mg/dL   HDL 45 >39 mg/dL   VLDL Cholesterol Cal 17 5 - 40 mg/dL   LDL Chol Calc  (NIH) 91 0 - 99 mg/dL   Chol/HDL Ratio 3.4 0.0 - 5.0 ratio    Comment:                                   T. Chol/HDL Ratio                                             Men  Women                               1/2 Avg.Risk  3.4    3.3                                   Avg.Risk  5.0    4.4  2X Avg.Risk  9.6    7.1                                3X Avg.Risk 23.4   11.0      PHQ2/9: Depression screen [facility] 2/9 02/08/2021 07/30/2020 06/09/2020 01/31/2020 07/31/2019  Decreased Interest 0 0 0 0 0  Down, Depressed, Hopeless 0 0 0 0 0  PHQ - 2 Score 0 0 0 0 0  Altered sleeping - - - - 0  Tired, decreased energy - - - - 0  Change in appetite - - - - 0  Feeling bad or failure about yourself  - - - - 0  Trouble concentrating - - - - 0  Moving slowly or fidgety/restless - - - - 0  Suicidal thoughts - - - - 0  PHQ-9 Score - - - - 0  Difficult doing work/chores - - - - -    phq 9 is negative   Fall Risk: Fall Risk  02/08/2021 07/30/2020 06/09/2020 01/31/2020 07/31/2019  Falls in the past year? 0 0 0 0 0  Number falls in past yr: 0 0 0 0 0  Injury with Fall? 0 0 0 0 0  Risk for fall due to : No Fall Risks - No Fall Risks - -  Follow up Falls prevention discussed - Falls prevention discussed - -      Functional Status Survey: Is the patient deaf or have difficulty hearing?: Yes Does the patient have difficulty seeing, even when wearing glasses/contacts?: No Does the patient have difficulty concentrating, remembering, or making decisions?: No Does the patient have difficulty walking or climbing stairs?: No Does the patient have difficulty dressing or bathing?: No Does the patient have difficulty doing errands alone such as visiting a doctor's office or shopping?: No    Assessment & Plan  1. Simple chronic bronchitis ([place])  He continues to cough - discussed inhalers without beta agonist, however too costly, and we will try to get Alex to help [place] out. I would advised  and anti-cholinergic.   2. Morbid obesity ([place])  Discussed with the patient the risk posed by an increased BMI. Discussed importance of portion control, calorie counting and at least 150 minutes of physical activity weekly. Avoid sweet beverages and drink more water. Eat at least 6 servings of fruit and vegetables daily    3. Senile purpura (HCC)  Stable.  4. Need for immunization against influenza  - Flu Vaccine QUAD High Dose(Fluad)  5. Chronic left-sided low back pain without sciatica   6. Chronic systolic heart failure (HCC)  - bisoprolol (ZEBETA) 5 MG tablet; Take 1 tablet (5 mg total) by mouth 2 (two) times daily.  Dispense: 180 tablet; Refill: 1  7. Chronic atrial fibrillation (HCC)  - bisoprolol (ZEBETA) 5 MG tablet; Take 1 tablet (5 mg total) by mouth 2 (two) times daily.  Dispense: 180 tablet; Refill: 1 - AMB Referral to [place]  8. OSA on CPAP   9. Gastroesophageal reflux disease without esophagitis   10. Essential tremor   11. Essential hypertension  At goal   12. Prediabetes   13. Other cardiomyopathy ([place])  - AMB Referral to [place]  14. Dyslipidemia  - ezetimibe (ZETIA) 10 MG tablet; Take 1 tablet (10 mg total) by mouth daily.  Dispense: 90 tablet; Refill: 1 - rosuvastatin (CRESTOR) 40 MG tablet; Take 1 tablet (40 mg total)  by mouth daily.  Dispense: 90 tablet; Refill: 1  15. Primary insomnia  - QUEtiapine (SEROQUEL) 25 MG tablet; Take 1 tablet (25 mg total) by mouth at bedtime.  Dispense: 90 tablet; Refill: 1  16. Coronary artery disease due to lipid rich plaque  - ezetimibe (ZETIA) 10 MG tablet; Take 1 tablet (10 mg total) by mouth daily.  Dispense: 90 tablet; Refill: 1 - rosuvastatin (CRESTOR) 40 MG tablet; Take 1 tablet (40 mg total) by mouth daily.  Dispense: 90 tablet; Refill: 1 - AMB Referral to [place]   17. Trochanteric bursitis of left hip  Consent form signed Localized  bursa left hip  Area prepped with alcohol  Injection with lidocaine 1% and Kenalog $RemoveBef'40mg'vfbnXhwpXP$ /1 ml on bursa sack Patient tolerated procedure well No side effects

## 2021-02-09 ENCOUNTER — Telehealth: Payer: Self-pay | Admitting: *Deleted

## 2021-02-09 NOTE — Chronic Care Management (AMB) (Signed)
  Chronic Care Management   Note  02/09/2021 Name: IMRE VECCHIONE MRN: [id number] DOB: [birth year]  AZAVIER CRESON is a 80 y.o. year old male who is a primary care patient of [name], MD. I reached out to [name] by phone today in response to a referral sent by Mr. Kris Hartmann O'Brien's PCP.  Mr. Schlageter was given information about Chronic Care Management services today including:  CCM service includes personalized support from designated clinical staff supervised by his physician, including individualized plan of care and coordination with other care providers 24/7 contact phone numbers for assistance for urgent and routine care needs. Service will only be billed when office clinical staff spend 20 minutes or more in a month to coordinate care. Only one practitioner may furnish and bill the service in a calendar month. The patient may stop CCM services at any time (effective at the end of the month) by phone call to the office staff. The patient is responsible for co-pay (up to 20% after annual deductible is met) if co-pay is required by the individual health plan.   Patient agreed to services and verbal consent obtained.   Follow up plan: Telephone appointment with care management team member scheduled for: 02/17/2021  Julian Hy, [place] Management  Direct Dial: [phone]

## 2021-02-12 ENCOUNTER — Telehealth: Payer: Self-pay

## 2021-02-12 NOTE — Progress Notes (Signed)
Chronic Care Management Pharmacy Assistant   Name: CHIBUIKEM THANG  MRN: [id number] DOB: [birth year]  Initial Questions for Initial Visit with Junius Argyle, CPP on 02/25/2021 @ 1300  Conditions to be addressed/monitored: HTN and IVCD, Chronic Systolic Heart Failure, Coronary Artery Disease, Chronic atrial Fibrillation, Cardiomyopathy, Senile Purpura, Permanent Atrial Fibrillation, Benign localized prostatic hyperplasia with lower urinary tract symptoms, HLD with target low density lipoprotein (LDL) Cholesterol less than 70 mg/dl, Morbid Obesity, Primary Insomnia   Primary concerns for visit include: There is a couple of  medications the patient is having a hard time affording, but he is unsure of everything at this time because his spouse handles this for him and she wasn't home. Patient did request to reschedule his visit with Cristie Hem as his wife has to work Wednesday and he would like her to be there to assist him with the visit. I was able to get the patient rescheduled for 03/03/2021 @ 0900.   Recent office visits:  02/08/2021 [name], MD (PCP Office Visit) for Follow-up- Started: Quetiapine Fumarate 25 mg daily Discontinued: Trazodone HCl 100 mg; Patient instructed to return in 6 months.   Recent consult visits:  12/31/2020 Theora Gianotti, NP (Cardiology) for Follow-up- Discontinued: Delene Loll due to cost Started: Lisinopril 2.5 mg daily, labs ordered, EKG-12 lead place  11/03/2020 Virl Axe, MD (Cardiology) for Follow-up- Patient instructed to Increase Lasix to 40 mg daily for 5 days then return to normal dosage; Discontinued: Lisinopril 2.5 mg; Stated: Sacubitril-Valsartan 24-26 mg 1 tablet twice daily; Patient instructed to follow-up in 8 weeks  08/31/2020 Dell Ponto, NP (Cardiology) for Follow-up- Started: Farxiga 10 mg daily; Changed how patient takes Furosemide 20 mg Tabs Take 2 tablets Tuesday and Wednesday, Off Thursday, Take 2 Tablets Friday and Saturday off  Sunday, Take Monday and Tuesday, off Wednesday as directed. Patient instructed to follow-up in 2 months  08/21/2020 Christell Faith, PA-C (Cardiology) for HFrEF- No medication changes noted, lab order placed; NM Myocar Multi W/Spect W/Wall Motion/EF placed; EKG 12-Lead order placed; patient instructed to follow-up in 1-2 weeks  08/17/2020 Virl Axe, MD (Cardiology) for Chest pain- No medication changes noted; Echocardiogram Complete ordered, EKG 12-Lead ordered, Pacemaker external ordered, Cardiac Stress test ordered; Myocardial Perfusion Imaging ordered; Patient to follow-up in August    Hospital visits:  None in previous 6 months  Medications: Outpatient Encounter Medications as of 02/12/2021  Medication Sig   bisoprolol (ZEBETA) 5 MG tablet Take 1 tablet (5 mg total) by mouth 2 (two) times daily.   ezetimibe (ZETIA) 10 MG tablet Take 1 tablet (10 mg total) by mouth daily.   finasteride (PROSCAR) 5 MG tablet TAKE ONE TABLET BY MOUTH DAILY   furosemide (LASIX) 20 MG tablet Take by mouth. Take 2 tablets Tuesday and Wednesday , Off Thursday, Take 2 tablets Friday and Saturday, Off Sunday, Take Monday and Tuesday, off Wednesday as directed.   JARDIANCE 10 MG TABS tablet TAKE ONE TABLET BY MOUTH EVERY DAY BEFORE BREAKFAST   lisinopril (ZESTRIL) 2.5 MG tablet Take 1 tablet (2.5 mg total) by mouth daily.   loratadine (CLARITIN) 10 MG tablet TAKE ONE TABLET BY MOUTH DAILY AS NEEDED FOR ALLERGIES   multivitamin (ONE-A-DAY MEN'S) TABS tablet Take 1 tablet by mouth daily.   nitroGLYCERIN (NITROSTAT) 0.4 MG SL tablet Place 1 tablet (0.4 mg total) under the tongue every 5 (five) minutes as needed for chest pain.   omeprazole (PRILOSEC) 40 MG capsule Take 1 capsule (40 mg total) by mouth  daily before breakfast.   QUEtiapine (SEROQUEL) 25 MG tablet Take 1 tablet (25 mg total) by mouth at bedtime.   rivaroxaban (XARELTO) 20 MG TABS tablet TAKE ONE TABLET BY MOUTH DAILY WITH SUPPER   rosuvastatin (CRESTOR) 40  MG tablet Take 1 tablet (40 mg total) by mouth daily.   triamcinolone ointment (KENALOG) 0.1 % Apply topically.   vitamin C (ASCORBIC ACID) 500 MG tablet Take 500 mg by mouth daily.   No facility-administered encounter medications on file as of 02/12/2021.   Care Gaps: COVID-19 Booster 4  Star Rating Drugs: Rosuvastatin 40 mg last filled on 02/08/2021 for a 90-Day supply with [facility] Jardiance 10 mg last filled on 01/22/2021 for a 90-Day supply with [facility] Lisinopril 2.5 mg last filled on 12/31/2020 for a 90-Day supply with [facility]  Have you seen any other providers since your last visit? No  Any changes in your medications or health? The patient reports he has had many changes to his medications in the last few months. As of right now his health has changed but he is okay no complaints.   Any side effects from any medications? Patient stated that he has been having a hard time sleeping, and he has to take a sleeping pill to go to sleep and stay asleep. He is unsure if this is a side effect of any of his medications.   Do you have an symptoms or problems not managed by your medications? Not at this time.  Any concerns about your health right now? No  Has your provider asked that you check blood pressure, blood sugar, or follow special diet at home? Patient stated he has not been asked to check his blood pressure he does have a machine, but his blood pressure has been so good lately he hardly checks it at this time. Patient denies being on any special diets he states he just eats a normal diet.  Do you get any type of exercise on a regular basis? Patient stated he is having some hip pain, but since receiving a cortisone injections for PCP a week ago his pain has lessened.    Can you think of a goal you would like to reach for your health? Patient would like to start back working out in the gym and he also likes to swim. That is his goal is to start back  being more active.   Do you have any problems getting your medications? Patient doesn't have problems per say getting his medications, but he does have issues with being able to afford all of his medications. I did inform him that we would be able to assist him with this. LIS may also be an option to consider for an overall reduction of medication prices.   Is there anything that you would like to discuss during the appointment? Just trying to see what would be the best way to go on making sure he can afford his medications as this is a burden on him and his spouse since they are on a limited income.   Please bring medications and supplements to appointment  Lynann Bologna, [place] Pharmacist Assistant Phone: [phone]

## 2021-02-17 ENCOUNTER — Telehealth: Payer: PPO

## 2021-02-25 ENCOUNTER — Telehealth: Payer: PPO

## 2021-02-25 ENCOUNTER — Other Ambulatory Visit: Payer: Self-pay | Admitting: Family Medicine

## 2021-02-25 DIAGNOSIS — J302 Other seasonal allergic rhinitis: Secondary | ICD-10-CM

## 2021-03-02 ENCOUNTER — Telehealth: Payer: Self-pay

## 2021-03-02 ENCOUNTER — Ambulatory Visit (INDEPENDENT_AMBULATORY_CARE_PROVIDER_SITE_OTHER): Payer: PPO

## 2021-03-02 DIAGNOSIS — I428 Other cardiomyopathies: Secondary | ICD-10-CM | POA: Diagnosis not present

## 2021-03-02 DIAGNOSIS — I5022 Chronic systolic (congestive) heart failure: Secondary | ICD-10-CM

## 2021-03-02 LAB — CUP PACEART REMOTE DEVICE CHECK
Battery Remaining Longevity: 84 mo
Battery Voltage: 2.99 V
Brady Statistic AP VP Percent: 0 %
Brady Statistic AP VS Percent: 0 %
Brady Statistic AS VP Percent: 4.13 %
Brady Statistic AS VS Percent: 95.87 %
Brady Statistic RA Percent Paced: 0 %
Brady Statistic RV Percent Paced: 5.43 %
Date Time Interrogation Session: 20221025033523
HighPow Impedance: 72 Ohm
Implantable Lead Implant Date: 20130502
Implantable Lead Implant Date: 20130502
Implantable Lead Location: 753859
Implantable Lead Location: 753860
Implantable Lead Model: 5076
Implantable Lead Model: 6935
Implantable Pulse Generator Implant Date: 20181031
Lead Channel Impedance Value: 342 Ohm
Lead Channel Impedance Value: 456 Ohm
Lead Channel Impedance Value: 551 Ohm
Lead Channel Pacing Threshold Amplitude: 0.875 V
Lead Channel Pacing Threshold Pulse Width: 0.4 ms
Lead Channel Sensing Intrinsic Amplitude: 7.125 mV
Lead Channel Sensing Intrinsic Amplitude: 7.125 mV
Lead Channel Setting Pacing Amplitude: 2.5 V
Lead Channel Setting Pacing Pulse Width: 0.4 ms
Lead Channel Setting Sensing Sensitivity: 0.3 mV

## 2021-03-02 NOTE — Progress Notes (Signed)
    Chronic Care Management Pharmacy Assistant   Name: Chris Price  MRN: [id number] DOB: 1940/08/02  Patient called to be reminded of his telephone  appointment with Junius Argyle, CPP on 03/03/2021 @ 0900  Patient aware of appointment date, time, and type of appointment (either telephone or in person). Patient aware to have/bring all medications, supplements, blood pressure and/or blood sugar logs to visit.  Questions: Are there any concerns you would like to discuss during your office visit? Please see Chart Prep on 02/12/2021   Are you having any problems obtaining your medications? Please see Chart Prep on 02/12/2021   Star Rating Drug: Rosuvastatin 40 mg last filled on 02/08/2021 for a 90-Day supply with [facility] Jardiance 10 mg last filled on 01/22/2021 for a 90-Day supply with [facility] Lisinopril 2.5 mg last filled on 12/31/2020 for a 90-Day supply with [facility]  Any gaps in medications fill history? No  Care Gaps: [id number] Booster 4 (patient's spouse stated he received this last week Thursday or Friday)  Lynann Bologna, CPA/CMA Clinical Pharmacist Assistant Phone: [phone]

## 2021-03-03 ENCOUNTER — Ambulatory Visit (INDEPENDENT_AMBULATORY_CARE_PROVIDER_SITE_OTHER): Payer: PPO

## 2021-03-03 DIAGNOSIS — I2510 Atherosclerotic heart disease of native coronary artery without angina pectoris: Secondary | ICD-10-CM

## 2021-03-03 DIAGNOSIS — F5101 Primary insomnia: Secondary | ICD-10-CM

## 2021-03-03 DIAGNOSIS — E785 Hyperlipidemia, unspecified: Secondary | ICD-10-CM

## 2021-03-03 DIAGNOSIS — N401 Enlarged prostate with lower urinary tract symptoms: Secondary | ICD-10-CM

## 2021-03-03 DIAGNOSIS — I4821 Permanent atrial fibrillation: Secondary | ICD-10-CM

## 2021-03-03 DIAGNOSIS — I5022 Chronic systolic (congestive) heart failure: Secondary | ICD-10-CM

## 2021-03-03 NOTE — Progress Notes (Signed)
Chronic Care Management Pharmacy Note  03/08/2021 Name:  Chris Price MRN:  [id number] DOB:  [birth year]  Summary: Patient presents for initial CCM consult. Today's visit was conducted 50% with Satish and 50% with his wife Chris Price. They have multiple financial concerns with their medications.   Recommendations/Changes made from today's visit: Start PAP for Jardiance.   Plan: CPP follow-up 6 months    Subjective: Chris Price is an 80 y.o. year old male who is a primary patient of Alba Cory, MD.  The CCM team was consulted for assistance with disease management and care coordination needs.    Engaged with patient by telephone for initial visit in response to provider referral for pharmacy case management and/or care coordination services.   Consent to Services:  The patient was given the following information about Chronic Care Management services today, agreed to services, and gave verbal consent: 1. CCM service includes personalized support from designated clinical staff supervised by the primary care provider, including individualized plan of care and coordination with other care providers 2. 24/7 contact phone numbers for assistance for urgent and routine care needs. 3. Service will only be billed when office clinical staff spend 20 minutes or more in a month to coordinate care. 4. Only one practitioner may furnish and bill the service in a calendar month. 5.The patient may stop CCM services at any time (effective at the end of the month) by phone call to the office staff. 6. The patient will be responsible for cost sharing (co-pay) of up to 20% of the service fee (after annual deductible is met). Patient agreed to services and consent obtained.  Patient Care Team: Alba Cory, MD as PCP - General (Family Medicine) Duke Salvia, MD as PCP - Cardiology (Cardiology) Duke Salvia, MD as Consulting Physician (Cardiology) Vanna Scotland, MD as Consulting  Physician (Urology) Gaspar Cola, [facility] (Pharmacist)  Recent office visits: 02/08/2021 Alba Cory, MD (PCP Office Visit) for Follow-up- Started: Quetiapine Fumarate 25 mg daily Discontinued: Trazodone HCl 100 mg; Patient instructed to return in 6 months.   Recent consult visits: 12/31/2020 Creig Hines, NP (Cardiology) for Follow-up- Discontinued: Sherryll Burger due to cost Started: Lisinopril 2.5 mg daily, labs ordered, EKG-12 lead place   11/03/2020 Sherryl Manges, MD (Cardiology) for Follow-up- Patient instructed to Increase Lasix to 40 mg daily for 5 days then return to normal dosage; Discontinued: Lisinopril 2.5 mg; Stated: Sacubitril-Valsartan 24-26 mg 1 tablet twice daily; Patient instructed to follow-up in 8 weeks   08/31/2020 Garnetta Buddy, NP (Cardiology) for Follow-up- Started: Farxiga 10 mg daily; Changed how patient takes Furosemide 20 mg Tabs Take 2 tablets Tuesday and Wednesday, Off Thursday, Take 2 Tablets Friday and Saturday off Sunday, Take Monday and Tuesday, off Wednesday as directed. Patient instructed to follow-up in 2 months   08/21/2020 Eula Listen, PA-C (Cardiology) for HFrEF- No medication changes noted, lab order placed; NM Myocar Multi W/Spect W/Wall Motion/EF placed; EKG 12-Lead order placed; patient instructed to follow-up in 1-2 weeks   08/17/2020 Sherryl Manges, MD (Cardiology) for Chest pain- No medication changes noted; Echocardiogram Complete ordered, EKG 12-Lead ordered, Pacemaker external ordered, Cardiac Stress test ordered; Myocardial Perfusion Imaging ordered; Patient to follow-up in August    Hospital visits: None in previous 6 months   Objective:  Lab Results  Component Value Date   CREATININE 1.02 01/08/2021   BUN 16 01/08/2021   GFR 73.33 06/29/2017   GFRNONAA >60 08/21/2020   GFRAA 79 07/31/2019   NA  142 01/08/2021   K 4.4 01/08/2021   CALCIUM 9.1 01/08/2021   CO2 22 01/08/2021   GLUCOSE 87 01/08/2021    Lab Results   Component Value Date/Time   HGBA1C 5.7 (H) 07/31/2019 11:27 AM   HGBA1C 5.9 (H) 07/17/2018 02:59 PM   HGBA1C 5.9 07/08/2013 12:56 PM   HGBA1C 5.7 08/31/2011 05:25 AM   GFR 73.33 06/29/2017 09:28 AM   GFR 75.19 06/17/2016 11:49 AM   MICROALBUR 2.1 (H) 06/29/2017 09:28 AM    Last diabetic Eye exam: No results found for: HMDIABEYEEXA  Last diabetic Foot exam: No results found for: HMDIABFOOTEX   Lab Results  Component Value Date   CHOL 153 01/08/2021   HDL 45 01/08/2021   LDLCALC 91 01/08/2021   LDLDIRECT 143.6 03/21/2012   TRIG 93 01/08/2021   CHOLHDL 3.4 01/08/2021    Hepatic Function Latest Ref Rng & Units 01/08/2021 07/31/2019 07/17/2018  Total Protein 6.0 - 8.5 g/dL 7.2 6.8 6.7  Albumin 3.6 - 4.6 g/dL 4.2 - -  AST 0 - 40 IU/L 38 37(H) 35  ALT 0 - 44 IU/L 33 37 26  Alk Phosphatase 44 - 121 IU/L 55 - -  Total Bilirubin 0.0 - 1.2 mg/dL 1.1 1.1 1.0    Lab Results  Component Value Date/Time   TSH 1.14 07/17/2018 02:59 PM   TSH 1.33 07/08/2013 02:03 AM   TSH 0.78 01/02/2013 09:09 AM    CBC Latest Ref Rng & Units 08/21/2020 07/31/2019 07/17/2018  WBC 4.0 - 10.5 K/uL 6.4 5.5 6.6  Hemoglobin 13.0 - 17.0 g/dL 14.4 14.9 14.8  Hematocrit 39.0 - 52.0 % 43.1 45.0 44.3  Platelets 150 - 400 K/uL 157 184 206    Lab Results  Component Value Date/Time   VD25OH 16.25 (L) 06/17/2016 11:49 AM    Clinical ASCVD: No  The ASCVD Risk score (Arnett DK, et al., 2019) failed to calculate for the following reasons:   The 2019 ASCVD risk score is only valid for ages 58 to 64    Depression screen PHQ 2/9 02/08/2021 07/30/2020 06/09/2020  Decreased Interest 0 0 0  Down, Depressed, Hopeless 0 0 0  PHQ - 2 Score 0 0 0  Altered sleeping - - -  Tired, decreased energy - - -  Change in appetite - - -  Feeling bad or failure about yourself  - - -  Trouble concentrating - - -  Moving slowly or fidgety/restless - - -  Suicidal thoughts - - -  PHQ-9 Score - - -  Difficult doing work/chores - - -     Social History   Tobacco Use  Smoking Status Never  Smokeless Tobacco Never   BP Readings from Last 3 Encounters:  02/08/21 116/72  12/31/20 130/78  11/03/20 120/84   Pulse Readings from Last 3 Encounters:  02/08/21 85  12/31/20 60  11/03/20 62   Wt Readings from Last 3 Encounters:  02/08/21 213 lb (96.6 kg)  12/31/20 210 lb 2 oz (95.3 kg)  11/03/20 215 lb (97.5 kg)   BMI Readings from Last 3 Encounters:  02/08/21 33.36 kg/m  12/31/20 33.41 kg/m  11/03/20 34.70 kg/m    Assessment/Interventions: Review of patient past medical history, allergies, medications, health status, including review of consultants reports, laboratory and other test data, was performed as part of comprehensive evaluation and provision of chronic care management services.   SDOH:  (Social Determinants of Health) assessments and interventions performed: Yes SDOH Interventions    Flowsheet Row  Most Recent Value  SDOH Interventions   Financial Strain Interventions Other (Comment)  [PAP]      SDOH Screenings   Alcohol Screen: Not on file  Depression (PHQ2-9): Low Risk    PHQ-2 Score: 0  Financial Resource Strain: High Risk   Difficulty of Paying Living Expenses: Very hard  Food Insecurity: No Food Insecurity   Worried About Charity fundraiser in the Last Year: Never true   Ran Out of Food in the Last Year: Never true  Housing: Low Risk    Last Housing Risk Score: 0  Physical Activity: Sufficiently Active   Days of Exercise per Week: 3 days   Minutes of Exercise per Session: 60 min  Social Connections: Moderately Isolated   Frequency of Communication with Friends and Family: More than three times a week   Frequency of Social Gatherings with Friends and Family: Three times a week   Attends Religious Services: Never   Active Member of Clubs or Organizations: No   Attends Archivist Meetings: Never   Marital Status: Married  Stress: No Stress Concern Present   Feeling of Stress  : Not at all  Tobacco Use: Low Risk    Smoking Tobacco Use: Never   Smokeless Tobacco Use: Never   Passive Exposure: Not on file  Transportation Needs: No Transportation Needs   Lack of Transportation (Medical): No   Lack of Transportation (Non-Medical): No    CCM Care Plan  Allergies  Allergen Reactions   Coreg [Carvedilol] Other (See Comments)    hypotension   Flomax [Tamsulosin Hcl] Other (See Comments)    Hypotension    Medications Reviewed Today     Reviewed by Carlene Coria, [place] (Certified Medical Assistant) on 02/08/21 at 1431  Med List Status: <None>   Medication Order Taking? Sig Documenting Provider Last Dose Status Informant  bisoprolol (ZEBETA) 5 MG tablet [id number] Yes TAKE 1 TABLET BY MOUTH 2 TIMES DAILY. [name], MD Taking Active   ezetimibe (ZETIA) 10 MG tablet [id number] Yes Take 1 tablet (10 mg total) by mouth daily. [name], MD Taking Active   finasteride (PROSCAR) 5 MG tablet [id number] Yes TAKE ONE TABLET BY MOUTH DAILY [name], MD Taking Active   furosemide (LASIX) 20 MG tablet [id number] Yes Take by mouth. Take 2 tablets Tuesday and Wednesday , Off Thursday, Take 2 tablets Friday and Saturday, Off Sunday, Take Monday and Tuesday, off Wednesday as directed. [provider] Taking Active   JARDIANCE 10 MG TABS tablet [id number] Yes TAKE ONE TABLET BY MOUTH EVERY DAY BEFORE BREAKFAST Loel Dubonnet, NP Taking Active   lisinopril (ZESTRIL) 2.5 MG tablet [id number] Yes Take 1 tablet (2.5 mg total) by mouth daily. Theora Gianotti, NP Taking Active   loratadine (CLARITIN) 10 MG tablet [id number] Yes TAKE ONE TABLET BY MOUTH DAILY AS NEEDED FOR ALLERGIES [name], MD Taking Active   multivitamin (ONE-A-DAY MEN'S) TABS tablet [id number] Yes Take 1 tablet by mouth daily. [provider] Taking Active   nitroGLYCERIN (NITROSTAT) 0.4 MG SL tablet [id number] Yes Place 1 tablet (0.4 mg total) under the tongue every 5  (five) minutes as needed for chest pain. Rise Mu, PA-C Taking Active   omeprazole (PRILOSEC) 40 MG capsule [id number] Yes Take 1 capsule (40 mg total) by mouth daily before breakfast. [name], MD Taking Active   rivaroxaban (XARELTO) 20 MG TABS tablet [id number] Yes TAKE ONE TABLET BY MOUTH DAILY WITH SUPPER [name], MD Taking Active  rosuvastatin (CRESTOR) 40 MG tablet [id number] Yes Take 1 tablet (40 mg total) by mouth daily. Alba Cory, MD Taking Active   traZODone (DESYREL) 100 MG tablet [id number] Yes Take 1 tablet (100 mg total) by mouth at bedtime. Alba Cory, MD Taking Active   triamcinolone ointment (KENALOG) 0.1 % [id number] Yes Apply topically. [provider] Taking Active   vitamin C (ASCORBIC ACID) 500 MG tablet [id number] Yes Take 500 mg by mouth daily. [provider] Taking Active             Patient Active Problem List   Diagnosis Date Noted   Permanent atrial fibrillation (HCC) 12/31/2020   Senile purpura (HCC) 01/31/2020   Primary insomnia 01/31/2020   Hearing loss, sensorineural 09/12/2017   Prediabetes 07/01/2017   Hx of colonic polyps 06/19/2016   Long-term use of high-risk medication 06/19/2016   Cardiomyopathy (HCC) 07/14/2015   Low back pain due to displacement of intervertebral disc 04/09/2015   Diarrhea following gastrointestinal surgery 09/04/2013   S/P laparoscopic cholecystectomy 08/20/2013   Chronic atrial fibrillation (HCC) 07/15/2013   Morbid obesity (HCC) 02/28/2013   OSA on CPAP 01/03/2013   Hyperlipidemia with target low density lipoprotein (LDL) cholesterol less than 70 mg/dL 12/71/0414   ED (erectile dysfunction) of organic origin 01/23/2012   Benign localized prostatic hyperplasia with lower urinary tract symptoms (LUTS) 01/23/2012   Bladder outlet obstruction 12/25/2011   Essential tremor 12/22/2011   Hypertension    Elevated PSA, less than 10 ng/ml 09/20/2011   Automatic implantable  cardioverter-defibrillator Medtronic dual-chamber 09/09/2011   Chronic systolic heart failure (HCC) 08/25/2011   IVCD (intraventricular conduction defect)    Coronary artery disease 08/15/2011    Immunization History  Administered Date(s) Administered   Fluad Quad(high Dose 65+) 01/25/2019, 01/31/2020, 02/08/2021   Influenza Split 02/19/2012   Influenza-Unspecified 02/08/2013, 02/09/2016, 02/20/2017, 02/02/2018   PFIZER(Purple Top)SARS-COV-2 Vaccination 06/03/2019, 06/24/2019, 02/14/2020, 02/25/2021   Pneumococcal Conjugate-13 05/14/2015   Pneumococcal Polysaccharide-23 03/21/2012   Tdap 02/28/2011, 03/28/2011   Zoster Recombinat (Shingrix) 10/29/2019, 12/31/2019    Conditions to be addressed/monitored:  Hypertension, Hyperlipidemia, Atrial Fibrillation, Heart Failure, Coronary Artery Disease, BPH, and Insomnia  Care Plan : General Pharmacy (Adult)  Updates made by Gaspar Cola, RPH since 03/08/2021 12:00 AM     Problem: Hypertension, Hyperlipidemia, Atrial Fibrillation, Heart Failure, Coronary Artery Disease, BPH, and Insomnia   Priority: High     Long-Range Goal: Patient-Specific Goal   Start Date: 03/08/2021  Expected End Date: 03/08/2022  This Visit's Progress: On track  Priority: High  Note:   Current Barriers:  Unable to independently afford treatment regimen  Pharmacist Clinical Goal(s):  Patient will verbalize ability to afford treatment regimen through collaboration with PharmD and provider.   Interventions: 1:1 collaboration with Alba Cory, MD regarding development and update of comprehensive plan of care as evidenced by provider attestation and co-signature Inter-disciplinary care team collaboration (see longitudinal plan of care) Comprehensive medication review performed; medication list updated in electronic medical record  Heart Failure (Goal: manage symptoms and prevent exacerbations) -Controlled -Last ejection fraction: 30-35% (Date:  08/2020) -HF type: Systolic -NYHA Class: II (slight limitation of activity) -AHA HF Stage: C (Heart disease and symptoms present) -Current treatment: Bisoprolol 5 mg twice daily  Furosemide 20 mg 2 tablets every other day (does skip sometimes) Jardiance 10 mg daily  Lisinopril 2.5 mg daily  -Medications previously tried: Chief Financial Officer (Cost), Spironolactone   -Current home BP/HR readings:   107/74  109/69   HR 61-75s  Weight stable around ~214  -Shortness of breath, mostly when active in the yard -Recommended to continue current medication  Hyperlipidemia: (LDL goal < 70) -Uncontrolled -Current treatment: Ezetimibe 10 mg daily  Rosuvastatin 40 mg daily  -Medications previously tried: NA  -Current exercise habits: Yard Work. Previously doing water aerobics  -Recommended to continue current medication  Atrial Fibrillation (Goal: prevent stroke and major bleeding) -Controlled -Current treatment: Rate control: Bisoprolol 5 mg twice daily  Anticoagulation: Xarelto 20 mg daily   -Medications previously tried: NA -Recommended to continue current medication  BPH (Goal: Improve symptoms ) -Controlled -Current treatment  Finasteride 5 mg daily  -Medications previously tried: NA -Endorses polyuria   -Recommended to continue current medication  Insomnia (Goal: Improve sleep quality ) -Controlled -Current treatment  Quetiapine 25 mg nightly  -Medications previously tried: NA -Recommended to continue current medication  Patient Goals/Self-Care Activities Patient will:  - check blood pressure weekly, document, and provide at future appointments weigh daily, and contact provider if weight gain of greater than 2 pounds in 24 hours  Follow Up Plan: Telephone follow up appointment with care management team member scheduled for:  07/14/2021 at 11:00 AM      Medication Assistance: None required.  Patient affirms current coverage meets needs.  Compliance/Adherence/Medication fill  history: Care Gaps: None noted  Star-Rating Drugs: Rosuvastatin 40 mg last filled on 02/08/2021 for a 90-Day supply with [facility] Jardiance 10 mg last filled on 01/22/2021 for a 90-Day supply with [facility] Lisinopril 2.5 mg last filled on 12/31/2020 for a 90-Day supply with [facility]  Patient's preferred pharmacy is:  [facility], [place] - [address] [place] [address] Phone: [phone] Fax: [phone]  [facility] [id number], [place] [address] Phone: [phone] Fax: [phone]  Uses pill box? Yes Pt endorses 100% compliance  We discussed: Current pharmacy is preferred with insurance plan and patient is satisfied with pharmacy services Patient decided to: Continue current medication management strategy  Care Plan and Follow Up Patient Decision:  Patient agrees to Care Plan and Follow-up.  Plan: Telephone follow up appointment with care management team member scheduled for:  07/14/2021 at 11:00 AM  Malva Limes, [facility] [phone]

## 2021-03-05 DIAGNOSIS — Z03818 Encounter for observation for suspected exposure to other biological agents ruled out: Secondary | ICD-10-CM | POA: Diagnosis not present

## 2021-03-05 DIAGNOSIS — J069 Acute upper respiratory infection, unspecified: Secondary | ICD-10-CM | POA: Diagnosis not present

## 2021-03-05 DIAGNOSIS — R509 Fever, unspecified: Secondary | ICD-10-CM | POA: Diagnosis not present

## 2021-03-05 DIAGNOSIS — Z20822 Contact with and (suspected) exposure to covid-19: Secondary | ICD-10-CM | POA: Diagnosis not present

## 2021-03-08 DIAGNOSIS — I2510 Atherosclerotic heart disease of native coronary artery without angina pectoris: Secondary | ICD-10-CM | POA: Diagnosis not present

## 2021-03-08 DIAGNOSIS — I4821 Permanent atrial fibrillation: Secondary | ICD-10-CM

## 2021-03-08 DIAGNOSIS — I5022 Chronic systolic (congestive) heart failure: Secondary | ICD-10-CM

## 2021-03-08 DIAGNOSIS — E785 Hyperlipidemia, unspecified: Secondary | ICD-10-CM

## 2021-03-08 DIAGNOSIS — I2583 Coronary atherosclerosis due to lipid rich plaque: Secondary | ICD-10-CM

## 2021-03-08 DIAGNOSIS — N401 Enlarged prostate with lower urinary tract symptoms: Secondary | ICD-10-CM

## 2021-03-08 NOTE — Patient Instructions (Signed)
Visit Information It was great speaking with you today!  Please let me know if you have any questions about our visit.   Goals Addressed             This Visit's Progress    Track and Manage Fluids and Swelling-Heart Failure       Timeframe:  Long-Range Goal Priority:  High Start Date: 03/08/2021                            Expected End Date: 03/08/2022                      Follow Up within 90 days   - call office if I gain more than 2 pounds in one day or 5 pounds in one week - keep legs up while sitting - use salt in moderation - weigh myself daily    Why is this important?   It is important to check your weight daily and watch how much salt and liquids you have.  It will help you to manage your heart failure.    Notes:         Patient Care Plan: General Pharmacy (Adult)     Problem Identified: Hypertension, Hyperlipidemia, Atrial Fibrillation, Heart Failure, Coronary Artery Disease, BPH, and Insomnia   Priority: High     Long-Range Goal: Patient-Specific Goal   Start Date: 03/08/2021  Expected End Date: 03/08/2022  This Visit's Progress: On track  Priority: High  Note:   Current Barriers:  Unable to independently afford treatment regimen  Pharmacist Clinical Goal(s):  Patient will verbalize ability to afford treatment regimen through collaboration with PharmD and provider.   Interventions: 1:1 collaboration with [name], MD regarding development and update of comprehensive plan of care as evidenced by provider attestation and co-signature Inter-disciplinary care team collaboration (see longitudinal plan of care) Comprehensive medication review performed; medication list updated in electronic medical record  Heart Failure (Goal: manage symptoms and prevent exacerbations) -Controlled -Last ejection fraction: 30-35% (Date: 08/2020) -HF type: Systolic -NYHA Class: II (slight limitation of activity) -AHA HF Stage: C (Heart disease and symptoms  present) -Current treatment: Bisoprolol 5 mg twice daily  Furosemide 20 mg 2 tablets every other day (does skip sometimes) Jardiance 10 mg daily  Lisinopril 2.5 mg daily  -Medications previously tried: Ship broker (Cost), Spironolactone   -Current home BP/HR readings:   107/74  109/69   HR 61-75s   Weight stable around ~214  -Shortness of breath, mostly when active in the yard -Recommended to continue current medication  Hyperlipidemia: (LDL goal < 70) -Uncontrolled -Current treatment: Ezetimibe 10 mg daily  Rosuvastatin 40 mg daily  -Medications previously tried: NA  -Current exercise habits: Yard Work. Previously doing water aerobics  -Recommended to continue current medication  Atrial Fibrillation (Goal: prevent stroke and major bleeding) -Controlled -Current treatment: Rate control: Bisoprolol 5 mg twice daily  Anticoagulation: Xarelto 20 mg daily   -Medications previously tried: NA -Recommended to continue current medication  BPH (Goal: Improve symptoms ) -Controlled -Current treatment  Finasteride 5 mg daily  -Medications previously tried: NA -Endorses polyuria   -Recommended to continue current medication  Insomnia (Goal: Improve sleep quality ) -Controlled -Current treatment  Quetiapine 25 mg nightly  -Medications previously tried: NA -Recommended to continue current medication  Patient Goals/Self-Care Activities Patient will:  - check blood pressure weekly, document, and provide at future appointments weigh daily, and contact provider if weight  gain of greater than 2 pounds in 24 hours  Follow Up Plan: Telephone follow up appointment with care management team member scheduled for:  07/14/2021 at 11:00 AM    Mr. Monk was given information about Chronic Care Management services today including:  CCM service includes personalized support from designated clinical staff supervised by his physician, including individualized plan of care and coordination with  other care providers 24/7 contact phone numbers for assistance for urgent and routine care needs. Standard insurance, coinsurance, copays and deductibles apply for chronic care management only during months in which we provide at least 20 minutes of these services. Most insurances cover these services at 100%, however patients may be responsible for any copay, coinsurance and/or deductible if applicable. This service may help you avoid the need for more expensive face-to-face services. Only one practitioner may furnish and bill the service in a calendar month. The patient may stop CCM services at any time (effective at the end of the month) by phone call to the office staff.  Patient agreed to services and verbal consent obtained.   Patient verbalizes understanding of instructions provided today and agrees to view in [place].   Malva Limes, [facility] [phone]

## 2021-03-09 ENCOUNTER — Telehealth: Payer: Self-pay

## 2021-03-09 DIAGNOSIS — C44529 Squamous cell carcinoma of skin of other part of trunk: Secondary | ICD-10-CM | POA: Diagnosis not present

## 2021-03-09 DIAGNOSIS — L905 Scar conditions and fibrosis of skin: Secondary | ICD-10-CM | POA: Diagnosis not present

## 2021-03-09 DIAGNOSIS — D485 Neoplasm of uncertain behavior of skin: Secondary | ICD-10-CM | POA: Diagnosis not present

## 2021-03-09 DIAGNOSIS — L858 Other specified epidermal thickening: Secondary | ICD-10-CM | POA: Diagnosis not present

## 2021-03-09 NOTE — Progress Notes (Signed)
Chronic Care Management Pharmacy Assistant   Name: ANKITH EDMONSTON  MRN: [id number] DOB: 18-Mar-1941  Patient Assistance Application   I received a task from Junius Argyle, CPP requesting that I start the application process for patient assistance for Jardiance.  The application will be mailed to the patient's home address, and once the patient has completed his portion he will be instructed to return the application to [employer] so Cristie Hem can fax it for processing. The application will be faxed to [employer] @ [phone]. There is a possibility that [employer] will require the patient to complete an LIS application with social security and receiving a denial from them prior to them approving him for their program. Once [employer] starts processing his application I will follow-up to see if that is something they will require.  I spoke with the patient's spouse, and informed her that I was in the process of starting the application for the patient's Jardiance, and to look for the application in the mail so patient can complete his portion of the application. I also informed the patient's spouse that the application would need to be taken to his Cardiologist for the signature of the provider whom has prescribed the medication. I instructed once the provider signs they can have the Cardiology office fax it to [employer], or they can bring the application back to [facility] and have Alex fax it for them. Any questions they can call me directly at [phone].  The spouse verbalized understanding to all.  Medications: Outpatient Encounter Medications as of 03/09/2021  Medication Sig Note   bisoprolol (ZEBETA) 5 MG tablet Take 1 tablet (5 mg total) by mouth 2 (two) times daily.    cetirizine (ZYRTEC) 10 MG tablet Take 10 mg by mouth daily. 03/03/2021: Does not take if taking loratidine   ezetimibe (ZETIA) 10 MG tablet Take 1 tablet (10 mg total) by mouth daily.    finasteride (PROSCAR) 5 MG  tablet TAKE ONE TABLET BY MOUTH DAILY    furosemide (LASIX) 20 MG tablet Take 40 mg by mouth every other day.    JARDIANCE 10 MG TABS tablet TAKE ONE TABLET BY MOUTH EVERY DAY BEFORE BREAKFAST    lisinopril (ZESTRIL) 2.5 MG tablet Take 1 tablet (2.5 mg total) by mouth daily.    loratadine (CLARITIN) 10 MG tablet TAKE ONE TABLET BY MOUTH DAILY AS NEEDED FOR ALLERGIES    multivitamin (ONE-A-DAY MEN'S) TABS tablet Take 1 tablet by mouth daily.    nitroGLYCERIN (NITROSTAT) 0.4 MG SL tablet Place 1 tablet (0.4 mg total) under the tongue every 5 (five) minutes as needed for chest pain. 03/03/2021: Hasn't needed   omeprazole (PRILOSEC) 40 MG capsule Take 1 capsule (40 mg total) by mouth daily before breakfast. (Patient taking differently: Take 40 mg by mouth daily as needed.)    QUEtiapine (SEROQUEL) 25 MG tablet Take 1 tablet (25 mg total) by mouth at bedtime. (Patient not taking: Reported on 03/03/2021)    rivaroxaban (XARELTO) 20 MG TABS tablet TAKE ONE TABLET BY MOUTH DAILY WITH SUPPER    rosuvastatin (CRESTOR) 40 MG tablet Take 1 tablet (40 mg total) by mouth daily.    traZODone (DESYREL) 100 MG tablet Take 100 mg by mouth at bedtime.    triamcinolone ointment (KENALOG) 0.1 % Apply 1 application topically daily as needed.    vitamin B-12 (CYANOCOBALAMIN) 1000 MCG tablet Take 1,000 mcg by mouth daily.    vitamin C (ASCORBIC ACID) 500 MG tablet Take 500 mg by mouth daily.  No facility-administered encounter medications on file as of 03/09/2021.    Lynann Bologna, CPA/CMA Clinical Pharmacist Assistant Phone: [phone]

## 2021-03-10 DIAGNOSIS — G4733 Obstructive sleep apnea (adult) (pediatric): Secondary | ICD-10-CM | POA: Diagnosis not present

## 2021-03-11 NOTE — Progress Notes (Signed)
Remote ICD transmission.   

## 2021-03-22 DIAGNOSIS — C44622 Squamous cell carcinoma of skin of right upper limb, including shoulder: Secondary | ICD-10-CM | POA: Diagnosis not present

## 2021-03-22 DIAGNOSIS — C44519 Basal cell carcinoma of skin of other part of trunk: Secondary | ICD-10-CM | POA: Diagnosis not present

## 2021-04-06 ENCOUNTER — Telehealth: Payer: Self-pay | Admitting: Internal Medicine

## 2021-04-06 NOTE — Telephone Encounter (Signed)
Patient spouse came by and dropped off patient assistance forms to be completed Placed in nurse box

## 2021-04-06 NOTE — Telephone Encounter (Signed)
Patient assistance application for Jardiance was received and completed/ signed by Dr. Caryl Comes.  This was faxed to [employer] at  [phone].  Confirmation received.  I have called and spoken with the patient and notified him of the above.  He voices understanding and is agreeable.

## 2021-05-04 ENCOUNTER — Encounter: Payer: Self-pay | Admitting: Family Medicine

## 2021-05-04 ENCOUNTER — Other Ambulatory Visit: Payer: Self-pay

## 2021-05-04 ENCOUNTER — Other Ambulatory Visit: Payer: Self-pay | Admitting: Family Medicine

## 2021-05-04 DIAGNOSIS — N401 Enlarged prostate with lower urinary tract symptoms: Secondary | ICD-10-CM

## 2021-05-12 ENCOUNTER — Telehealth: Payer: Self-pay | Admitting: Urology

## 2021-05-12 DIAGNOSIS — N401 Enlarged prostate with lower urinary tract symptoms: Secondary | ICD-10-CM

## 2021-05-12 NOTE — Telephone Encounter (Signed)
Pt needs a refill of Finasteride sent to [facility].  He scheduled a follow up for 1/17, but only has a couple of pills left.  Can we send in a one month supply until he comes in for appt?

## 2021-05-13 MED ORDER — FINASTERIDE 5 MG PO TABS: 5.0000 mg | ORAL_TABLET | Freq: Every day | ORAL | 0 refills | Status: DC

## 2021-05-13 NOTE — Telephone Encounter (Signed)
Sherry with [facility] is requesting a refill of Finasteride.  She stated that patient has 2 tablets left and he will be completely out

## 2021-05-13 NOTE — Telephone Encounter (Signed)
Patient informed, RX sent in to [facility]

## 2021-05-21 ENCOUNTER — Encounter: Payer: Self-pay | Admitting: Family Medicine

## 2021-05-21 DIAGNOSIS — H2513 Age-related nuclear cataract, bilateral: Secondary | ICD-10-CM | POA: Diagnosis not present

## 2021-05-24 NOTE — Progress Notes (Signed)
05/25/21 2:38 PM   Chris Price Sep 25, 1940 [id number]  Referring provider:  [name], MD [address] [place],  [place] [address] No chief complaint on file.    HPI: Chris Price is a 81 y.o.male with a personal of BPH with LUTS and ED, who presents today for 1 year follow-up.    He is s/p TURBT in 2013.   PSA screening has been discontinued secondary to the patient's age.  Most recent PSA in 2017 was within normal limits.  He is also had a remote prostate biopsy which was also negative.  He was previously on Flomax, unable to tolerate due to orthostasis. He is currently on finasteride   He is accompanied by his wife today. He reports that he is currently on water pills which has increased frequency.  He otherwise has no urinary complaints.  IPSS as below.  His wife has some questions today about possible side effects of finasteride.   IPSS     Row Name 05/25/21 1400         International Prostate Symptom Score   How often have you had the sensation of not emptying your bladder? Less than 1 in 5     How often have you had to urinate less than every two hours? Not at All     How often have you found you stopped and started again several times when you urinated? Not at All     How often have you found it difficult to postpone urination? Not at All     How often have you had a weak urinary stream? Less than 1 in 5 times     How often have you had to strain to start urination? Not at All     How many times did you typically get up at night to urinate? 1 Time     Total IPSS Score 3       Quality of Life due to urinary symptoms   If you were to spend the rest of your life with your urinary condition just the way it is now how would you feel about that? Pleased              Score:  1-7 Mild 8-19 Moderate 20-35 Severe  PMH: Past Medical History:  Diagnosis Date   Chronic HFrEF (heart failure with reduced ejection fraction) ([place])    a.  08/2020 Echo: EF 30-35%, glob HK, mild LVH. Low nl RV fxn. Sev BAE. Mild MR. Mild-mod TR. Mild AI. Ao sclerosis.   Degenerative disk disease    lumbar   Disc herniation    GERD (gastroesophageal reflux disease)    Hyperlipidemia    Hypertension    ICD (implantable cardiac defibrillator) in place    a. 02/2017 gen change to MDT MRI compatible single lead device. Ser # [id number].   IVCD (intraventricular conduction defect)    Nonspecific QRS duration 122   Lumbar spinal stenosis    Neuromuscular disorder (HCC)    neauropathy   / siatica   NICM (nonischemic cardiomyopathy) ([place])    a. 07/2011 Echo: EF 25-30%-->s/p ICD; b. 02/2017 s/p ICD gen change; c. 08/2020 Echo: EF 30-35%.   Non-obstructive Coronary artery disease 08/15/2011   a. 08/2011 Cath: Moderate nonobstructive CAD; b. 07/2013 Cath: LM nl, LAD 68m, D1 50/70m, LCX 34m, RCA 30p, 50d-->Med rx; c. 08/2020 MV: EF 35%. No ischemia. Fixed apical defect.   Permanent atrial fibrillation (HCC)    Sleep apnea  uses cpap    Surgical History: Past Surgical History:  Procedure Laterality Date   CARDIAC CATHETERIZATION  08/15/2011   [facility]    CARDIAC CATHETERIZATION  07/09/2013   [facility]   CARDIOVERSION  08/2011   [facility]   CHOLECYSTECTOMY  April 2015   COLONOSCOPY WITH PROPOFOL N/A 07/30/2015   Procedure: COLONOSCOPY WITH PROPOFOL;  Surgeon: Lollie Sails, MD;  Location[facility]COPY;  Service: Endoscopy;  Laterality: N/A;   cooled thermotherapy  3875   [place], complicated by incontinence   HERNIA REPAIR     ICD  09/08/2011   ICD GENERATOR CHANGEOUT N/A 03/08/2017   Procedure: ICD GENERATOR CHANGEOUT;  Surgeon: [name], MD;  Location: [facility];  Service: Cardiovascular;  Laterality: N/A;   IMPLANTABLE CARDIOVERTER DEFIBRILLATOR IMPLANT  09/08/2011   Procedure: IMPLANTABLE CARDIOVERTER DEFIBRILLATOR IMPLANT;  Surgeon: [name], MD;  Location: [facility] CATH LAB;  Service: Cardiovascular;;   LUMBAR LAMINECTOMY     L2 through S1  with wide decompression of the thecal sac and nerve roots.   PROSTATE BIOPSY  2013    Home Medications:  Allergies as of 05/25/2021       Reactions   Coreg [carvedilol] Other (See Comments)   hypotension   Flomax [tamsulosin Hcl] Other (See Comments)   Hypotension        Medication List        Accurate as of May 25, 2021  2:38 PM. If you have any questions, ask your nurse or doctor.          STOP taking these medications    cetirizine 10 MG tablet Commonly known as: ZYRTEC Stopped by: [name], MD   QUEtiapine 25 MG tablet Commonly known as: SEROquel Stopped by: [name], MD       TAKE these medications    bisoprolol 5 MG tablet Commonly known as: ZEBETA Take 1 tablet (5 mg total) by mouth 2 (two) times daily.   ezetimibe 10 MG tablet Commonly known as: ZETIA Take 1 tablet (10 mg total) by mouth daily.   finasteride 5 MG tablet Commonly known as: PROSCAR Take 1 tablet (5 mg total) by mouth daily.   furosemide 20 MG tablet Commonly known as: LASIX Take 40 mg by mouth every other day.   Jardiance 10 MG Tabs tablet Generic drug: empagliflozin TAKE ONE TABLET BY MOUTH EVERY DAY BEFORE BREAKFAST   lisinopril 2.5 MG tablet Commonly known as: ZESTRIL Take 1 tablet (2.5 mg total) by mouth daily.   loratadine 10 MG tablet Commonly known as: CLARITIN TAKE ONE TABLET BY MOUTH DAILY AS NEEDED FOR ALLERGIES   multivitamin Tabs tablet Take 1 tablet by mouth daily.   nitroGLYCERIN 0.4 MG SL tablet Commonly known as: NITROSTAT Place 1 tablet (0.4 mg total) under the tongue every 5 (five) minutes as needed for chest pain.   omeprazole 40 MG capsule Commonly known as: PRILOSEC Take 1 capsule (40 mg total) by mouth daily before breakfast. What changed:  when to take this reasons to take this   rivaroxaban 20 MG Tabs tablet Commonly known as: Xarelto TAKE ONE TABLET BY MOUTH DAILY WITH SUPPER   rosuvastatin 40 MG tablet Commonly known  as: CRESTOR Take 1 tablet (40 mg total) by mouth daily.   traZODone 100 MG tablet Commonly known as: DESYREL Take 100 mg by mouth at bedtime.   triamcinolone ointment 0.1 % Commonly known as: KENALOG Apply 1 application topically daily as needed.   vitamin B-12 1000  MCG tablet Commonly known as: CYANOCOBALAMIN Take 1,000 mcg by mouth daily.   vitamin C 500 MG tablet Commonly known as: ASCORBIC ACID Take 500 mg by mouth daily.        Allergies:  Allergies  Allergen Reactions   Coreg [Carvedilol] Other (See Comments)    hypotension   Flomax [Tamsulosin Hcl] Other (See Comments)    Hypotension    Family History: Family History  Problem Relation Age of Onset   Heart attack Father    Heart attack Brother    Heart attack Son    Ovarian cancer Daughter     Social History:  reports that he has never smoked. He has never used smokeless tobacco. He reports that he does not drink alcohol and does not use drugs.   Physical Exam: BP 104/71    Pulse 71    Ht 5\' 7"  (1.702 m)    Wt 213 lb (96.6 kg)    BMI 33.36 kg/m   Constitutional:  Alert and oriented, No acute distress. HEENT: [place] AT, moist mucus membranes.  Trachea midline, no masses. Cardiovascular: No clubbing, cyanosis, or edema. Respiratory: Normal respiratory effort, no increased work of breathing. Skin: No rashes, bruises or suspicious lesions. Neurologic: Grossly intact, no focal deficits, moving all 4 extremities. Psychiatric: Normal mood and affect.  Laboratory Data:  Lab Results  Component Value Date   CREATININE 1.02 01/08/2021   Lab Results  Component Value Date   HGBA1C 5.7 (H) 07/31/2019   Pertinent Imaging: Results for orders placed or performed in visit on 05/25/21  Bladder Scan (Post Void Residual) in office  Result Value Ref Range   Scan Result 0    Assessment & Plan:    1. Benign localized prostatic hyperplasia with lower urinary tract symptoms (LUTS) - Symptoms well controlled/ stable  on finasteride only -Given stability of symptoms, either f/u annually or with PCP as desired  - Adequate bladder emptying - Bladder Scan (Post Void Residual) in office - Continue finasteride. Refill sent.     Return as needed  I,Kailey Littlejohn,acting as a scribe for [name], MD.,have documented all relevant documentation on the behalf of [name], MD,as directed by  [name], MD while in the presence of [name], MD.  I have reviewed the above documentation for accuracy and completeness, and I agree with the above.   [name], MD   [facility] [address] [place], [place] [address] [phone]

## 2021-05-25 ENCOUNTER — Encounter: Payer: Self-pay | Admitting: Urology

## 2021-05-25 ENCOUNTER — Ambulatory Visit: Payer: PPO | Admitting: Urology

## 2021-05-25 ENCOUNTER — Other Ambulatory Visit: Payer: Self-pay

## 2021-05-25 VITALS — BP 104/71 | HR 71 | Ht 67.0 in | Wt 213.0 lb

## 2021-05-25 DIAGNOSIS — N401 Enlarged prostate with lower urinary tract symptoms: Secondary | ICD-10-CM

## 2021-05-25 LAB — BLADDER SCAN AMB NON-IMAGING: Scan Result: 0

## 2021-05-25 MED ORDER — FINASTERIDE 5 MG PO TABS: 5.0000 mg | ORAL_TABLET | Freq: Every day | ORAL | 3 refills | Status: DC

## 2021-05-27 ENCOUNTER — Encounter: Payer: Self-pay | Admitting: Family Medicine

## 2021-05-27 DIAGNOSIS — Z20822 Contact with and (suspected) exposure to covid-19: Secondary | ICD-10-CM | POA: Diagnosis not present

## 2021-05-31 ENCOUNTER — Ambulatory Visit: Payer: PPO | Admitting: Physician Assistant

## 2021-06-01 NOTE — Telephone Encounter (Signed)
Incoming fax from [employer]  Patient has been denied due to reported income exceeds current programs eligibility limits.

## 2021-06-07 ENCOUNTER — Other Ambulatory Visit: Payer: Self-pay

## 2021-06-07 ENCOUNTER — Ambulatory Visit (INDEPENDENT_AMBULATORY_CARE_PROVIDER_SITE_OTHER): Payer: PPO | Admitting: Physician Assistant

## 2021-06-07 ENCOUNTER — Other Ambulatory Visit: Payer: Self-pay | Admitting: Physician Assistant

## 2021-06-07 ENCOUNTER — Encounter: Payer: Self-pay | Admitting: Physician Assistant

## 2021-06-07 VITALS — BP 110/70 | HR 71 | Temp 98.1°F | Resp 16 | Ht 67.0 in | Wt 209.0 lb

## 2021-06-07 DIAGNOSIS — R7303 Prediabetes: Secondary | ICD-10-CM | POA: Diagnosis not present

## 2021-06-07 DIAGNOSIS — R251 Tremor, unspecified: Secondary | ICD-10-CM

## 2021-06-07 DIAGNOSIS — G4733 Obstructive sleep apnea (adult) (pediatric): Secondary | ICD-10-CM

## 2021-06-07 DIAGNOSIS — Z9989 Dependence on other enabling machines and devices: Secondary | ICD-10-CM

## 2021-06-07 DIAGNOSIS — G250 Essential tremor: Secondary | ICD-10-CM

## 2021-06-07 DIAGNOSIS — G3184 Mild cognitive impairment, so stated: Secondary | ICD-10-CM | POA: Diagnosis not present

## 2021-06-07 LAB — COMPREHENSIVE METABOLIC PANEL
AG Ratio: 1.6 (calc) (ref 1.0–2.5)
ALT: 28 U/L (ref 9–46)
AST: 32 U/L (ref 10–35)
Albumin: 4.2 g/dL (ref 3.6–5.1)
Alkaline phosphatase (APISO): 43 U/L (ref 35–144)
BUN: 17 mg/dL (ref 7–25)
CO2: 33 mmol/L — ABNORMAL HIGH (ref 20–32)
Calcium: 9.3 mg/dL (ref 8.6–10.3)
Chloride: 104 mmol/L (ref 98–110)
Creat: 0.87 mg/dL (ref 0.70–1.22)
Globulin: 2.6 g/dL (calc) (ref 1.9–3.7)
Glucose, Bld: 93 mg/dL (ref 65–99)
Potassium: 4.7 mmol/L (ref 3.5–5.3)
Sodium: 142 mmol/L (ref 135–146)
Total Bilirubin: 1.1 mg/dL (ref 0.2–1.2)
Total Protein: 6.8 g/dL (ref 6.1–8.1)

## 2021-06-07 LAB — TSH: TSH: 1.17 mIU/L (ref 0.40–4.50)

## 2021-06-07 LAB — CBC WITH DIFFERENTIAL/PLATELET
Absolute Monocytes: 642 cells/uL (ref 200–950)
Basophils Absolute: 28 cells/uL (ref 0–200)
Basophils Relative: 0.4 %
Eosinophils Absolute: 69 cells/uL (ref 15–500)
Eosinophils Relative: 1 %
HCT: 47.7 % (ref 38.5–50.0)
Hemoglobin: 15.9 g/dL (ref 13.2–17.1)
Lymphs Abs: 1132 cells/uL (ref 850–3900)
MCH: 32.6 pg (ref 27.0–33.0)
MCHC: 33.3 g/dL (ref 32.0–36.0)
MCV: 97.7 fL (ref 80.0–100.0)
MPV: 10 fL (ref 7.5–12.5)
Monocytes Relative: 9.3 %
Neutro Abs: 5030 cells/uL (ref 1500–7800)
Neutrophils Relative %: 72.9 %
Platelets: 191 10*3/uL (ref 140–400)
RBC: 4.88 10*6/uL (ref 4.20–5.80)
RDW: 13.9 % (ref 11.0–15.0)
Total Lymphocyte: 16.4 %
WBC: 6.9 10*3/uL (ref 3.8–10.8)

## 2021-06-07 LAB — VITAMIN D 25 HYDROXY (VIT D DEFICIENCY, FRACTURES): Vit D, 25-Hydroxy: 43 ng/mL (ref 30–100)

## 2021-06-07 LAB — VITAMIN B12: Vitamin B-12: 1028 pg/mL (ref 200–1100)

## 2021-06-07 NOTE — Telephone Encounter (Signed)
I called and spoke with the patient/ his wife regarding the fax notification we had received from [employer] stating that the patient did not qualify for assistance at this time due to "The reported income exceeds current program eligibility limits. This decision is final."  Per Mrs. Golubski, they had not received this notification. They had been contacted by the company a few weeks ago asking for proof of income.   I inquired if the patient was still taking Jardiance.  Per Mrs. Pritt, the patient has not been taking this for "a while."  She then advised the patient was at a doctor's appointment and she asked if I could call her back in ~ 1 hour at [phone]. I advised I will try to call her back around that time.

## 2021-06-07 NOTE — Telephone Encounter (Signed)
I called and spoke with Mrs. O'Brien. I asked if she could please clarify how long the patient has been off Jardiance. Per Mrs. O'Brien "for quite some time."  She advised that with the donut hole, they were "hit really hard."  The patient has an appointment to come in tomorrow to see Dr. Caryl Comes. I advised Dr. Caryl Comes can discuss this with the patient at his visit.  Mrs. O'Brien voices understanding and is agreeable.

## 2021-06-07 NOTE — Progress Notes (Signed)
Established Patient Office Visit  Subjective:  Patient ID: Chris Price, male    DOB: 09-Jun-1940  Age: 81 y.o. MRN: [id number]  Introduced myself to the patient as a PA-C and provided education on APPs in clinical practice.    CC:  Chief Complaint  Patient presents with   Memory Loss   Tremors    Pt states at night gets them worst.    HPI Chris Price presents for concerns for memory impairment and tremors He is here with his wife who provided some parts of HPI  Memory Wife and patient report concerns for mild, intermittent forgetfulness.  Wife states she has to repeat statements and questions Wife reports he seems to be more impatient and mildly irritable when he has to wait for things   Tremors Reports he has difficulty with controlling things he is holding - particularly later in the day Predominantly upper left extremity Wife states tremor is nonexistent at night State that tremor has progressed over the last few years Reports changes to writing and penmanship stating he previously had excellent handwriting but now it is scribble     Past Medical History:  Diagnosis Date   Chronic HFrEF (heart failure with reduced ejection fraction) ([place])    a. 08/2020 Echo: EF 30-35%, glob HK, mild LVH. Low nl RV fxn. Sev BAE. Mild MR. Mild-mod TR. Mild AI. Ao sclerosis.   Degenerative disk disease    lumbar   Disc herniation    GERD (gastroesophageal reflux disease)    Hyperlipidemia    Hypertension    ICD (implantable cardiac defibrillator) in place    a. 02/2017 gen change to MDT MRI compatible single lead device. Ser # [id number].   IVCD (intraventricular conduction defect)    Nonspecific QRS duration 122   Lumbar spinal stenosis    Neuromuscular disorder (HCC)    neauropathy   / siatica   NICM (nonischemic cardiomyopathy) ([place])    a. 07/2011 Echo: EF 25-30%-->s/p ICD; b. 02/2017 s/p ICD gen change; c. 08/2020 Echo: EF 30-35%.   Non-obstructive Coronary artery  disease 08/15/2011   a. 08/2011 Cath: Moderate nonobstructive CAD; b. 07/2013 Cath: LM nl, LAD 5m, D1 50/2m, LCX 3m, RCA 30p, 50d-->Med rx; c. 08/2020 MV: EF 35%. No ischemia. Fixed apical defect.   Permanent atrial fibrillation [facility])    Sleep apnea    uses cpap    Past Surgical History:  Procedure Laterality Date   C[facility]  [facility]    CARDIAC CAT[facility]IZATION  07/09/2013   [facility]   CARD[facility]ERSION  08/2011   [facility]   CHOLECYSTECTOMY  April 2015   COLONOSCOPY WITH PROPOFOL N/A 07/30/2015   Procedure: COLONOSCOPY WITH PROPOFOL;  Surgeon: Lollie Sails, MD;  Location: [facility] ENDOSCOPY;  Service: Endoscopy;  Laterality: N/A;   cooled thermotherapy  1607   [place], complicated by incontinence   HERNIA REPAIR     ICD  09/08/2011   ICD GENERATOR CHANGEOUT N/A 03/08/2017   Procedure: ICD GENERATOR CHANGEOUT;  Surgeon: [name], MD;  Location: [place] CV LAB;  Service: Cardiovascular;  Laterality: N/A;   IMPLANTABLE CARDIOVERTER DEFIBRILLATOR IMPLANT  09/08/2011   Procedure: IMPLANTABLE CARDIOVERTER DEFIBRILLATOR IMPLANT;  Surgeon: [name], MD;  Location: [facility] CATH LAB;  Service: Cardiovascular;;   LUMBAR LAMINECTOMY     L2 through S1 with wide decompression of the thecal sac and nerve roots.   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   Tobacco Use   Smoking status: Never   Smokeless tobacco: Never  Scientific laboratory technician Use: Never used  Substance and Sexual Activity   Alcohol use: No   Drug use: No   Sexual activity: Not Currently  Other Topics Concern    Not on file  Social History Narrative   Second marriage, together since 1980   He has 3 children from previous marriage and one step-daughter    Social Determinants of Health   Financial Resource Strain: High Risk   Difficulty of Paying Living Expenses: Very hard  Food Insecurity: No Food Insecurity   Worried About Charity fundraiser in the Last Year: Never true   Arboriculturist in the Last Year: Never true  Transportation Needs: No Transportation Needs   Lack of Transportation (Medical): No   Lack of Transportation (Non-Medical): No  Physical Activity: Sufficiently Active   Days of Exercise per Week: 3 days   Minutes of Exercise per Session: 60 min  Stress: No Stress Concern Present   Feeling of Stress : Not at all  Social Connections: Moderately Isolated   Frequency of Communication with Friends and Family: More than three times a week   Frequency of Social Gatherings with Friends and Family: Three times a week   Attends Religious Services: Never   Active Member of Clubs or Organizations: No   Attends Archivist Meetings: Never   Marital Status: Married  Human resources officer Violence: Not At Risk   Fear of Current or Ex-Partner: No   Emotionally Abused: No   Physically Abused: No   Sexually Abused: No    Outpatient Medications Prior to Visit  Medication Sig Dispense Refill   bisoprolol (ZEBETA) 5 MG tablet Take 1 tablet (5 mg total) by mouth 2 (two) times daily. 180 tablet 1   ezetimibe (ZETIA) 10 MG tablet Take 1 tablet (10 mg total) by mouth daily. 90 tablet 1   finasteride (PROSCAR) 5 MG tablet Take 1 tablet (5 mg total) by mouth daily. 90 tablet 3   furosemide (LASIX) 20 MG tablet Take 40 mg by mouth every other day.     loratadine (CLARITIN) 10 MG tablet TAKE ONE TABLET BY MOUTH DAILY AS NEEDED FOR ALLERGIES 90 tablet 1   multivitamin (ONE-A-DAY MEN'S) TABS tablet Take 1 tablet by mouth daily.     nitroGLYCERIN (NITROSTAT) 0.4 MG SL tablet Place 1 tablet (0.4  mg total) under the tongue every 5 (five) minutes as needed for chest pain. 25 tablet 1   omeprazole (PRILOSEC) 40 MG capsule Take 1 capsule (40 mg total) by mouth daily before breakfast. (Patient taking differently: Take 40 mg by mouth daily as needed.) 90 capsule 1   rivaroxaban (XARELTO) 20 MG TABS tablet TAKE ONE TABLET BY MOUTH DAILY WITH SUPPER 90 tablet 1   rosuvastatin (CRESTOR) 40 MG tablet Take 1 tablet (40 mg total) by mouth daily. 90 tablet 1   traZODone (DESYREL) 100 MG tablet Take 100  mg by mouth at bedtime.     triamcinolone ointment (KENALOG) 0.1 % Apply 1 application topically daily as needed.     vitamin B-12 (CYANOCOBALAMIN) 1000 MCG tablet Take 1,000 mcg by mouth daily.     vitamin C (ASCORBIC ACID) 500 MG tablet Take 500 mg by mouth daily.     JARDIANCE 10 MG TABS tablet TAKE ONE TABLET BY MOUTH EVERY DAY BEFORE BREAKFAST (Patient not taking: Reported on 06/07/2021) 90 tablet 3   lisinopril (ZESTRIL) 2.5 MG tablet Take 1 tablet (2.5 mg total) by mouth daily. 90 tablet 3   No facility-administered medications prior to visit.    Allergies  Allergen Reactions   Coreg [Carvedilol] Other (See Comments)    hypotension   Flomax [Tamsulosin Hcl] Other (See Comments)    Hypotension    ROS Review of Systems  Eyes:  Negative for visual disturbance.  Musculoskeletal:  Positive for back pain (chronic). Negative for neck pain and neck stiffness.  Neurological:  Positive for tremors and numbness (in feet). Negative for dizziness, weakness, light-headedness and headaches.  Psychiatric/Behavioral:  Negative for confusion, dysphoric mood, hallucinations and sleep disturbance. The patient is not nervous/anxious.      Objective:    Physical Exam Constitutional:      Appearance: Normal appearance. He is obese.  HENT:     Head: Normocephalic and atraumatic.  Eyes:     General: Lids are normal.     Conjunctiva/sclera: Conjunctivae normal.     Pupils: Pupils are equal, round, and  reactive to light.     Comments: Left eye demonstrates manifest exotropia but is able to be refocused to directed gaze.  Cardiovascular:     Pulses: Normal pulses.     Heart sounds: Heart sounds are distant.     Comments: Unable to fully auscultate heart sounds after multiple attempts.  Pulmonary:     Effort: Pulmonary effort is normal.     Breath sounds: Normal breath sounds and air entry. No decreased breath sounds, wheezing or rhonchi.  Neurological:     Mental Status: He is alert and oriented to person, place, and time.     GCS: GCS eye subscore is 4. GCS verbal subscore is 5. GCS motor subscore is 6.     Motor: Tremor present.     Deep Tendon Reflexes:     Reflex Scores:      Tricep reflexes are 2+ on the right side and 2+ on the left side.      Patellar reflexes are 2+ on the right side and 2+ on the left side.    Comments: Mild tremor noted on left upper extremity  Mild tremor of head noted throughout exam    BP 110/70    Pulse 71    Temp 98.1 F (36.7 C) (Oral)    Resp 16    Ht $R'5\' 7"'qP$  (1.702 m)    Wt 209 lb (94.8 kg)    SpO2 96%    BMI 32.73 kg/m  Wt Readings from Last 3 Encounters:  06/07/21 209 lb (94.8 kg)  05/25/21 213 lb (96.6 kg)  02/08/21 213 lb (96.6 kg)     Health Maintenance Due  Topic Date Due   TETANUS/TDAP  03/27/2021   COVID-19 Vaccine (6 - Booster for Pfizer series) 04/22/2021    There are no preventive care reminders to display for this patient.  Lab Results  Component Value Date   TSH 1.14 07/17/2018   Lab Results  Component Value Date  WBC 6.4 08/21/2020   HGB 14.4 08/21/2020   HCT 43.1 08/21/2020   MCV 95.4 08/21/2020   PLT 157 08/21/2020   Lab Results  Component Value Date   NA 142 01/08/2021   K 4.4 01/08/2021   CO2 22 01/08/2021   GLUCOSE 87 01/08/2021   BUN 16 01/08/2021   CREATININE 1.02 01/08/2021   BILITOT 1.1 01/08/2021   ALKPHOS 55 01/08/2021   AST 38 01/08/2021   ALT 33 01/08/2021   PROT 7.2 01/08/2021   ALBUMIN 4.2  01/08/2021   CALCIUM 9.1 01/08/2021   ANIONGAP 9 08/21/2020   EGFR 74 01/08/2021   GFR 73.33 06/29/2017   Lab Results  Component Value Date   CHOL 153 01/08/2021   Lab Results  Component Value Date   HDL 45 01/08/2021   Lab Results  Component Value Date   LDLCALC 91 01/08/2021   Lab Results  Component Value Date   TRIG 93 01/08/2021   Lab Results  Component Value Date   CHOLHDL 3.4 01/08/2021   Lab Results  Component Value Date   HGBA1C 5.7 (H) 07/31/2019      Assessment & Plan:   Problem List Items Addressed This Visit       Respiratory   OSA on CPAP   Relevant Orders   CBC w/Diff/Platelet     Nervous and Auditory   Essential tremor    Chronic, historic problem Patient was last seen by Neurology in 2021 and diagnosed with essential tremor  He describes similar symptoms today but also has concerns for memory issues. MMSE of 23 today indicating mild cognitive impairment  Provided referral to Neurology for subsequent evaluation as tremor and MCI could be linked Ordered lab work as well to rule out vitamin deficiency, thyroid etiology Will follow up with results of labs         Other   Prediabetes   Relevant Orders   Comprehensive Metabolic Panel (CMET)   Mild cognitive impairment    Acute, new problem with progression per patient and wife MMSE was 23/30 today and with 12th grade education this appears to be consistent with mild cognitive impairment Discussed potential use of Memantine, Donepezil to assist with maintaining baseline as well as potential need for mood stabilization Recommend potential SSRI, SNRI discussion at future visit with PHQ9 and GAD7 assessments Provided referral to Neurology to assess for further neurologic condition as patient has concomitant tremor that is concerning him Provided information on Memantine, Donepezil for patient and wife to review so future discussion will be more informed May need to discuss sleep habits as well as  patient is on CPAP Recommend follow up in 4-6 weeks       Relevant Orders   B12   Vitamin D (25 hydroxy)   Other Visit Diagnoses     Tremor    -  Primary   Relevant Orders   Ambulatory referral to Neurology   B12   TSH        No follow-ups on file.   I, Jacen Carlini E Linde Wilensky, PA-C, have reviewed all documentation for this visit. The documentation on 06/07/21 for the exam, diagnosis, procedures, and orders are all accurate and complete.   Emy Angevine, [place]ie [place] MPH [facility]  No orders of the defined types were placed in this encounter.   Follow-up: No follow-ups on file.    Tattiana Fakhouri E Shakendra Griffeth, PA-C

## 2021-06-07 NOTE — Assessment & Plan Note (Signed)
Chronic, historic problem Patient was last seen by Neurology in 2021 and diagnosed with essential tremor  He describes similar symptoms today but also has concerns for memory issues. MMSE of 23 today indicating mild cognitive impairment  Provided referral to Neurology for subsequent evaluation as tremor and MCI could be linked Ordered lab work as well to rule out vitamin deficiency, thyroid etiology Will follow up with results of labs

## 2021-06-07 NOTE — Assessment & Plan Note (Addendum)
Acute, new problem with progression per patient and wife MMSE was 23/30 today and with 12th grade education this appears to be consistent with mild cognitive impairment Discussed potential use of Memantine, Donepezil to assist with maintaining baseline as well as potential need for mood stabilization Recommend potential SSRI, SNRI discussion at future visit with PHQ9 and GAD7 assessments Provided referral to Neurology to assess for further neurologic condition as patient has concomitant tremor that is concerning him Provided information on Memantine, Donepezil for patient and wife to review so future discussion will be more informed May need to discuss sleep habits as well as patient is on CPAP Recommend follow up in 4-6 weeks

## 2021-06-08 ENCOUNTER — Encounter: Payer: Self-pay | Admitting: Internal Medicine

## 2021-06-08 ENCOUNTER — Ambulatory Visit (INDEPENDENT_AMBULATORY_CARE_PROVIDER_SITE_OTHER): Payer: PPO | Admitting: Internal Medicine

## 2021-06-08 VITALS — BP 118/70 | HR 72 | Ht 67.0 in | Wt 211.0 lb

## 2021-06-08 DIAGNOSIS — I4821 Permanent atrial fibrillation: Secondary | ICD-10-CM | POA: Diagnosis not present

## 2021-06-08 DIAGNOSIS — I5022 Chronic systolic (congestive) heart failure: Secondary | ICD-10-CM | POA: Diagnosis not present

## 2021-06-08 DIAGNOSIS — I454 Nonspecific intraventricular block: Secondary | ICD-10-CM | POA: Diagnosis not present

## 2021-06-08 DIAGNOSIS — Z95810 Presence of automatic (implantable) cardiac defibrillator: Secondary | ICD-10-CM

## 2021-06-08 DIAGNOSIS — I429 Cardiomyopathy, unspecified: Secondary | ICD-10-CM

## 2021-06-08 DIAGNOSIS — I428 Other cardiomyopathies: Secondary | ICD-10-CM | POA: Diagnosis not present

## 2021-06-08 DIAGNOSIS — Z79899 Other long term (current) drug therapy: Secondary | ICD-10-CM | POA: Diagnosis not present

## 2021-06-08 LAB — PACEMAKER DEVICE OBSERVATION

## 2021-06-08 MED ORDER — RIVAROXABAN 20 MG PO TABS: ORAL_TABLET | ORAL | 1 refills | Status: DC

## 2021-06-08 MED ORDER — EMPAGLIFLOZIN 10 MG PO TABS: 10.0000 mg | ORAL_TABLET | Freq: Every day | ORAL | 3 refills | Status: DC

## 2021-06-08 MED ORDER — DIGOXIN 125 MCG PO TABS: 0.1250 mg | ORAL_TABLET | Freq: Every day | ORAL | 3 refills | Status: DC

## 2021-06-08 NOTE — Patient Instructions (Addendum)
Medication Instructions:  Your physician has recommended you make the following change in your medication:   ** Begin Digoxin 0.125mg  - 1 tablet by mouth daily.  *If you need a refill on your cardiac medications before your next appointment, please call your pharmacy*   Lab Work:  ** Dig level in 2 weeks  If you have labs (blood work) drawn today and your tests are completely normal, you will receive your results only by: [place] (if you have MyChart) OR A paper copy in the mail If you have any lab test that is abnormal or we need to change your treatment, we will call you to review the results.   Testing/Procedures: None ordered.      Follow-Up: At [facility], you and your health needs are our priority.  As part of our continuing mission to provide you with exceptional heart care, we have created designated Provider Care Teams.  These Care Teams include your primary Cardiologist (physician) and Advanced Practice Providers (APPs -  Physician Assistants and Nurse Practitioners) who all work together to provide you with the care you need, when you need it.  We recommend signing up for the patient portal called "MyChart".  Sign up information is provided on this After Visit Summary.  MyChart is used to connect with patients for Virtual Visits (Telemedicine).  Patients are able to view lab/test results, encounter notes, upcoming appointments, etc.  Non-urgent messages can be sent to your provider as well.   To learn more about what you can do with MyChart, go to [email or web address].    Your next appointment:   2 months with Dr Caryl Comes  Will send prescriptions for [place] and [place] to [facility] [phone]

## 2021-06-08 NOTE — Progress Notes (Signed)
Patient ID: Chris Price, male   DOB: [birth year], 81 y.o.   MRN: [id number]  Patient Care Team: [name], MD as PCP - General (Family Medicine) [name], MD as PCP - Cardiology (Cardiology) [name], MD as Consulting Physician (Cardiology) [name], MD as Consulting Physician (Urology) [name], [facility] (Pharmacist)   HPI  Chris Price is a 81 y.o. male Seen in followup for an ICD implanted (DOI 2013/ gen change 10/18)for primary prevention in nonischemic cardiomyopathy further complicated by sinus bradycardia/ brady   Atrial fibrillation, managed previously with amiodarone but now permanent.   Anticoagulation with Rivaroxaban     Has had hypotension limiting rate control  and cardiomyopathy drugs ( ie aldactone/entresto)  SGLT2 was cost prohibitive  The patient denies chest pain, nocturnal dyspnea, orthopnea or peripheral edema.  There have been no palpitations or syncope.  Complains of dyspnea on exertion.  Very limiting.  Diuresis has been quite effective.  He does not notice a particular difference on good days and bad days vis--vis his diuretic days.  Does have some lightheadedness with standing.   DATE Test EF%   3/15 Cath  35 % Nonobstructive disease  4/22  Echo 30-35%   4/22 Myoview  35% No ischemia     Date Cr K Hgb  10/18 0.95 4.9    3/20 0.96 4.5 14.8  3/21 1.03 4.3 14.9  11/21 1.0 4.2 15.6  4/22 1.01 4.1 14.4  1/23 0.87 4.7 79.3   Thromboembolic risk factors ( age  -2, HTN-1, Vasc disease -1, CHF-1,) for a CHADSVASc Score of 5  Past Medical History:  Diagnosis Date   Chronic HFrEF (heart failure with reduced ejection fraction) ([place])    a. 08/2020 Echo: EF 30-35%, glob HK, mild LVH. Low nl RV fxn. Sev BAE. Mild MR. Mild-mod TR. Mild AI. Ao sclerosis.   Degenerative disk disease    lumbar   Disc herniation    GERD (gastroesophageal reflux disease)    Hyperlipidemia    Hypertension    ICD (implantable cardiac  defibrillator) in place    a. 02/2017 gen change to MDT MRI compatible single lead device. Ser # [id number].   IVCD (intraventricular conduction defect)    Nonspecific QRS duration 122   Lumbar spinal stenosis    Neuromuscular disorder (HCC)    neauropathy   / siatica   NICM (nonischemic cardiomyopathy) ([place])    a. 07/2011 Echo: EF 25-30%-->s/p ICD; b. 02/2017 s/p ICD gen change; c. 08/2020 Echo: EF 30-35%.   Non-obstructive Coronary artery disease 08/15/2011   a. 08/2011 Cath: Moderate nonobstructive CAD; b. 07/2013 Cath: LM nl, LAD 5m, D1 50/20m, LCX 85m, RCA 30p, 50d-->Med rx; c. 08/2020 MV: EF 35%. No ischemia. Fixed apical defect.   Permanent atrial fibrillation [facility])    Sleep apnea    uses cpap    Past Surgical History:  Procedure Laterality Date   CARDIAC [facility]ATION  [facility]    CARDIAC CAT[facility]IZATION  07/09/2013   [facility]   CARDIOVERSION  08/2011   [facility]   CHOLECYSTECTOMY  April 2015   COLONOSCOPY WITH PROPOFOL N/A 07/30/2015   Procedure: COLONOSCOPY WITH PROPOFOL;  Surgeon: Lollie Sails, MD;  Location: [facility] ENDOSCOPY;  Service: Endoscopy;  Laterality: N/A;   cooled thermotherapy  0076   [place], complicated by incontinence   HERNIA REPAIR     ICD  09/08/2011   ICD GENERATOR CHANGEOUT N/A 03/08/2017   Procedure: ICD GENERATOR CHANGEOUT;  Surgeon:  [name], MD;  Location: [place] CV LAB;  Service: Cardiovascular;  Laterality: N/A;   IMPLANTABLE CARDIOVERTER DEFIBRILLATOR IMPLANT  09/08/2011   Procedure: IMPLANTABLE CARDIOVERTER DEFIBRILLATOR IMPLANT;  Surgeon: [name], MD;  [facility];  Service: Cardiovascular;;   LUMBAR LAMINECTOMY     L2 through S1 with wide decompression of the thecal sac and nerve roots.   PROSTATE BIOPSY  2013    Current Outpatient Medications  Medication Sig Dispense Refill   bisoprolol (ZEBETA) 5 MG tablet Take 1 tablet (5 mg total) by mouth 2 (two) times daily. 180 tablet 1   digoxin (LANOXIN) 0.125 MG tablet  Take 1 tablet (0.125 mg total) by mouth daily. 90 tablet 3   ezetimibe (ZETIA) 10 MG tablet Take 1 tablet (10 mg total) by mouth daily. 90 tablet 1   finasteride (PROSCAR) 5 MG tablet Take 1 tablet (5 mg total) by mouth daily. 90 tablet 3   furosemide (LASIX) 20 MG tablet Take 40 mg by mouth every other day.     lisinopril (ZESTRIL) 2.5 MG tablet Take 1 tablet (2.5 mg total) by mouth daily. 90 tablet 3   loratadine (CLARITIN) 10 MG tablet TAKE ONE TABLET BY MOUTH DAILY AS NEEDED FOR ALLERGIES 90 tablet 1   multivitamin (ONE-A-DAY MEN'S) TABS tablet Take 1 tablet by mouth daily.     nitroGLYCERIN (NITROSTAT) 0.4 MG SL tablet Place 1 tablet (0.4 mg total) under the tongue every 5 (five) minutes as needed for chest pain. 25 tablet 1   omeprazole (PRILOSEC) 40 MG capsule Take 1 capsule (40 mg total) by mouth daily before breakfast. (Patient taking differently: Take 40 mg by mouth daily as needed.) 90 capsule 1   rosuvastatin (CRESTOR) 40 MG tablet Take 1 tablet (40 mg total) by mouth daily. 90 tablet 1   traZODone (DESYREL) 100 MG tablet Take 100 mg by mouth at bedtime.     triamcinolone ointment (KENALOG) 0.1 % Apply 1 application topically daily as needed.     vitamin B-12 (CYANOCOBALAMIN) 1000 MCG tablet Take 1,000 mcg by mouth daily.     vitamin C (ASCORBIC ACID) 500 MG tablet Take 500 mg by mouth daily.     empagliflozin (JARDIANCE) 10 MG TABS tablet Take 1 tablet (10 mg total) by mouth daily. 90 tablet 3   rivaroxaban (XARELTO) 20 MG TABS tablet TAKE ONE TABLET BY MOUTH DAILY WITH SUPPER 90 tablet 1   No current facility-administered medications for this visit.    Allergies  Allergen Reactions   Coreg [Carvedilol] Other (See Comments)    hypotension   Flomax [Tamsulosin Hcl] Other (See Comments)    Hypotension    Review of Systems negative except from HPI and PMH  Physical Exam: BP 118/70 (BP Location: Left Arm, Patient Position: Sitting, Cuff Size: Normal)    Pulse 72    Ht 5\' 7"   (1.702 m)    Wt 211 lb (95.7 kg)    SpO2 96%    BMI 33.05 kg/m    Well developed and Morbidly obese in no acute distress HENT normal Neck supple with JVP-flat Clear Device pocket well healed; without hematoma or erythema.  There is no tethering  Regular rate and rhythm, no gallop  murmur Abd-soft with active BS No Clubbing cyanosis  edema Skin-warm and dry A & Oriented  Grossly normal sensory and motor function  ECG atrial fibrillation at 72 Interval-/15/45 Right bundle branch block left axis deviation  Assessment and  Plan  NICM  Congestive heart failure-acute/chronic-systolic grade 3  Afib-permanent  Chest pain  HTN  IVCD/LBBB  Tremor  ICD-MDT    Bradycardia  4% Vp'    Atrial fibrillation is permanent.  There seems to be an exuberant heart rate response with activity.  We will continue his bisoprolol 5 mg twice daily.  Has some bradycardia.  Currently had about 4.5 % ventricular pacing.  We will follow this along as we had the digoxin.  No bleeding.  Permanent.  Continue Xarelto 20 mg.  Creatinine clearance is greater than 50  With his cardiomyopathy, unfortunately, blood pressure has limited guideline directed therapy including inability use Entresto and/or spironolactone.  We will continue his bisoprolol at 5.  He would like to try Jardiance again which we will obtain from [place]  With his atrial fibrillation and rapid rate and depressed left ventricular function we have decided to proceed with digoxin.  We will begin him on 0.125 mg.  We will check a level in about 2 weeks and then reassess heart rate excursion in about 8 weeks.  He is euvolemic.  We will continue him on his diuretics 40 q. OD.  We will refill his prescriptions and we will try and get it from [place]

## 2021-06-10 ENCOUNTER — Ambulatory Visit (INDEPENDENT_AMBULATORY_CARE_PROVIDER_SITE_OTHER): Payer: PPO

## 2021-06-10 VITALS — BP 122/78 | HR 72 | Temp 98.3°F | Resp 15 | Ht 66.0 in | Wt 210.3 lb

## 2021-06-10 DIAGNOSIS — Z0000 Encounter for general adult medical examination without abnormal findings: Secondary | ICD-10-CM | POA: Diagnosis not present

## 2021-06-10 NOTE — Patient Instructions (Signed)
Chris Price , Thank you for taking time to come for your Medicare Wellness Visit. I appreciate your ongoing commitment to your health goals. Please review the following plan we discussed and let me know if I can assist you in the future.   Screening recommendations/referrals: Colonoscopy: no  longer required Recommended yearly ophthalmology/optometry visit for glaucoma screening and checkup Recommended yearly dental visit for hygiene and checkup  Vaccinations: Influenza vaccine: done 02/08/21 Pneumococcal vaccine: done 05/14/15 Tdap vaccine: due Shingles vaccine: done 10/29/19 & 12/31/19   Covid-19: done 06/03/19, 06/24/19, 02/14/20, 08/07/20 & 02/25/21  Conditions/risks identified: Keep up the great work!  Next appointment: Follow up in one year for your annual wellness visit.   Preventive Care 83 Years and Older, Male Preventive care refers to lifestyle choices and visits with your health care provider that can promote health and wellness. What does preventive care include? A yearly physical exam. This is also called an annual well check. Dental exams once or twice a year. Routine eye exams. Ask your health care provider how often you should have your eyes checked. Personal lifestyle choices, including: Daily care of your teeth and gums. Regular physical activity. Eating a healthy diet. Avoiding tobacco and drug use. Limiting alcohol use. Practicing safe sex. Taking low doses of aspirin every day. Taking vitamin and mineral supplements as recommended by your health care provider. What happens during an annual well check? The services and screenings done by your health care provider during your annual well check will depend on your age, overall health, lifestyle risk factors, and family history of disease. Counseling  Your health care provider may ask you questions about your: Alcohol use. Tobacco use. Drug use. Emotional well-being. Home and relationship well-being. Sexual  activity. Eating habits. History of falls. Memory and ability to understand (cognition). Work and work Statistician. Screening  You may have the following tests or measurements: Height, weight, and BMI. Blood pressure. Lipid and cholesterol levels. These may be checked every 5 years, or more frequently if you are over 53 years old. Skin check. Lung cancer screening. You may have this screening every year starting at age 43 if you have a 30-pack-year history of smoking and currently smoke or have quit within the past 15 years. Fecal occult blood test (FOBT) of the stool. You may have this test every year starting at age 23. Flexible sigmoidoscopy or colonoscopy. You may have a sigmoidoscopy every 5 years or a colonoscopy every 10 years starting at age 33. Prostate cancer screening. Recommendations will vary depending on your family history and other risks. Hepatitis C blood test. Hepatitis B blood test. Sexually transmitted disease (STD) testing. Diabetes screening. This is done by checking your blood sugar (glucose) after you have not eaten for a while (fasting). You may have this done every 1-3 years. Abdominal aortic aneurysm (AAA) screening. You may need this if you are a current or former smoker. Osteoporosis. You may be screened starting at age 45 if you are at high risk. Talk with your health care provider about your test results, treatment options, and if necessary, the need for more tests. Vaccines  Your health care provider may recommend certain vaccines, such as: Influenza vaccine. This is recommended every year. Tetanus, diphtheria, and acellular pertussis (Tdap, Td) vaccine. You may need a Td booster every 10 years. Zoster vaccine. You may need this after age 87. Pneumococcal 13-valent conjugate (PCV13) vaccine. One dose is recommended after age 54. Pneumococcal polysaccharide (PPSV23) vaccine. One dose is recommended after  age 32. Talk to your health care provider about which  screenings and vaccines you need and how often you need them. This information is not intended to replace advice given to you by your health care provider. Make sure you discuss any questions you have with your health care provider. Document Released: 05/22/2015 Document Revised: 01/13/2016 Document Reviewed: 02/24/2015 Elsevier Interactive Patient Education  2017 [employer] Prevention in the Home Falls can cause injuries. They can happen to people of all ages. There are many things you can do to make your home safe and to help prevent falls. What can I do on the outside of my home? Regularly fix the edges of walkways and driveways and fix any cracks. Remove anything that might make you trip as you walk through a door, such as a raised step or threshold. Trim any bushes or trees on the path to your home. Use bright outdoor lighting. Clear any walking paths of anything that might make someone trip, such as rocks or tools. Regularly check to see if handrails are loose or broken. Make sure that both sides of any steps have handrails. Any raised decks and porches should have guardrails on the edges. Have any leaves, snow, or ice cleared regularly. Use sand or salt on walking paths during winter. Clean up any spills in your garage right away. This includes oil or grease spills. What can I do in the bathroom? Use night lights. Install grab bars by the toilet and in the tub and shower. Do not use towel bars as grab bars. Use non-skid mats or decals in the tub or shower. If you need to sit down in the shower, use a plastic, non-slip stool. Keep the floor dry. Clean up any water that spills on the floor as soon as it happens. Remove soap buildup in the tub or shower regularly. Attach bath mats securely with double-sided non-slip rug tape. Do not have throw rugs and other things on the floor that can make you trip. What can I do in the bedroom? Use night lights. Make sure that you have a  light by your bed that is easy to reach. Do not use any sheets or blankets that are too big for your bed. They should not hang down onto the floor. Have a firm chair that has side arms. You can use this for support while you get dressed. Do not have throw rugs and other things on the floor that can make you trip. What can I do in the kitchen? Clean up any spills right away. Avoid walking on wet floors. Keep items that you use a lot in easy-to-reach places. If you need to reach something above you, use a strong step stool that has a grab bar. Keep electrical cords out of the way. Do not use floor polish or wax that makes floors slippery. If you must use wax, use non-skid floor wax. Do not have throw rugs and other things on the floor that can make you trip. What can I do with my stairs? Do not leave any items on the stairs. Make sure that there are handrails on both sides of the stairs and use them. Fix handrails that are broken or loose. Make sure that handrails are as long as the stairways. Check any carpeting to make sure that it is firmly attached to the stairs. Fix any carpet that is loose or worn. Avoid having throw rugs at the top or bottom of the stairs. If you do  have throw rugs, attach them to the floor with carpet tape. Make sure that you have a light switch at the top of the stairs and the bottom of the stairs. If you do not have them, ask someone to add them for you. What else can I do to help prevent falls? Wear shoes that: Do not have high heels. Have rubber bottoms. Are comfortable and fit you well. Are closed at the toe. Do not wear sandals. If you use a stepladder: Make sure that it is fully opened. Do not climb a closed stepladder. Make sure that both sides of the stepladder are locked into place. Ask someone to hold it for you, if possible. Clearly mark and make sure that you can see: Any grab bars or handrails. First and last steps. Where the edge of each step  is. Use tools that help you move around (mobility aids) if they are needed. These include: Canes. Walkers. Scooters. Crutches. Turn on the lights when you go into a dark area. Replace any light bulbs as soon as they burn out. Set up your furniture so you have a clear path. Avoid moving your furniture around. If any of your floors are uneven, fix them. If there are any pets around you, be aware of where they are. Review your medicines with your doctor. Some medicines can make you feel dizzy. This can increase your chance of falling. Ask your doctor what other things that you can do to help prevent falls. This information is not intended to replace advice given to you by your health care provider. Make sure you discuss any questions you have with your health care provider. Document Released: 02/19/2009 Document Revised: 10/01/2015 Document Reviewed: 05/30/2014 Elsevier Interactive Patient Education  2017 [employer].

## 2021-06-10 NOTE — Progress Notes (Signed)
Subjective:   Chris Price is a 81 y.o. male who presents for Medicare Annual/Subsequent preventive examination.  Review of Systems     Cardiac Risk Factors include: advanced age (>16men, >16 women);dyslipidemia;male gender;hypertension;obesity (BMI >30kg/m2)     Objective:    Today's Vitals   06/10/21 0820  BP: 122/78  Pulse: 72  Resp: 15  Temp: 98.3 F (36.8 C)  TempSrc: Oral  SpO2: 93%  Weight: 210 lb 4.8 oz (95.4 kg)  Height: 5\' 6"  (1.676 m)   Body mass index is 33.94 kg/m.  Advanced Directives 06/10/2021 05/21/2019 05/15/2018 05/12/2017 03/08/2017 05/12/2016 07/30/2015  Does Patient Have a Medical Advance Directive? Yes Yes Yes Yes Yes Yes No  Type of Paramedic of [place];Living will [place];Living will Living will;Healthcare Power of [place];Living will [place];Living will Living will -  Does patient want to make changes to medical advance directive? - - No - Patient declined No - Patient declined No - Patient declined No - Patient declined -  Copy of [place] in Chart? Yes - validated most recent copy scanned in chart (See row information) Yes - validated most recent copy scanned in chart (See row information) Yes - validated most recent copy scanned in chart (See row information) Yes Yes - -  Would patient like information on creating a medical advance directive? - - - - - - No - patient declined information    Current Medications (verified) Outpatient Encounter Medications as of 06/10/2021  Medication Sig   B Complex-Folic Acid (SUPER B COMPLEX MAXI) TABS    bisoprolol (ZEBETA) 5 MG tablet Take 1 tablet (5 mg total) by mouth 2 (two) times daily.   digoxin (LANOXIN) 0.125 MG tablet Take 1 tablet (0.125 mg total) by mouth daily.   empagliflozin (JARDIANCE) 10 MG TABS tablet Take 1 tablet (10 mg total) by mouth daily.   ezetimibe (ZETIA) 10 MG tablet Take 1  tablet (10 mg total) by mouth daily.   finasteride (PROSCAR) 5 MG tablet Take 1 tablet (5 mg total) by mouth daily.   furosemide (LASIX) 20 MG tablet Take 40 mg by mouth every other day.   lisinopril (ZESTRIL) 2.5 MG tablet Take 1 tablet (2.5 mg total) by mouth daily.   loratadine (CLARITIN) 10 MG tablet TAKE ONE TABLET BY MOUTH DAILY AS NEEDED FOR ALLERGIES   multivitamin (ONE-A-DAY MEN'S) TABS tablet Take 1 tablet by mouth daily.   nitroGLYCERIN (NITROSTAT) 0.4 MG SL tablet Place 1 tablet (0.4 mg total) under the tongue every 5 (five) minutes as needed for chest pain.   omeprazole (PRILOSEC) 40 MG capsule Take 1 capsule (40 mg total) by mouth daily before breakfast. (Patient taking differently: Take 40 mg by mouth daily as needed.)   rivaroxaban (XARELTO) 20 MG TABS tablet TAKE ONE TABLET BY MOUTH DAILY WITH SUPPER   rosuvastatin (CRESTOR) 40 MG tablet Take 1 tablet (40 mg total) by mouth daily.   traZODone (DESYREL) 100 MG tablet Take 100 mg by mouth at bedtime.   triamcinolone ointment (KENALOG) 0.1 % Apply 1 application topically daily as needed.   vitamin C (ASCORBIC ACID) 500 MG tablet Take 500 mg by mouth daily.   [DISCONTINUED] vitamin B-12 (CYANOCOBALAMIN) 1000 MCG tablet Take 1,000 mcg by mouth daily.   No facility-administered encounter medications on file as of 06/10/2021.    Allergies (verified) Coreg [carvedilol] and Flomax [tamsulosin hcl]   History: Past Medical History:  Diagnosis Date   Chronic HFrEF (heart failure with reduced ejection fraction) ([place])    a. 08/2020 Echo: EF 30-35%, glob HK, mild LVH. Low nl RV fxn. Sev BAE. Mild MR. Mild-mod TR. Mild AI. Ao sclerosis.   Degenerative disk disease    lumbar   Disc herniation    GERD (gastroesophageal reflux disease)    Hyperlipidemia    Hypertension    ICD (implantable cardiac defibrillator) in place    a. 02/2017 gen change to MDT MRI compatible single lead device. Ser # [id number].   IVCD (intraventricular  conduction defect)    Nonspecific QRS duration 122   Lumbar spinal stenosis    Neuromuscular disorder (HCC)    neauropathy   / siatica   NICM (nonischemic cardiomyopathy) ([place])    a. 07/2011 Echo: EF 25-30%-->s/p ICD; b. 02/2017 s/p ICD gen change; c. 08/2020 Echo: EF 30-35%.   Non-obstructive Coronary artery disease 08/15/2011   a. 08/2011 Cath: Moderate nonobstructive CAD; b. 07/2013 Cath: LM nl, LAD 60m, D1 50/5m, LCX 26m, RCA 30p, 50d-->Med rx; c. 08/2020 MV: EF 35%. No ischemia. Fixed apical defect.   Permanent atrial fibrillation [facility])    Sleep apnea    uses cpap   Past Surgical History:  Procedure Laterality Date   CARDIAC CATHETERIZATION  08/15/2011   [facility]    CARDIAC CATHETERIZATION  07/09/2013   [facility]   CARDIOVERSION  08/2011   [facility]   CHOLECYSTECTOMY  April 2015   COLONOSCOPY WITH PROPOFOL N/A 07/30/2015   Procedure: COLONOSCOPY WITH PROPOFOL;  Surgeon: Lollie Sails, MD;  Location: [facility] ENDOSCOPY;  Service: Endoscopy;  Laterality: N/A;   cooled thermotherapy  0932   [place], complicated by incontinence   HERNIA REPAIR     ICD  09/08/2011   ICD GENERATOR CHANGEOUT N/A 03/08/2017   Procedure: ICD GENERATOR CHANGEOUT;  Surgeon: [name], MD;  Location: [facility] CV LAB;  Service: Cardiovascular;  Laterality: N/A;   IMPLANTABLE CARDIOVERTER DEFIBRILLATOR IMPLANT  09/08/2011   Procedure: IMPLANTABLE CARDIOVERTER DEFIBRILLATOR IMPLANT;  Surgeon: [name], MD;  Location: [facility] CATH LAB;  Service: Cardiovascular;;   LUMBAR LAMINECTOMY     L2 through S1 with wide decompression of the thecal sac and nerve roots.   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   Tobacco Use   Smoking status: Never   Smokeless tobacco: Never  Scientific laboratory technician Use: Never used  Substance and Sexual Activity   Alcohol use: No   Drug use: No   Sexual activity: Not Currently  Other Topics Concern   Not on file  Social History Narrative   Second marriage, together since 1980   He has 3 children from previous marriage and one step-daughter    Social Determinants of Radio broadcast assistant Strain: Low Risk    Difficulty of Paying Living Expenses: Not very hard  Food Insecurity: No Food Insecurity   Worried About [place] in the Last Year:  Never true   Ran Out of Food in the Last Year: Never true  Transportation Needs: No Transportation Needs   Lack of Transportation (Medical): No   Lack of Transportation (Non-Medical): No  Physical Activity: Insufficiently Active   Days of Exercise per Week: 3 days   Minutes of Exercise per Session: 30 min  Stress: No Stress Concern Present   Feeling of Stress : Only a little  Social Connections: Moderately Isolated   Frequency of Communication with Friends and Family: More than three times a week   Frequency of Social Gatherings with Friends and Family: Three times a week   Attends Religious Services: Never   Active Member of Clubs or Organizations: No   Attends Music therapist: Never   Marital Status: Married    Tobacco Counseling Counseling given: Not Answered   Clinical Intake:  Pre-visit preparation completed: Yes  Pain : No/denies pain     BMI - recorded: 33.94 Nutritional Status: BMI > 30  Obese Nutritional Risks: None Diabetes: No CBG done?: No Did pt. bring in CBG monitor from home?: No  How often do you need to have someone help you when you read instructions, pamphlets, or other written materials from your doctor or pharmacy?: 1 - Never     Interpreter Needed?: No  Information  entered by :: Clemetine Marker LPN   Activities of Daily Living In your present state of health, do you have any difficulty performing the following activities: 06/10/2021 06/07/2021  Hearing? Tempie Donning  Vision? Y Y  Difficulty concentrating or making decisions? Tempie Donning  Walking or climbing stairs? N N  Dressing or bathing? N N  Doing errands, shopping? N N  Preparing Food and eating ? N -  Using the Toilet? N -  In the past six months, have you accidently leaked urine? Y -  Do you have problems with loss of bowel control? N -  Managing your Medications? N -  Managing your Finances? N -  Housekeeping or managing your Housekeeping? N -  Some recent data might be hidden    Patient Care Team: [name], MD as PCP - General (Family Medicine) [name], MD as Consulting Physician (Cardiology) [name], MD as Consulting Physician (Urology) [name], [facility] (Pharmacist)  Indicate any recent Medical Services you may have received from other than [facility] providers in the past year (date may be approximate).     Assessment:   This is a routine wellness examination for Chris Price.  Hearing/Vision screen Hearing Screening - Comments:: Pt wears hearing aids by [facility] in [place] - Comments:: Vision screenings done at [place] issues and exercise activities discussed: Current Exercise Habits: Home exercise routine, Type of exercise: stretching, Time (Minutes): 30, Frequency (Times/Week): 3, Weekly Exercise (Minutes/Week): 90, Intensity: Mild, Exercise limited by: orthopedic condition(s)   Goals Addressed             This Visit's Progress    DIET - INCREASE WATER INTAKE   On track      Depression Screen PHQ 2/9 Scores 06/10/2021 06/07/2021 02/08/2021 07/30/2020 06/09/2020 01/31/2020 07/31/2019  PHQ - 2 Score 0 0 0 0 0 0 0  PHQ- 9 Score 0 0 - - - - 0    Fall Risk Fall Risk  06/10/2021 06/07/2021 02/08/2021 07/30/2020 06/09/2020  Falls in  the past year? 0 0 0 0 0  Number falls in past yr: 0 0 0 0 0  Injury  with Fall? 0 0 0 0 0  Risk for fall due to : No Fall Risks No Fall Risks No Fall Risks - No Fall Risks  Follow up Falls prevention discussed Falls prevention discussed Falls prevention discussed - Falls prevention discussed    FALL RISK PREVENTION PERTAINING TO THE HOME:  Any stairs in or around the home? Yes  If so, are there any without handrails? No  Home free of loose throw rugs in walkways, pet beds, electrical cords, etc? Yes  Adequate lighting in your home to reduce risk of falls? Yes   ASSISTIVE DEVICES UTILIZED TO PREVENT FALLS:  Life alert? No  Use of a cane, walker or w/c? No  Grab bars in the bathroom? No  Shower chair or bench in shower? No  Elevated toilet seat or a handicapped toilet? Yes   TIMED UP AND GO:  Was the test performed? Yes .  Length of time to ambulate 10 feet: 6 sec.   Gait steady and fast without use of assistive device  Cognitive Function: Cognitive status assessed by direct observation. Patient has current diagnosis of mild cognitive impairment. MMSE completed 06/07/21.     MMSE - Mini Mental State Exam 06/07/2021 05/12/2016 05/13/2015  Orientation to time 3 5 5   Orientation to Place 4 5 5   Registration 3 3 3   Attention/ Calculation 5 5 5   Recall 0 3 3  Language- name 2 objects 2 2 2   Language- repeat 1 1 1   Language- follow 3 step command 3 3 3   Language- read & follow direction 1 1 1   Write a sentence 1 1 1   Copy design 0 1 1  Total score 23 30 30      6CIT Screen 06/09/2020 05/21/2019 05/15/2018 05/12/2017  What Year? 0 points 0 points 0 points 0 points  What month? 0 points 0 points 0 points 0 points  What time? 0 points 0 points 0 points 0 points  Count back from 20 0 points 0 points 0 points 0 points  Months in reverse 0 points 0 points 2 points 0 points  Repeat phrase 6 points 4 points 2 points 0 points  Total Score 6 4 4  0    Immunizations Immunization History   Administered Date(s) Administered   Fluad Quad(high Dose 65+) 01/25/2019, 01/31/2020, 02/08/2021   Influenza Split 02/19/2012   Influenza-Unspecified 02/08/2013, 02/09/2016, 02/20/2017, 02/02/2018   PFIZER(Purple Top)SARS-COV-2 Vaccination 06/03/2019, 06/24/2019, 02/14/2020, 08/07/2020, 02/25/2021   Pneumococcal Conjugate-13 05/14/2015   Pneumococcal Polysaccharide-23 03/21/2012   Tdap 02/28/2011, 03/28/2011   Zoster Recombinat (Shingrix) 10/29/2019, 12/31/2019    TDAP status: Due, Education has been provided regarding the importance of this vaccine. Advised may receive this vaccine at local pharmacy or Health Dept. Aware to provide a copy of the vaccination record if obtained from local pharmacy or Health Dept. Verbalized acceptance and understanding.  Flu Vaccine status: Up to date  Pneumococcal vaccine status: Up to date  Covid-19 vaccine status: Completed vaccines  Qualifies for Shingles Vaccine? Yes   Zostavax completed No   Shingrix Completed?: Yes  Screening Tests Health Maintenance  Topic Date Due   TETANUS/TDAP  03/27/2021   COVID-19 Vaccine (6 - Booster for Pfizer series) 04/22/2021   Pneumonia Vaccine 82+ Years old  Completed   INFLUENZA VACCINE  Completed   Zoster Vaccines- Shingrix  Completed   HPV VACCINES  Aged Out    Health Maintenance  Health Maintenance Due  Topic Date Due   TETANUS/TDAP  03/27/2021   COVID-19  Vaccine (6 - Booster for Pfizer series) 04/22/2021    Colorectal cancer screening: No longer required.   Lung Cancer Screening: (Low Dose CT Chest recommended if Age 54-80 years, 30 pack-year currently smoking OR have quit w/in 15years.) does not qualify.   Additional Screening:  Hepatitis C Screening: does not qualify  Vision Screening: Recommended annual ophthalmology exams for early detection of glaucoma and other disorders of the eye. Is the patient up to date with their annual eye exam?  Yes  Who is the provider or what is the name of  the office in which the patient attends annual eye exams? [facility].   Dental Screening: Recommended annual dental exams for proper oral hygiene  Community Resource Referral / Chronic Care Management: CRR required this visit?  No   CCM required this visit?  No      Plan:     I have personally reviewed and noted the following in the patients chart:   Medical and social history Use of alcohol, tobacco or illicit drugs  Current medications and supplements including opioid prescriptions. Patient is not currently taking opioid prescriptions. Functional ability and status Nutritional status Physical activity Advanced directives List of other physicians Hospitalizations, surgeries, and ER visits in previous 12 months Vitals Screenings to include cognitive, depression, and falls Referrals and appointments  In addition, I have reviewed and discussed with patient certain preventive protocols, quality metrics, and best practice recommendations. A written personalized care plan for preventive services as well as general preventive health recommendations were provided to patient.     Clemetine Marker, LPN   05/09/5724   Nurse Notes: none

## 2021-06-22 ENCOUNTER — Other Ambulatory Visit: Payer: Self-pay

## 2021-06-22 ENCOUNTER — Other Ambulatory Visit (INDEPENDENT_AMBULATORY_CARE_PROVIDER_SITE_OTHER): Payer: PPO

## 2021-06-22 DIAGNOSIS — D485 Neoplasm of uncertain behavior of skin: Secondary | ICD-10-CM | POA: Diagnosis not present

## 2021-06-22 DIAGNOSIS — C44529 Squamous cell carcinoma of skin of other part of trunk: Secondary | ICD-10-CM | POA: Diagnosis not present

## 2021-06-22 DIAGNOSIS — D2272 Melanocytic nevi of left lower limb, including hip: Secondary | ICD-10-CM | POA: Diagnosis not present

## 2021-06-22 DIAGNOSIS — D225 Melanocytic nevi of trunk: Secondary | ICD-10-CM | POA: Diagnosis not present

## 2021-06-22 DIAGNOSIS — L570 Actinic keratosis: Secondary | ICD-10-CM | POA: Diagnosis not present

## 2021-06-22 DIAGNOSIS — I4821 Permanent atrial fibrillation: Secondary | ICD-10-CM | POA: Diagnosis not present

## 2021-06-22 DIAGNOSIS — X32XXXA Exposure to sunlight, initial encounter: Secondary | ICD-10-CM | POA: Diagnosis not present

## 2021-06-22 DIAGNOSIS — Z79899 Other long term (current) drug therapy: Secondary | ICD-10-CM | POA: Diagnosis not present

## 2021-06-22 DIAGNOSIS — D2271 Melanocytic nevi of right lower limb, including hip: Secondary | ICD-10-CM | POA: Diagnosis not present

## 2021-06-22 DIAGNOSIS — Z85828 Personal history of other malignant neoplasm of skin: Secondary | ICD-10-CM | POA: Diagnosis not present

## 2021-06-23 LAB — DIGOXIN LEVEL: Digoxin, Serum: <0.4 ng/mL — ABNORMAL LOW (ref 0.5–0.9)

## 2021-07-02 DIAGNOSIS — L570 Actinic keratosis: Secondary | ICD-10-CM | POA: Diagnosis not present

## 2021-07-02 DIAGNOSIS — D045 Carcinoma in situ of skin of trunk: Secondary | ICD-10-CM | POA: Diagnosis not present

## 2021-07-02 DIAGNOSIS — C44529 Squamous cell carcinoma of skin of other part of trunk: Secondary | ICD-10-CM | POA: Diagnosis not present

## 2021-07-05 ENCOUNTER — Other Ambulatory Visit: Payer: Self-pay | Admitting: Internal Medicine

## 2021-07-05 ENCOUNTER — Other Ambulatory Visit: Payer: Self-pay | Admitting: Family Medicine

## 2021-07-05 DIAGNOSIS — F5101 Primary insomnia: Secondary | ICD-10-CM

## 2021-07-14 ENCOUNTER — Telehealth: Payer: PPO

## 2021-07-16 ENCOUNTER — Other Ambulatory Visit: Payer: Self-pay | Admitting: Neurology

## 2021-07-16 DIAGNOSIS — I482 Chronic atrial fibrillation, unspecified: Secondary | ICD-10-CM | POA: Diagnosis not present

## 2021-07-16 DIAGNOSIS — R202 Paresthesia of skin: Secondary | ICD-10-CM | POA: Diagnosis not present

## 2021-07-16 DIAGNOSIS — R413 Other amnesia: Secondary | ICD-10-CM

## 2021-07-16 DIAGNOSIS — G250 Essential tremor: Secondary | ICD-10-CM | POA: Diagnosis not present

## 2021-07-16 DIAGNOSIS — R200 Anesthesia of skin: Secondary | ICD-10-CM | POA: Diagnosis not present

## 2021-07-19 ENCOUNTER — Other Ambulatory Visit (HOSPITAL_COMMUNITY): Payer: Self-pay | Admitting: Neurology

## 2021-07-19 DIAGNOSIS — R413 Other amnesia: Secondary | ICD-10-CM

## 2021-07-30 DIAGNOSIS — L821 Other seborrheic keratosis: Secondary | ICD-10-CM | POA: Diagnosis not present

## 2021-07-30 DIAGNOSIS — L298 Other pruritus: Secondary | ICD-10-CM | POA: Diagnosis not present

## 2021-07-30 DIAGNOSIS — Z85828 Personal history of other malignant neoplasm of skin: Secondary | ICD-10-CM | POA: Diagnosis not present

## 2021-07-30 DIAGNOSIS — D2262 Melanocytic nevi of left upper limb, including shoulder: Secondary | ICD-10-CM | POA: Diagnosis not present

## 2021-07-30 DIAGNOSIS — D485 Neoplasm of uncertain behavior of skin: Secondary | ICD-10-CM | POA: Diagnosis not present

## 2021-07-30 DIAGNOSIS — X32XXXA Exposure to sunlight, initial encounter: Secondary | ICD-10-CM | POA: Diagnosis not present

## 2021-07-30 DIAGNOSIS — D225 Melanocytic nevi of trunk: Secondary | ICD-10-CM | POA: Diagnosis not present

## 2021-07-30 DIAGNOSIS — L820 Inflamed seborrheic keratosis: Secondary | ICD-10-CM | POA: Diagnosis not present

## 2021-07-30 DIAGNOSIS — L570 Actinic keratosis: Secondary | ICD-10-CM | POA: Diagnosis not present

## 2021-07-30 DIAGNOSIS — D2261 Melanocytic nevi of right upper limb, including shoulder: Secondary | ICD-10-CM | POA: Diagnosis not present

## 2021-08-05 ENCOUNTER — Other Ambulatory Visit: Payer: Self-pay | Admitting: Family Medicine

## 2021-08-05 DIAGNOSIS — I2510 Atherosclerotic heart disease of native coronary artery without angina pectoris: Secondary | ICD-10-CM

## 2021-08-05 DIAGNOSIS — E785 Hyperlipidemia, unspecified: Secondary | ICD-10-CM

## 2021-08-06 NOTE — Progress Notes (Signed)
Name: Chris Price   MRN: [id number]    DOB: 10-05-1940   Date:08/09/2021 ? ?     Progress Note ? ?Subjective ? ?Chief Complaint ? ?Follow Up ? ?HPI ? ?Chronic afib: he sees Dr. Jens Som, he denies chest pain,  palpitation but has SOB with activity ( like working on his yard) , he has a pacemaker , he is on beta-blocker and lisinopril. ?  ?Chronic bronchitis: : he never smoked, second hand smoking as a child - both parents smoked - He has chronic cough that is productive and clear to white,  no wheezing, he has sob during activity but stable and likely multifactorial .  He does not want to do any inhalers  ?  ?CHF :He has mild lower extremity edema.  He sees cardiologist, Dr. Caryl Comes but not seen in a while. He has been off  spironolactone back on low dose  beta-blocker, and also on low dose of lisinopril, he is still doing his own yard and has SOB with moderate activity.  ? ?Last Echo 08/2020 showed EF of 30-35 % global hypokinesis  ? ?CAD : last cardiac cath was 2015, on statin therapy-Crestor 40 mg and Zetia 10 mg but LDL not at goal, Discussed PCSK9 with patient today but not interested due to cost  ?  ?OSA/CPAP: he is very compliant with CPAP, he wakes up feeling refreshed in the mornings. Unchanged  ?  ?Pre-diabetes: last hgbA1C was slightly lower at 5.7 % , he likes ice cream but only twice a week, denies polyphagia, polydipsia or polyuria. Not interested at this time ?  ?HTN: bp at home has been lower than usual,  denies chest pain or palpitatio. He gets dizzy when he stands up quickly but only seldom. He will see Dr. Caryl Comes tomorrow and does not want to change anything at this time  ?  ?Paresthesia both feet: going on since 2007 after back surgery - Dr. Rennis Harding, but getting progressively worse. B12 and folate are normal, he is now on gabapentin given by Dr. [name] for tremors but continues to have leg numbness. Stable.  ?  ?Insomnia he is off Trazodone due to vivid dreams but doing well on seroquel and is able  to fall and stay asleep . We confirmed correct medication with his wife ?  ?Tremors hands: going on for years, but worse since 2019 , seen at [facility] , he had a second opinion done at [facility] neurology. He is back seeing Dr. [name] and had labs done recently that were normal, going to have MRI brain soon Dr. [name] started him on Gabapentin but patient is not sure if helping with symptoms  ? ?Hearing loss: he sees ENT and is now wearing a hearing aid . Stable.  ? ?BPH with LUTS: seeing Urologist , seeing Dr. Erlene Quan yearly now and is doing well , nocturia once per night  Unchanged  ? ?Senile purpura: both arms, reassurance given . Stable, recently biopsies done by dermatologist  ? ? ?Patient Active Problem List  ? Diagnosis Date Noted  ? Mild cognitive impairment 06/07/2021  ? Permanent atrial fibrillation ([place]) 12/31/2020  ? Senile purpura ([place]) 01/31/2020  ? Primary insomnia 01/31/2020  ? Hearing loss, sensorineural 09/12/2017  ? Prediabetes 07/01/2017  ? Hx of colonic polyps 06/19/2016  ? Long-term use of high-risk medication 06/19/2016  ? Cardiomyopathy ([place]) 07/14/2015  ? Low back pain due to displacement of intervertebral disc 04/09/2015  ? Diarrhea following gastrointestinal surgery 09/04/2013  ?  S/P laparoscopic cholecystectomy 08/20/2013  ? Chronic atrial fibrillation ([place]) 07/15/2013  ? Morbid obesity ([place]) 02/28/2013  ? OSA on CPAP 01/03/2013  ? Hyperlipidemia with target low density lipoprotein (LDL) cholesterol less than 70 mg/dL 03/22/2012  ? ED (erectile dysfunction) of organic origin 01/23/2012  ? Benign localized prostatic hyperplasia with lower urinary tract symptoms (LUTS) 01/23/2012  ? Bladder outlet obstruction 12/25/2011  ? Essential tremor 12/22/2011  ? Hypertension   ? Elevated PSA, less than 10 ng/ml 09/20/2011  ? 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 Chronic systolic heart failure ([name]) 08/25/2011  ? IVCD (intraventricular conduction  defect)   ? Coronary artery disease 08/15/2011  ? ? ?Past Surgical History:  ?Procedure Laterality Date  ? CARDIAC CATHETERIZATION  08/15/2011  ? [facility]   ? CARDIAC CATHETERIZATION  07/09/2013  ? [facility]  ? CARDIOVERSION  08/2011  ? [facility]  ? CHOLECYSTECTOMY  April 2015  ? COLONOSCOPY WITH PROPOFOL N/A 07/30/2015  ? Procedure: COLONOSCOPY WITH PROPOFOL;  Surgeon: Lollie Sails, MD;  Location[facility] ENDOSCOPY;  Service: Endoscopy;  Laterality: N/A;  ? cooled thermotherapy  2007  ? [place], complicated by incontinence  ? HERNIA REPAIR    ? ICD  09/08/2011  ? ICD GENERATOR CHANGEOUT N/A 03/08/2017  ? Procedure: ICD GENERATOR CHANGEOUT;  Surgeon: [name], MD;  Location: [place] CV LAB;  Service: Cardiovascular;  Laterality: N/A;  ? IMPLANTABLE CARDIOVERTER DEFIBRILLATOR IMPLANT  09/08/2011  ? Procedure: IMPLANTABLE CARDIOVERTER DEFIBRILLATOR IMPLANT;  Surgeon: [name], MD;  Location: [facility] CATH LAB;  Service: Cardiovascular;;  ? LUMBAR LAMINECTOMY    ? L2 through S1 with wide decompression of the thecal sac and nerve roots.  ? PROSTATE BIOPSY  2013  ? ? ?Family History  ?Problem Relation Age of Onset  ? Heart attack Father   ? Heart attack Brother   ? Heart attack Son   ? Ovarian cancer Daughter   ? ? ?Social History  ? ?Tobacco Use  ? Smoking status: Never  ? Smokeless tobacco: Never  ?Substance Use Topics  ? Alcohol use: No  ? ? ? ?Current Outpatient Medications:  ?  B Complex-Folic Acid (SUPER B COMPLEX MAXI) TABS, , Disp: , Rfl:  ?  bisoprolol (ZEBETA) 5 MG tablet, Take 1 tablet (5 mg total) by mouth 2 (two) times daily., Disp: 180 tablet, Rfl: 1 ?  digoxin (LANOXIN) 0.125 MG tablet, Take 1 tablet (0.125 mg total) by mouth daily., Disp: 90 tablet, Rfl: 3 ?  empagliflozin (JARDIANCE) 10 MG TABS tablet, Take 1 tablet (10 mg total) by mouth daily., Disp: 90 tablet, Rfl: 3 ?  ezetimibe (ZETIA) 10 MG tablet, TAKE ONE TABLET BY MOUTH EVERY DAY, Disp: 90 tablet, Rfl: 1 ?  finasteride (PROSCAR) 5 MG tablet, Take 1 tablet  (5 mg total) by mouth daily., Disp: 90 tablet, Rfl: 3 ?  furosemide (LASIX) 20 MG tablet, TAKE ONE TABLET BY MOUTH EVERY DAY, Disp: 180 tablet, Rfl: 0 ?  gabapentin (NEURONTIN) 300 MG capsule, Take 300 mg by mouth 2 (two) times daily., Disp: , Rfl:  ?  lisinopril (ZESTRIL) 2.5 MG tablet, Take 1 tablet (2.5 mg total) by mouth daily., Disp: 90 tablet, Rfl: 3 ?  loratadine (CLARITIN) 10 MG tablet, TAKE ONE TABLET BY MOUTH DAILY AS NEEDED FOR ALLERGIES, Disp: 90 tablet, Rfl: 1 ?  multivitamin (ONE-A-DAY MEN'S) TABS tablet, Take 1 tablet by mouth daily., Disp: , Rfl:  ?  nitroGLYCERIN (NITROSTAT) 0.4 MG SL tablet, Place 1 tablet (  0.4 mg total) under the tongue every 5 (five) minutes as needed for chest pain., Disp: 25 tablet, Rfl: 1 ?  omeprazole (PRILOSEC) 40 MG capsule, Take 1 capsule (40 mg total) by mouth daily before breakfast. (Patient taking differently: Take 40 mg by mouth daily as needed.), Disp: 90 capsule, Rfl: 1 ?  rivaroxaban (XARELTO) 20 MG TABS tablet, TAKE ONE TABLET BY MOUTH DAILY WITH SUPPER, Disp: 90 tablet, Rfl: 1 ?  rosuvastatin (CRESTOR) 40 MG tablet, Take 1 tablet (40 mg total) by mouth daily., Disp: 90 tablet, Rfl: 1 ?  traZODone (DESYREL) 100 MG tablet, Take 100 mg by mouth at bedtime., Disp: , Rfl:  ?  triamcinolone ointment (KENALOG) 0.1 %, Apply 1 application topically daily as needed., Disp: , Rfl:  ?  vitamin C (ASCORBIC ACID) 500 MG tablet, Take 500 mg by mouth daily., Disp: , Rfl:  ? ?Allergies  ?Allergen Reactions  ? Coreg [Carvedilol] Other (See Comments)  ?  hypotension  ? Flomax [Tamsulosin Hcl] Other (See Comments)  ?  Hypotension  ? ? ?I personally reviewed active problem list, medication list, allergies, family history, social history, health maintenance with the patient/caregiver today. ? ? ?ROS ? ?Constitutional: Negative for fever or significant weight change.  ?Respiratory: positive for cough and shortness of breath.   ?Cardiovascular: Negative for chest pain or palpitations.   ?Gastrointestinal: Negative for abdominal pain, no bowel changes.  ?Musculoskeletal: Negative for gait problem or joint swelling.  ?Skin: Negative for rash.  ?Neurological: Negative for dizziness or headache

## 2021-08-09 ENCOUNTER — Encounter: Payer: Self-pay | Admitting: Family Medicine

## 2021-08-09 ENCOUNTER — Ambulatory Visit (INDEPENDENT_AMBULATORY_CARE_PROVIDER_SITE_OTHER): Payer: PPO | Admitting: Family Medicine

## 2021-08-09 VITALS — BP 118/72 | HR 87 | Resp 16 | Ht 66.0 in | Wt 216.0 lb

## 2021-08-09 DIAGNOSIS — I2583 Coronary atherosclerosis due to lipid rich plaque: Secondary | ICD-10-CM

## 2021-08-09 DIAGNOSIS — G3184 Mild cognitive impairment, so stated: Secondary | ICD-10-CM | POA: Diagnosis not present

## 2021-08-09 DIAGNOSIS — I428 Other cardiomyopathies: Secondary | ICD-10-CM

## 2021-08-09 DIAGNOSIS — I5022 Chronic systolic (congestive) heart failure: Secondary | ICD-10-CM | POA: Diagnosis not present

## 2021-08-09 DIAGNOSIS — J410 Simple chronic bronchitis: Secondary | ICD-10-CM | POA: Diagnosis not present

## 2021-08-09 DIAGNOSIS — G8929 Other chronic pain: Secondary | ICD-10-CM

## 2021-08-09 DIAGNOSIS — I482 Chronic atrial fibrillation, unspecified: Secondary | ICD-10-CM | POA: Diagnosis not present

## 2021-08-09 DIAGNOSIS — I2510 Atherosclerotic heart disease of native coronary artery without angina pectoris: Secondary | ICD-10-CM

## 2021-08-09 DIAGNOSIS — N401 Enlarged prostate with lower urinary tract symptoms: Secondary | ICD-10-CM

## 2021-08-09 DIAGNOSIS — E785 Hyperlipidemia, unspecified: Secondary | ICD-10-CM

## 2021-08-09 DIAGNOSIS — K219 Gastro-esophageal reflux disease without esophagitis: Secondary | ICD-10-CM

## 2021-08-09 DIAGNOSIS — D692 Other nonthrombocytopenic purpura: Secondary | ICD-10-CM | POA: Diagnosis not present

## 2021-08-09 DIAGNOSIS — F5101 Primary insomnia: Secondary | ICD-10-CM

## 2021-08-09 DIAGNOSIS — M545 Low back pain, unspecified: Secondary | ICD-10-CM

## 2021-08-09 DIAGNOSIS — G250 Essential tremor: Secondary | ICD-10-CM

## 2021-08-09 DIAGNOSIS — I10 Essential (primary) hypertension: Secondary | ICD-10-CM

## 2021-08-09 MED ORDER — BISOPROLOL FUMARATE 5 MG PO TABS: 5.0000 mg | ORAL_TABLET | Freq: Two times a day (BID) | ORAL | 1 refills | Status: DC

## 2021-08-09 MED ORDER — ROSUVASTATIN CALCIUM 40 MG PO TABS: 40.0000 mg | ORAL_TABLET | Freq: Every day | ORAL | 1 refills | Status: DC

## 2021-08-09 MED ORDER — QUETIAPINE FUMARATE 25 MG PO TABS: 25.0000 mg | ORAL_TABLET | Freq: Every day | ORAL | 1 refills | Status: DC

## 2021-08-10 ENCOUNTER — Ambulatory Visit (INDEPENDENT_AMBULATORY_CARE_PROVIDER_SITE_OTHER): Payer: PPO | Admitting: Internal Medicine

## 2021-08-10 ENCOUNTER — Encounter: Payer: Self-pay | Admitting: Internal Medicine

## 2021-08-10 VITALS — BP 118/76 | HR 71 | Ht 66.0 in | Wt 215.0 lb

## 2021-08-10 DIAGNOSIS — Z79899 Other long term (current) drug therapy: Secondary | ICD-10-CM | POA: Diagnosis not present

## 2021-08-10 DIAGNOSIS — I5022 Chronic systolic (congestive) heart failure: Secondary | ICD-10-CM

## 2021-08-10 DIAGNOSIS — I482 Chronic atrial fibrillation, unspecified: Secondary | ICD-10-CM | POA: Diagnosis not present

## 2021-08-10 DIAGNOSIS — I4821 Permanent atrial fibrillation: Secondary | ICD-10-CM | POA: Diagnosis not present

## 2021-08-10 DIAGNOSIS — I428 Other cardiomyopathies: Secondary | ICD-10-CM | POA: Diagnosis not present

## 2021-08-10 DIAGNOSIS — I10 Essential (primary) hypertension: Secondary | ICD-10-CM

## 2021-08-10 DIAGNOSIS — Z95810 Presence of automatic (implantable) cardiac defibrillator: Secondary | ICD-10-CM | POA: Diagnosis not present

## 2021-08-10 LAB — PACEMAKER DEVICE OBSERVATION

## 2021-08-10 MED ORDER — EMPAGLIFLOZIN 10 MG PO TABS: 10.0000 mg | ORAL_TABLET | Freq: Every day | ORAL | 3 refills | Status: DC

## 2021-08-10 MED ORDER — FUROSEMIDE 20 MG PO TABS: ORAL_TABLET | ORAL | 1 refills | Status: DC

## 2021-08-10 MED ORDER — HYDROCHLOROTHIAZIDE 12.5 MG PO CAPS: ORAL_CAPSULE | ORAL | 1 refills | Status: DC

## 2021-08-10 MED ORDER — BISOPROLOL FUMARATE 5 MG PO TABS: ORAL_TABLET | ORAL | 1 refills | Status: DC

## 2021-08-10 MED ORDER — EMPAGLIFLOZIN 10 MG PO TABS
10.0000 mg | ORAL_TABLET | Freq: Every day | ORAL | 3 refills | Status: DC
Start: 2021-08-10 — End: 2021-08-10

## 2021-08-10 NOTE — Patient Instructions (Addendum)
Medication Instructions:  ?- Your physician has recommended you make the following change in your medication:  ? ?1) DECREASE Bisoprolol 5 mg: ?- take 1 tablet (5 mg) by mouth ONCE daily  ? ?2) CHANGE Lasix (furosemide) 20 mg: ?- take 1 tablet (20 mg) by mouth ONCE daily  ? ?3) START Hydrochlorothiazide (HCTZ) 12.5 mg: ?- take 1 tablet (12.5 mg) by mouth ONCE daily in the morning ? ?4) START Jardiance 10 mg: ?- take 1 tablet (10 mg) by mouth ONCE daily  ?(Printed prescription provided to you to see if you can obtain from a [facility])  ? ?*If you need a refill on your cardiac medications before your next appointment, please call your pharmacy* ? ? ?Lab Work: ?- Your physician recommends that you return for lab work in: 2 weeks- BMP ? ?If you have labs (blood work) drawn today and your tests are completely normal, you will receive your results only by: ?MyChart Message (if you have MyChart) OR ?A paper copy in the mail ?If you have any lab test that is abnormal or we need to change your treatment, we will call you to review the results. ? ? ?Testing/Procedures: ?- none ordered ? ? ?Follow-Up: ?At [facility], you and your health needs are our priority.  As part of our continuing mission to provide you with exceptional heart care, we have created designated Provider Care Teams.  These Care Teams include your primary Cardiologist (physician) and Advanced Practice Providers (APPs -  Physician Assistants and Nurse Practitioners) who all work together to provide you with the care you need, when you need it. ? ?We recommend signing up for the patient portal called "MyChart".  Sign up information is provided on this After Visit Summary.  MyChart is used to connect with patients for Virtual Visits (Telemedicine).  Patients are able to view lab/test results, encounter notes, upcoming appointments, etc.  Non-urgent messages can be sent to your provider as well.   ?To learn more about what you can do with MyChart, go  to [email or web address].   ? ?Your next appointment:   ?1) 2 months with the PA/ NP ? ?2) 6 months with Dr. Caryl Comes  ? ?The format for your next appointment:   ?In Person ? ?Provider:   ?Virl Axe, MD  ? ? ?Other Instructions ? ?Hydrochlorothiazide Capsules or Tablets ?What is this medication? ?HYDROCHLOROTHIAZIDE (hye droe klor oh THYE a zide) treats high blood pressure. It may also be used to reduce swelling related to heart, kidney, or liver disease. It helps your kidneys remove more fluid and salt from your blood through the urine. It belongs to a group of medications called diuretics. ?This medicine may be used for other purposes; ask your health care provider or pharmacist if you have questions. ?COMMON BRAND NAME(S): Esidrix, Ezide, HydroDIURIL, Microzide, Oretic, Zide ?What should I tell my care team before I take this medication? ?They need to know if you have any of these conditions: ?Diabetes ?Gout ?Kidney disease ?Liver disease ?Lupus ?Pancreatitis ?An unusual or allergic reaction to hydrochlorothiazide, sulfa drugs, other medications, foods, dyes, or preservatives ?Pregnant or trying to get pregnant ?Breast-feeding ?How should I use this medication? ?Take this medication by mouth. Take it as directed on the prescription label at the same time every day. You can take it with or without food. If it upsets your stomach, take it with food. Keep taking it unless your care team tells you to stop. ?Talk to your care team about the use  of this medication in children. While it may be prescribed for children as young as newborns for selected conditions, precautions do apply. ?Overdosage: If you think you have taken too much of this medicine contact a poison control center or emergency room at once. ?NOTE: This medicine is only for you. Do not share this medicine with others. ?What if I miss a dose? ?If you miss a dose, take it as soon as you can. If it is almost time for your next dose, take only that dose.  Do not take double or extra doses. ?What may interact with this medication? ?Cholestyramine ?Colestipol ?Digoxin ?Dofetilide ?Lithium ?Medications for blood pressure ?Medications for diabetes ?Medications that relax muscles for surgery ?Other diuretics ?Steroid medications like prednisone or cortisone ?This list may not describe all possible interactions. Give your health care provider a list of all the medicines, herbs, non-prescription drugs, or dietary supplements you use. Also tell them if you smoke, drink alcohol, or use illegal drugs. Some items may interact with your medicine. ?What should I watch for while using this medication? ?Visit your health care provider for regular check-ups. Check your blood pressure as directed. Ask your health care provider what your blood pressure should be. Also, find out when you should contact him or her. ?Do not treat yourself for coughs, colds, or pain while you are using this medication without asking your health care provider for advice. Some medications may increase your blood pressure. ?You may get drowsy or dizzy. Do not drive, use machinery, or do anything that needs mental alertness until you know how this medication affects you. Do not stand or sit up quickly, especially if you are an older patient. This reduces the risk of dizzy or fainting spells. Alcohol can make you more drowsy and dizzy. Avoid alcoholic drinks. ?Talk to your health care professional about your risk of skin cancer. You may be more at risk for skin cancer if you take this medication. ?This medication can make you more sensitive to the sun. Keep out of the sun. If you cannot avoid being in the sun, wear protective clothing and use sunscreen. Do not use sun lamps or tanning beds/booths. ?You may need to be on a special diet while taking this medication. Ask your health care provider. Also, find out how many glasses of fluids you need to drink each day. ?Check with your health care provider if you  get an attack of severe diarrhea, nausea and vomiting, or if you sweat a lot. The loss of too much body fluid can make it dangerous for you to take this medication. ?This medication may increase blood sugar. Ask your healthcare provider if changes in diet or medications are needed if you have diabetes. ?What side effects may I notice from receiving this medication? ?Side effects that you should report to your care team as soon as possible: ?Allergic reactions--skin rash, itching, hives, swelling of the face, lips, tongue, or throat ?Dehydration--increased thirst, dry mouth, feeling faint or lightheaded, headache, dark yellow or brown urine ?Gout--severe pain, redness, warmth, or swelling in joints, such as the big toe ?Kidney injury--decrease in the amount of urine, swelling of the ankles, hands, or feet ?Low blood pressure--dizziness, feeling faint or lightheaded, blurry vision ?Low potassium level--muscle pain or cramps, unusual weakness, fatigue, fast or irregular heartbeat, constipation ?Sudden eye pain or change in vision such as blurred vision, seeing halos around lights, vision loss ?Side effects that usually do not require medical attention (report to your care team if  they continue or are bothersome): ?Change in sex drive or performance ?Headache ?Upset stomach ?This list may not describe all possible side effects. Call your doctor for medical advice about side effects. You may report side effects to FDA at 1-800-FDA-1088. ?Where should I keep my medication? ?Keep out of the reach of children and pets. ?Store at room temperature between 20 and 25 degrees C (68 and 77 degrees F). Protect from light and moisture. Keep the container tightly closed. Do not freeze. Get rid of any unused medication after the expiration date. ?To get rid of medications that are no longer needed or have expired: ?Take the medication to a medication take-back program. Check with your pharmacy or law enforcement to find a  location. ?If you cannot return the medication, check the label or package insert to see if the medication should be thrown out in the garbage or flushed down the toilet. If you are not sure, ask your care team. If

## 2021-08-10 NOTE — Progress Notes (Signed)
Patient ID: Chris Price, male   DOB: 15-Jun-1940, 81 y.o.   MRN: [id number] ? ?Patient Care Team: ?[name], MD as PCP - General (Family Medicine) ?[name], MD as Consulting Physician (Cardiology) ?[name], MD as Consulting Physician (Urology) ?[name], [facility] (Pharmacist) ? ? ?HPI ? ?Chris Price is a 81 y.o. male ?Seen in followup for an ICD implanted (DOI 2013/ gen change 10/18)for primary prevention in nonischemic cardiomyopathy further complicated by sinus bradycardia/ brady  ? ?Atrial fibrillation, managed previously with amiodarone but now permanent.   Anticoagulation with Rivaroxaban    ? ?Has had hypotension limiting rate control  and cardiomyopathy drugs ( ie aldactone/entresto)    ? ?Because of dyspnea on exertion 1/23, bisoprolol was increased, digoxin added in [place], (from [place]) was anticipated but not fulfilled ? ?With the changing of his diuretic from furosemide 20 daily--40 every other day, he has suffered with frequent loose stool on those days. ? ?His dyspnea is considerably better on a day-to-day activities, he still finds it limiting however when he tries to work hard in the yard. ?  ?   ?DATE Test EF%   ?3/15 Cath  35 % Nonobstructive disease  ?4/22  Echo 30-35%   ?4/22 Myoview  35% No ischemia   ?     ?  ?Date Cr K Hgb Dig  ?10/18 0.95 4.9    ? 3/20 0.96 4.5 14.8   ?3/21 1.03 4.3 14.9   ?11/21 1.0 4.2 15.6   ?4/22 1.01 4.1 14.4   ?1/23 0.87 4.7 15.9 <0.4  ?      ? ?Thromboembolic risk factors ( age  -2, HTN-1, Vasc disease -1, CHF-1,) for a CHADSVASc Score of 5 ? ?Past Medical History:  ?Diagnosis Date  ? Chronic HFrEF (heart failure with reduced ejection fraction) ([place])   ? a. 08/2020 Echo: EF 30-35%, glob HK, mild LVH. Low nl RV fxn. Sev BAE. Mild MR. Mild-mod TR. Mild AI. Ao sclerosis.  ? Degenerative disk disease   ? lumbar  ? Disc herniation   ? GERD (gastroesophageal reflux disease)   ? Hyperlipidemia   ? Hypertension   ? ICD (implantable  cardiac defibrillator) in place   ? a. 02/2017 gen change to MDT MRI compatible single lead device. Ser # [id number] H.  ? IVCD (intraventricular conduction defect)   ? Nonspecific QRS duration 122  ? Lumbar spinal stenosis   ? Neuromuscular disorder ([place])   ? neauropathy   / siatica  ? NICM (nonischemic cardiomyopathy) ([place])   ? a. 07/2011 Echo: EF 25-30%-->s/p ICD; b. 02/2017 s/p ICD gen change; c. 08/2020 Echo: EF 30-35%.  ? Non-obstructive Coronary artery disease 08/15/2011  ? a. 08/2011 Cath: Moderate nonobstructive CAD; b. 07/2013 Cath: LM nl, LAD 47m D1 50/765mLCX 5076mCA 30p, 50d-->Med rx; c. 08/2020 MV: EF 35%. No ischemia. Fixed apical defect.  ? Permanent atrial fibrillation ([place] ? Sleep apnea   ? uses cpap  ? ? ?Past Surgical History:  ?Procedure Laterality Date  ? CARDIAC CATHETERIZATION  08/15/2011  ? [facility]   ? CARDIAC [place]HETERIZATION  07/09/2013  ? [place] CARDIOVERSION  08/2011  ? [facility]  ? CHOLECYSTECTOMY  April 2015  ? COLONOSCOPY WITH PROPOFOL N/A 07/30/2015  ? Procedure: COLONOSCOPY WITH PROPOFOL;  Surgeon: MarLollie SailsD;  [facility];  Service: Endoscopy;  Laterality: N/A;  ? cooled thermotherapy  2007  ? [place], complicated by incontinence  ? HERNIA REPAIR    ?  ICD  09/08/2011  ? ICD GENERATOR CHANGEOUT N/A 03/08/2017  ? Procedure: ICD GENERATOR CHANGEOUT;  Surgeon: [name], MD;  Location: [facility] CV LAB;  Service: Cardiovascular;  Laterality: N/A;  ? IMPLANTABLE CARDIOVERTER DEFIBRILLATOR IMPLANT  09/08/2011  ? Procedure: IMPLANTABLE CARDIOVERTER DEFIBRILLATOR IMPLANT;  Surgeon: [name], MD;  Location: [facility] CATH LAB;  Service: Cardiovascular;;  ? LUMBAR LAMINECTOMY    ? L2 through S1 with wide decompression of the thecal sac and nerve roots.  ? PROSTATE BIOPSY  2013  ? ? ?Current Outpatient Medications  ?Medication Sig Dispense Refill  ? B Complex-Folic Acid (SUPER B COMPLEX MAXI) TABS     ? bisoprolol (ZEBETA) 5 MG tablet Take 1 tablet (5 mg total) by mouth 2 (two)  times daily. 180 tablet 1  ? digoxin (LANOXIN) 0.125 MG tablet Take 1 tablet (0.125 mg total) by mouth daily. 90 tablet 3  ? empagliflozin (JARDIANCE) 10 MG TABS tablet Take 1 tablet (10 mg total) by mouth daily. 90 tablet 3  ? ezetimibe (ZETIA) 10 MG tablet TAKE ONE TABLET BY MOUTH EVERY DAY 90 tablet 1  ? finasteride (PROSCAR) 5 MG tablet Take 1 tablet (5 mg total) by mouth daily. 90 tablet 3  ? furosemide (LASIX) 20 MG tablet TAKE ONE TABLET BY MOUTH EVERY DAY 180 tablet 0  ? gabapentin (NEURONTIN) 300 MG capsule Take 300 mg by mouth 2 (two) times daily.    ? lisinopril (ZESTRIL) 2.5 MG tablet Take 1 tablet (2.5 mg total) by mouth daily. 90 tablet 3  ? loratadine (CLARITIN) 10 MG tablet TAKE ONE TABLET BY MOUTH DAILY AS NEEDED FOR ALLERGIES 90 tablet 1  ? multivitamin (ONE-A-DAY MEN'S) TABS tablet Take 1 tablet by mouth daily.    ? nitroGLYCERIN (NITROSTAT) 0.4 MG SL tablet Place 1 tablet (0.4 mg total) under the tongue every 5 (five) minutes as needed for chest pain. 25 tablet 1  ? omeprazole (PRILOSEC) 40 MG capsule Take 1 capsule (40 mg total) by mouth daily before breakfast. (Patient taking differently: Take 40 mg by mouth daily as needed.) 90 capsule 1  ? QUEtiapine (SEROQUEL) 25 MG tablet Take 1 tablet (25 mg total) by mouth at bedtime. 90 tablet 1  ? rivaroxaban (XARELTO) 20 MG TABS tablet TAKE ONE TABLET BY MOUTH DAILY WITH SUPPER 90 tablet 1  ? rosuvastatin (CRESTOR) 40 MG tablet Take 1 tablet (40 mg total) by mouth daily. 90 tablet 1  ? triamcinolone ointment (KENALOG) 0.1 % Apply 1 application topically daily as needed.    ? vitamin C (ASCORBIC ACID) 500 MG tablet Take 500 mg by mouth daily.    ? ?No current facility-administered medications for this visit.  ? ? ?Allergies  ?Allergen Reactions  ? Coreg [Carvedilol] Other (See Comments)  ?  hypotension  ? Flomax [Tamsulosin Hcl] Other (See Comments)  ?  Hypotension  ? ? ?Review of Systems negative except from HPI and PMH ? ?Physical Exam: ?BP 118/76 (BP  Location: Left Arm, Patient Position: Sitting, Cuff Size: Normal)   Pulse 71   Ht '5\' 6"'$  (1.676 m)   Wt 215 lb (97.5 kg)   SpO2 94%   BMI 34.70 kg/m?  ?Well developed and well nourished in no acute distress ?HENT normal ?Neck supple with JVP-10 positive HJR ?Clear ?Device pocket well healed; without hematoma or erythema.  There is no tethering  ?Irregularly Irregular rate and rhythm with  controlled  ventricular response , no  gallop No  murmur ?Abd-soft with  active BS ?No Clubbing cyanosis 1-2+ edema ?Skin-warm and dry ?A & Oriented  Grossly normal sensory and motor function ? ?ECG atrial fibrillation at 71 ?Intervals-/15/45 ?Right bundle branch block left anterior fascicular block ? ?ECG 4 /22 was associated with left bundle branch block ? ?Assessment and  Plan ? ?NICM ? ?Congestive heart failure-acute/chronic-systolic grade 3 ? ?Afib-permanent ? ?Hypotension limiting guideline directed therapy ? ?IVCD/LBBB ? ?Tremor ? ?ICD-MDT   ? ?Bradycardia  4% Vp' increased to 9% ? ?Diarrhea ?  ? ?A atrial fibrillation is permanent.  Heart rate excursion is improved following the up titration of his bisoprolol and the addition of digoxin.  No significant improvement however in exertional tolerance.  He is also developed significant diarrhea associated with his higher dose of furosemide.  This was not present on the lower dose and reviewing the literature, diarrhea I learned today is a common side effect of furosemide.  And seemingly dose-related.  Hence, we will decrease his furosemide back to 40 every other day to 20 daily and to augment diuresis and hope to avoid the diarrhea we will add a low-dose hydrochlorothiazide at 12.5.  I do not expect that we would get augmentation of diuresis with spironolactone so we will hold off on that for now. ? ?He has not heard back from the [place] concerning [place].  We will BMET handheld prescription that they can fax. ? ?His ventricular pacing percentage is increased as  anticipated.  Given the issues of blood pressure limitation, we will back back down to daily, continuing the digoxin for augmented rate control.  We may have further room to add spironolactone ?  ?No bleeding.

## 2021-08-24 ENCOUNTER — Other Ambulatory Visit (INDEPENDENT_AMBULATORY_CARE_PROVIDER_SITE_OTHER): Payer: PPO

## 2021-08-24 DIAGNOSIS — Z79899 Other long term (current) drug therapy: Secondary | ICD-10-CM | POA: Diagnosis not present

## 2021-08-24 DIAGNOSIS — I5022 Chronic systolic (congestive) heart failure: Secondary | ICD-10-CM | POA: Diagnosis not present

## 2021-08-24 DIAGNOSIS — I10 Essential (primary) hypertension: Secondary | ICD-10-CM | POA: Diagnosis not present

## 2021-08-24 DIAGNOSIS — I428 Other cardiomyopathies: Secondary | ICD-10-CM

## 2021-08-24 DIAGNOSIS — I4821 Permanent atrial fibrillation: Secondary | ICD-10-CM

## 2021-08-24 DIAGNOSIS — I482 Chronic atrial fibrillation, unspecified: Secondary | ICD-10-CM | POA: Diagnosis not present

## 2021-08-25 LAB — CUP PACEART INCLINIC DEVICE CHECK
Battery Remaining Longevity: 70 mo
Battery Voltage: 2.99 V
Brady Statistic AP VP Percent: 0 %
Brady Statistic AP VS Percent: 0 %
Brady Statistic AS VP Percent: 5.6 %
Brady Statistic AS VS Percent: 94.4 %
Brady Statistic RA Percent Paced: 0 %
Brady Statistic RV Percent Paced: 9.76 %
Date Time Interrogation Session: 20230404092100
HighPow Impedance: 60 Ohm
Implantable Lead Implant Date: 20130502
Implantable Lead Implant Date: 20130502
Implantable Lead Location: 753859
Implantable Lead Location: 753860
Implantable Lead Model: 5076
Implantable Lead Model: 6935
Implantable Pulse Generator Implant Date: 20181031
Lead Channel Impedance Value: 380 Ohm
Lead Channel Impedance Value: 456 Ohm
Lead Channel Impedance Value: 513 Ohm
Lead Channel Pacing Threshold Amplitude: 0.75 V
Lead Channel Pacing Threshold Pulse Width: 0.4 ms
Lead Channel Sensing Intrinsic Amplitude: 2.875 mV
Lead Channel Sensing Intrinsic Amplitude: 3.25 mV
Lead Channel Setting Pacing Amplitude: 2.5 V
Lead Channel Setting Pacing Pulse Width: 0.4 ms
Lead Channel Setting Sensing Sensitivity: 0.3 mV

## 2021-08-25 LAB — BASIC METABOLIC PANEL
BUN/Creatinine Ratio: 16 (ref 10–24)
BUN: 17 mg/dL (ref 8–27)
CO2: 23 mmol/L (ref 20–29)
Calcium: 9.3 mg/dL (ref 8.6–10.2)
Chloride: 103 mmol/L (ref 96–106)
Creatinine, Ser: 1.06 mg/dL (ref 0.76–1.27)
Glucose: 123 mg/dL — ABNORMAL HIGH (ref 70–99)
Potassium: 3.9 mmol/L (ref 3.5–5.2)
Sodium: 145 mmol/L — ABNORMAL HIGH (ref 134–144)
eGFR: 70 mL/min/{1.73_m2} (ref >59)

## 2021-08-27 ENCOUNTER — Telehealth: Payer: Self-pay | Admitting: Internal Medicine

## 2021-08-27 DIAGNOSIS — I5022 Chronic systolic (congestive) heart failure: Secondary | ICD-10-CM

## 2021-08-27 DIAGNOSIS — I428 Other cardiomyopathies: Secondary | ICD-10-CM

## 2021-08-27 DIAGNOSIS — Z79899 Other long term (current) drug therapy: Secondary | ICD-10-CM

## 2021-08-27 NOTE — Telephone Encounter (Signed)
Attempted to call the patient to discuss follow up lab work. ?No answer- I left a message to please call back.  ?

## 2021-08-27 NOTE — Telephone Encounter (Signed)
[name], MD  ?08/26/2021 10:22 AM EDT   ?  ?Please Inform Patient that K is ok ?I called the patient.  His diarrhea is better on the 20 mg of furosemide compared to 40, but still having at some.  In combination with hydrochlorothiazide his edema seems to be a day.  I suggested we go to 20 mg of furosemide every other day. ?  ?Labs are normal x borderline elevated sodium ?  ?We should recheck the metabolic profile in about 2 weeks ?  ?Thanks   ? ?

## 2021-08-30 DIAGNOSIS — M9901 Segmental and somatic dysfunction of cervical region: Secondary | ICD-10-CM | POA: Diagnosis not present

## 2021-08-30 DIAGNOSIS — M50322 Other cervical disc degeneration at C5-C6 level: Secondary | ICD-10-CM | POA: Diagnosis not present

## 2021-08-30 DIAGNOSIS — M9903 Segmental and somatic dysfunction of lumbar region: Secondary | ICD-10-CM | POA: Diagnosis not present

## 2021-08-30 DIAGNOSIS — M5136 Other intervertebral disc degeneration, lumbar region: Secondary | ICD-10-CM | POA: Diagnosis not present

## 2021-08-31 ENCOUNTER — Ambulatory Visit (INDEPENDENT_AMBULATORY_CARE_PROVIDER_SITE_OTHER): Payer: PPO

## 2021-08-31 DIAGNOSIS — M9903 Segmental and somatic dysfunction of lumbar region: Secondary | ICD-10-CM | POA: Diagnosis not present

## 2021-08-31 DIAGNOSIS — M50322 Other cervical disc degeneration at C5-C6 level: Secondary | ICD-10-CM | POA: Diagnosis not present

## 2021-08-31 DIAGNOSIS — M5136 Other intervertebral disc degeneration, lumbar region: Secondary | ICD-10-CM | POA: Diagnosis not present

## 2021-08-31 DIAGNOSIS — M9901 Segmental and somatic dysfunction of cervical region: Secondary | ICD-10-CM | POA: Diagnosis not present

## 2021-08-31 DIAGNOSIS — I428 Other cardiomyopathies: Secondary | ICD-10-CM | POA: Diagnosis not present

## 2021-08-31 LAB — CUP PACEART REMOTE DEVICE CHECK
Battery Remaining Longevity: 71 mo
Battery Voltage: 2.99 V
Brady Statistic AP VP Percent: 0 %
Brady Statistic AP VS Percent: 0 %
Brady Statistic AS VP Percent: 4.94 %
Brady Statistic AS VS Percent: 95.06 %
Brady Statistic RA Percent Paced: 0 %
Brady Statistic RV Percent Paced: 7.89 %
Date Time Interrogation Session: 20230425012306
HighPow Impedance: 71 Ohm
Implantable Lead Implant Date: 20130502
Implantable Lead Implant Date: 20130502
Implantable Lead Location: 753859
Implantable Lead Location: 753860
Implantable Lead Model: 5076
Implantable Lead Model: 6935
Implantable Pulse Generator Implant Date: 20181031
Lead Channel Impedance Value: 380 Ohm
Lead Channel Impedance Value: 456 Ohm
Lead Channel Impedance Value: 570 Ohm
Lead Channel Pacing Threshold Amplitude: 0.75 V
Lead Channel Pacing Threshold Pulse Width: 0.4 ms
Lead Channel Sensing Intrinsic Amplitude: 3.625 mV
Lead Channel Sensing Intrinsic Amplitude: 3.625 mV
Lead Channel Setting Pacing Amplitude: 2.5 V
Lead Channel Setting Pacing Pulse Width: 0.4 ms
Lead Channel Setting Sensing Sensitivity: 0.3 mV

## 2021-09-01 MED ORDER — FUROSEMIDE 20 MG PO TABS: ORAL_TABLET | ORAL | 1 refills | Status: DC

## 2021-09-01 NOTE — Telephone Encounter (Signed)
I called and spoke with the patient's wife, Judeen Hammans (ok per [facility]) and advised that Dr. Caryl Comes had recommended repeat lab work in ~ 2 weeks from when he spoke with the patient about his lab results. ? ?Judeen Hammans was aware of the medication changes advised by Dr. Caryl Comes to decrease furosemide to 20 mg QOD. ?She is aware the patient should have a repeat BMP next week at the [facility]. ? ?She voices understanding and is agreeable. ?She will notify the patient to go next week to the hospital for his lab work. ? ?She was appreciative of the call back. ? ? ?

## 2021-09-01 NOTE — Addendum Note (Signed)
Addended byAlvis Lemmings C on: 09/01/2021 11:43 AM ? ? Modules accepted: Orders ? ?

## 2021-09-02 DIAGNOSIS — M9903 Segmental and somatic dysfunction of lumbar region: Secondary | ICD-10-CM | POA: Diagnosis not present

## 2021-09-02 DIAGNOSIS — M50322 Other cervical disc degeneration at C5-C6 level: Secondary | ICD-10-CM | POA: Diagnosis not present

## 2021-09-02 DIAGNOSIS — M9901 Segmental and somatic dysfunction of cervical region: Secondary | ICD-10-CM | POA: Diagnosis not present

## 2021-09-02 DIAGNOSIS — M5136 Other intervertebral disc degeneration, lumbar region: Secondary | ICD-10-CM | POA: Diagnosis not present

## 2021-09-03 ENCOUNTER — Ambulatory Visit (HOSPITAL_COMMUNITY)
Admission: RE | Admit: 2021-09-03 | Discharge: 2021-09-03 | Disposition: A | Discharge status: Home / Self Care | Payer: PPO | Source: Ambulatory Visit | Attending: Neurology | Admitting: Neurology

## 2021-09-03 ENCOUNTER — Ambulatory Visit (INDEPENDENT_AMBULATORY_CARE_PROVIDER_SITE_OTHER): Payer: PPO | Admitting: Podiatry

## 2021-09-03 DIAGNOSIS — B351 Tinea unguium: Secondary | ICD-10-CM

## 2021-09-03 DIAGNOSIS — R413 Other amnesia: Secondary | ICD-10-CM

## 2021-09-03 DIAGNOSIS — L600 Ingrowing nail: Secondary | ICD-10-CM | POA: Diagnosis not present

## 2021-09-03 DIAGNOSIS — M79674 Pain in right toe(s): Secondary | ICD-10-CM | POA: Diagnosis not present

## 2021-09-03 DIAGNOSIS — M79675 Pain in left toe(s): Secondary | ICD-10-CM

## 2021-09-03 DIAGNOSIS — R251 Tremor, unspecified: Secondary | ICD-10-CM | POA: Diagnosis not present

## 2021-09-03 NOTE — Progress Notes (Signed)
? ?  SUBJECTIVE ?Patient presents to office today complaining of elongated, thickened nails that cause pain while ambulating in shoes.  Patient is unable to trim their own nails. Patient is here for further evaluation and treatment. ? ?Past Medical History:  ?Diagnosis Date  ? Chronic HFrEF (heart failure with reduced ejection fraction) ([place])   ? a. 08/2020 Echo: EF 30-35%, glob HK, mild LVH. Low nl RV fxn. Sev BAE. Mild MR. Mild-mod TR. Mild AI. Ao sclerosis.  ? Degenerative disk disease   ? lumbar  ? Disc herniation   ? GERD (gastroesophageal reflux disease)   ? Hyperlipidemia   ? Hypertension   ? ICD (implantable cardiac defibrillator) in place   ? a. 02/2017 gen change to MDT MRI compatible single lead device. Ser # [id number] H.  ? IVCD (intraventricular conduction defect)   ? Nonspecific QRS duration 122  ? Lumbar spinal stenosis   ? Neuromuscular disorder ([place])   ? neauropathy   / siatica  ? NICM (nonischemic cardiomyopathy) ([place])   ? a. 07/2011 Echo: EF 25-30%-->s/p ICD; b. 02/2017 s/p ICD gen change; c. 08/2020 Echo: EF 30-35%.  ? Non-obstructive Coronary artery disease 08/15/2011  ? a. 08/2011 Cath: Moderate nonobstructive CAD; b. 07/2013 Cath: LM nl, LAD 23m D1 50/750mLCX 5066mCA 30p, 50d-->Med rx; c. 08/2020 MV: EF 35%. No ischemia. Fixed apical defect.  ? Permanent atrial fibrillation ([place] ? Sleep apnea   ? uses cpap  ? ? ?OBJECTIVE ?General Patient is awake, alert, and oriented x 3 and in no acute distress. ?Derm Skin is dry and supple bilateral. Negative open lesions or macerations. Remaining integument unremarkable. Nails are tender, long, thickened and dystrophic with subungual debris, consistent with onychomycosis, 1-5 bilateral. No signs of infection noted. ?Vasc  DP and PT pedal pulses palpable bilaterally. Temperature gradient within normal limits.  ?Neuro Epicritic and protective threshold sensation grossly intact bilaterally.  ?Musculoskeletal Exam No symptomatic pedal deformities noted  bilateral. Muscular strength within normal limits. ? ?ASSESSMENT ?1.  Pain due to onychomycosis of toenails both ?2.  Mild ingrowing toenails bilateral great toes ? ?PLAN OF CARE ?1. Patient evaluated today.  ?2. Instructed to maintain good pedal hygiene and foot care.  ?3. Mechanical debridement of nails 1-5 bilaterally performed using a nail nipper. Filed with dremel without incident.  ?4.  Return to clinic as needed.  If the patient does not get significant relief with mechanical debridement of the nails we may need to proceed with partial nail matricectomy next visit. ? ? ?BreEdrick KinsPM ?[facility]?Dr. BreEdrick KinsPM  ?  ?[address]                                   ?[place] [id number]             ?Office [phone] [phone]? ? ? ?

## 2021-09-03 NOTE — Progress Notes (Signed)
Patient here today at [facility] for MRI brain wo contrast. Patient has medtronic device. CLE sent. Orders for ODO. Will re-program once scan is complete. ?

## 2021-09-06 DIAGNOSIS — M9903 Segmental and somatic dysfunction of lumbar region: Secondary | ICD-10-CM | POA: Diagnosis not present

## 2021-09-06 DIAGNOSIS — M5136 Other intervertebral disc degeneration, lumbar region: Secondary | ICD-10-CM | POA: Diagnosis not present

## 2021-09-06 DIAGNOSIS — M9901 Segmental and somatic dysfunction of cervical region: Secondary | ICD-10-CM | POA: Diagnosis not present

## 2021-09-06 DIAGNOSIS — M50322 Other cervical disc degeneration at C5-C6 level: Secondary | ICD-10-CM | POA: Diagnosis not present

## 2021-09-07 ENCOUNTER — Ambulatory Visit: Payer: Self-pay | Admitting: *Deleted

## 2021-09-07 ENCOUNTER — Encounter: Payer: Self-pay | Admitting: Internal Medicine

## 2021-09-07 DIAGNOSIS — M5136 Other intervertebral disc degeneration, lumbar region: Secondary | ICD-10-CM | POA: Diagnosis not present

## 2021-09-07 DIAGNOSIS — M9903 Segmental and somatic dysfunction of lumbar region: Secondary | ICD-10-CM | POA: Diagnosis not present

## 2021-09-07 DIAGNOSIS — M50322 Other cervical disc degeneration at C5-C6 level: Secondary | ICD-10-CM | POA: Diagnosis not present

## 2021-09-07 DIAGNOSIS — M9901 Segmental and somatic dysfunction of cervical region: Secondary | ICD-10-CM | POA: Diagnosis not present

## 2021-09-07 NOTE — Telephone Encounter (Signed)
I returned call to pt's wife who is on the [facility].   Pt is co lower back pain for a week   Wife requesting a muscle relaxer be prescribed. ? ?Attempted to return the call however the voice mailbox is full and unable to accept messages at this time. ?Will try again later. ? ? ?

## 2021-09-07 NOTE — Telephone Encounter (Signed)
?  Chief Complaint: lower back pain- does she think a Cortisone shot would help? ?Symptoms: pinched nerve ?Frequency: patient reports- on/off pain- but pain is worse now than ever ?Pertinent Negatives:  ?Disposition: '[]'$ ED /'[]'$ Urgent Care (no appt availability in office) / '[]'$ Appointment(In office/virtual)/ '[]'$  [place] [facility]/ '[]'$ Home Care/ '[]'$ Refused Recommended Disposition /'[]'$ [employer] [facility]/ '[x]'$  Follow-up with PCP ?Additional Notes: Patient is calling to see if there is medication or injection that PCP thinks would be helpful to him. He is seeing Chiropractor for pinched nerve in his back and is on a stretching program with him. Patient states eh had injection with PCP in past that offered some relief and wants to know if that would be option here. Patient states at his age - he does not want surgery. Patient advised I would send his inquiry to provider- injection would be considered under procedure and provider would have to schedule that  ?

## 2021-09-07 NOTE — Telephone Encounter (Signed)
2nd attempt to return call.  Voice mailbox is full and unable to accept calls at this time. ?

## 2021-09-07 NOTE — Telephone Encounter (Signed)
Spoke with patient, he will schedule an appt to be assessed.  ?

## 2021-09-07 NOTE — Telephone Encounter (Signed)
Reason for Disposition ?? Back pain ? ?Answer Assessment - Initial Assessment Questions ?1. ONSET: "When did the pain begin?"  ?    Sharp pain- LR ?2. LOCATION: "Where does it hurt?" (upper, mid or lower back) ?    Lower right ?3. SEVERITY: "How bad is the pain?"  (e.g., Scale 1-10; mild, moderate, or severe) ?  - MILD (1-3): doesn't interfere with normal activities  ?  - MODERATE (4-7): interferes with normal activities or awakens from sleep  ?  - SEVERE (8-10): excruciating pain, unable to do any normal activities  ?    moderate ?4. PATTERN: "Is the pain constant?" (e.g., yes, no; constant, intermittent)  ?    Comes and goes ?5. RADIATION: "Does the pain shoot into your legs or elsewhere?" ?    Sharp pain lower R- can travel to laft- not often ?6. CAUSE:  "What do you think is causing the back pain?"  ?    Pinched nerve ?7. BACK OVERUSE:  "Any recent lifting of heavy objects, strenuous work or exercise?" ?    no ?8. MEDICATIONS: "What have you taken so far for the pain?" (e.g., nothing, acetaminophen, NSAIDS) ?    Cortisone shot helped - but patient was not in pain as bad ?9. NEUROLOGIC SYMPTOMS: "Do you have any weakness, numbness, or problems with bowel/bladder control?" ?    Diagnosed pinched nerve ?10. OTHER SYMPTOMS: "Do you have any other symptoms?" (e.g., fever, abdominal pain, burning with urination, blood in urine) ?      no ?11. PREGNANCY: "Is there any chance you are pregnant?" (e.g., yes, no; LMP) ?      *No Answer* ? ?Protocols used: Back Pain-A-AH ? ?

## 2021-09-07 NOTE — Telephone Encounter (Signed)
Summary: back discomfort / rx request  ? The patient has experienced back discomfort for roughly a week  ? ?The patient shares that the discomfort is primarily in their lower back  ? ?The patient's wife has requested for the patient to be prescribed a muscle relaxer  ? ?Please contact further when possible   ?  ? ?3rd call attempted to review low back pain sx with patient. No answer, LVMTCB [phone]. ?

## 2021-09-08 ENCOUNTER — Ambulatory Visit (INDEPENDENT_AMBULATORY_CARE_PROVIDER_SITE_OTHER): Payer: PPO | Admitting: Family Medicine

## 2021-09-08 ENCOUNTER — Encounter: Payer: Self-pay | Admitting: Family Medicine

## 2021-09-08 VITALS — BP 134/78 | HR 83 | Temp 98.1°F | Resp 20 | Ht 66.0 in | Wt 207.0 lb

## 2021-09-08 DIAGNOSIS — I482 Chronic atrial fibrillation, unspecified: Secondary | ICD-10-CM

## 2021-09-08 DIAGNOSIS — M545 Low back pain, unspecified: Secondary | ICD-10-CM

## 2021-09-08 DIAGNOSIS — M5136 Other intervertebral disc degeneration, lumbar region: Secondary | ICD-10-CM | POA: Diagnosis not present

## 2021-09-08 DIAGNOSIS — M50322 Other cervical disc degeneration at C5-C6 level: Secondary | ICD-10-CM | POA: Diagnosis not present

## 2021-09-08 DIAGNOSIS — G4733 Obstructive sleep apnea (adult) (pediatric): Secondary | ICD-10-CM | POA: Diagnosis not present

## 2021-09-08 DIAGNOSIS — M9903 Segmental and somatic dysfunction of lumbar region: Secondary | ICD-10-CM | POA: Diagnosis not present

## 2021-09-08 DIAGNOSIS — M9901 Segmental and somatic dysfunction of cervical region: Secondary | ICD-10-CM | POA: Diagnosis not present

## 2021-09-08 MED ORDER — HYDROCODONE-ACETAMINOPHEN 5-325 MG PO TABS: 1.0000 | ORAL_TABLET | Freq: Four times a day (QID) | ORAL | 0 refills | Status: DC | PRN

## 2021-09-08 MED ORDER — LIDOCAINE HCL (PF) 1 % IJ SOLN
2.0000 mL | Freq: Once | INTRAMUSCULAR | Status: AC
  Administered 2021-09-08: 2 mL via INTRADERMAL

## 2021-09-08 MED ORDER — TIZANIDINE HCL 2 MG PO CAPS: 2.0000 mg | ORAL_CAPSULE | Freq: Three times a day (TID) | ORAL | 0 refills | Status: DC

## 2021-09-08 MED ORDER — TRIAMCINOLONE ACETONIDE 40 MG/ML IJ SUSP
40.0000 mg | Freq: Once | INTRAMUSCULAR | Status: AC
  Administered 2021-09-08: 40 mg via INTRAMUSCULAR

## 2021-09-08 MED ORDER — LIDOCAINE 5 % EX PTCH
2.0000 | MEDICATED_PATCH | CUTANEOUS | 0 refills | Status: DC
Start: 2021-09-08 — End: 2022-08-11

## 2021-09-08 NOTE — Progress Notes (Deleted)
Name: Chris Price   MRN: [id number]    DOB: 16-Aug-1940   Date:09/08/2021 ? ?     Progress Note ? ?Subjective ? ?Chief Complaint ? ?Chief Complaint  ?Patient presents with  ? Back Pain  ?  Right, lower. Radiates down leg and sometimes across entire back.   ? ? ?HPI ? ?Low back pain: Mr Gerhold has a history of back surgery and intermittent low back pain. He sees Dr. Freddi Che ( chiropractor ) on a regular basis, however two weeks ago ,24 hours after he mowed his yard he developed severe back pain, it is getting progressively worse. Described as constant and sharp but it can go up to 10/10 with movement , and it is difficulty for him to drive and is afraid to even walk without assistance. He has afib and takes Xarelto and has not been able to take NSAID's. He has been applying ice and trying to sit up right instead of using his recliner. He denies bowel or bladder incontinence .  ? ?Patient Active Problem List  ? Diagnosis Date Noted  ? Mild cognitive impairment 06/07/2021  ? Permanent atrial fibrillation ([place]) 12/31/2020  ? Senile purpura ([place]) 01/31/2020  ? Primary insomnia 01/31/2020  ? Hearing loss, sensorineural 09/12/2017  ? Prediabetes 07/01/2017  ? Hx of colonic polyps 06/19/2016  ? Long-term use of high-risk medication 06/19/2016  ? Cardiomyopathy ([place]) 07/14/2015  ? Low back pain due to displacement of intervertebral disc 04/09/2015  ? Diarrhea following gastrointestinal surgery 09/04/2013  ? S/P laparoscopic cholecystectomy 08/20/2013  ? Chronic atrial fibrillation ([place]) 07/15/2013  ? Morbid obesity ([place]) 02/28/2013  ? OSA on CPAP 01/03/2013  ? Hyperlipidemia with target low density lipoprotein (LDL) cholesterol less than 70 mg/dL 03/22/2012  ? ED (erectile dysfunction) of organic origin 01/23/2012  ? Benign localized prostatic hyperplasia with lower urinary tract symptoms (LUTS) 01/23/2012  ? Bladder outlet obstruction 12/25/2011  ? Essential tremor 12/22/2011  ? Hypertension   ? Elevated PSA, less than 10  ng/ml 09/20/2011  ? 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 Chronic systolic heart failure ([place]) 08/25/2011  ? IVCD (intraventricular conduction defect)   ? Coronary artery disease 08/15/2011  ? ? ?Social History  ? ?Tobacco Use  ? Smoking status: Never  ? Smokeless tobacco: Never  ?Substance Use Topics  ? Alcohol use: No  ? ? ? ?Current Outpatient Medications:  ?  B Complex-Folic Acid (SUPER B COMPLEX MAXI) TABS, , Disp: , Rfl:  ?  bisoprolol (ZEBETA) 5 MG tablet, Take 1 tablet (5 mg) by mouth once daily, Disp: 90 tablet, Rfl: 1 ?  digoxin (LANOXIN) 0.125 MG tablet, Take 1 tablet (0.125 mg total) by mouth daily., Disp: 90 tablet, Rfl: 3 ?  empagliflozin (JARDIANCE) 10 MG TABS tablet, Take 1 tablet (10 mg total) by mouth daily., Disp: 90 tablet, Rfl: 3 ?  ezetimibe (ZETIA) 10 MG tablet, TAKE ONE TABLET BY MOUTH EVERY DAY, Disp: 90 tablet, Rfl: 1 ?  finasteride (PROSCAR) 5 MG tablet, Take 1 tablet (5 mg total) by mouth daily., Disp: 90 tablet, Rfl: 3 ?  furosemide (LASIX) 20 MG tablet, Take 1 tablet (20 mg) by mouth EVERY OTHER day, Disp: 90 tablet, Rfl: 1 ?  gabapentin (NEURONTIN) 300 MG capsule, Take 300 mg by mouth 2 (two) times daily., Disp: , Rfl:  ?  hydrochlorothiazide (MICROZIDE) 12.5 MG capsule, Take 1 tablet (12.5 mg) by mouth once daily in the morning, Disp: 90 capsule, Rfl: 1 ?  lisinopril (ZESTRIL)  2.5 MG tablet, Take 1 tablet (2.5 mg total) by mouth daily., Disp: 90 tablet, Rfl: 3 ?  loratadine (CLARITIN) 10 MG tablet, TAKE ONE TABLET BY MOUTH DAILY AS NEEDED FOR ALLERGIES, Disp: 90 tablet, Rfl: 1 ?  multivitamin (ONE-A-DAY MEN'S) TABS tablet, Take 1 tablet by mouth daily., Disp: , Rfl:  ?  nitroGLYCERIN (NITROSTAT) 0.4 MG SL tablet, Place 1 tablet (0.4 mg total) under the tongue every 5 (five) minutes as needed for chest pain., Disp: 25 tablet, Rfl: 1 ?  omeprazole (PRILOSEC) 40 MG capsule, Take 1 capsule (40 mg total) by mouth daily before breakfast.  (Patient taking differently: Take 40 mg by mouth daily as needed.), Disp: 90 capsule, Rfl: 1 ?  QUEtiapine (SEROQUEL) 25 MG tablet, Take 1 tablet (25 mg total) by mouth at bedtime., Disp: 90 tablet, Rfl: 1 ?  rivaroxaban (XARELTO) 20 MG TABS tablet, TAKE ONE TABLET BY MOUTH DAILY WITH SUPPER, Disp: 90 tablet, Rfl: 1 ?  rosuvastatin (CRESTOR) 40 MG tablet, Take 1 tablet (40 mg total) by mouth daily., Disp: 90 tablet, Rfl: 1 ?  triamcinolone ointment (KENALOG) 0.1 %, Apply 1 application topically daily as needed., Disp: , Rfl:  ?  vitamin C (ASCORBIC ACID) 500 MG tablet, Take 500 mg by mouth daily., Disp: , Rfl:  ? ?Allergies  ?Allergen Reactions  ? Coreg [Carvedilol] Other (See Comments)  ?  hypotension  ? Flomax [Tamsulosin Hcl] Other (See Comments)  ?  Hypotension  ? ? ?ROS ? ?Constitutional: Negative for fever or weight change.  ?Respiratory: Negative for cough and shortness of breath.   ?Cardiovascular: Negative for chest pain or palpitations.  ?Gastrointestinal: Negative for abdominal pain, no bowel changes.  ?Musculoskeletal: Negative for gait problem or joint swelling.  ?Skin: Negative for rash.  ?Neurological: Negative for dizziness or headache.  ?No other specific complaints in a complete review of systems (except as listed in HPI above).  ? ?Objective ? ?Vitals:  ? 09/08/21 1523  ?BP: 134/78  ?Pulse: 83  ?Resp: 20  ?Temp: 98.1 ?F (36.7 ?C)  ?TempSrc: Oral  ?SpO2: 94%  ?Weight: 207 lb (93.9 kg)  ?Height: '5\' 6"'$  (1.676 m)  ? ? ?Body mass index is 33.41 kg/m?. ? ? ? ?Physical Exam ? ?Constitutional: Patient appears well-developed and well-nourished. Obese  No distress.  ?HEENT: head atraumatic, normocephalic, pupils equal and reactive to light, neck supple ?Cardiovascular: Normal rate, regular rhythm and normal heart sounds.  No murmur heard. No BLE edema. ?Pulmonary/Chest: Effort normal and breath sounds normal. No respiratory distress. ?Abdominal: Soft.  There is no tenderness. ?Psychiatric: Patient has a  normal mood and affect. behavior is normal. Judgment and thought content normal.  ?Muscular Skeletal: negative straight leg raise, slow gait, pain with rom and palpation of right lumbar area, no pain over spinal processes, no rashes, long scar on lumbar spine from previous surgery  ? ?Recent Results (from the past 2160 hour(s))  ?Digoxin level     Status: Abnormal  ? Collection Time: 06/22/21  9:45 AM  ?Result Value Ref Range  ? Digoxin, Serum <0.4 (L) 0.5 - 0.9 ng/mL  ?  Comment: Concentrations above 2.0 ng/mL are generally considered ?toxic. Some overlap of toxic and non-toxic values have ?been reported. ?                            Detection Limit = 0.4 ng/mL ?Therapeutic range is derived from 2013 ACCF/AHA ?Guidelines for the Management of Heart  Failure. ?**Verified by repeat analysis** ?  ?CUP PACEART INCLINIC DEVICE CHECK     Status: None  ? Collection Time: 08/10/21  9:21 AM  ?Result Value Ref Range  ? Date Time Interrogation Session [phone]   ? Pulse Insurance claims handler MERM   ? Pulse Gen Model [id number] Evera MRI XT DR   ? Pulse Gen Serial Number [id number]   ? Clinic Name [facility]   ? Implantable Pulse Generator Type Implantable Cardiac Defibulator   ? Implantable Pulse Generator Implant Date [id number]   ? Implantable Lead Manufacturer MERM   ? Implantable Lead Model 5076 CapSureFix Novus   ? Implantable Lead Serial Number [id number]   ? Implantable Lead Implant Date [id number]   ? Implantable Lead Location Detail 1 APPENDAGE   ? Implantable Lead Location [id number]   ? Implantable Lead Manufacturer MERM   ? Implantable Lead Model [id number] Sprint Quattro Secure S   ? Implantable Lead Serial Number [id number]   ? Implantable Lead Implant Date [id number]   ? Implantable Lead Location Detail 1 APEX   ? Implantable Lead Location [phone]   ? Lead Channel Setting Sensing Sensitivity 0.3 mV  ? Lead Channel Setting Pacing Pulse Width 0.4 ms  ? Lead Channel Setting Pacing Amplitude 2.5 V  ? Lead Channel  Impedance Value 456 ohm  ? Lead Channel Impedance Value 513 ohm  ? Lead Channel Impedance Value 380 ohm  ? Lead Channel Sensing Intrinsic Amplitude 3.25 mV  ? Lead Channel Sensing Intrinsic Amplitude 2.875 mV  ? [place] Comment

## 2021-09-08 NOTE — Progress Notes (Deleted)
Name: Chris Price   MRN: [id number]    DOB: 02-03-1941   Date:09/08/2021 ? ?     Progress Note ? ?Subjective ? ?Chief Complaint ? ?Chief Complaint  ?Patient presents with  ? Back Pain  ?  Right, lower.   ? ? ?HPI ? ?Low back pain: Chris Price has a history of back surgery and intermittent low back pain. He sees Dr. Freddi Che ( chiropractor ) on a regular basis, however two weeks ago ,24 hours after he mowed his yard he developed severe back pain, it is getting progressively worse. Described as constant and sharp but it can go up to 10/10 with movement , and it is difficulty for him to drive and is afraid to even walk without assistance. He has afib and takes Xarelto and has not been able to take NSAID's. He has been applying ice and trying to sit up right instead of using his recliner. He denies bowel or bladder incontinence .  ? ?Patient Active Problem List  ? Diagnosis Date Noted  ? Mild cognitive impairment 06/07/2021  ? Permanent atrial fibrillation ([place]) 12/31/2020  ? Senile purpura ([place]) 01/31/2020  ? Primary insomnia 01/31/2020  ? Hearing loss, sensorineural 09/12/2017  ? Prediabetes 07/01/2017  ? Hx of colonic polyps 06/19/2016  ? Long-term use of high-risk medication 06/19/2016  ? Cardiomyopathy ([place]) 07/14/2015  ? Low back pain due to displacement of intervertebral disc 04/09/2015  ? Diarrhea following gastrointestinal surgery 09/04/2013  ? S/P laparoscopic cholecystectomy 08/20/2013  ? Chronic atrial fibrillation ([place]) 07/15/2013  ? Morbid obesity ([place]) 02/28/2013  ? OSA on CPAP 01/03/2013  ? Hyperlipidemia with target low density lipoprotein (LDL) cholesterol less than 70 mg/dL 03/22/2012  ? ED (erectile dysfunction) of organic origin 01/23/2012  ? Benign localized prostatic hyperplasia with lower urinary tract symptoms (LUTS) 01/23/2012  ? Bladder outlet obstruction 12/25/2011  ? Essential tremor 12/22/2011  ? Hypertension   ? Elevated PSA, less than 10 ng/ml 09/20/2011  ? Automatic implantable  cardioverter-defibrillator Medtronic dual-chamber 09/09/2011  ? Chronic systolic heart failure ([place]) 08/25/2011  ? IVCD (intraventricular conduction defect)   ? Coronary artery disease 08/15/2011  ? ? ?Social History  ? ?Tobacco Use  ? Smoking status: Never  ? Smokeless tobacco: Never  ?Substance Use Topics  ? Alcohol use: No  ? ? ? ?Current Outpatient Medications:  ?  B Complex-Folic Acid (SUPER B COMPLEX MAXI) TABS, , Disp: , Rfl:  ?  bisoprolol (ZEBETA) 5 MG tablet, Take 1 tablet (5 mg) by mouth once daily, Disp: 90 tablet, Rfl: 1 ?  digoxin (LANOXIN) 0.125 MG tablet, Take 1 tablet (0.125 mg total) by mouth daily., Disp: 90 tablet, Rfl: 3 ?  empagliflozin (JARDIANCE) 10 MG TABS tablet, Take 1 tablet (10 mg total) by mouth daily., Disp: 90 tablet, Rfl: 3 ?  ezetimibe (ZETIA) 10 MG tablet, TAKE ONE TABLET BY MOUTH EVERY DAY, Disp: 90 tablet, Rfl: 1 ?  finasteride (PROSCAR) 5 MG tablet, Take 1 tablet (5 mg total) by mouth daily., Disp: 90 tablet, Rfl: 3 ?  furosemide (LASIX) 20 MG tablet, Take 1 tablet (20 mg) by mouth EVERY OTHER day, Disp: 90 tablet, Rfl: 1 ?  gabapentin (NEURONTIN) 300 MG capsule, Take 300 mg by mouth 2 (two) times daily., Disp: , Rfl:  ?  hydrochlorothiazide (MICROZIDE) 12.5 MG capsule, Take 1 tablet (12.5 mg) by mouth once daily in the morning, Disp: 90 capsule, Rfl: 1 ?  HYDROcodone-acetaminophen (NORCO/VICODIN) 5-325 MG tablet, Take 1 tablet by mouth  every 6 (six) hours as needed for moderate pain., Disp: 10 tablet, Rfl: 0 ?  lidocaine (LIDODERM) 5 %, Place 2 patches onto the skin daily. Remove & Discard patch within 12 hours or as directed by MD, Disp: 60 patch, Rfl: 0 ?  lisinopril (ZESTRIL) 2.5 MG tablet, Take 1 tablet (2.5 mg total) by mouth daily., Disp: 90 tablet, Rfl: 3 ?  loratadine (CLARITIN) 10 MG tablet, TAKE ONE TABLET BY MOUTH DAILY AS NEEDED FOR ALLERGIES, Disp: 90 tablet, Rfl: 1 ?  multivitamin (ONE-A-DAY MEN'S) TABS tablet, Take 1 tablet by mouth daily., Disp: , Rfl:  ?   nitroGLYCERIN (NITROSTAT) 0.4 MG SL tablet, Place 1 tablet (0.4 mg total) under the tongue every 5 (five) minutes as needed for chest pain., Disp: 25 tablet, Rfl: 1 ?  omeprazole (PRILOSEC) 40 MG capsule, Take 1 capsule (40 mg total) by mouth daily before breakfast. (Patient taking differently: Take 40 mg by mouth daily as needed.), Disp: 90 capsule, Rfl: 1 ?  QUEtiapine (SEROQUEL) 25 MG tablet, Take 1 tablet (25 mg total) by mouth at bedtime., Disp: 90 tablet, Rfl: 1 ?  rivaroxaban (XARELTO) 20 MG TABS tablet, TAKE ONE TABLET BY MOUTH DAILY WITH SUPPER, Disp: 90 tablet, Rfl: 1 ?  rosuvastatin (CRESTOR) 40 MG tablet, Take 1 tablet (40 mg total) by mouth daily., Disp: 90 tablet, Rfl: 1 ?  tizanidine (ZANAFLEX) 2 MG capsule, Take 1 capsule (2 mg total) by mouth 3 (three) times daily., Disp: 30 capsule, Rfl: 0 ?  triamcinolone ointment (KENALOG) 0.1 %, Apply 1 application topically daily as needed., Disp: , Rfl:  ?  vitamin C (ASCORBIC ACID) 500 MG tablet, Take 500 mg by mouth daily., Disp: , Rfl:  ? ?Allergies  ?Allergen Reactions  ? Coreg [Carvedilol] Other (See Comments)  ?  hypotension  ? Flomax [Tamsulosin Hcl] Other (See Comments)  ?  Hypotension  ? ? ?ROS ? ?Constitutional: Negative for fever or weight change.  ?Respiratory: Negative for cough and shortness of breath.   ?Cardiovascular: Negative for chest pain or palpitations.  ?Gastrointestinal: Negative for abdominal pain, no bowel changes.  ?Musculoskeletal: Negative for gait problem or joint swelling.  ?Skin: Negative for rash.  ?Neurological: Negative for dizziness or headache.  ?No other specific complaints in a complete review of systems (except as listed in HPI above).  ? ?Objective ? ?Vitals:  ? 09/08/21 1523  ?BP: 134/78  ?Pulse: 83  ?Resp: 20  ?Temp: 98.1 ?F (36.7 ?C)  ?TempSrc: Oral  ?SpO2: 94%  ?Weight: 207 lb (93.9 kg)  ?Height: '5\' 6"'$  (1.676 m)  ? ? ?Body mass index is 33.41 kg/m?. ? ? ? ?Physical Exam ? ?Constitutional: Patient appears  well-developed and well-nourished. Obese  No distress.  ?HEENT: head atraumatic, normocephalic, pupils equal and reactive to light, neck supple ?Cardiovascular: Normal rate, regular rhythm and normal heart sounds.  No murmur heard. No BLE edema. ?Pulmonary/Chest: Effort normal and breath sounds normal. No respiratory distress. ?Abdominal: Soft.  There is no tenderness. ?Psychiatric: Patient has a normal mood and affect. behavior is normal. Judgment and thought content normal.  ?Muscular Skeletal: negative straight leg raise, slow gait, pain with rom and palpation of right lumbar area, no pain over spinal processes, no rashes, long scar on lumbar spine from previous surgery  ? ?Recent Results (from the past 2160 hour(s))  ?Digoxin level     Status: Abnormal  ? Collection Time: 06/22/21  9:45 AM  ?Result Value Ref Range  ? Digoxin, Serum <0.4 (L)  0.5 - 0.9 ng/mL  ?  Comment: Concentrations above 2.0 ng/mL are generally considered ?toxic. Some overlap of toxic and non-toxic values have ?been reported. ?                            Detection Limit = 0.4 ng/mL ?Therapeutic range is derived from 2013 ACCF/AHA ?Guidelines for the Management of Heart Failure. ?**Verified by repeat analysis** ?  ?CUP PACEART INCLINIC DEVICE CHECK     Status: None  ? Collection Time: 08/10/21  9:21 AM  ?Result Value Ref Range  ? Date Time Interrogation Session [phone]   ? Pulse Insurance claims handler MERM   ? Pulse Gen Model [id number] Evera MRI XT DR   ? Pulse Gen Serial Number [id number]   ? Clinic Name [facility]   ? Implantable Pulse Generator Type Implantable Cardiac Defibulator   ? Implantable Pulse Generator Implant Date [id number]   ? Implantable Lead Manufacturer MERM   ? Implantable Lead Model 5076 CapSureFix Novus   ? Implantable Lead Serial Number [id number]   ? Implantable Lead Implant Date [id number]   ? Implantable Lead Location Detail 1 APPENDAGE   ? Implantable Lead Location [id number]   ? Implantable Lead Manufacturer  MERM   ? Implantable Lead Model [id number] Sprint Quattro Secure S   ? Implantable Lead Serial Number [id number]   ? Implantable Lead Implant Date [id number]   ? Implantable Lead Location Detail 1 APEX   ? Implantable Lead Location [phone]

## 2021-09-08 NOTE — Progress Notes (Signed)
Name: Chris Price   MRN: [id number]    DOB: [birth year]   Date:09/08/2021 ? ?     Progress Note ? ?Subjective ? ?Chief Complaint ? ?Chief Complaint  ?Patient presents with  ? Back Pain  ?  Right, lower.   ? ? ?HPI ? ?Low back pain: Chris Price has a history of back surgery and intermittent low back pain. He sees Chris Price ( chiropractor ) on a regular basis, however two weeks ago ,24 hours after he mowed his yard he developed severe back pain, it is getting progressively worse. Described as constant and sharp but it can go up to 10/10 with movement , and it is difficulty for him to drive and is afraid to even walk without assistance. He has afib and takes Xarelto and has not been able to take NSAID's. He has been applying ice and trying to sit up right instead of using his recliner. He denies bowel or bladder incontinence .  ? ?Patient Active Problem List  ? Diagnosis Date Noted  ? Mild cognitive impairment 06/07/2021  ? Permanent atrial fibrillation ([place]) 12/31/2020  ? Senile purpura ([place]) 01/31/2020  ? Primary insomnia 01/31/2020  ? Hearing loss, sensorineural 09/12/2017  ? Prediabetes 07/01/2017  ? Hx of colonic polyps 06/19/2016  ? Long-term use of high-risk medication 06/19/2016  ? Cardiomyopathy ([place]) 07/14/2015  ? Low back pain due to displacement of intervertebral disc 04/09/2015  ? Diarrhea following gastrointestinal surgery 09/04/2013  ? S/P laparoscopic cholecystectomy 08/20/2013  ? Chronic atrial fibrillation ([place]) 07/15/2013  ? Morbid obesity ([place]) 02/28/2013  ? OSA on CPAP 01/03/2013  ? Hyperlipidemia with target low density lipoprotein (LDL) cholesterol less than 70 mg/dL 03/22/2012  ? ED (erectile dysfunction) of organic origin 01/23/2012  ? Benign localized prostatic hyperplasia with lower urinary tract symptoms (LUTS) 01/23/2012  ? Bladder outlet obstruction 12/25/2011  ? Essential tremor 12/22/2011  ? Hypertension   ? Elevated PSA, less than 10 ng/ml 09/20/2011  ? Automatic implantable  cardioverter-defibrillator Medtronic dual-chamber 09/09/2011  ? Chronic systolic heart failure ([place]) 08/25/2011  ? IVCD (intraventricular conduction defect)   ? Coronary artery disease 08/15/2011  ? ? ?Social History  ? ?Tobacco Use  ? Smoking status: Never  ? Smokeless tobacco: Never  ?Substance Use Topics  ? Alcohol use: No  ? ? ? ?Current Outpatient Medications:  ?  B Complex-Folic Acid (SUPER B COMPLEX MAXI) TABS, , Disp: , Rfl:  ?  bisoprolol (ZEBETA) 5 MG tablet, Take 1 tablet (5 mg) by mouth once daily, Disp: 90 tablet, Rfl: 1 ?  digoxin (LANOXIN) 0.125 MG tablet, Take 1 tablet (0.125 mg total) by mouth daily., Disp: 90 tablet, Rfl: 3 ?  empagliflozin (JARDIANCE) 10 MG TABS tablet, Take 1 tablet (10 mg total) by mouth daily., Disp: 90 tablet, Rfl: 3 ?  ezetimibe (ZETIA) 10 MG tablet, TAKE ONE TABLET BY MOUTH EVERY DAY, Disp: 90 tablet, Rfl: 1 ?  finasteride (PROSCAR) 5 MG tablet, Take 1 tablet (5 mg total) by mouth daily., Disp: 90 tablet, Rfl: 3 ?  furosemide (LASIX) 20 MG tablet, Take 1 tablet (20 mg) by mouth EVERY OTHER day, Disp: 90 tablet, Rfl: 1 ?  gabapentin (NEURONTIN) 300 MG capsule, Take 300 mg by mouth 2 (two) times daily., Disp: , Rfl:  ?  hydrochlorothiazide (MICROZIDE) 12.5 MG capsule, Take 1 tablet (12.5 mg) by mouth once daily in the morning, Disp: 90 capsule, Rfl: 1 ?  HYDROcodone-acetaminophen (NORCO/VICODIN) 5-325 MG tablet, Take 1 tablet by mouth  every 6 (six) hours as needed for moderate pain., Disp: 10 tablet, Rfl: 0 ?  lidocaine (LIDODERM) 5 %, Place 2 patches onto the skin daily. Remove & Discard patch within 12 hours or as directed by MD, Disp: 60 patch, Rfl: 0 ?  lisinopril (ZESTRIL) 2.5 MG tablet, Take 1 tablet (2.5 mg total) by mouth daily., Disp: 90 tablet, Rfl: 3 ?  loratadine (CLARITIN) 10 MG tablet, TAKE ONE TABLET BY MOUTH DAILY AS NEEDED FOR ALLERGIES, Disp: 90 tablet, Rfl: 1 ?  multivitamin (ONE-A-DAY MEN'S) TABS tablet, Take 1 tablet by mouth daily., Disp: , Rfl:  ?   nitroGLYCERIN (NITROSTAT) 0.4 MG SL tablet, Place 1 tablet (0.4 mg total) under the tongue every 5 (five) minutes as needed for chest pain., Disp: 25 tablet, Rfl: 1 ?  omeprazole (PRILOSEC) 40 MG capsule, Take 1 capsule (40 mg total) by mouth daily before breakfast. (Patient taking differently: Take 40 mg by mouth daily as needed.), Disp: 90 capsule, Rfl: 1 ?  QUEtiapine (SEROQUEL) 25 MG tablet, Take 1 tablet (25 mg total) by mouth at bedtime., Disp: 90 tablet, Rfl: 1 ?  rivaroxaban (XARELTO) 20 MG TABS tablet, TAKE ONE TABLET BY MOUTH DAILY WITH SUPPER, Disp: 90 tablet, Rfl: 1 ?  rosuvastatin (CRESTOR) 40 MG tablet, Take 1 tablet (40 mg total) by mouth daily., Disp: 90 tablet, Rfl: 1 ?  tizanidine (ZANAFLEX) 2 MG capsule, Take 1 capsule (2 mg total) by mouth 3 (three) times daily., Disp: 30 capsule, Rfl: 0 ?  triamcinolone ointment (KENALOG) 0.1 %, Apply 1 application topically daily as needed., Disp: , Rfl:  ?  vitamin C (ASCORBIC ACID) 500 MG tablet, Take 500 mg by mouth daily., Disp: , Rfl:  ? ?Allergies  ?Allergen Reactions  ? Coreg [Carvedilol] Other (See Comments)  ?  hypotension  ? Flomax [Tamsulosin Hcl] Other (See Comments)  ?  Hypotension  ? ? ?ROS ? ?Ten systems reviewed and is negative except as mentioned in HPI  ? ?Objective ? ?Vitals:  ? 09/08/21 1523  ?BP: 134/78  ?Pulse: 83  ?Resp: 20  ?Temp: 98.1 ?F (36.7 ?C)  ?TempSrc: Oral  ?SpO2: 94%  ?Weight: 207 lb (93.9 kg)  ?Height: '5\' 6"'$  (1.676 m)  ? ? ?Body mass index is 33.41 kg/m?. ? ? ? ?Physical Exam ? ?Constitutional: Patient appears well-developed and well-nourished. Obese  In mild distress/ in pain  ?HEENT: head atraumatic, normocephalic, strabismus, neck supple, throat within normal limits ?Cardiovascular: Normal rate, irregular rhythm and normal heart sounds.  No murmur heard. No BLE edema. ?Pulmonary/Chest: Effort normal and breath sounds normal. No respiratory distress. ?Abdominal: Soft.  There is no tenderness. ?Muscular skeletal: pain during  palpation of right lumbar spine, negative straight leg raise, large old scar from lumbar spine surgery , pain with movement  ?Psychiatric: Patient has a normal mood and affect. behavior is normal. Judgment and thought content normal.  ? ?Recent Results (from the past 2160 hour(s))  ?Digoxin level     Status: Abnormal  ? Collection Time: 06/22/21  9:45 AM  ?Result Value Ref Range  ? Digoxin, Serum <0.4 (L) 0.5 - 0.9 ng/mL  ?  Comment: Concentrations above 2.0 ng/mL are generally considered ?toxic. Some overlap of toxic and non-toxic values have ?been reported. ?                            Detection Limit = 0.4 ng/mL ?Therapeutic range is derived from 2013 ACCF/AHA ?Guidelines  for the Management of Heart Failure. ?**Verified by repeat analysis** ?  ?CUP PACEART INCLINIC DEVICE CHECK     Status: None  ? Collection Time: 08/10/21  9:21 AM  ?Result Value Ref Range  ? Date Time Interrogation Session [id number]   ? Pulse Insurance claims handler MERM   ? Pulse Gen Model [id number] Evera MRI XT DR   ? Pulse Gen Serial Number [id number]   ? Clinic Name [facility]   ? Implantable Pulse Generator Type Implantable Cardiac Defibulator   ? Implantable Pulse Generator Implant Date [id number]   ? Implantable Lead Manufacturer MERM   ? Implantable Lead Model 5076 CapSureFix Novus   ? Implantable Lead Serial Number [id number]   ? Implantable Lead Implant Date [id number]   ? Implantable Lead Location Detail 1 APPENDAGE   ? Implantable Lead Location [id number]   ? Implantable Lead Manufacturer MERM   ? Implantable Lead Model [id number] Sprint Quattro Secure S   ? Implantable Lead Serial Number [id number]   ? Implantable Lead Implant Date [id number]   ? Implantable Lead Location Detail 1 APEX   ? Implantable Lead Location [phone]   ? Lead Channel Setting Sensing Sensitivity 0.3 mV  ? Lead Channel Setting Pacing Pulse Width 0.4 ms  ? Lead Channel Setting Pacing Amplitude 2.5 V  ? Lead Channel Impedance Value 456 ohm  ? Lead Channel Impedance Value  513 ohm  ? Lead Channel Impedance Value 380 ohm  ? Lead Channel Sensing Intrinsic Amplitude 3.25 mV  ? Lead Channel Sensing Intrinsic Amplitude 2.875 mV  ? Lead Channel Pacing Threshold Amplitude 0.75 V  ? Lead Cha

## 2021-09-08 NOTE — Progress Notes (Deleted)
Name: Chris Price   MRN: [id number]    DOB: Nov 01, 1940   Date:09/08/2021 ? ?     Progress Note ? ?Subjective ? ?Chief Complaint ? ?Acute Right lower back pain  ? ?HPI ? ?*** ?Patient Active Problem List  ? Diagnosis Date Noted  ? Mild cognitive impairment 06/07/2021  ? Permanent atrial fibrillation ([place]) 12/31/2020  ? Senile purpura ([place]) 01/31/2020  ? Primary insomnia 01/31/2020  ? Hearing loss, sensorineural 09/12/2017  ? Prediabetes 07/01/2017  ? Hx of colonic polyps 06/19/2016  ? Long-term use of high-risk medication 06/19/2016  ? Cardiomyopathy ([place]) 07/14/2015  ? Low back pain due to displacement of intervertebral disc 04/09/2015  ? Diarrhea following gastrointestinal surgery 09/04/2013  ? S/P laparoscopic cholecystectomy 08/20/2013  ? Chronic atrial fibrillation ([place]) 07/15/2013  ? Morbid obesity ([employer]) 02/28/2013  ? OSA on CPAP 01/03/2013  ? Hyperlipidemia with target low density lipoprotein (LDL) cholesterol less than 70 mg/dL 03/22/2012  ? ED (erectile dysfunction) of organic origin 01/23/2012  ? Benign localized prostatic hyperplasia with lower urinary tract symptoms (LUTS) 01/23/2012  ? Bladder outlet obstruction 12/25/2011  ? Essential tremor 12/22/2011  ? Hypertension   ? Elevated PSA, less than 10 ng/ml 09/20/2011  ? 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 Chronic systolic heart failure ([place]) 08/25/2011  ? IVCD (intraventricular conduction defect)   ? Coronary artery disease 08/15/2011  ? ? ?Past Surgical History:  ?Procedure Laterality Date  ? CARDIAC CATHETERIZATION  08/15/2011  ? [facility]   ? CARDIAC CATHETERIZATION  07/09/2013  ? [facility]  ? CARDIOVERSION  08/2011  ? [facility]  ? CHOLECYSTECTOMY  April 2015  ? COLONOSCOPY WITH PROPOFOL N/A 07/30/2015  ? Procedure: COLONOSCOPY WITH PROPOFOL;  Surgeon: Lollie Sails, MD;  Location: [facility] ENDOSCOPY;  Service: Endoscopy;  Laterality: N/A;  ? cooled thermotherapy  2007  ? [place], complicated by incontinence  ? HERNIA  REPAIR    ? ICD  09/08/2011  ? ICD GENERATOR CHANGEOUT N/A 03/08/2017  ? Procedure: ICD GENERATOR CHANGEOUT;  Surgeon: [name], MD;  Location: [facility] CV LAB;  Service: Cardiovascular;  Laterality: N/A;  ? IMPLANTABLE CARDIOVERTER DEFIBRILLATOR IMPLANT  09/08/2011  ? Procedure: IMPLANTABLE CARDIOVERTER DEFIBRILLATOR IMPLANT;  Surgeon: [name], MD;  Location: [facility] CATH LAB;  Service: Cardiovascular;;  ? LUMBAR LAMINECTOMY    ? L2 through S1 with wide decompression of the thecal sac and nerve roots.  ? PROSTATE BIOPSY  2013  ? ? ?Family History  ?Problem Relation Age of Onset  ? Heart attack Father   ? Heart attack Brother   ? Heart attack Son   ? Ovarian cancer Daughter   ? ? ?Social History  ? ?Tobacco Use  ? Smoking status: Never  ? Smokeless tobacco: Never  ?Substance Use Topics  ? Alcohol use: No  ? ? ? ?Current Outpatient Medications:  ?  B Complex-Folic Acid (SUPER B COMPLEX MAXI) TABS, , Disp: , Rfl:  ?  bisoprolol (ZEBETA) 5 MG tablet, Take 1 tablet (5 mg) by mouth once daily, Disp: 90 tablet, Rfl: 1 ?  digoxin (LANOXIN) 0.125 MG tablet, Take 1 tablet (0.125 mg total) by mouth daily., Disp: 90 tablet, Rfl: 3 ?  empagliflozin (JARDIANCE) 10 MG TABS tablet, Take 1 tablet (10 mg total) by mouth daily., Disp: 90 tablet, Rfl: 3 ?  ezetimibe (ZETIA) 10 MG tablet, TAKE ONE TABLET BY MOUTH EVERY DAY, Disp: 90 tablet, Rfl: 1 ?  finasteride (PROSCAR) 5 MG tablet, Take 1 tablet (5  mg total) by mouth daily., Disp: 90 tablet, Rfl: 3 ?  furosemide (LASIX) 20 MG tablet, Take 1 tablet (20 mg) by mouth EVERY OTHER day, Disp: 90 tablet, Rfl: 1 ?  gabapentin (NEURONTIN) 300 MG capsule, Take 300 mg by mouth 2 (two) times daily., Disp: , Rfl:  ?  hydrochlorothiazide (MICROZIDE) 12.5 MG capsule, Take 1 tablet (12.5 mg) by mouth once daily in the morning, Disp: 90 capsule, Rfl: 1 ?  lisinopril (ZESTRIL) 2.5 MG tablet, Take 1 tablet (2.5 mg total) by mouth daily., Disp: 90 tablet, Rfl: 3 ?  loratadine (CLARITIN) 10 MG  tablet, TAKE ONE TABLET BY MOUTH DAILY AS NEEDED FOR ALLERGIES, Disp: 90 tablet, Rfl: 1 ?  multivitamin (ONE-A-DAY MEN'S) TABS tablet, Take 1 tablet by mouth daily., Disp: , Rfl:  ?  nitroGLYCERIN (NITROSTAT) 0.4 MG SL tablet, Place 1 tablet (0.4 mg total) under the tongue every 5 (five) minutes as needed for chest pain., Disp: 25 tablet, Rfl: 1 ?  omeprazole (PRILOSEC) 40 MG capsule, Take 1 capsule (40 mg total) by mouth daily before breakfast. (Patient taking differently: Take 40 mg by mouth daily as needed.), Disp: 90 capsule, Rfl: 1 ?  QUEtiapine (SEROQUEL) 25 MG tablet, Take 1 tablet (25 mg total) by mouth at bedtime., Disp: 90 tablet, Rfl: 1 ?  rivaroxaban (XARELTO) 20 MG TABS tablet, TAKE ONE TABLET BY MOUTH DAILY WITH SUPPER, Disp: 90 tablet, Rfl: 1 ?  rosuvastatin (CRESTOR) 40 MG tablet, Take 1 tablet (40 mg total) by mouth daily., Disp: 90 tablet, Rfl: 1 ?  triamcinolone ointment (KENALOG) 0.1 %, Apply 1 application topically daily as needed., Disp: , Rfl:  ?  vitamin C (ASCORBIC ACID) 500 MG tablet, Take 500 mg by mouth daily., Disp: , Rfl:  ? ?Allergies  ?Allergen Reactions  ? Coreg [Carvedilol] Other (See Comments)  ?  hypotension  ? Flomax [Tamsulosin Hcl] Other (See Comments)  ?  Hypotension  ? ? ?I personally reviewed {Reviewed:[id number]} with the patient/caregiver today. ? ? ?ROS ? ?*** ? ?Objective ? ?Vitals:  ? 09/08/21 1523  ?BP: 134/78  ?Pulse: 83  ?Resp: 20  ?Temp: 98.1 ?F (36.7 ?C)  ?TempSrc: Oral  ?SpO2: 94%  ?Weight: 207 lb (93.9 kg)  ?Height: '5\' 6"'$  (1.676 m)  ? ? ?Body mass index is 33.41 kg/m?. ? ?Physical Exam ?*** ? ?Recent Results (from the past 2160 hour(s))  ?Digoxin level     Status: Abnormal  ? Collection Time: 06/22/21  9:45 AM  ?Result Value Ref Range  ? Digoxin, Serum <0.4 (L) 0.5 - 0.9 ng/mL  ?  Comment: Concentrations above 2.0 ng/mL are generally considered ?toxic. Some overlap of toxic and non-toxic values have ?been reported. ?                            Detection Limit = 0.4  ng/mL ?Therapeutic range is derived from 2013 ACCF/AHA ?Guidelines for the Management of Heart Failure. ?**Verified by repeat analysis** ?  ?CUP PACEART INCLINIC DEVICE CHECK     Status: None  ? Collection Time: 08/10/21  9:21 AM  ?Result Value Ref Range  ? Date Time Interrogation Session [phone]   ? Pulse Insurance claims handler MERM   ? Pulse Gen Model [id number] Evera MRI XT DR   ? Pulse Gen Serial Number [id number]   ? Clinic Name [facility]   ? Implantable Pulse Generator Type Implantable Cardiac Defibulator   ? Implantable Pulse Generator Implant Date [id number]   ?  Implantable Lead Manufacturer MERM   ? Implantable Lead Model 5076 CapSureFix Novus   ? Implantable Lead Serial Number [id number]   ? Implantable Lead Implant Date [id number]   ? Implantable Lead Location Detail 1 APPENDAGE   ? Implantable Lead Location [id number]   ? Implantable Lead Manufacturer MERM   ? Implantable Lead Model [id number] Sprint Quattro Secure S   ? Implantable Lead Serial Number [id number]   ? Implantable Lead Implant Date [id number]   ? Implantable Lead Location Detail 1 APEX   ? Implantable Lead Location [phone]   ? Lead Channel Setting Sensing Sensitivity 0.3 mV  ? Lead Channel Setting Pacing Pulse Width 0.4 ms  ? Lead Channel Setting Pacing Amplitude 2.5 V  ? Lead Channel Impedance Value 456 ohm  ? Lead Channel Impedance Value 513 ohm  ? Lead Channel Impedance Value 380 ohm  ? Lead Channel Sensing Intrinsic Amplitude 3.25 mV  ? Lead Channel Sensing Intrinsic Amplitude 2.875 mV  ? Lead Channel Pacing Threshold Amplitude 0.75 V  ? Lead Channel Pacing Threshold Pulse Width 0.4 ms  ? HighPow Impedance 60 ohm  ? Battery Status OK   ? Battery Remaining Longevity 70 mo  ? Battery Voltage 2.99 V  ? Loletha Grayer Statistic RA Percent Paced 0 %  ? Brady Statistic RV Percent Paced 9.76 %  ? Brady Statistic AP VP Percent 0 %  ? Brady Statistic AS VP Percent 5.6 %  ? Brady Statistic AP VS Percent 0 %  ? Brady Statistic AS VS Percent 94.4 %  ? Eval  Rhythm AF/VS   ?Basic metabolic panel     Status: Abnormal  ? Collection Time: 08/24/21  8:57 AM  ?Result Value Ref Range  ? Glucose 123 (H) 70 - 99 mg/dL  ? BUN 17 8 - 27 mg/dL  ? Creatinine, Ser 1.06 0.76 - 1.

## 2021-09-09 DIAGNOSIS — M9901 Segmental and somatic dysfunction of cervical region: Secondary | ICD-10-CM | POA: Diagnosis not present

## 2021-09-09 DIAGNOSIS — M9903 Segmental and somatic dysfunction of lumbar region: Secondary | ICD-10-CM | POA: Diagnosis not present

## 2021-09-09 DIAGNOSIS — M5136 Other intervertebral disc degeneration, lumbar region: Secondary | ICD-10-CM | POA: Diagnosis not present

## 2021-09-09 DIAGNOSIS — M50322 Other cervical disc degeneration at C5-C6 level: Secondary | ICD-10-CM | POA: Diagnosis not present

## 2021-09-10 DIAGNOSIS — M9901 Segmental and somatic dysfunction of cervical region: Secondary | ICD-10-CM | POA: Diagnosis not present

## 2021-09-10 DIAGNOSIS — M9903 Segmental and somatic dysfunction of lumbar region: Secondary | ICD-10-CM | POA: Diagnosis not present

## 2021-09-10 DIAGNOSIS — M50322 Other cervical disc degeneration at C5-C6 level: Secondary | ICD-10-CM | POA: Diagnosis not present

## 2021-09-10 DIAGNOSIS — M5136 Other intervertebral disc degeneration, lumbar region: Secondary | ICD-10-CM | POA: Diagnosis not present

## 2021-09-13 DIAGNOSIS — M9903 Segmental and somatic dysfunction of lumbar region: Secondary | ICD-10-CM | POA: Diagnosis not present

## 2021-09-13 DIAGNOSIS — M9901 Segmental and somatic dysfunction of cervical region: Secondary | ICD-10-CM | POA: Diagnosis not present

## 2021-09-13 DIAGNOSIS — M5136 Other intervertebral disc degeneration, lumbar region: Secondary | ICD-10-CM | POA: Diagnosis not present

## 2021-09-13 DIAGNOSIS — M50322 Other cervical disc degeneration at C5-C6 level: Secondary | ICD-10-CM | POA: Diagnosis not present

## 2021-09-14 DIAGNOSIS — M5136 Other intervertebral disc degeneration, lumbar region: Secondary | ICD-10-CM | POA: Diagnosis not present

## 2021-09-14 DIAGNOSIS — M50322 Other cervical disc degeneration at C5-C6 level: Secondary | ICD-10-CM | POA: Diagnosis not present

## 2021-09-14 DIAGNOSIS — M9903 Segmental and somatic dysfunction of lumbar region: Secondary | ICD-10-CM | POA: Diagnosis not present

## 2021-09-14 DIAGNOSIS — M9901 Segmental and somatic dysfunction of cervical region: Secondary | ICD-10-CM | POA: Diagnosis not present

## 2021-09-16 DIAGNOSIS — M9901 Segmental and somatic dysfunction of cervical region: Secondary | ICD-10-CM | POA: Diagnosis not present

## 2021-09-16 DIAGNOSIS — M50322 Other cervical disc degeneration at C5-C6 level: Secondary | ICD-10-CM | POA: Diagnosis not present

## 2021-09-16 DIAGNOSIS — M9903 Segmental and somatic dysfunction of lumbar region: Secondary | ICD-10-CM | POA: Diagnosis not present

## 2021-09-16 DIAGNOSIS — M5136 Other intervertebral disc degeneration, lumbar region: Secondary | ICD-10-CM | POA: Diagnosis not present

## 2021-09-16 NOTE — Progress Notes (Signed)
Remote ICD transmission.   

## 2021-09-20 ENCOUNTER — Encounter: Payer: Self-pay | Admitting: Internal Medicine

## 2021-09-20 DIAGNOSIS — M9903 Segmental and somatic dysfunction of lumbar region: Secondary | ICD-10-CM | POA: Diagnosis not present

## 2021-09-20 DIAGNOSIS — M50322 Other cervical disc degeneration at C5-C6 level: Secondary | ICD-10-CM | POA: Diagnosis not present

## 2021-09-20 DIAGNOSIS — M9901 Segmental and somatic dysfunction of cervical region: Secondary | ICD-10-CM | POA: Diagnosis not present

## 2021-09-20 DIAGNOSIS — M5136 Other intervertebral disc degeneration, lumbar region: Secondary | ICD-10-CM | POA: Diagnosis not present

## 2021-09-21 DIAGNOSIS — M50322 Other cervical disc degeneration at C5-C6 level: Secondary | ICD-10-CM | POA: Diagnosis not present

## 2021-09-21 DIAGNOSIS — M5136 Other intervertebral disc degeneration, lumbar region: Secondary | ICD-10-CM | POA: Diagnosis not present

## 2021-09-21 DIAGNOSIS — M9901 Segmental and somatic dysfunction of cervical region: Secondary | ICD-10-CM | POA: Diagnosis not present

## 2021-09-21 DIAGNOSIS — M9903 Segmental and somatic dysfunction of lumbar region: Secondary | ICD-10-CM | POA: Diagnosis not present

## 2021-09-23 DIAGNOSIS — M50322 Other cervical disc degeneration at C5-C6 level: Secondary | ICD-10-CM | POA: Diagnosis not present

## 2021-09-23 DIAGNOSIS — M9903 Segmental and somatic dysfunction of lumbar region: Secondary | ICD-10-CM | POA: Diagnosis not present

## 2021-09-23 DIAGNOSIS — M9901 Segmental and somatic dysfunction of cervical region: Secondary | ICD-10-CM | POA: Diagnosis not present

## 2021-09-23 DIAGNOSIS — M5136 Other intervertebral disc degeneration, lumbar region: Secondary | ICD-10-CM | POA: Diagnosis not present

## 2021-09-27 DIAGNOSIS — M9901 Segmental and somatic dysfunction of cervical region: Secondary | ICD-10-CM | POA: Diagnosis not present

## 2021-09-27 DIAGNOSIS — M50322 Other cervical disc degeneration at C5-C6 level: Secondary | ICD-10-CM | POA: Diagnosis not present

## 2021-09-27 DIAGNOSIS — M9903 Segmental and somatic dysfunction of lumbar region: Secondary | ICD-10-CM | POA: Diagnosis not present

## 2021-09-27 DIAGNOSIS — M5136 Other intervertebral disc degeneration, lumbar region: Secondary | ICD-10-CM | POA: Diagnosis not present

## 2021-09-28 DIAGNOSIS — M5136 Other intervertebral disc degeneration, lumbar region: Secondary | ICD-10-CM | POA: Diagnosis not present

## 2021-09-28 DIAGNOSIS — M9901 Segmental and somatic dysfunction of cervical region: Secondary | ICD-10-CM | POA: Diagnosis not present

## 2021-09-28 DIAGNOSIS — M50322 Other cervical disc degeneration at C5-C6 level: Secondary | ICD-10-CM | POA: Diagnosis not present

## 2021-09-28 DIAGNOSIS — M9903 Segmental and somatic dysfunction of lumbar region: Secondary | ICD-10-CM | POA: Diagnosis not present

## 2021-09-30 DIAGNOSIS — M50322 Other cervical disc degeneration at C5-C6 level: Secondary | ICD-10-CM | POA: Diagnosis not present

## 2021-09-30 DIAGNOSIS — M9903 Segmental and somatic dysfunction of lumbar region: Secondary | ICD-10-CM | POA: Diagnosis not present

## 2021-09-30 DIAGNOSIS — M9901 Segmental and somatic dysfunction of cervical region: Secondary | ICD-10-CM | POA: Diagnosis not present

## 2021-09-30 DIAGNOSIS — M5136 Other intervertebral disc degeneration, lumbar region: Secondary | ICD-10-CM | POA: Diagnosis not present

## 2021-10-01 ENCOUNTER — Other Ambulatory Visit: Payer: Self-pay | Admitting: Nurse Practitioner

## 2021-10-12 NOTE — Progress Notes (Unsigned)
Office Visit    Patient Name: Chris Price Date of Encounter: 10/13/2021  Primary Care Provider:  [name], MD Primary Cardiologist:  Virl Axe, MD Primary Electrophysiologist: None  Chief Complaint    Chris Price is a 81 y.o. male with PMH of HFrEF,LBBB, NICM s/p Medtronic ICD, nonobstructive CAD s/p LHC 07/2013, permanent AF, LBBB, HTN, HLD, OSA, GERD presents today for follow-up of systolic heart failure.  Past Medical History    Past Medical History:  Diagnosis Date   Chronic HFrEF (heart failure with reduced ejection fraction) ([place])    a. 08/2020 Echo: EF 30-35%, glob HK, mild LVH. Low nl RV fxn. Sev BAE. Mild MR. Mild-mod TR. Mild AI. Ao sclerosis.   Degenerative disk disease    lumbar   Disc herniation    GERD (gastroesophageal reflux disease)    Hyperlipidemia    Hypertension    ICD (implantable cardiac defibrillator) in place    a. 02/2017 gen change to MDT MRI compatible single lead device. Ser # [id number].   IVCD (intraventricular conduction defect)    Nonspecific QRS duration 122   Lumbar spinal stenosis    Neuromuscular disorder (HCC)    neauropathy   / siatica   NICM (nonischemic cardiomyopathy) ([place])    a. 07/2011 Echo: EF 25-30%-->s/p ICD; b. 02/2017 s/p ICD gen change; c. 08/2020 Echo: EF 30-35%.   Non-obstructive Coronary artery disease 08/15/2011   a. 08/2011 Cath: Moderate nonobstructive CAD; b. 07/2013 Cath: LM nl, LAD 23m D1 50/726mLCX 5081mCA 30p, 50d-->Med rx; c. 08/2020 MV: EF 35%. No ischemia. Fixed apical defect.   [facility]  Sleep apnea    uses cpap   Past Surgical History:  Procedure Laterality Date   CARDIAC CATHETERIZATION  08/15/2011   [facility]    CARDIAC CATHETERIZATION  07/09/2013   [facility]   CARDIOVERSION  08/2011   [facility]   CHOLECYSTECTOMY  April 2015   COLONOSCOPY WITH PROPOFOL N/A 07/30/2015   Procedure: COLONOSCOPY WITH PROPOFOL;  Surgeon: MarLollie SailsD;  [facility];   Service: Endoscopy;  Laterality: N/A;   cooled thermotherapy  [id number], complicated by incontinence   HERNIA REPAIR     ICD  09/08/2011   ICD GENERATOR CHANGEOUT N/A 03/08/2017   Procedure: ICD GENERATOR CHANGEOUT;  Surgeon: KleDeboraha SprangD;  Location: [facility];  Service: Cardiovascular;  Laterality: N/A;   IMPLANTABLE CARDIOVERTER DEFIBRILLATOR IMPLANT  09/08/2011   Procedure: IMPLANTABLE CARDIOVERTER DEFIBRILLATOR IMPLANT;  Surgeon: SteDeboraha SprangD;  [facility];  Service: Cardiovascular;;   LUMBAR LAMINECTOMY     L2 through S1 with wide decompression of the thecal sac and nerve roots.   PROSTATE BIOPSY  2013    Allergies  Allergies  Allergen Reactions   Coreg [Carvedilol] Other (See Comments)    hypotension   Flomax [Tamsulosin Hcl] Other (See Comments)    Hypotension    History of Present Illness    Chris Price a 80 26ar old male with the above-mentioned past medical history who presents today for follow-up of HFrEF.  Patient has history of LHC in 07/2013 that demonstrated nonobstructive CAD with medical management advised. Medtronic ICD was placed in 09/2011 in the setting of persistent cardiomyopathy for primary prevention. He has permanent A-fib that was previously managed with amiodarone that caused intentional tremor and was down titrated and discontinued.  His tremor remains unchanged. He is now rate controlled with bisoprolol  and anticoagulated with Xarelto. GDMT with Entresto and spironolactone has been down titrated due to bouts of hypotension and cost prohibition. On 08/28/20 office visit he was noted to have mild edema, bend apnea with nonexertional chest discomfort. His Lasix was adjusted to every other day dosing and was scheduled for a Lexiscan and echo.  Lexiscan revealed EF 35% with no significant ischemia due to moderately risk and.  2D echo was completed that revealed EF 30-35% with moderately to severely decreased LV function,  bilateral atrial severely dilated, mild MR, mild-moderate TR, mild AI with global hypokinesis and mild LVH.  He was seen in follow-up by Laurann [place], NP on 08/29/20 and reported stable weight with blood pressure routinely at 130/80.  He did endorse some dyspnea on exertion and reported difficulty sleeping.  Wilder Glade was added to his GDMT.He ws seen by Dr. Caryl Comes in 6/22 lisinopril was discontinued and Delene Loll was initiated. When seen two month later in clinic he had discontinued Entresto due to cost and was restarted on Lisinopril. He was seen in January  of this year and was started on digoxin due to depressed LV function. When last seen by Dr. Caryl Comes on 08/2021 his lasix was changed to 20 daily and 40 mg every other day due to frequent diarrhea. HCTZ  at 12.5 was also added  Since last being seen in the office patient reports that he is doing well and tolerating his Jardiance without any side effects.  He is euvolemic on examination and his weight is down 15 pounds from his previous appointment with Dr. Caryl Comes in April. His heart rate is also stable at 60 today and blood pressure is 112/78. He denies chest pain, palpitations, dyspnea, PND, orthopnea, nausea, vomiting, dizziness, syncope, edema, weight gain, or early satiety.   Home Medications    Current Outpatient Medications  Medication Sig Dispense Refill   B Complex-Folic Acid (SUPER B COMPLEX MAXI) TABS Take 1 tablet by mouth daily.     bisoprolol (ZEBETA) 5 MG tablet Take 1 tablet (5 mg) by mouth once daily 90 tablet 1   digoxin (LANOXIN) 0.125 MG tablet Take 1 tablet (0.125 mg total) by mouth daily. 90 tablet 3   empagliflozin (JARDIANCE) 10 MG TABS tablet Take 1 tablet (10 mg total) by mouth daily. 90 tablet 3   ezetimibe (ZETIA) 10 MG tablet TAKE ONE TABLET BY MOUTH EVERY DAY 90 tablet 1   finasteride (PROSCAR) 5 MG tablet Take 1 tablet (5 mg total) by mouth daily. 90 tablet 3   furosemide (LASIX) 20 MG tablet Take 1 tablet (20 mg) by mouth  EVERY OTHER day 90 tablet 1   gabapentin (NEURONTIN) 300 MG capsule Take 300 mg by mouth 2 (two) times daily.     hydrochlorothiazide (MICROZIDE) 12.5 MG capsule Take 1 tablet (12.5 mg) by mouth once daily in the morning 90 capsule 1   lisinopril (ZESTRIL) 2.5 MG tablet Take 1 tablet (2.5 mg total) by mouth daily. 90 tablet 3   loratadine (CLARITIN) 10 MG tablet TAKE ONE TABLET BY MOUTH DAILY AS NEEDED FOR ALLERGIES 90 tablet 1   multivitamin (ONE-A-DAY MEN'S) TABS tablet Take 1 tablet by mouth daily.     nitroGLYCERIN (NITROSTAT) 0.4 MG SL tablet Place 1 tablet (0.4 mg total) under the tongue every 5 (five) minutes as needed for chest pain. 25 tablet 1   omeprazole (PRILOSEC) 40 MG capsule Take 1 capsule (40 mg total) by mouth daily before breakfast. (Patient taking differently: Take 40 mg by  mouth daily as needed.) 90 capsule 1   QUEtiapine (SEROQUEL) 25 MG tablet Take 1 tablet (25 mg total) by mouth at bedtime. 90 tablet 1   rivaroxaban (XARELTO) 20 MG TABS tablet TAKE ONE TABLET BY MOUTH DAILY WITH SUPPER 90 tablet 1   rosuvastatin (CRESTOR) 40 MG tablet Take 1 tablet (40 mg total) by mouth daily. 90 tablet 1   triamcinolone ointment (KENALOG) 0.1 % Apply 1 application topically daily as needed.     vitamin C (ASCORBIC ACID) 500 MG tablet Take 500 mg by mouth daily.     HYDROcodone-acetaminophen (NORCO/VICODIN) 5-325 MG tablet Take 1 tablet by mouth every 6 (six) hours as needed for moderate pain. (Patient not taking: Reported on 10/13/2021) 10 tablet 0   lidocaine (LIDODERM) 5 % Place 2 patches onto the skin daily. Remove & Discard patch within 12 hours or as directed by MD (Patient not taking: Reported on 10/13/2021) 60 patch 0   tizanidine (ZANAFLEX) 2 MG capsule Take 1 capsule (2 mg total) by mouth 3 (three) times daily. (Patient not taking: Reported on 10/13/2021) 30 capsule 0   No current facility-administered medications for this visit.     Review of Systems  Please see the history of  present illness.    (+) Chronic low back pain  All other systems reviewed and are otherwise negative except as noted above.  Physical Exam    Wt Readings from Last 3 Encounters:  10/13/21 200 lb (90.7 kg)  09/08/21 207 lb (93.9 kg)  08/10/21 215 lb (97.5 kg)   VS: Vitals:   10/13/21 0858  BP: 112/78  Pulse: 60  SpO2: 95%  ,Body mass index is 32.28 kg/m.  Constitutional:      Appearance: Healthy appearance. Not in distress.  Neck:     Vascular: JVD normal, no bruit present. Pulmonary:     Effort: Pulmonary effort is normal.     Breath sounds: No wheezing. No rales. Diminished in the bases Cardiovascular:     Normal rate. Regular rhythm. Normal S1. Normal S2.      Murmurs: There is no murmur.  Edema:    Peripheral edema trace tibial in the right lower extremity Abdominal:     Palpations: Abdomen is soft non tender. There is no hepatomegaly.  Skin:    General: Skin is warm and dry.  Neurological:     General: No focal deficit present.     Mental Status: Alert and oriented to person, place and time.     Cranial Nerves: Cranial nerves are intact.  EKG/LABS/Other Studies Reviewed    ECG personally reviewed by me today -atrial fibrillation with a rate of 60 and right bundle branch block with no acute changes.  Risk Assessment/Calculations:    CHA2DS2-VASc Score = 5   This indicates a 7.2% annual risk of stroke. The patient's score is based upon: CHF History: 1 HTN History: 1 Diabetes History: 0 Stroke History: 0 Vascular Disease History: 1 Age Score: 2 Gender Score: 0           Lab Results  Component Value Date   WBC 6.9 06/07/2021   HGB 15.9 06/07/2021   HCT 47.7 06/07/2021   MCV 97.7 06/07/2021   PLT 191 06/07/2021   Lab Results  Component Value Date   CREATININE 1.06 08/24/2021   BUN 17 08/24/2021   NA 145 (H) 08/24/2021   K 3.9 08/24/2021   CL 103 08/24/2021   CO2 23 08/24/2021   Lab Results  Component  Value Date   ALT 28 06/07/2021    AST 32 06/07/2021   ALKPHOS 55 01/08/2021   BILITOT 1.1 06/07/2021   Lab Results  Component Value Date   CHOL 153 01/08/2021   HDL 45 01/08/2021   LDLCALC 91 01/08/2021   LDLDIRECT 143.6 03/21/2012   TRIG 93 01/08/2021   CHOLHDL 3.4 01/08/2021    Lab Results  Component Value Date   HGBA1C 5.7 (H) 07/31/2019    Assessment & Plan    1.  HFrEF: -EF by 2D echo completed in 08/2020 was 30-35%.  Today patient is euvolemic on examination and his weight is down 15 pounds from previous appointment in April.   -Optimization of GDMT is hindered by patient's low blood pressure and intolerance to spironolactone and Delene Loll is cost prohibitive for patient -Continue current GDMT with bisoprolol 5 mg daily, Jardiance 10 mg daily and lisinopril 2.5 mg. -We will continue current Lasix regimen of 20 mg every other day. -If patient's blood pressure improves we may look to add spironolactone 12.5 mg to current regimen. -BMET today -Low sodium diet, fluid restriction <2L, and daily weights encouraged. Educated to contact our office for weight gain of 2 lbs overnight or 5 lbs in one week.   2.  Hypertension: -Blood pressure today was well controlled at 112/78 -Continue current antihypertensive regimen with lisinopril 2.5 mg, and bisoprolol 5 mg  3.  Permanent A-fib: -Patient heart rate well controlled at 60 today and patient denies any palpitations since last appointment. -Continue Xarelto 20 mg, dose appropriate based on creatinine clearance -Continue bisoprolol 5 mg daily and digoxin 0.125 mg daily  4.  Nonischemic cardiomyopathy: -s/p Medtronic ICD 07/2011 -Blood pressure was well controlled today at 112/78 and we will continue current antihypertensive regimen as noted above. -continue current GDMT as noted above  Disposition: Follow-up with Virl Axe, MD as scheduled in September    Medication Adjustments/Labs and Tests Ordered: Current medicines are reviewed at length with the patient  today.  Concerns regarding medicines are outlined above.   Signed, Mable Fill, Marissa Nestle, NP 10/13/2021, 9:49 AM [place]

## 2021-10-13 ENCOUNTER — Other Ambulatory Visit
Admission: RE | Admit: 2021-10-13 | Discharge: 2021-10-13 | Disposition: A | Discharge status: Home / Self Care | Payer: PPO | Attending: Nurse Practitioner | Admitting: Nurse Practitioner

## 2021-10-13 ENCOUNTER — Ambulatory Visit: Payer: PPO | Admitting: Nurse Practitioner

## 2021-10-13 ENCOUNTER — Encounter: Payer: Self-pay | Admitting: Nurse Practitioner

## 2021-10-13 VITALS — BP 112/78 | HR 60 | Ht 66.0 in | Wt 200.0 lb

## 2021-10-13 DIAGNOSIS — I5020 Unspecified systolic (congestive) heart failure: Secondary | ICD-10-CM | POA: Insufficient documentation

## 2021-10-13 DIAGNOSIS — I10 Essential (primary) hypertension: Secondary | ICD-10-CM | POA: Insufficient documentation

## 2021-10-13 DIAGNOSIS — I4821 Permanent atrial fibrillation: Secondary | ICD-10-CM | POA: Insufficient documentation

## 2021-10-13 DIAGNOSIS — I428 Other cardiomyopathies: Secondary | ICD-10-CM | POA: Insufficient documentation

## 2021-10-13 LAB — BASIC METABOLIC PANEL
Anion gap: 7 (ref 5–15)
BUN: 15 mg/dL (ref 8–23)
CO2: 26 mmol/L (ref 22–32)
Calcium: 9.3 mg/dL (ref 8.9–10.3)
Chloride: 106 mmol/L (ref 98–111)
Creatinine, Ser: 0.88 mg/dL (ref 0.61–1.24)
GFR, Estimated: >60 mL/min (ref >60)
Glucose, Bld: 80 mg/dL (ref 70–99)
Potassium: 3.7 mmol/L (ref 3.5–5.1)
Sodium: 139 mmol/L (ref 135–145)

## 2021-10-13 NOTE — Patient Instructions (Signed)
Medication Instructions:  No changes at this time.   *If you need a refill on your cardiac medications before your next appointment, please call your pharmacy*   Lab Work: BMET today over at the [employer] at [facility] Stop at the 1st desk on the right to check in (REGISTRATION)  Lab hours: Monday- Friday (7:30 am- 5:30 pm)  If you have labs (blood work) drawn today and your tests are completely normal, you will receive your results only by: MyChart Message (if you have MyChart) OR A paper copy in the mail If you have any lab test that is abnormal or we need to change your treatment, we will call you to review the results.   Testing/Procedures: None   Follow-Up: At [facility], you and your health needs are our priority.  As part of our continuing mission to provide you with exceptional heart care, we have created designated Provider Care Teams.  These Care Teams include your primary Cardiologist (physician) and Advanced Practice Providers (APPs -  Physician Assistants and Nurse Practitioners) who all work together to provide you with the care you need, when you need it.    Your next appointment:   Keep scheduled appointment with Dr. Caryl Comes on 02/01/22 at 09:00 am  The format for your next appointment:   In Person  Provider:   Virl Axe, MD         Important Information About Sugar

## 2021-10-22 DIAGNOSIS — R202 Paresthesia of skin: Secondary | ICD-10-CM | POA: Diagnosis not present

## 2021-10-22 DIAGNOSIS — I482 Chronic atrial fibrillation, unspecified: Secondary | ICD-10-CM | POA: Diagnosis not present

## 2021-10-22 DIAGNOSIS — R200 Anesthesia of skin: Secondary | ICD-10-CM | POA: Diagnosis not present

## 2021-10-22 DIAGNOSIS — G250 Essential tremor: Secondary | ICD-10-CM | POA: Diagnosis not present

## 2021-10-22 DIAGNOSIS — R413 Other amnesia: Secondary | ICD-10-CM | POA: Diagnosis not present

## 2021-11-02 ENCOUNTER — Telehealth: Payer: Self-pay

## 2021-11-02 NOTE — Progress Notes (Deleted)
Chronic Care Management Pharmacy Note  11/02/2021 Name:  Chris Price MRN:  [id number] DOB:  07/18/1940  Summary: Patient presents for CCM follow-up  Recommendations/Changes made from today's visit: ***   Plan: CPP follow-up 6 months    Subjective: Chris Price is an 81 y.o. year old male who is a primary patient of [name], MD.  The CCM team was consulted for assistance with disease management and care coordination needs.    Engaged with patient by telephone for follow up visit in response to provider referral for pharmacy case management and/or care coordination services.   Consent to Services:  The patient was given information about Chronic Care Management services, agreed to services, and gave verbal consent prior to initiation of services.  Please see initial visit note for detailed documentation.   Patient Care Team: [name], MD as PCP - General (Family Medicine) [name], MD as PCP - Cardiology (Cardiology) [name], MD as Consulting Physician (Cardiology) [name], MD as Consulting Physician (Urology) [name], [facility] (Pharmacist)  Recent office visits: 09/08/21: Patient presented to Dr. [name] for acute back pain. Hydrocodone, lidocaine, tizanidine.   08/09/21: Patient presented to Dr. [name] for bronchitis. Quetiapine 25 mg nightly. Trazodone stopped.  06/07/21: Patient presented to [facility], PA-C for tremor. Referral to neurology.   Recent consult visits: 10/13/21: Patient presented to [name], NP (Cardiology)  07/16/21: Patient presented to Dr. [name] (Neurology). Gabapentin 300 mg twice daily.   Hospital visits: None in previous 6 months   Objective:  Lab Results  Component Value Date   CREATININE 0.88 10/13/2021   BUN 15 10/13/2021   GFR 73.33 06/29/2017   GFRNONAA >60 10/13/2021   GFRAA 79 07/31/2019   NA 139 10/13/2021   K 3.7 10/13/2021   CALCIUM 9.3 10/13/2021   CO2 26 10/13/2021   GLUCOSE  80 10/13/2021    Lab Results  Component Value Date/Time   HGBA1C 5.7 (H) 07/31/2019 11:27 AM   HGBA1C 5.9 (H) 07/17/2018 02:59 PM   HGBA1C 5.9 07/08/2013 12:56 PM   HGBA1C 5.7 08/31/2011 05:25 AM   GFR 73.33 06/29/2017 09:28 AM   GFR 75.19 06/17/2016 11:49 AM   MICROALBUR 2.1 (H) 06/29/2017 09:28 AM    Last diabetic Eye exam: No results found for: "HMDIABEYEEXA"  Last diabetic Foot exam: No results found for: "HMDIABFOOTEX"   Lab Results  Component Value Date   CHOL 153 01/08/2021   HDL 45 01/08/2021   LDLCALC 91 01/08/2021   LDLDIRECT 143.6 03/21/2012   TRIG 93 01/08/2021   CHOLHDL 3.4 01/08/2021       Latest Ref Rng & Units 06/07/2021   10:48 AM 01/08/2021    9:46 AM 07/31/2019   11:27 AM  Hepatic Function  Total Protein 6.1 - 8.1 g/dL 6.8  7.2  6.8   Albumin 3.6 - 4.6 g/dL  4.2    AST 10 - 35 U/L 32  38  37   ALT 9 - 46 U/L 28  33  37   Alk Phosphatase 44 - 121 IU/L  55    Total Bilirubin 0.2 - 1.2 mg/dL 1.1  1.1  1.1     Lab Results  Component Value Date/Time   TSH 1.17 06/07/2021 10:48 AM   TSH 1.14 07/17/2018 02:59 PM       Latest Ref Rng & Units 06/07/2021   10:48 AM 08/21/2020   12:54 PM 07/31/2019   11:27 AM  CBC  WBC  3.8 - 10.8 Thousand/uL 6.9  6.4  5.5   Hemoglobin 13.2 - 17.1 g/dL 15.9  14.4  14.9   Hematocrit 38.5 - 50.0 % 47.7  43.1  45.0   Platelets 140 - 400 Thousand/uL 191  157  184     Lab Results  Component Value Date/Time   VD25OH 43 06/07/2021 10:48 AM   VD25OH 16.25 (L) 06/17/2016 11:49 AM    Clinical ASCVD: No  The ASCVD Risk score (Arnett DK, et al., 2019) failed to calculate for the following reasons:   The 2019 ASCVD risk score is only valid for ages 27 to 65       09/08/2021    3:28 PM 08/09/2021   10:36 AM 06/10/2021    8:28 AM  Depression screen PHQ 2/9  Decreased Interest 0 0 0  Down, Depressed, Hopeless 0 0 0  PHQ - 2 Score 0 0 0  Altered sleeping 0 0 0  Tired, decreased energy 0 0 0  Change in appetite 0 0 0  Feeling  bad or failure about yourself  0 0 0  Trouble concentrating 0 0 0  Moving slowly or fidgety/restless 0 0 0  Suicidal thoughts 0 0 0  PHQ-9 Score 0 0 0  Difficult doing work/chores   Not difficult at all    Social History   Tobacco Use  Smoking Status Never  Smokeless Tobacco Never   BP Readings from Last 3 Encounters:  10/13/21 112/78  09/08/21 134/78  08/10/21 118/76   Pulse Readings from Last 3 Encounters:  10/13/21 60  09/08/21 83  08/10/21 71   Wt Readings from Last 3 Encounters:  10/13/21 200 lb (90.7 kg)  09/08/21 207 lb (93.9 kg)  08/10/21 215 lb (97.5 kg)   BMI Readings from Last 3 Encounters:  10/13/21 32.28 kg/m  09/08/21 33.41 kg/m  08/10/21 34.70 kg/m    Assessment/Interventions: Review of patient past medical history, allergies, medications, health status, including review of consultants reports, laboratory and other test data, was performed as part of comprehensive evaluation and provision of chronic care management services.   SDOH:  (Social Determinants of Health) assessments and interventions performed: Yes   SDOH Screenings   Alcohol Screen: Low Risk  (06/10/2021)   Alcohol Screen    Last Alcohol Screening Score (AUDIT): 0  Depression (PHQ2-9): Low Risk  (09/08/2021)   Depression (PHQ2-9)    PHQ-2 Score: 0  Financial Resource Strain: Low Risk  (06/10/2021)   Overall Financial Resource Strain (CARDIA)    Difficulty of Paying Living Expenses: Not very hard  Food Insecurity: No Food Insecurity (06/10/2021)   Hunger Vital Sign    Worried About Running Out of Food in the Last Year: Never true    Ran Out of Food in the Last Year: Never true  Housing: Low Risk  (06/10/2021)   Housing    Last Housing Risk Score: 0  Physical Activity: Insufficiently Active (06/10/2021)   Exercise Vital Sign    Days of Exercise per Week: 3 days    Minutes of Exercise per Session: 30 min  Social Connections: Moderately Isolated (06/10/2021)   Social Connection and Isolation  Panel [NHANES]    Frequency of Communication with Friends and Family: More than three times a week    Frequency of Social Gatherings with Friends and Family: Three times a week    Attends Religious Services: Never    Active Member of Clubs or Organizations: No    Attends Archivist Meetings:  Never    Marital Status: Married  Stress: No Stress Concern Present (06/10/2021)   [place]    Feeling of Stress : Only a little  Tobacco Use: Low Risk  (10/13/2021)   Patient History    Smoking Tobacco Use: Never    Smokeless Tobacco Use: Never    Passive Exposure: Not on file  Transportation Needs: No Transportation Needs (06/10/2021)   PRAPARE - Transportation    Lack of Transportation (Medical): No    Lack of Transportation (Non-Medical): No    CCM Care Plan  Allergies  Allergen Reactions   Coreg [Carvedilol] Other (See Comments)    hypotension   Flomax [Tamsulosin Hcl] Other (See Comments)    Hypotension    Medications Reviewed Today     Reviewed by Marylu Lund., NP (Nurse Practitioner) on 10/13/21 at 1001  Med List Status: <None>   Medication Order Taking? Sig Documenting Provider Last Dose Status Informant  B Complex-Folic Acid (SUPER B COMPLEX MAXI) TABS [id number] Yes Take 1 tablet by mouth daily. [provider] Taking Active   bisoprolol (ZEBETA) 5 MG tablet [id number] Yes Take 1 tablet (5 mg) by mouth once daily [name], MD Taking Active   digoxin (LANOXIN) 0.125 MG tablet [id number] Yes Take 1 tablet (0.125 mg total) by mouth daily. [name], MD Taking Active   empagliflozin (JARDIANCE) 10 MG TABS tablet [id number] Yes Take 1 tablet (10 mg total) by mouth daily. [name], MD Taking Active   ezetimibe (ZETIA) 10 MG tablet [id number] Yes TAKE ONE TABLET BY MOUTH EVERY DAY [name], MD Taking Active   finasteride (PROSCAR) 5 MG tablet [id number] Yes Take 1 tablet (5 mg  total) by mouth daily. [name], MD Taking Active   furosemide (LASIX) 20 MG tablet [id number] Yes Take 1 tablet (20 mg) by mouth EVERY OTHER day [name], MD Taking Active   gabapentin (NEURONTIN) 300 MG capsule [id number] Yes Take 300 mg by mouth 2 (two) times daily. [provider] Taking Active   hydrochlorothiazide (MICROZIDE) 12.5 MG capsule [id number] Yes Take 1 tablet (12.5 mg) by mouth once daily in the morning [name], MD Taking Active   HYDROcodone-acetaminophen (NORCO/VICODIN) 5-325 MG tablet [id number] No Take 1 tablet by mouth every 6 (six) hours as needed for moderate pain.  Patient not taking: Reported on 10/13/2021   [name], MD Not Taking Active   lidocaine (LIDODERM) 5 % [id number] No Place 2 patches onto the skin daily. Remove & Discard patch within 12 hours or as directed by MD  Patient not taking: Reported on 10/13/2021   [name], MD Not Taking Active   lisinopril (ZESTRIL) 2.5 MG tablet [id number] Yes Take 1 tablet (2.5 mg total) by mouth daily. Theora Gianotti, NP Taking Active   loratadine (CLARITIN) 10 MG tablet [id number] Yes TAKE ONE TABLET BY MOUTH DAILY AS NEEDED FOR ALLERGIES [name], MD Taking Active   multivitamin (ONE-A-DAY MEN'S) TABS tablet [id number] Yes Take 1 tablet by mouth daily. [provider] Taking Active   nitroGLYCERIN (NITROSTAT) 0.4 MG SL tablet [id number] Yes Place 1 tablet (0.4 mg total) under the tongue every 5 (five) minutes as needed for chest pain. Rise Mu, PA-C Taking Active            Med Note Ronaldo Miyamoto   Tue Jun 08, 2021  9:13 AM)    omeprazole (PRILOSEC) 40 MG capsule  [id number] Yes Take 1 capsule (40 mg total) by mouth daily before breakfast.  Patient taking differently: Take 40 mg by mouth daily as needed.   [name], MD Taking Active   QUEtiapine (SEROQUEL) 25 MG tablet [id number] Yes Take 1 tablet (25 mg total) by mouth at bedtime. [name], MD Taking  Active   rivaroxaban (XARELTO) 20 MG TABS tablet [id number] Yes TAKE ONE TABLET BY MOUTH DAILY WITH SUPPER [name], MD Taking Active   rosuvastatin (CRESTOR) 40 MG tablet [id number] Yes Take 1 tablet (40 mg total) by mouth daily. [name], MD Taking Active   tizanidine (ZANAFLEX) 2 MG capsule [id number] No Take 1 capsule (2 mg total) by mouth 3 (three) times daily.  Patient not taking: Reported on 10/13/2021   [name], MD Not Taking Active   triamcinolone ointment (KENALOG) 0.1 % [id number] Yes Apply 1 application topically daily as needed. [provider] Taking Active   vitamin C (ASCORBIC ACID) 500 MG tablet [id number] Yes Take 500 mg by mouth daily. [provider] Taking Active             Patient Active Problem List   Diagnosis Date Noted   Mild cognitive impairment 06/07/2021   Permanent atrial fibrillation ([place]) 12/31/2020   Senile purpura ([place]) 01/31/2020   Primary insomnia 01/31/2020   Hearing loss, sensorineural 09/12/2017   Prediabetes 07/01/2017   Hx of colonic polyps 06/19/2016   Long-term use of high-risk medication 06/19/2016   Cardiomyopathy ([place]) 07/14/2015   Low back pain due to displacement of intervertebral disc 04/09/2015   Diarrhea following gastrointestinal surgery 09/04/2013   S/P laparoscopic cholecystectomy 08/20/2013   Chronic atrial fibrillation ([place]) 07/15/2013   Morbid obesity ([place]) 02/28/2013   OSA on CPAP 01/03/2013   Hyperlipidemia with target low density lipoprotein (LDL) cholesterol less than 70 mg/dL 03/22/2012   ED (erectile dysfunction) of organic origin 01/23/2012   Benign localized prostatic hyperplasia with lower urinary tract symptoms (LUTS) 01/23/2012   Bladder outlet obstruction 12/25/2011   Essential tremor 12/22/2011   Hypertension    Elevated PSA, less than 10 ng/ml 09/20/2011   Automatic implantable cardioverter-defibrillator Medtronic dual-chamber 31/59/4585   Chronic systolic heart failure  ([place]) 08/25/2011   IVCD (intraventricular conduction defect)    Coronary artery disease 08/15/2011    Immunization History  Administered Date(s) Administered   Fluad Quad(high Dose 65+) 01/25/2019, 01/31/2020, 02/08/2021   Influenza Split 02/19/2012   Influenza-Unspecified 02/08/2013, 02/09/2016, 02/20/2017, 02/02/2018   PFIZER(Purple Top)SARS-COV-2 Vaccination 06/03/2019, 06/24/2019, 02/14/2020, 08/07/2020, 02/25/2021   Pneumococcal Conjugate-13 05/14/2015   Pneumococcal Polysaccharide-23 03/21/2012   Tdap 02/28/2011, 03/28/2011   Zoster Recombinat (Shingrix) 10/29/2019, 12/31/2019    Conditions to be addressed/monitored:  Hypertension, Hyperlipidemia, Atrial Fibrillation, Heart Failure, Coronary Artery Disease, BPH, and Insomnia  There are no care plans that you recently modified to display for this patient.     Medication Assistance: None required.  Patient affirms current coverage meets needs.  Compliance/Adherence/Medication fill history: Care Gaps: None noted  Star-Rating Drugs: Rosuvastatin 40 mg last filled on 02/08/2021 for a 90-Day supply with [facility] Jardiance 10 mg last filled on 01/22/2021 for a 90-Day supply with [facility] Lisinopril 2.5 mg last filled on 12/31/2020 for a 90-Day supply with [facility]  Patient's preferred pharmacy is:  [place], [place] - [place] [address] Phone: [phone] Fax: [place] [phone] - [place], [place] - [place] [address]  [place] [address] Phone: [phone] Fax: [phone]   Uses pill box? Yes Pt endorses 100% compliance  We discussed: Current pharmacy is preferred with insurance plan and patient is satisfied with pharmacy services Patient decided to: Continue current medication management strategy  Care Plan and Follow Up Patient Decision:  Patient agrees to Care Plan and  Follow-up.  Plan: Telephone follow up appointment with care management team member scheduled for:  07/14/2021 at 11:00 AM  Malva Limes, [facility] [phone]  Current Barriers:  Unable to independently afford treatment regimen  Pharmacist Clinical Goal(s):  Patient will verbalize ability to afford treatment regimen through collaboration with PharmD and provider.   Interventions: 1:1 collaboration with [name], MD regarding development and update of comprehensive plan of care as evidenced by provider attestation and co-signature Inter-disciplinary care team collaboration (see longitudinal plan of care) Comprehensive medication review performed; medication list updated in electronic medical record  Heart Failure (Goal: manage symptoms and prevent exacerbations) -Controlled -Last ejection fraction: 30-35% (Date: 08/2020) -HF type: Systolic -NYHA Class: II (slight limitation of activity) -AHA HF Stage: C (Heart disease and symptoms present) -Current treatment: Bisoprolol 5 mg twice daily  Furosemide 20 mg 2 tablets every other day (does skip sometimes) Jardiance 10 mg daily  Lisinopril 2.5 mg daily  -Medications previously tried: Ship broker (Cost), Spironolactone   -Current home BP/HR readings:   *** -Shortness of breath, mostly when active in the yard -Recommended to continue current medication  Atrial Fibrillation (Goal: prevent stroke and major bleeding) -Controlled -Current treatment: Rate control: Bisoprolol 5 mg twice daily  Anticoagulation: Xarelto 20 mg daily   -Medications previously tried: NA -Recommended to continue current medication  Hyperlipidemia: (LDL goal < 70) -Uncontrolled -Current treatment: Ezetimibe 10 mg daily  Rosuvastatin 40 mg daily  -Medications previously tried: NA  -Current exercise habits: Yard Work. Previously doing water aerobics  -Recommended to continue current  medication  BPH (Goal: Improve symptoms ) -Controlled -Current treatment  Finasteride 5 mg daily  -Medications previously tried: NA -Endorses polyuria   -Recommended to continue current medication  Insomnia (Goal: Improve sleep quality ) -Controlled -Current treatment  Quetiapine 25 mg nightly  -Medications previously tried: NA -Recommended to continue current medication  Patient Goals/Self-Care Activities Patient will:  - check blood pressure weekly, document, and provide at future appointments weigh daily, and contact provider if weight gain of greater than 2 pounds in 24 hours  Follow Up Plan: ***

## 2021-11-03 ENCOUNTER — Telehealth: Payer: Self-pay

## 2021-11-03 ENCOUNTER — Telehealth: Payer: PPO

## 2021-11-03 NOTE — Telephone Encounter (Signed)
Can we get this patient rescheduled please.

## 2021-11-03 NOTE — Telephone Encounter (Signed)
Judeen Hammans pt's wife calling to rescheedule appt for today with Cristie Hem, [facility] d/t pt being home alone and confused of what appt was for. She will call back and schedule when she will be there d/t she is having to help out at work and unsure when would be a good time at this moment. Advised her I will let practice know and send message to [facility].

## 2021-11-04 ENCOUNTER — Other Ambulatory Visit: Payer: Self-pay | Admitting: Family Medicine

## 2021-11-04 DIAGNOSIS — I2510 Atherosclerotic heart disease of native coronary artery without angina pectoris: Secondary | ICD-10-CM

## 2021-11-04 DIAGNOSIS — E785 Hyperlipidemia, unspecified: Secondary | ICD-10-CM

## 2021-11-04 NOTE — Telephone Encounter (Signed)
Requested Prescriptions  Pending Prescriptions Disp Refills  . ezetimibe (ZETIA) 10 MG tablet [Pharmacy Med Name: EZETIMIBE 10 MG TAB] 90 tablet 1    Sig: TAKE ONE TABLET BY MOUTH EVERY DAY     Cardiovascular:  Antilipid - Sterol Transport Inhibitors Failed - 11/04/2021  4:54 PM      Failed - Lipid Panel in normal range within the last 12 months    Cholesterol, Total  Date Value Ref Range Status  01/08/2021 153 100 - 199 mg/dL Final   Cholesterol  Date Value Ref Range Status  07/09/2013 118 0 - 200 mg/dL Final   Ldl Cholesterol, Calc  Date Value Ref Range Status  07/09/2013 76 0 - 100 mg/dL Final   LDL Cholesterol (Calc)  Date Value Ref Range Status  07/31/2019 80 mg/dL (calc) Final    Comment:    Reference range: <100 . Desirable range <100 mg/dL for primary prevention;   <70 mg/dL for patients with CHD or diabetic patients  with > or = 2 CHD risk factors. Marland Kitchen LDL-C is now calculated using the Martin-Hopkins  calculation, which is a validated novel method providing  better accuracy than the Friedewald equation in the  estimation of LDL-C.  Cresenciano Genre et al. Annamaria Helling. 2423;536(14): 2061-2068  (http://education.[employer].com/faq/FAQ164)    LDL Chol Calc (NIH)  Date Value Ref Range Status  01/08/2021 91 0 - 99 mg/dL Final   Direct LDL  Date Value Ref Range Status  03/21/2012 143.6 mg/dL Final    Comment:    Optimal:  <100 mg/dLNear or Above Optimal:  100-129 mg/dLBorderline High:  130-159 mg/dLHigh:  160-189 mg/dLVery High:  >190 mg/dL   HDL Cholesterol  Date Value Ref Range Status  07/09/2013 31 (L) 40 - 60 mg/dL Final   HDL  Date Value Ref Range Status  01/08/2021 45 >39 mg/dL Final   Triglycerides  Date Value Ref Range Status  01/08/2021 93 0 - 149 mg/dL Final  07/09/2013 54 0 - 200 mg/dL Final         Passed - AST in normal range and within 360 days    AST  Date Value Ref Range Status  06/07/2021 32 10 - 35 U/L Final   SGOT(AST)  Date Value Ref  Range Status  08/29/2011 31 15 - 37 Unit/L Final         Passed - ALT in normal range and within 360 days    ALT  Date Value Ref Range Status  06/07/2021 28 9 - 46 U/L Final   SGPT (ALT)  Date Value Ref Range Status  08/29/2011 35 U/L Final    Comment:    12-78 NOTE: NEW REFERENCE RANGE 04/01/2011          Passed - Patient is not pregnant      Passed - Valid encounter within last 12 months    Recent Outpatient Visits          1 month ago Acute right-sided low back pain without sciatica   [facility] [name], MD   2 months ago Simple chronic bronchitis [facility])   [facility] [name], MD   5 months ago Tremor   [facility] Mecum, Erin E, PA-C   8 months ago Simple chronic bronchitis [facility])   [facility] [name], MD   1 year ago OSA on CPAP   [facility] [name], MD      Future Appointments  In 3 months Sowles, Drue Stager, MD [facility] [place]   In 7 months  [place]

## 2021-11-05 DIAGNOSIS — D2272 Melanocytic nevi of left lower limb, including hip: Secondary | ICD-10-CM | POA: Diagnosis not present

## 2021-11-05 DIAGNOSIS — D2261 Melanocytic nevi of right upper limb, including shoulder: Secondary | ICD-10-CM | POA: Diagnosis not present

## 2021-11-05 DIAGNOSIS — D2262 Melanocytic nevi of left upper limb, including shoulder: Secondary | ICD-10-CM | POA: Diagnosis not present

## 2021-11-05 DIAGNOSIS — L570 Actinic keratosis: Secondary | ICD-10-CM | POA: Diagnosis not present

## 2021-11-05 DIAGNOSIS — Z85828 Personal history of other malignant neoplasm of skin: Secondary | ICD-10-CM | POA: Diagnosis not present

## 2021-11-05 DIAGNOSIS — X32XXXA Exposure to sunlight, initial encounter: Secondary | ICD-10-CM | POA: Diagnosis not present

## 2021-11-30 ENCOUNTER — Ambulatory Visit (INDEPENDENT_AMBULATORY_CARE_PROVIDER_SITE_OTHER): Payer: PPO

## 2021-11-30 DIAGNOSIS — I428 Other cardiomyopathies: Secondary | ICD-10-CM

## 2021-11-30 LAB — CUP PACEART REMOTE DEVICE CHECK
Battery Remaining Longevity: 70 mo
Battery Voltage: 2.99 V
Brady Statistic AP VP Percent: 0 %
Brady Statistic AP VS Percent: 0 %
Brady Statistic AS VP Percent: 4.89 %
Brady Statistic AS VS Percent: 95.11 %
Brady Statistic RA Percent Paced: 0 %
Brady Statistic RV Percent Paced: 8.36 %
Date Time Interrogation Session: 20230725012304
HighPow Impedance: 76 Ohm
Implantable Lead Implant Date: 20130502
Implantable Lead Implant Date: 20130502
Implantable Lead Location: 753859
Implantable Lead Location: 753860
Implantable Lead Model: 5076
Implantable Lead Model: 6935
Implantable Pulse Generator Implant Date: 20181031
Lead Channel Impedance Value: 399 Ohm
Lead Channel Impedance Value: 456 Ohm
Lead Channel Impedance Value: 570 Ohm
Lead Channel Pacing Threshold Amplitude: 0.75 V
Lead Channel Pacing Threshold Pulse Width: 0.4 ms
Lead Channel Sensing Intrinsic Amplitude: 6.625 mV
Lead Channel Sensing Intrinsic Amplitude: 6.625 mV
Lead Channel Setting Pacing Amplitude: 2.5 V
Lead Channel Setting Pacing Pulse Width: 0.4 ms
Lead Channel Setting Sensing Sensitivity: 0.3 mV

## 2021-12-07 ENCOUNTER — Telehealth: Payer: Self-pay | Admitting: Internal Medicine

## 2021-12-07 NOTE — Telephone Encounter (Signed)
Patient dropped off Disability Parking Placard paper work to be completed Placed in Advertising copywriter

## 2021-12-07 NOTE — Telephone Encounter (Signed)
Form obtained from nurse bin. I attempted to call the patient and let him know that Dr. Caryl Comes is out of the office this week and next week.  No answer- I left a detailed message (ok per DPR) that MD is out of the office and it will be 12/21/21 before I can have this signed. However, I will call him back once this is completed. I asked that he call back sooner if needed.

## 2021-12-13 ENCOUNTER — Other Ambulatory Visit: Payer: Self-pay | Admitting: Family Medicine

## 2021-12-13 DIAGNOSIS — F5101 Primary insomnia: Secondary | ICD-10-CM

## 2021-12-21 ENCOUNTER — Ambulatory Visit (INDEPENDENT_AMBULATORY_CARE_PROVIDER_SITE_OTHER): Payer: PPO | Admitting: Family Medicine

## 2021-12-21 ENCOUNTER — Encounter: Payer: Self-pay | Admitting: Family Medicine

## 2021-12-21 VITALS — BP 124/78 | HR 92 | Temp 97.7°F | Resp 20 | Ht 66.0 in | Wt 202.6 lb

## 2021-12-21 DIAGNOSIS — Z9889 Other specified postprocedural states: Secondary | ICD-10-CM | POA: Diagnosis not present

## 2021-12-21 DIAGNOSIS — M5441 Lumbago with sciatica, right side: Secondary | ICD-10-CM

## 2021-12-21 DIAGNOSIS — G8929 Other chronic pain: Secondary | ICD-10-CM

## 2021-12-21 MED ORDER — LIDOCAINE HCL (PF) 1 % IJ SOLN
2.0000 mL | Freq: Once | INTRAMUSCULAR | Status: AC
  Administered 2021-12-21: 2 mL via INTRADERMAL

## 2021-12-21 MED ORDER — TRIAMCINOLONE ACETONIDE 40 MG/ML IJ SUSP
40.0000 mg | Freq: Once | INTRAMUSCULAR | Status: AC
  Administered 2021-12-21: 40 mg via INTRAMUSCULAR

## 2021-12-21 NOTE — Progress Notes (Signed)
Name: Chris Price   MRN: [id number]    DOB: [birth year]   Date:12/21/2021       Progress Note  Subjective  Chief Complaint  Back Pain  HPI  Acute on chronic back pain: he is having another flare of right lower back pain, this episodes  started 4 days ago with intense pain and swelling, yesterday unable to stand up on his own and had to use a walker to ambulate. He feels weak on his right lower extremity. He denies bowel or bladder incontinence. He has a history of DDD and also laminectomy many years ago done by Dr. Patrice Paradise   He has been under the care of chiropractor  He does not recall any recent change in activity or trauma.   Pain is intense and worse with movement   Pain has been present daily for months but intense early May and now. During his flare in May symptoms improved with a trigger point injection and lidoderm patch, plus tizanidine  Patient Active Problem List   Diagnosis Date Noted   Mild cognitive impairment 06/07/2021   Permanent atrial fibrillation ([place]) 12/31/2020   Senile purpura ([place]) 01/31/2020   Primary insomnia 01/31/2020   Hearing loss, sensorineural 09/12/2017   Prediabetes 07/01/2017   Hx of colonic polyps 06/19/2016   Long-term use of high-risk medication 06/19/2016   Cardiomyopathy ([place]) 07/14/2015   Low back pain due to displacement of intervertebral disc 04/09/2015   Diarrhea following gastrointestinal surgery 09/04/2013   S/P laparoscopic cholecystectomy 08/20/2013   Chronic atrial fibrillation ([place]) 07/15/2013   Morbid obesity ([place]) 02/28/2013   OSA on CPAP 01/03/2013   Hyperlipidemia with target low density lipoprotein (LDL) cholesterol less than 70 mg/dL 03/22/2012   ED (erectile dysfunction) of organic origin 01/23/2012   Benign localized prostatic hyperplasia with lower urinary tract symptoms (LUTS) 01/23/2012   Bladder outlet obstruction 12/25/2011   Essential tremor 12/22/2011   Hypertension    Elevated PSA, less than 10 ng/ml  09/20/2011   Automatic implantable cardioverter-defibrillator Medtronic dual-chamber 81/19/1478   Chronic systolic heart failure ([place]) 08/25/2011   IVCD (intraventricular conduction defect)    Coronary artery disease 08/15/2011    Past Surgical History:  Procedure Laterality Date   C[facility]    CARDIAC [facility]IZATION  07/09/2013   [facility]   [facility]ARD[facility]ERSION  08/2011   [facility]   CHOLECYSTECTOMY  April 2015   COLONOSCOPY WITH PROPOFOL N/A 07/30/2015   Procedure: COLONOSCOPY WITH PROPOFOL;  Surgeon: Lollie Sails, MD;  Location: [facility] ENDOSCOPY;  Service: Endoscopy;  Laterality: N/A;   cooled thermotherapy  2956   [place], complicated by incontinence   HERNIA REPAIR     ICD  09/08/2011   ICD GENERATOR CHANGEOUT N/A 03/08/2017   Procedure: ICD GENERATOR CHANGEOUT;  Surgeon: [name], MD;  Location: [facility] CV LAB;  Service: Cardiovascular;  Laterality: N/A;   IMPLANTABLE CARDIOVERTER DEFIBRILLATOR IMPLANT  09/08/2011   Procedure: IMPLANTABLE CARDIOVERTER DEFIBRILLATOR IMPLANT;  Surgeon: [name], MD;  Location: [facility] CATH LAB;  Service: Cardiovascular;;   LUMBAR LAMINECTOMY     L2 through S1 with wide decompression of the thecal sac and nerve roots.   PROSTATE BIOPSY  2013    Family History  Problem Relation Age of Onset   Heart attack Father    Heart attack Brother    Heart attack Son    Ovarian cancer Daughter     Social History   Tobacco Use   Smoking status:  Never   Smokeless tobacco: Never  Substance Use Topics   Alcohol use: No     Current Outpatient Medications:    B Complex-Folic Acid (SUPER B COMPLEX MAXI) TABS, Take 1 tablet by mouth daily., Disp: , Rfl:    bisoprolol (ZEBETA) 5 MG tablet, Take 1 tablet (5 mg) by mouth once daily, Disp: 90 tablet, Rfl: 1   digoxin (LANOXIN) 0.125 MG tablet, Take 1 tablet (0.125 mg total) by mouth daily., Disp: 90 tablet, Rfl: 3   empagliflozin (JARDIANCE) 10 MG TABS tablet, Take 1 tablet (10 mg  total) by mouth daily., Disp: 90 tablet, Rfl: 3   ezetimibe (ZETIA) 10 MG tablet, TAKE ONE TABLET BY MOUTH EVERY DAY, Disp: 90 tablet, Rfl: 0   finasteride (PROSCAR) 5 MG tablet, Take 1 tablet (5 mg total) by mouth daily., Disp: 90 tablet, Rfl: 3   furosemide (LASIX) 20 MG tablet, Take 1 tablet (20 mg) by mouth EVERY OTHER day, Disp: 90 tablet, Rfl: 1   gabapentin (NEURONTIN) 400 MG capsule, Take 400 mg by mouth 2 (two) times daily., Disp: , Rfl:    hydrochlorothiazide (MICROZIDE) 12.5 MG capsule, Take 1 tablet (12.5 mg) by mouth once daily in the morning, Disp: 90 capsule, Rfl: 1   HYDROcodone-acetaminophen (NORCO/VICODIN) 5-325 MG tablet, Take 1 tablet by mouth every 6 (six) hours as needed for moderate pain., Disp: 10 tablet, Rfl: 0   lidocaine (LIDODERM) 5 %, Place 2 patches onto the skin daily. Remove & Discard patch within 12 hours or as directed by MD, Disp: 60 patch, Rfl: 0   lisinopril (ZESTRIL) 2.5 MG tablet, Take 1 tablet (2.5 mg total) by mouth daily., Disp: 90 tablet, Rfl: 3   loratadine (CLARITIN) 10 MG tablet, TAKE ONE TABLET BY MOUTH DAILY AS NEEDED FOR ALLERGIES, Disp: 90 tablet, Rfl: 1   multivitamin (ONE-A-DAY MEN'S) TABS tablet, Take 1 tablet by mouth daily., Disp: , Rfl:    nitroGLYCERIN (NITROSTAT) 0.4 MG SL tablet, Place 1 tablet (0.4 mg total) under the tongue every 5 (five) minutes as needed for chest pain., Disp: 25 tablet, Rfl: 1   omeprazole (PRILOSEC) 40 MG capsule, Take 1 capsule (40 mg total) by mouth daily before breakfast. (Patient taking differently: Take 40 mg by mouth daily as needed.), Disp: 90 capsule, Rfl: 1   QUEtiapine (SEROQUEL) 25 MG tablet, TAKE 1 TABLET BY MOUTH AT BEDTIME, Disp: 90 tablet, Rfl: 1   rivaroxaban (XARELTO) 20 MG TABS tablet, TAKE ONE TABLET BY MOUTH DAILY WITH SUPPER, Disp: 90 tablet, Rfl: 1   rosuvastatin (CRESTOR) 40 MG tablet, Take 1 tablet (40 mg total) by mouth daily., Disp: 90 tablet, Rfl: 1   triamcinolone ointment (KENALOG) 0.1 %,  Apply 1 application topically daily as needed., Disp: , Rfl:    vitamin C (ASCORBIC ACID) 500 MG tablet, Take 500 mg by mouth daily., Disp: , Rfl:    tizanidine (ZANAFLEX) 2 MG capsule, Take 1 capsule (2 mg total) by mouth 3 (three) times daily. (Patient not taking: Reported on 12/21/2021), Disp: 30 capsule, Rfl: 0  Allergies  Allergen Reactions   Coreg [Carvedilol] Other (See Comments)    hypotension   Flomax [Tamsulosin Hcl] Other (See Comments)    Hypotension    I personally reviewed active problem list, medication list, allergies, family history, social history, health maintenance with the patient/caregiver today.   ROS  Ten systems reviewed and is negative except as mentioned in HPI   Objective  Vitals:   12/21/21 1423  BP:  124/78  Pulse: 92  Resp: 20  Temp: 97.7 F (36.5 C)  TempSrc: Oral  SpO2: 97%  Weight: 202 lb 9.6 oz (91.9 kg)  Height: '5\' 6"'$  (1.676 m)    Body mass index is 32.7 kg/m.  Physical Exam  Constitutional: Patient appears well-developed and well-nourished. Obese  Mild distress due to pain, cognitive function slightly decrease - likely also due to pain  HEENT: head atraumatic, normocephalic, pupils equal and reactive to ligth, neck supple, throat within normal limits Cardiovascular: Normal rate, irregular rhythm and normal heart sounds.  No murmur heard. No BLE edema. Pulmonary/Chest: Effort normal and breath sounds normal. No respiratory distress. Abdominal: Soft.  There is no tenderness. Muscular skeletal: swelling over right lumbar area, over SI joint. No redness or increase in warmth, pain on back with extension of legs, difficulty walking without assistance, also pain getting up from chair  Psychiatric: Patient has a normal mood and affect. behavior is normal. Judgment and thought content normal.   Recent Results (from the past 2160 hour(s))  Basic metabolic panel     Status: None   Collection Time: 10/13/21 10:11 AM  Result Value Ref Range    Sodium 139 135 - 145 mmol/L   Potassium 3.7 3.5 - 5.1 mmol/L   Chloride 106 98 - 111 mmol/L   CO2 26 22 - 32 mmol/L   Glucose, Bld 80 70 - 99 mg/dL    Comment: Glucose reference range applies only to samples taken after fasting for at least 8 hours.   BUN 15 8 - 23 mg/dL   Creatinine, Ser 0.88 0.61 - 1.24 mg/dL   Calcium 9.3 8.9 - 10.3 mg/dL   GFR, Estimated >60 >60 mL/min    Comment: (NOTE) Calculated using the CKD-EPI Creatinine Equation (2021)    Anion gap 7 5 - 15    Comment: Performed at [facility], [address] [place], [place] [address]  CUP PACEART REMOTE DEVICE CHECK     Status: None   Collection Time: 11/30/21  1:23 AM  Result Value Ref Range   Date Time Interrogation Session [phone]    Pulse Generator Manufacturer MERM    Pulse Gen Model [id number] Evera MRI XT DR    Pulse Gen Serial Number [id number]    Clinic Name [facility]    Implantable Pulse Generator Type Implantable Cardiac Defibulator    Implantable Pulse Generator Implant Date [id number]    Implantable Lead Manufacturer MERM    Implantable Lead Model 5076 CapSureFix Novus    Implantable Lead Serial Number [id number]    Implantable Lead Implant Date [id number]    Implantable Lead Location Detail 1 APPENDAGE    Implantable Lead Location [id number]    Implantable Lead Manufacturer [facility]    Implantable Lead Model 6935 Sprint Quattro Secure S    Implantable Lead Serial Number [id number]    Implantable Lead Implant Date [id number]    Implantable Lead Location Detail 1 APEX    Implantable Lead Location [id number]    Lead Channel Setting Sensing Sensitivity 0.3 mV   Lead Channel Setting Pacing Pulse Width 0.4 ms   Lead Channel Setting Pacing Amplitude 2.5 V   Lead Channel Impedance Value 456 ohm   Lead Channel Impedance Value 570 ohm   Lead Channel Impedance Value 399 ohm   Lead Channel Sensing Intrinsic Amplitude 6.625 mV   Lead Channel Sensing Intrinsic Amplitude 6.625 mV   Lead Channel Pacing  Threshold Amplitude 0.75 V   Lead Channel Pacing Threshold Pulse  Width 0.4 ms   HighPow Impedance 76 ohm   Battery Status OK    Battery Remaining Longevity 70 mo   Battery Voltage 2.99 V   Brady Statistic RA Percent Paced 0 %   Brady Statistic RV Percent Paced 8.36 %   Brady Statistic AP VP Percent 0 %   Brady Statistic AS VP Percent 4.89 %   Brady Statistic AP VS Percent 0 %   Brady Statistic AS VS Percent 95.11 %    PHQ2/9:    12/21/2021    2:26 PM 09/08/2021    3:28 PM 08/09/2021   10:36 AM 06/10/2021    8:28 AM 06/07/2021    8:54 AM  Depression screen PHQ 2/9  Decreased Interest 0 0 0 0 0  Down, Depressed, Hopeless 0 0 0 0 0  PHQ - 2 Score 0 0 0 0 0  Altered sleeping 0 0 0 0 0  Tired, decreased energy 0 0 0 0 0  Change in appetite 0 0 0 0 0  Feeling bad or failure about yourself  0 0 0 0 0  Trouble concentrating 0 0 0 0 0  Moving slowly or fidgety/restless 0 0 0 0 0  Suicidal thoughts 0 0 0 0 0  PHQ-9 Score 0 0 0 0 0  Difficult doing work/chores    Not difficult at all Not difficult at all    phq 9 is negative   Fall Risk:    12/21/2021    2:25 PM 09/08/2021    3:28 PM 08/09/2021   10:36 AM 06/10/2021    8:30 AM 06/07/2021    8:54 AM  Fall Risk   Falls in the past year? 0 0 0 0 0  Number falls in past yr: 0  0 0 0  Injury with Fall? 0  0 0 0  Risk for fall due to : Impaired balance/gait;Impaired mobility;Orthopedic patient  No Fall Risks No Fall Risks No Fall Risks  Follow up Education provided;Falls evaluation completed;Falls prevention discussed Falls evaluation completed Falls prevention discussed Falls prevention discussed Falls prevention discussed      Assessment & Plan  1. Acute right-sided low back pain without sciatica  - Ambulatory referral to Neurosurgery - MR Lumbar Spine Wo Contrast; Future    Consent form signed Localized muscle group Injection with lidocaine 1% and Kenalog '40mg'$ /1 ml on muscle  Patient tolerated procedure well No side effects

## 2021-12-23 NOTE — Telephone Encounter (Signed)
Dr. Caryl Comes has signed and completed the patient's disability parking placard form.  Attempted to call the patient to notify him this is ready for pick up. No answer- I left a detailed message his form is ready and he can pick this up at our front desk any time.

## 2021-12-28 ENCOUNTER — Other Ambulatory Visit: Payer: Self-pay | Admitting: Nurse Practitioner

## 2021-12-28 NOTE — Progress Notes (Signed)
Remote ICD transmission.   

## 2022-01-06 ENCOUNTER — Ambulatory Visit: Payer: PPO | Admitting: Neurosurgery

## 2022-01-11 ENCOUNTER — Ambulatory Visit (INDEPENDENT_AMBULATORY_CARE_PROVIDER_SITE_OTHER): Payer: PPO | Admitting: Internal Medicine

## 2022-01-11 ENCOUNTER — Encounter: Payer: Self-pay | Admitting: Internal Medicine

## 2022-01-11 DIAGNOSIS — U071 COVID-19: Secondary | ICD-10-CM

## 2022-01-11 MED ORDER — BENZONATATE 100 MG PO CAPS: 100.0000 mg | ORAL_CAPSULE | Freq: Two times a day (BID) | ORAL | 0 refills | Status: DC | PRN

## 2022-01-11 MED ORDER — FLUTICASONE PROPIONATE 50 MCG/ACT NA SUSP: 2.0000 | Freq: Every day | NASAL | 6 refills | Status: DC

## 2022-01-11 NOTE — Progress Notes (Signed)
Virtual Visit via Video Note  I connected with Chris Price on 01/11/22 at  3:40 PM EDT by a video enabled telemedicine application and verified that I am speaking with the correct person using two identifiers.  Location: Patient: Home Provider: [facility]   I discussed the limitations of evaluation and management by telemedicine and the availability of in person appointments. The patient expressed understanding and agreed to proceed.  History of Present Illness:  Patient is presenting for positive COVID test today. Symptoms started on 01/06/22. Fatigue, congested.   COVID:  -Fever: no, feeling warm -Cough: yes dry -Shortness of breath: yes -Wheezing: no -Chest congestion: no -Nasal congestion: yes -Runny nose: yes -Post nasal drip: no -Sneezing: yes -Sore throat: no -Swollen glands: no -Sinus pressure: no -Headache: no -Ear pain: no  -Ear pressure: no  -Fatigue: yes -Sick contacts: yes -Context: better -Recurrent sinusitis: no -Relief with OTC cold/cough medications: yes  -Treatments attempted: Corcidin   Observations/Objective:  General: well appearing, no acute distress ENT: conjunctiva normal appearing bilaterally  Skin: no rashes, cyanosis or abnormal bruising noted Neuro: Answers all questions appropriately.  Assessment and Plan:  1. COVID-19: Tested positive for COVID today, symptoms started about 4 days ago.  Symptoms are already improving.  Patient does not qualify for antivirals due to medication interactions.  Will treat symptomatically with cough suppressant and nasal steroid.  Recommend patient obtain pulse oximeter to monitor oxygen levels at home.  Otherwise patient will rest and stay well-hydrated and follow-up if symptoms worsen or fail to improve.  - benzonatate (TESSALON) 100 MG capsule; Take 1 capsule (100 mg total) by mouth 2 (two) times daily as needed for cough.  Dispense: 20 capsule; Refill: 0 - fluticasone (FLONASE) 50 MCG/ACT nasal spray; Place  2 sprays into both nostrils daily.  Dispense: 16 g; Refill: 6   Follow Up Instructions: As needed or if symptoms worsen or fail to improve.    I discussed the assessment and treatment plan with the patient. The patient was provided an opportunity to ask questions and all were answered. The patient agreed with the plan and demonstrated an understanding of the instructions.   The patient was advised to call back or seek an in-person evaluation if the symptoms worsen or if the condition fails to improve as anticipated.  I provided 8 minutes of non-face-to-face time during this encounter.   Teodora Medici, DO

## 2022-02-01 ENCOUNTER — Encounter: Payer: PPO | Admitting: Internal Medicine

## 2022-02-03 ENCOUNTER — Encounter: Payer: Self-pay | Admitting: Internal Medicine

## 2022-02-03 ENCOUNTER — Ambulatory Visit: Payer: PPO | Attending: Internal Medicine | Admitting: Internal Medicine

## 2022-02-03 VITALS — BP 118/70 | HR 67 | Ht 66.0 in | Wt 203.1 lb

## 2022-02-03 DIAGNOSIS — I5020 Unspecified systolic (congestive) heart failure: Secondary | ICD-10-CM | POA: Diagnosis not present

## 2022-02-03 DIAGNOSIS — I428 Other cardiomyopathies: Secondary | ICD-10-CM | POA: Diagnosis not present

## 2022-02-03 DIAGNOSIS — Z95810 Presence of automatic (implantable) cardiac defibrillator: Secondary | ICD-10-CM

## 2022-02-03 DIAGNOSIS — E782 Mixed hyperlipidemia: Secondary | ICD-10-CM | POA: Diagnosis not present

## 2022-02-03 DIAGNOSIS — Z79899 Other long term (current) drug therapy: Secondary | ICD-10-CM

## 2022-02-03 DIAGNOSIS — I4821 Permanent atrial fibrillation: Secondary | ICD-10-CM | POA: Diagnosis not present

## 2022-02-03 DIAGNOSIS — I10 Essential (primary) hypertension: Secondary | ICD-10-CM

## 2022-02-03 MED ORDER — DIGOXIN 250 MCG PO TABS: 0.2500 mg | ORAL_TABLET | Freq: Every day | ORAL | 6 refills | Status: DC

## 2022-02-03 NOTE — Progress Notes (Signed)
Patient ID: Chris Price, male   DOB: 05-13-1940, 81 y.o.   MRN: [id number]  Patient Care Team: [name], MD as PCP - General (Family Medicine) [name], MD as PCP - Cardiology (Cardiology) [name], MD as Consulting Physician (Cardiology) [name], MD as Consulting Physician (Urology) [name], [facility] (Pharmacist)   HPI  Chris Price is a 81 y.o. male Seen in followup for an ICD implanted (DOI 2013/ gen change 10/18)for primary prevention in nonischemic cardiomyopathy further complicated by sinus bradycardia   Atrial fibrillation, managed previously with amiodarone but now permanent.   Anticoagulation with Rivaroxaban  no bleeding   Has had hypotension limiting rate control  and cardiomyopathy drugs ( ie aldactone/entresto)     Because of dyspnea on exertion 1/23, bisoprolol was increased, digoxin continued and Jardiance, (from [place]) was anticipated but not fulfilled     The patient denies chest pain, nocturnal dyspnea, orthopnea or peripheral edema.  There have been no palpitations, lightheadedness or syncope.  Complains of dyspnea on exertion.  Somewhat better on the Jardiance.  Biggest issue is tremor which is making it hard to eat and to write.  Mostly with intention is worsening.Marland Kitchen     DATE Test EF%   3/15 Cath  35 % Nonobstructive disease  4/22  Echo 30-35%   4/22 Myoview  35% No ischemia           Date Cr K Hgb Dig  10/18 0.95 4.9     3/20 0.96 4.5 14.8   3/21 1.03 4.3 14.9   11/21 1.0 4.2 15.6   4/22 1.01 4.1 14.4   1/23 0.87 4.7 15.9 <0.4  6/23 0.88 3.7     Thromboembolic risk factors ( age  -2, HTN-1, Vasc disease -1, CHF-1,) for a CHADSVASc Score of 5  Past Medical History:  Diagnosis Date   Chronic HFrEF (heart failure with reduced ejection fraction) ([place])    a. 08/2020 Echo: EF 30-35%, glob HK, mild LVH. Low nl RV fxn. Sev BAE. Mild MR. Mild-mod TR. Mild AI. Ao sclerosis.   Degenerative disk disease    lumbar    Disc herniation    GERD (gastroesophageal reflux disease)    Hyperlipidemia    Hypertension    ICD (implantable cardiac defibrillator) in place    a. 02/2017 gen change to MDT MRI compatible single lead device. Ser # [id number].   IVCD (intraventricular conduction defect)    Nonspecific QRS duration 122   Lumbar spinal stenosis    Neuromuscular disorder (HCC)    neauropathy   / siatica   NICM (nonischemic cardiomyopathy) ([place])    a. 07/2011 Echo: EF 25-30%-->s/p ICD; b. 02/2017 s/p ICD gen change; c. 08/2020 Echo: EF 30-35%.   Non-obstructive Coronary artery disease 08/15/2011   a. 08/2011 Cath: Moderate nonobstructive CAD; b. 07/2013 Cath: LM nl, LAD 86m D1 50/739mLCX 507mCA 30p, 50d-->Med rx; c. 08/2020 MV: EF 35%. No ischemia. Fixed apical defect.   [facility]  Sleep apnea    uses cpap    Past Surgical History:  Procedure Laterality Date   CARDIAC [facility]ATION  [facility]    CARD[facility]AC CAT[facility]IZATION  07/09/2013   [facility]   CARDIOVERSION  08/2011   [facility]   CHOLECYSTECTOMY  April 2015   COLONOSCOPY WITH PROPOFOL N/A 07/30/2015   Procedure: COLONOSCOPY WITH PROPOFOL;  Surgeon: MarLollie SailsD;  [facility];  Service: Endoscopy;  Laterality: N/A;  cooled thermotherapy  5170   [place], complicated by incontinence   HERNIA REPAIR     ICD  09/08/2011   ICD GENERATOR CHANGEOUT N/A 03/08/2017   Procedure: ICD GENERATOR CHANGEOUT;  Surgeon: [name], MD;  Location: [place] CV LAB;  Service: Cardiovascular;  Laterality: N/A;   IMPLANTABLE CARDIOVERTER DEFIBRILLATOR IMPLANT  09/08/2011   Procedure: IMPLANTABLE CARDIOVERTER DEFIBRILLATOR IMPLANT;  Surgeon: [name], MD;  Location[facility] CATH LAB;  Service: Cardiovascular;;   LUMBAR LAMINECTOMY     L2 through S1 with wide decompression of the thecal sac and nerve roots.   PROSTATE BIOPSY  2013    Current Outpatient Medications  Medication Sig Dispense Refill   B Complex-Folic  Acid (SUPER B COMPLEX MAXI) TABS Take 1 tablet by mouth daily.     benzonatate (TESSALON) 100 MG capsule Take 1 capsule (100 mg total) by mouth 2 (two) times daily as needed for cough. 20 capsule 0   bisoprolol (ZEBETA) 5 MG tablet Take 1 tablet (5 mg) by mouth once daily 90 tablet 1   digoxin (LANOXIN) 0.125 MG tablet Take 1 tablet (0.125 mg total) by mouth daily. 90 tablet 3   empagliflozin (JARDIANCE) 10 MG TABS tablet Take 1 tablet (10 mg total) by mouth daily. 90 tablet 3   ezetimibe (ZETIA) 10 MG tablet TAKE ONE TABLET BY MOUTH EVERY DAY 90 tablet 0   finasteride (PROSCAR) 5 MG tablet Take 1 tablet (5 mg total) by mouth daily. 90 tablet 3   fluticasone (FLONASE) 50 MCG/ACT nasal spray Place 2 sprays into both nostrils daily. 16 g 6   furosemide (LASIX) 20 MG tablet Take 1 tablet (20 mg) by mouth EVERY OTHER day 90 tablet 1   gabapentin (NEURONTIN) 400 MG capsule Take 400 mg by mouth 2 (two) times daily.     hydrochlorothiazide (MICROZIDE) 12.5 MG capsule Take 1 tablet (12.5 mg) by mouth once daily in the morning 90 capsule 1   HYDROcodone-acetaminophen (NORCO/VICODIN) 5-325 MG tablet Take 1 tablet by mouth every 6 (six) hours as needed for moderate pain. 10 tablet 0   lidocaine (LIDODERM) 5 % Place 2 patches onto the skin daily. Remove & Discard patch within 12 hours or as directed by MD 60 patch 0   lisinopril (ZESTRIL) 2.5 MG tablet Take 1 tablet (2.5 mg total) by mouth daily. Please keep upcoming appointment for future refills. Thank you. 90 tablet 0   loratadine (CLARITIN) 10 MG tablet TAKE ONE TABLET BY MOUTH DAILY AS NEEDED FOR ALLERGIES 90 tablet 1   multivitamin (ONE-A-DAY MEN'S) TABS tablet Take 1 tablet by mouth daily.     nitroGLYCERIN (NITROSTAT) 0.4 MG SL tablet Place 1 tablet (0.4 mg total) under the tongue every 5 (five) minutes as needed for chest pain. 25 tablet 1   omeprazole (PRILOSEC) 40 MG capsule Take 1 capsule (40 mg total) by mouth daily before breakfast. (Patient  taking differently: Take 40 mg by mouth daily as needed.) 90 capsule 1   QUEtiapine (SEROQUEL) 25 MG tablet TAKE 1 TABLET BY MOUTH AT BEDTIME 90 tablet 1   rivaroxaban (XARELTO) 20 MG TABS tablet TAKE ONE TABLET BY MOUTH DAILY WITH SUPPER 90 tablet 1   rosuvastatin (CRESTOR) 40 MG tablet Take 1 tablet (40 mg total) by mouth daily. 90 tablet 1   tizanidine (ZANAFLEX) 2 MG capsule Take 1 capsule (2 mg total) by mouth 3 (three) times daily. 30 capsule 0   triamcinolone ointment (KENALOG) 0.1 % Apply 1 application  topically daily as needed.     vitamin C (ASCORBIC ACID) 500 MG tablet Take 500 mg by mouth daily.     No current facility-administered medications for this visit.    Allergies  Allergen Reactions   Coreg [Carvedilol] Other (See Comments)    hypotension   Flomax [Tamsulosin Hcl] Other (See Comments)    Hypotension    Review of Systems negative except from HPI and PMH  Physical Exam: BP 118/70 (BP Location: Left Arm, Patient Position: Sitting, Cuff Size: Normal)   Pulse 67   Ht '5\' 6"'$  (1.676 m)   Wt 203 lb 2 oz (92.1 kg)   SpO2 97%   BMI 32.79 kg/m  Well developed and well nourished in no acute distress HENT normal Neck supple with JVP-6-8 cm clear Device pocket well healed; without hematoma or erythema.  There is no tethering  Irregularly Irregular rate and rhythm with controlled  ventricular response  gallop No murmur Abd-soft with active BS No Clubbing cyanosis  edema Skin-warm and dry A & Oriented  Grossly normal sensory and motor function  ECG atrial fibrillation at 67 Intervals-/13/44  ECG atrial fibrillation at 71 Intervals-/15/45 Right bundle branch block left anterior fascicular block  ECG 4 /22 was associated with left bundle branch block  Assessment and  Plan  NICM  Congestive heart failure-acute/chronic-systolic grade 3  Afib-permanent  Hypotension limiting guideline directed therapy  IVCD/LBBB  Tremor-worsening  ICD-MDT    Bradycardia  4% ventricular pacing  Euvolemic continue his furosemide at 20 mg every other day with hydrochlorothiazide 12.5 daily.  Reviewing the literature on medications causing tremors, lisinopril is listed as rarely, we will have him hold it for a month.  Have also reached out to Dr.r TAT for consultation to see if there is anything else she can do.  His cardiomyopathy invites further guideline directed therapy.  He is on [place] which she has tolerated reasonably, got it from [place].  Has improved dyspnea some.  Continues with dyspnea on exertion.  We will plan to consider the addition of spironolactone at next visit and he is to resume his lisinopril if after a month there is no change in his tremor.  Notably been on Xarelto.  Continue at 20 mg daily.

## 2022-02-03 NOTE — Patient Instructions (Addendum)
Medication Instructions:  - Your physician has recommended you make the following change in your medication:   1) INCREASE Digoxin to 0.25 mg - take 1 tablet by mouth once daily  2) HOLD Lisinopril x 1 month to see if tremors improve If no change in symptoms, then please resume If symptoms improve, please call and let us know  *If you need a refill on your cardiac medications before your next appointment, please call your pharmacy*   Lab Work: - Your physician recommends that you return for lab work in: 3-4 weeks   Digoxin level  [facility] at [facility] 1st desk on the right to check in (REGISTRATION)  Lab hours: Monday- Friday (7:30 am- 5:30 pm)   If you have labs (blood work) drawn today and your tests are completely normal, you will receive your results only by: MyChart Message (if you have MyChart) OR A paper copy in the mail If you have any lab test that is abnormal or we need to change your treatment, we will call you to review the results.   Testing/Procedures: - none ordered   Follow-Up: At [facility], you and your health needs are our priority.  As part of our continuing mission to provide you with exceptional heart care, we have created designated Provider Care Teams.  These Care Teams include your primary Cardiologist (physician) and Advanced Practice Providers (APPs -  Physician Assistants and Nurse Practitioners) who all work together to provide you with the care you need, when you need it.  We recommend signing up for the patient portal called "MyChart".  Sign up information is provided on this After Visit Summary.  MyChart is used to connect with patients for Virtual Visits (Telemedicine).  Patients are able to view lab/test results, encounter notes, upcoming appointments, etc.  Non-urgent messages can be sent to your provider as well.   To learn more about what you can do with MyChart, go to [email or web address].    Your next appointment:    6 month(s)  The format for your next appointment:   In Person  Provider:   Virl Axe, MD    Other Instructions   Dr. Doristine Devoid office will contact you with an appointment to follow up  Important Information About Sugar

## 2022-02-07 NOTE — Progress Notes (Signed)
Name: Chris Price   MRN: [id number]    DOB: 02-20-1941   Date:02/08/2022       Progress Note  Subjective  Chief Complaint  Follow Up  HPI  Chronic afib: he sees Dr. Jens Som, he denies chest pain,  palpitation but has SOB with activity ( like working on his yard) , he has a pacemaker , he is on beta-blocker off lisinopril to see if tremors will improve symptoms.    Chronic bronchitis: : he never smoked, second hand smoking as a child - both parents smoked - He has chronic cough that is productive and clear to white,  no wheezing, he has sob during activity but stable and likely multifactorial .  He does not want to do any inhalers  He states stable.    CHF :He has mild lower extremity edema.  He sees cardiologist, Dr. Caryl Comes.  He has been off  spironolactone back on low dose  beta-blocker, off lisinopril recently to see if symptoms of tremors improve, he is on Jardiance for CHF, he is still doing his own yard and has SOB with moderate activity.  Last Echo 08/2020 showed EF of 30-35 % global hypokinesis   CAD : last cardiac cath was 2015, on statin therapy-Crestor 40 mg and Zetia 10 mg but LDL not at goal, Discussed PCSK9 with patient today but not interested due to cost . We will recheck labs today    OSA/CPAP: he is very compliant with CPAP, he wakes up feeling refreshed in the mornings, no headaches, but wakes up with a dry mouth at times. . Unchanged    Pre-diabetes: last hgbA1C was slightly lower at 5.7 % , denies polyphagia, polydipsia or polyuria. He is on Jardicance for CHF   HTN: bp has been at goal lately, denies chest pain or palpitation.    Paresthesia both feet: going on since 2007 after back surgery - Dr. Rennis Harding, but getting progressively worse. B12 and folate are normal, he is now on gabapentin given by Dr. [name] for tremors but continues to have leg numbness. Unchanged    Insomnia he is doing well on seroquel and denies side effects of medication    Tremors hands:  going on for years, but worse since 2019 , seen at [facility] , he had a second opinion done at [facility] neurology. He is back seeing Dr. [name] had a normal MRI done 04/23, but will go see Dr. Carles Collet in [place]  He is still taking gabapentin but symptoms are progressing still    Hearing loss: he sees ENT and is now wearing a hearing aid . Unchanged   BPH with LUTS: seeing Urologist , seeing Dr. Erlene Quan yearly now and is doing well, nocturia at most once a night , sometimes does not get up at all   Senile purpura: both arms, reassurance given . Stable, recently biopsies done by dermatologist . Unchanged   Patient Active Problem List   Diagnosis Date Noted   Mild cognitive impairment 06/07/2021   Permanent atrial fibrillation ([place]) 12/31/2020   Senile purpura ([place]) 01/31/2020   Primary insomnia 01/31/2020   Hearing loss, sensorineural 09/12/2017   Prediabetes 07/01/2017   Hx of colonic polyps 06/19/2016   Long-term use of high-risk medication 06/19/2016   Cardiomyopathy ([place]) 07/14/2015   Low back pain due to displacement of intervertebral disc 04/09/2015   Diarrhea following gastrointestinal surgery 09/04/2013   S/P laparoscopic cholecystectomy 08/20/2013   Chronic atrial fibrillation ([place]) 07/15/2013   Morbid obesity ([place])  02/28/2013   OSA on CPAP 01/03/2013   Hyperlipidemia with target low density lipoprotein (LDL) cholesterol less than 70 mg/dL 03/22/2012   ED (erectile dysfunction) of organic origin 01/23/2012   Benign localized prostatic hyperplasia with lower urinary tract symptoms (LUTS) 01/23/2012   Bladder outlet obstruction 12/25/2011   Essential tremor 12/22/2011   Hypertension    Elevated PSA, less than 10 ng/ml 09/20/2011   Automatic implantable cardioverter-defibrillator Medtronic dual-chamber 62/83/1517   Chronic systolic heart failure ([place]) 08/25/2011   IVCD (intraventricular conduction defect)    Coronary artery disease 08/15/2011    Past Surgical History:   Procedure Laterality Date   CARDIAC CATHETERIZATION  08/15/2011   [facility]    CARDIAC CATHETERIZATION  07/09/2013   [facility]   CARDIOVERSION  08/2011   [facility]   CHOLECYSTECTOMY  April 2015   COLONOSCOPY WITH PROPOFOL N/A 07/30/2015   Procedure: COLONOSCOPY WITH PROPOFOL;  Surgeon: Lollie Sails, MD;  Location: [facility] ENDOSCOPY;  Service: Endoscopy;  Laterality: N/A;   cooled thermotherapy  6160   [place], complicated by incontinence   HERNIA REPAIR     ICD  09/08/2011   ICD GENERATOR CHANGEOUT N/A 03/08/2017   Procedure: ICD GENERATOR CHANGEOUT;  Surgeon: [name], MD;  Location: [facility] LAB;  Service: Cardiovascular;  Laterality: N/A;   IMPLANTABLE CARDIOVERTER DEFIBRILLATOR IMPLANT  09/08/2011   Procedure: IMPLANTABLE CARDIOVERTER DEFIBRILLATOR IMPLANT;  Surgeon: [name], MD;  Location[facility] CATH LAB;  Service: Cardiovascular;;   LUMBAR LAMINECTOMY     L2 through S1 with wide decompression of the thecal sac and nerve roots.   PROSTATE BIOPSY  2013    Family History  Problem Relation Age of Onset   Heart attack Father    Heart attack Brother    Heart attack Son    Ovarian cancer Daughter     Social History   Tobacco Use   Smoking status: Never   Smokeless tobacco: Never  Substance Use Topics   Alcohol use: No     Current Outpatient Medications:    B Complex-Folic Acid (SUPER B COMPLEX MAXI) TABS, Take 1 tablet by mouth daily., Disp: , Rfl:    benzonatate (TESSALON) 100 MG capsule, Take 1 capsule (100 mg total) by mouth 2 (two) times daily as needed for cough., Disp: 20 capsule, Rfl: 0   bisoprolol (ZEBETA) 5 MG tablet, Take 1 tablet (5 mg) by mouth once daily, Disp: 90 tablet, Rfl: 1   digoxin (LANOXIN) 0.25 MG tablet, Take 1 tablet (0.25 mg total) by mouth daily. (Patient taking differently: Take 0.25 mg by mouth 2 (two) times daily.), Disp: 30 tablet, Rfl: 6   empagliflozin (JARDIANCE) 10 MG TABS tablet, Take 1 tablet (10 mg total) by mouth daily., Disp: 90 tablet,  Rfl: 3   ezetimibe (ZETIA) 10 MG tablet, TAKE ONE TABLET BY MOUTH EVERY DAY, Disp: 90 tablet, Rfl: 0   finasteride (PROSCAR) 5 MG tablet, Take 1 tablet (5 mg total) by mouth daily., Disp: 90 tablet, Rfl: 3   fluticasone (FLONASE) 50 MCG/ACT nasal spray, Place 2 sprays into both nostrils daily., Disp: 16 g, Rfl: 6   furosemide (LASIX) 20 MG tablet, Take 1 tablet (20 mg) by mouth EVERY OTHER day, Disp: 90 tablet, Rfl: 1   gabapentin (NEURONTIN) 400 MG capsule, Take 400 mg by mouth 2 (two) times daily., Disp: , Rfl:    hydrochlorothiazide (MICROZIDE) 12.5 MG capsule, Take 1 tablet (12.5 mg) by mouth once daily in the morning, Disp: 90 capsule,  Rfl: 1   HYDROcodone-acetaminophen (NORCO/VICODIN) 5-325 MG tablet, Take 1 tablet by mouth every 6 (six) hours as needed for moderate pain., Disp: 10 tablet, Rfl: 0   lidocaine (LIDODERM) 5 %, Place 2 patches onto the skin daily. Remove & Discard patch within 12 hours or as directed by MD, Disp: 60 patch, Rfl: 0   loratadine (CLARITIN) 10 MG tablet, TAKE ONE TABLET BY MOUTH DAILY AS NEEDED FOR ALLERGIES, Disp: 90 tablet, Rfl: 1   multivitamin (ONE-A-DAY MEN'S) TABS tablet, Take 1 tablet by mouth daily., Disp: , Rfl:    nitroGLYCERIN (NITROSTAT) 0.4 MG SL tablet, Place 1 tablet (0.4 mg total) under the tongue every 5 (five) minutes as needed for chest pain., Disp: 25 tablet, Rfl: 1   omeprazole (PRILOSEC) 40 MG capsule, Take 1 capsule (40 mg total) by mouth daily before breakfast. (Patient taking differently: Take 40 mg by mouth daily as needed.), Disp: 90 capsule, Rfl: 1   QUEtiapine (SEROQUEL) 25 MG tablet, TAKE 1 TABLET BY MOUTH AT BEDTIME, Disp: 90 tablet, Rfl: 1   rivaroxaban (XARELTO) 20 MG TABS tablet, TAKE ONE TABLET BY MOUTH DAILY WITH SUPPER, Disp: 90 tablet, Rfl: 1   rosuvastatin (CRESTOR) 40 MG tablet, Take 1 tablet (40 mg total) by mouth daily., Disp: 90 tablet, Rfl: 1   tizanidine (ZANAFLEX) 2 MG capsule, Take 1 capsule (2 mg total) by mouth 3 (three)  times daily., Disp: 30 capsule, Rfl: 0   triamcinolone ointment (KENALOG) 0.1 %, Apply 1 application topically daily as needed., Disp: , Rfl:    vitamin C (ASCORBIC ACID) 500 MG tablet, Take 500 mg by mouth daily., Disp: , Rfl:    lisinopril (ZESTRIL) 2.5 MG tablet, Take 1 tablet (2.5 mg total) by mouth daily. Please keep upcoming appointment for future refills. Thank you., Disp: 90 tablet, Rfl: 0  Allergies  Allergen Reactions   Coreg [Carvedilol] Other (See Comments)    hypotension   Flomax [Tamsulosin Hcl] Other (See Comments)    Hypotension    I personally reviewed active problem list, medication list, allergies, family history, social history, health maintenance with the patient/caregiver today.   ROS  Constitutional: Negative for fever or weight change.  Respiratory: Negative for cough and shortness of breath.   Cardiovascular: Negative for chest pain or palpitations.  Gastrointestinal: Negative for abdominal pain, no bowel changes.  Musculoskeletal: Negative for gait problem or joint swelling.  Skin: Negative for rash.  Neurological: Negative for dizziness or headache. Tremors  No other specific complaints in a complete review of systems (except as listed in HPI above).   Objective  Vitals:   02/08/22 0819  BP: 128/78  Pulse: 64  Resp: 16  Temp: 97.6 F (36.4 C)  SpO2: 96%  Weight: 203 lb (92.1 kg)  Height: '5\' 6"'$  (1.676 m)    Body mass index is 32.77 kg/m.  Physical Exam  Constitutional: Patient appears well-developed and well-nourished. Obese  No distress.  HEENT: head atraumatic, normocephalic, pupils equal and reactive to light, neck supple Cardiovascular: Normal rate, irregular rhythm and normal heart sounds.  No murmur heard. Trace  BLE edema. Pulmonary/Chest: Effort normal and breath sounds normal. No respiratory distress. Abdominal: Soft.  There is no tenderness. Neurological: tremors both hands  Psychiatric: Patient has a normal mood and affect.  behavior is normal. Judgment and thought content normal.   Recent Results (from the past 2160 hour(s))  CUP PACEART REMOTE DEVICE CHECK     Status: None   Collection Time: 11/30/21  1:23 AM  Result Value Ref Range   Date Time Interrogation Session [phone]    Pulse Generator Manufacturer MERM    Pulse Gen Model [id number] Evera MRI XT DR    Pulse Gen Serial Number [id number]    Clinic Name [facility]    Implantable Pulse Generator Type Implantable Cardiac Defibulator    Implantable Pulse Generator Implant Date [id number]    Implantable Lead Manufacturer [facility]    Implantable Lead Model 5076 CapSureFix Novus    Implantable Lead Serial Number [id number]    Implantable Lead Implant Date [id number]    Implantable Lead Location Detail 1 APPENDAGE    Implantable Lead Location [id number]    Implantable Lead Manufacturer [facility]    Implantable Lead Model 6935 Sprint Quattro Secure S    Implantable Lead Serial Number [id number]    Implantable Lead Implant Date 78242353    Implantable Lead Location Detail 1 APEX    Implantable Lead Location [phone]    Lead Channel Setting Sensing Sensitivity 0.3 mV   Lead Channel Setting Pacing Pulse Width 0.4 ms   Lead Channel Setting Pacing Amplitude 2.5 V   Lead Channel Impedance Value 456 ohm   Lead Channel Impedance Value 570 ohm   Lead Channel Impedance Value 399 ohm   Lead Channel Sensing Intrinsic Amplitude 6.625 mV   Lead Channel Sensing Intrinsic Amplitude 6.625 mV   Lead Channel Pacing Threshold Amplitude 0.75 V   Lead Channel Pacing Threshold Pulse Width 0.4 ms   HighPow Impedance 76 ohm   Battery Status OK    Battery Remaining Longevity 70 mo   Battery Voltage 2.99 V   Brady Statistic RA Percent Paced 0 %   Brady Statistic RV Percent Paced 8.36 %   Brady Statistic AP VP Percent 0 %   Brady Statistic AS VP Percent 4.89 %   Brady Statistic AP VS Percent 0 %   Brady Statistic AS VS Percent 95.11 %    PHQ2/9:    02/08/2022    8:18 AM 01/11/2022     3:29 PM 12/21/2021    2:26 PM 09/08/2021    3:28 PM 08/09/2021   10:36 AM  Depression screen PHQ 2/9  Decreased Interest 0 0 0 0 0  Down, Depressed, Hopeless 0 0 0 0 0  PHQ - 2 Score 0 0 0 0 0  Altered sleeping 0 0 0 0 0  Tired, decreased energy 0 0 0 0 0  Change in appetite 0 0 0 0 0  Feeling bad or failure about yourself  0 0 0 0 0  Trouble concentrating 0 0 0 0 0  Moving slowly or fidgety/restless 0 0 0 0 0  Suicidal thoughts 0 0 0 0 0  PHQ-9 Score 0 0 0 0 0  Difficult doing work/chores  Not difficult at all       phq 9 is negative   Fall Risk:    02/08/2022    8:18 AM 01/11/2022    3:29 PM 12/21/2021    2:25 PM 09/08/2021    3:28 PM 08/09/2021   10:36 AM  Fall Risk   Falls in the past year? 1 1 0 0 0  Number falls in past yr: 0 0 0  0  Injury with Fall? 0 0 0  0  Risk for fall due to : No Fall Risks  Impaired balance/gait;Impaired mobility;Orthopedic patient  No Fall Risks  Follow up Falls prevention discussed  Education provided;Falls evaluation completed;Falls prevention discussed Falls evaluation completed  Falls prevention discussed      Functional Status Survey: Is the patient deaf or have difficulty hearing?: No Does the patient have difficulty seeing, even when wearing glasses/contacts?: No Does the patient have difficulty concentrating, remembering, or making decisions?: Yes Does the patient have difficulty walking or climbing stairs?: No Does the patient have difficulty dressing or bathing?: No Does the patient have difficulty doing errands alone such as visiting a doctor's office or shopping?: No    Assessment & Plan  1. Chronic atrial fibrillation (HCC)  Stable  2. Senile purpura (HCC)  Stable   3. Chronic systolic heart failure (HCC)  Stable  4. Other cardiomyopathy ([place])  Under the care of Dr. Caryl Comes  5. Simple chronic bronchitis ([place])   6. Coronary artery disease due to lipid rich plaque  - ezetimibe (ZETIA) 10 MG tablet; Take 1 tablet  (10 mg total) by mouth daily.  Dispense: 90 tablet; Refill: 1 - rosuvastatin (CRESTOR) 40 MG tablet; Take 1 tablet (40 mg total) by mouth daily.  Dispense: 90 tablet; Refill: 1  7. Need for immunization against influenza  - Flu Vaccine QUAD High Dose(Fluad)  8. Dyslipidemia  - ezetimibe (ZETIA) 10 MG tablet; Take 1 tablet (10 mg total) by mouth daily.  Dispense: 90 tablet; Refill: 1 - rosuvastatin (CRESTOR) 40 MG tablet; Take 1 tablet (40 mg total) by mouth daily.  Dispense: 90 tablet; Refill: 1  9. Intermittent low back pain  - tizanidine (ZANAFLEX) 2 MG capsule; Take 1 capsule (2 mg total) by mouth 3 (three) times daily.  Dispense: 30 capsule; Refill: 0  10. History of lumbar laminectomy   11. Mild cognitive impairment   12. Benign localized prostatic hyperplasia with lower urinary tract symptoms (LUTS)   13. Essential tremor   14. Primary insomnia

## 2022-02-08 ENCOUNTER — Ambulatory Visit (INDEPENDENT_AMBULATORY_CARE_PROVIDER_SITE_OTHER): Payer: PPO | Admitting: Family Medicine

## 2022-02-08 ENCOUNTER — Encounter: Payer: Self-pay | Admitting: Family Medicine

## 2022-02-08 ENCOUNTER — Encounter: Payer: Self-pay | Admitting: Neurology

## 2022-02-08 ENCOUNTER — Ambulatory Visit (HOSPITAL_COMMUNITY): Payer: PPO

## 2022-02-08 VITALS — BP 128/78 | HR 64 | Temp 97.6°F | Resp 16 | Ht 66.0 in | Wt 203.0 lb

## 2022-02-08 DIAGNOSIS — J410 Simple chronic bronchitis: Secondary | ICD-10-CM | POA: Diagnosis not present

## 2022-02-08 DIAGNOSIS — I5022 Chronic systolic (congestive) heart failure: Secondary | ICD-10-CM | POA: Diagnosis not present

## 2022-02-08 DIAGNOSIS — Z79899 Other long term (current) drug therapy: Secondary | ICD-10-CM

## 2022-02-08 DIAGNOSIS — Z9889 Other specified postprocedural states: Secondary | ICD-10-CM | POA: Diagnosis not present

## 2022-02-08 DIAGNOSIS — G250 Essential tremor: Secondary | ICD-10-CM | POA: Diagnosis not present

## 2022-02-08 DIAGNOSIS — F5101 Primary insomnia: Secondary | ICD-10-CM | POA: Diagnosis not present

## 2022-02-08 DIAGNOSIS — I428 Other cardiomyopathies: Secondary | ICD-10-CM | POA: Diagnosis not present

## 2022-02-08 DIAGNOSIS — I2583 Coronary atherosclerosis due to lipid rich plaque: Secondary | ICD-10-CM

## 2022-02-08 DIAGNOSIS — E785 Hyperlipidemia, unspecified: Secondary | ICD-10-CM

## 2022-02-08 DIAGNOSIS — N401 Enlarged prostate with lower urinary tract symptoms: Secondary | ICD-10-CM | POA: Diagnosis not present

## 2022-02-08 DIAGNOSIS — I482 Chronic atrial fibrillation, unspecified: Secondary | ICD-10-CM | POA: Diagnosis not present

## 2022-02-08 DIAGNOSIS — Z23 Encounter for immunization: Secondary | ICD-10-CM

## 2022-02-08 DIAGNOSIS — I2510 Atherosclerotic heart disease of native coronary artery without angina pectoris: Secondary | ICD-10-CM

## 2022-02-08 DIAGNOSIS — G3184 Mild cognitive impairment, so stated: Secondary | ICD-10-CM | POA: Diagnosis not present

## 2022-02-08 DIAGNOSIS — M545 Low back pain, unspecified: Secondary | ICD-10-CM

## 2022-02-08 DIAGNOSIS — D692 Other nonthrombocytopenic purpura: Secondary | ICD-10-CM

## 2022-02-08 MED ORDER — TIZANIDINE HCL 2 MG PO CAPS: 2.0000 mg | ORAL_CAPSULE | Freq: Three times a day (TID) | ORAL | 0 refills | Status: DC

## 2022-02-08 MED ORDER — ROSUVASTATIN CALCIUM 40 MG PO TABS: 40.0000 mg | ORAL_TABLET | Freq: Every day | ORAL | 1 refills | Status: DC

## 2022-02-08 MED ORDER — EZETIMIBE 10 MG PO TABS: 10.0000 mg | ORAL_TABLET | Freq: Every day | ORAL | 1 refills | Status: DC

## 2022-02-08 NOTE — Patient Instructions (Signed)
RSV vaccine in 2 weeks

## 2022-02-09 LAB — CBC WITH DIFFERENTIAL/PLATELET
Absolute Monocytes: 440 cells/uL (ref 200–950)
Basophils Absolute: 30 cells/uL (ref 0–200)
Basophils Relative: 0.6 %
Eosinophils Absolute: 80 cells/uL (ref 15–500)
Eosinophils Relative: 1.6 %
HCT: 47.6 % (ref 38.5–50.0)
Hemoglobin: 15.9 g/dL (ref 13.2–17.1)
Lymphs Abs: 1435 cells/uL (ref 850–3900)
MCH: 33.1 pg — ABNORMAL HIGH (ref 27.0–33.0)
MCHC: 33.4 g/dL (ref 32.0–36.0)
MCV: 99 fL (ref 80.0–100.0)
MPV: 10 fL (ref 7.5–12.5)
Monocytes Relative: 8.8 %
Neutro Abs: 3015 cells/uL (ref 1500–7800)
Neutrophils Relative %: 60.3 %
Platelets: 151 10*3/uL (ref 140–400)
RBC: 4.81 10*6/uL (ref 4.20–5.80)
RDW: 13.8 % (ref 11.0–15.0)
Total Lymphocyte: 28.7 %
WBC: 5 10*3/uL (ref 3.8–10.8)

## 2022-02-09 LAB — COMPLETE METABOLIC PANEL WITH GFR
AG Ratio: 1.6 (calc) (ref 1.0–2.5)
ALT: 40 U/L (ref 9–46)
AST: 37 U/L — ABNORMAL HIGH (ref 10–35)
Albumin: 3.9 g/dL (ref 3.6–5.1)
Alkaline phosphatase (APISO): 39 U/L (ref 35–144)
BUN: 14 mg/dL (ref 7–25)
CO2: 26 mmol/L (ref 20–32)
Calcium: 9 mg/dL (ref 8.6–10.3)
Chloride: 107 mmol/L (ref 98–110)
Creat: 0.79 mg/dL (ref 0.70–1.22)
Globulin: 2.5 g/dL (calc) (ref 1.9–3.7)
Glucose, Bld: 80 mg/dL (ref 65–139)
Potassium: 4.4 mmol/L (ref 3.5–5.3)
Sodium: 141 mmol/L (ref 135–146)
Total Bilirubin: 1 mg/dL (ref 0.2–1.2)
Total Protein: 6.4 g/dL (ref 6.1–8.1)
eGFR: 88 mL/min/{1.73_m2} (ref >=60)

## 2022-02-09 LAB — LIPID PANEL
Cholesterol: 158 mg/dL (ref <200)
HDL: 50 mg/dL (ref >=40)
LDL Cholesterol (Calc): 88 mg/dL (calc)
Non-HDL Cholesterol (Calc): 108 mg/dL (calc) (ref <130)
Total CHOL/HDL Ratio: 3.2 (calc) (ref <5.0)
Triglycerides: 106 mg/dL (ref <150)

## 2022-02-15 ENCOUNTER — Ambulatory Visit: Payer: PPO | Admitting: Orthopedic Surgery

## 2022-02-21 ENCOUNTER — Other Ambulatory Visit: Payer: Self-pay | Admitting: Urology

## 2022-02-21 DIAGNOSIS — N401 Enlarged prostate with lower urinary tract symptoms: Secondary | ICD-10-CM

## 2022-02-21 NOTE — Telephone Encounter (Signed)
Should this be filled by PCP? Last OV 05/2021 return as needed, please advise

## 2022-02-22 ENCOUNTER — Other Ambulatory Visit
Admission: RE | Admit: 2022-02-22 | Discharge: 2022-02-22 | Disposition: A | Discharge status: Home / Self Care | Payer: PPO | Attending: Internal Medicine | Admitting: Internal Medicine

## 2022-02-22 DIAGNOSIS — I4821 Permanent atrial fibrillation: Secondary | ICD-10-CM | POA: Diagnosis not present

## 2022-02-22 DIAGNOSIS — Z79899 Other long term (current) drug therapy: Secondary | ICD-10-CM | POA: Insufficient documentation

## 2022-02-22 LAB — DIGOXIN LEVEL: Digoxin Level: <0.2 ng/mL — ABNORMAL LOW (ref 0.8–2.0)

## 2022-03-01 ENCOUNTER — Telehealth: Payer: Self-pay

## 2022-03-01 ENCOUNTER — Ambulatory Visit (INDEPENDENT_AMBULATORY_CARE_PROVIDER_SITE_OTHER): Payer: PPO

## 2022-03-01 DIAGNOSIS — I428 Other cardiomyopathies: Secondary | ICD-10-CM | POA: Diagnosis not present

## 2022-03-01 LAB — CUP PACEART REMOTE DEVICE CHECK
Battery Remaining Longevity: 66 mo
Battery Voltage: 2.91 V
Brady Statistic AP VP Percent: 0 %
Brady Statistic AP VS Percent: 0 %
Brady Statistic AS VP Percent: 4.95 %
Brady Statistic AS VS Percent: 95.05 %
Brady Statistic RA Percent Paced: 0 %
Brady Statistic RV Percent Paced: 6.62 %
Date Time Interrogation Session: 20231024031705
HighPow Impedance: 71 Ohm
Implantable Lead Connection Status: 753985
Implantable Lead Connection Status: 753985
Implantable Lead Implant Date: 20130502
Implantable Lead Implant Date: 20130502
Implantable Lead Location: 753859
Implantable Lead Location: 753860
Implantable Lead Model: 5076
Implantable Lead Model: 6935
Implantable Pulse Generator Implant Date: 20181031
Lead Channel Impedance Value: 342 Ohm
Lead Channel Impedance Value: 456 Ohm
Lead Channel Impedance Value: 551 Ohm
Lead Channel Pacing Threshold Amplitude: 0.875 V
Lead Channel Pacing Threshold Pulse Width: 0.4 ms
Lead Channel Sensing Intrinsic Amplitude: 2.5 mV
Lead Channel Sensing Intrinsic Amplitude: 2.5 mV
Lead Channel Setting Pacing Amplitude: 2.5 V
Lead Channel Setting Pacing Pulse Width: 0.4 ms
Lead Channel Setting Sensing Sensitivity: 0.3 mV
Zone Setting Status: 755011
Zone Setting Status: 755011

## 2022-03-01 NOTE — Progress Notes (Unsigned)
Assessment/Plan:   1.  Essential Tremor.  -This is evidenced by the symmetrical nature and longstanding hx of gradually getting worse.  We discussed nature and pathophysiology.  We discussed that this can continue to gradually get worse with time.  We discussed that some medications can worsen this, as can caffeine use.  We discussed medication therapy as well as surgical therapy.  The patient has previously been on a beta-blocker, which lowered blood pressure such that cardiology took him off of it.  He is back on a beta-blocker, bisoprolol, and seems to be tolerating that.  He cannot be on primidone because of interaction with Xarelto.  We discussed that he would be a primidone candidate if he wanted to change to Coumadin, but understandably that is not really something he wants to do.  We discussed savaysa, as that does not interact with primidone, but is very expensive.  We discussed [employer], a nonsurgical option to treating tremor.  He was given information on that, but ultimately he decided that was too expensive as it is not covered by Medicare.  We discussed deep brain stimulation and focused ultrasound in detail.  We discussed the differences.  While deep brain stimulation would be able to be done bilaterally, he would need to be off his Xarelto for staged procedures.  In addition, I am not sure he would be a surgical candidate (especially given that his defibrillator just went off 2 days ago).  We discussed focused ultrasound.  At this point in time, it is currently only indicated for unilateral procedures, so it would not give him head control, but would give unilateral tremor control.  We would need to make sure that his devices were MRI compatible, as this is done in an MRI machine.  The patient feels that it is.  Beyond these options, I really do not think he has other great options, unfortunately.  The other tremor medications probably have greater side effects than they do benefits.  There is  currently some studies going on for Botox for essential tremor, but it is currently not approved for such and Medicare would not pay for it that way.  Patient is going to think about these things and is going to follow-up with his current neurologist.    Subjective:   Chris Price was seen in consultation in the movement disorder clinic at the request of [name], MD.  The evaluation is for tremor.This patient is accompanied in the office by his spouse who supplements the history.  Patient has seen other neurologists for this as well, including Dr. [name] at [facility] neurology in Dr. Rexene Alberts at [facility] neurology.  I have reviewed records.  Patient started seeing Dr. [name] in 2020 and was diagnosed with essential tremor.  He had a second opinion with Dr. Rexene Alberts in April, 2021.  No medications were started.  It does not appear he saw anybody for a few years, but restarted with Dr.Shah in March, 2023 and gabapentin was started.  He was last seen by the PA at [facility] neurology on June 16.  Gabapentin was increased to 400 mg twice per day.  He was felt not to be a good candidate for topiramate because of potential cognitive dulling.  Pt is R hand dominant but both hands shake equally.  He now has head tremor x few years.  No falls.  Good balance but feet are numb from "spinal injuries" x years.  No DM.  Mother with tremor.  No other  fam hx of tremor.     Affected by caffeine:  doesn't drink any Affected by alcohol: Not applicable (does not drink alcohol) Affected by stress:  Yes.   Affected by fatigue:  Yes.   Spills soup if on spoon:  No. Spills glass of liquid if full:  No. Affects ADL's (tying shoes, brushing teeth, etc):  No.  Current/Previously tried tremor medications: Has previously been on a beta-blocker which was discontinued by cardiology because of low blood pressure but now on bisoprolol; gabapentin   Outside reports reviewed: MRI brain was performed September 03, 2021.  I personally  reviewed it.  There is moderate atrophy and mild white matter disease.  Allergies  Allergen Reactions   Coreg [Carvedilol] Other (See Comments)    hypotension   Flomax [Tamsulosin Hcl] Other (See Comments)    Hypotension    Current Meds  Medication Sig   B Complex-Folic Acid (SUPER B COMPLEX MAXI) TABS Take 1 tablet by mouth daily.   bisoprolol (ZEBETA) 5 MG tablet Take 1 tablet (5 mg) by mouth twice a day   digoxin (LANOXIN) 0.25 MG tablet Take 1 tablet (0.25 mg total) by mouth daily. (Patient taking differently: Take 0.25 mg by mouth 2 (two) times daily.)   empagliflozin (JARDIANCE) 10 MG TABS tablet Take 1 tablet (10 mg total) by mouth daily.   ezetimibe (ZETIA) 10 MG tablet Take 1 tablet (10 mg total) by mouth daily.   finasteride (PROSCAR) 5 MG tablet TAKE ONE TABLET BY MOUTH EVERY DAY   furosemide (LASIX) 20 MG tablet Take 1 tablet (20 mg) by mouth EVERY OTHER day   gabapentin (NEURONTIN) 400 MG capsule Take 400 mg by mouth 2 (two) times daily.   hydrochlorothiazide (MICROZIDE) 12.5 MG capsule Take 1 tablet (12.5 mg) by mouth once daily in the morning   lidocaine (LIDODERM) 5 % Place 2 patches onto the skin daily. Remove & Discard patch within 12 hours or as directed by MD   loratadine (CLARITIN) 10 MG tablet TAKE ONE TABLET BY MOUTH DAILY AS NEEDED FOR ALLERGIES   multivitamin (ONE-A-DAY MEN'S) TABS tablet Take 1 tablet by mouth daily.   nitroGLYCERIN (NITROSTAT) 0.4 MG SL tablet Place 1 tablet (0.4 mg total) under the tongue every 5 (five) minutes as needed for chest pain.   omeprazole (PRILOSEC) 40 MG capsule Take 1 capsule (40 mg total) by mouth daily before breakfast. (Patient taking differently: Take 40 mg by mouth daily as needed.)   QUEtiapine (SEROQUEL) 25 MG tablet TAKE 1 TABLET BY MOUTH AT BEDTIME   rivaroxaban (XARELTO) 20 MG TABS tablet TAKE ONE TABLET BY MOUTH DAILY WITH SUPPER   rosuvastatin (CRESTOR) 40 MG tablet Take 1 tablet (40 mg total) by mouth daily.    tizanidine (ZANAFLEX) 2 MG capsule Take 1 capsule (2 mg total) by mouth 3 (three) times daily.   triamcinolone ointment (KENALOG) 0.1 % Apply 1 application topically daily as needed.   vitamin C (ASCORBIC ACID) 500 MG tablet Take 500 mg by mouth daily.      Objective:   VITALS:   Vitals:   03/03/22 1351  BP: 131/84  Pulse: 71  SpO2: 98%  Weight: 203 lb 12.8 oz (92.4 kg)  Height: '5\' 6"'$  (1.676 m)   Gen:  Appears stated age and in NAD. HEENT:  Normocephalic, atraumatic. The mucous membranes are moist. The superficial temporal arteries are without ropiness or tenderness. Cardiovascular: irreg Lungs: Clear to auscultation bilaterally. Neck: There are no carotid bruits noted bilaterally.  NEUROLOGICAL:  Orientation:  The patient is alert and oriented x 3.   Cranial nerves: There is good facial symmetry. There is congenital strabismus. visual fields are full to confrontational testing. Speech is fluent and clear. Soft palate rises symmetrically and there is no tongue deviation. Hearing is intact to conversational tone. Tone: Tone is good throughout. Sensation: Sensation is intact to light touch touch throughout (facial, trunk, extremities). Vibration is decreased distally. There is no extinction with double simultaneous stimulation. There is no sensory dermatomal level identified. Coordination:  The patient has no dysdiadichokinesia or dysmetria. Motor: Strength is 5/5 in the bilateral upper and lower extremities.  Shoulder shrug is equal bilaterally.  There is no pronator drift.  There are no fasciculations noted. Gait and Station: The patient ambulates in the hall without much difficulty.  MOVEMENT EXAM: Tremor:  There is no rest tremor.  There is head tremor in the "yes" direction.  There is mild postural tremor that becomes at least moderate on the right and mild to moderate on the left with intention.  He has trouble with Archimedes spirals bilaterally, right greater than left.  He  has trouble pouring water from 1 glass to another, mostly on the right.  I have reviewed and interpreted the following labs independently   Chemistry      Component Value Date/Time   NA 141 02/08/2022 0931   NA 145 (H) 08/24/2021 0857   NA 136 07/09/2013 0155   K 4.4 02/08/2022 0931   K 3.8 07/09/2013 0155   CL 107 02/08/2022 0931   CL 109 (H) 07/09/2013 0155   CO2 26 02/08/2022 0931   CO2 22 07/09/2013 0155   BUN 14 02/08/2022 0931   BUN 17 08/24/2021 0857   BUN 21 (H) 07/09/2013 0155   CREATININE 0.79 02/08/2022 0931      Component Value Date/Time   CALCIUM 9.0 02/08/2022 0931   CALCIUM 7.9 (L) 07/09/2013 0155   ALKPHOS 55 01/08/2021 0946   ALKPHOS 60 08/29/2011 1955   AST 37 (H) 02/08/2022 0931   AST 31 08/29/2011 1955   ALT 40 02/08/2022 0931   ALT 35 08/29/2011 1955   BILITOT 1.0 02/08/2022 0931   BILITOT 1.1 01/08/2021 0946   BILITOT 0.8 08/29/2011 1955      Lab Results  Component Value Date   WBC 5.0 02/08/2022   HGB 15.9 02/08/2022   HCT 47.6 02/08/2022   MCV 99.0 02/08/2022   PLT 151 02/08/2022   Lab Results  Component Value Date   TSH 1.17 06/07/2021      Total time spent on today's visit was 60 minutes, including both face-to-face time and nonface-to-face time.  Time included that spent on review of records (prior notes available to me/labs/imaging if pertinent), discussing treatment and goals, answering patient's questions and coordinating care.  CC:  [name], MD

## 2022-03-01 NOTE — Telephone Encounter (Signed)
Scheduled remote reviewed. Normal device function.  771 NSVT (increased) which EGM's shows just AF/RVR. Cardiac compass clearly shows increase in rates over the past few few days. 1 VF episode logged with EGM appears to begin AF/RVR avg rates 180-190bpm, then organized regular rhythm at 230bpm- ATP x1 during charging, term to AF/RVR. Rich- Xarelto  Attempted to contact patient to review s/s and medication compliance. No answer, LMTCB.  Reviewed with Dr. Caryl Comes and verbal order obtained to from Dr. Caryl Comes for patient to increase his bisoprolol 5 mg BID. Also patient will need driving restrictions and shock plan reviewed.

## 2022-03-02 NOTE — Telephone Encounter (Signed)
LMTCB on home phone and sent mychart message requesting a call back.

## 2022-03-03 ENCOUNTER — Ambulatory Visit: Payer: PPO | Admitting: Neurology

## 2022-03-03 ENCOUNTER — Encounter: Payer: Self-pay | Admitting: Neurology

## 2022-03-03 VITALS — BP 131/84 | HR 71 | Ht 66.0 in | Wt 203.8 lb

## 2022-03-03 DIAGNOSIS — I4821 Permanent atrial fibrillation: Secondary | ICD-10-CM | POA: Diagnosis not present

## 2022-03-03 DIAGNOSIS — G250 Essential tremor: Secondary | ICD-10-CM | POA: Diagnosis not present

## 2022-03-03 MED ORDER — DIGOXIN 250 MCG PO TABS: ORAL_TABLET | ORAL | 6 refills | Status: DC

## 2022-03-03 MED ORDER — BISOPROLOL FUMARATE 5 MG PO TABS: ORAL_TABLET | ORAL | 1 refills | Status: DC

## 2022-03-03 NOTE — Telephone Encounter (Signed)
Patients wife called, advised to increase bisoprolol to 5 mg BID. Wife voiced understanding. Also advised of driving restrictions x6 months with start date of 02/28/22 per [place] DMV. Shock plan reviewed.   Wife would like return call about recent labs drawn @ [phone]. States they did not receive follow up on results.

## 2022-03-03 NOTE — Telephone Encounter (Signed)
Reviewed the patient's chart with Dr. Caryl Comes regarding most recent lab work.  The patient had a digoxin level on 02/22/22. Digoxin level <0.2.  Per Dr. Caryl Comes, need to confirm the patient is actually taking digoxin 0.25 mg once daily and if so, he will need to: 1) INCREASE digoxin 0.25 mg: -take 1.5 tablets (0.375 mg) by mouth once daily   I called Mrs. O'Brien (ok per DPR) and advised her of the patient's results and Dr. Olin Pia recommendation as stated above. Per Mrs. O'Brien, the patient is currently taking Digoxin 0.25 mg once daily. She would like this RX updated at [facility].   Mrs. Kitson also wanted to go over again what happened with his heart rhyhthm as she was very overwhelmed when she was called earlier today. She could not understand the need for driving restrictions for 6 months.  I advised Mrs. O'Brien that per Dr. Caryl Comes, he was having a "double tachycardia" where both the top and bottom chambers of the heart were going fast.  The device saw that the ventricular rhythm was abnormal and was able to pace him out of the rhythm prior to a shock.  She could not understand why the patient had to be on a driving restriction for 6 months. I advised her that this is a [place] law, not our office, stating this. She voiced understanding and was agreeable.  She then wanted to know the date and time of the actual ATP- I advised her that I think this occurred on 02/28/22, but I could not verify a time.  Will forward to the [facility] team to please clarify and call her back with this information. She would like to be called back on [phone].   Updated RX for digoxin sent to [facility].

## 2022-03-03 NOTE — Telephone Encounter (Signed)
Called and updated patients wife, advised his ATP occurred 02/28/22 @ 14:00. Voiced understanding and appreciative of call.

## 2022-03-03 NOTE — Patient Instructions (Signed)
Today we discussed:  Focused ultrasound (the ultrasound surgery at [facility] for this but we would need to make sure your pacemaker and defibrillator would be MRI compatible) Deep brain stimulation (harder given that you are on xarelto and would need to be off for multiple surgeries Changing xarelto to coumadin or savaysa so that we could use primidone [employer], the watch like device that is expensive  The physicians and staff at [facility] are committed to providing excellent care. You may receive a survey requesting feedback about your experience at our office. We strive to receive "very good" responses to the survey questions. If you feel that your experience would prevent you from giving the office a "very good " response, please contact our office to try to remedy the situation. We may be reached at [phone]. Thank you for taking the time out of your busy day to complete the survey.

## 2022-03-04 NOTE — Telephone Encounter (Signed)
Mr inclan son in law tried to help the patient send a transmission but got the 3248 error code.   Per Benjamine Mola I asked the patient how he was feeling and he is feeling great. I let them know if the patient has any SOB, dizzyness, feel like he is going to pass out or passes out, He needs to go to the ER. I told them we will see if the monitor will send an automatic transmission over the weekend. I also gave him the number to tech support to order a new handheld. \\  The wife would like for someone to my chart the med list to her and the dosages so she can make sure she is giving him the right meds and dosage.

## 2022-03-04 NOTE — Telephone Encounter (Signed)
I got a message that the patient ICD was alarming. I tried to get him to send a transmission unsuccessfully.   I called his wife and asked her to have their son in law to help send the manual transmission. She agreed. She also asked can we send a list of his medications he supposed to take in My chart. The recent changes have her a little scared to touch his medicines.

## 2022-03-07 NOTE — Telephone Encounter (Signed)
Updated medication list sent via Mychart as requested.

## 2022-03-07 NOTE — Telephone Encounter (Signed)
The patient wife called to let us know that his monitor was not working. I let her know that we scheduled the transmission to see if it will come across by itself.   She requested again that the nurse will send the patient recent med list into my chart so she can know what needed to be given to the patient. Sonia Baller sent the message to [facility].

## 2022-03-11 DIAGNOSIS — G4733 Obstructive sleep apnea (adult) (pediatric): Secondary | ICD-10-CM | POA: Diagnosis not present

## 2022-03-21 NOTE — Progress Notes (Signed)
Remote ICD transmission.   

## 2022-03-29 ENCOUNTER — Encounter: Payer: Self-pay | Admitting: Internal Medicine

## 2022-03-29 ENCOUNTER — Ambulatory Visit: Payer: PPO | Attending: Internal Medicine | Admitting: Internal Medicine

## 2022-03-29 VITALS — BP 110/60 | HR 67 | Ht 66.0 in | Wt 206.0 lb

## 2022-03-29 DIAGNOSIS — E782 Mixed hyperlipidemia: Secondary | ICD-10-CM

## 2022-03-29 DIAGNOSIS — I428 Other cardiomyopathies: Secondary | ICD-10-CM

## 2022-03-29 DIAGNOSIS — Z95810 Presence of automatic (implantable) cardiac defibrillator: Secondary | ICD-10-CM | POA: Diagnosis not present

## 2022-03-29 DIAGNOSIS — I5020 Unspecified systolic (congestive) heart failure: Secondary | ICD-10-CM | POA: Diagnosis not present

## 2022-03-29 DIAGNOSIS — I10 Essential (primary) hypertension: Secondary | ICD-10-CM | POA: Diagnosis not present

## 2022-03-29 DIAGNOSIS — I4821 Permanent atrial fibrillation: Secondary | ICD-10-CM | POA: Diagnosis not present

## 2022-03-29 LAB — CUP PACEART INCLINIC DEVICE CHECK
Battery Remaining Longevity: 55 mo
Battery Voltage: 2.98 V
Brady Statistic AP VP Percent: 0 %
Brady Statistic AP VS Percent: 0 %
Brady Statistic AS VP Percent: 7.94 %
Brady Statistic AS VS Percent: 92.06 %
Brady Statistic RA Percent Paced: 0 %
Brady Statistic RV Percent Paced: 10.01 %
Date Time Interrogation Session: 20231121142312
HighPow Impedance: 63 Ohm
Implantable Lead Connection Status: 753985
Implantable Lead Connection Status: 753985
Implantable Lead Implant Date: 20130502
Implantable Lead Implant Date: 20130502
Implantable Lead Location: 753859
Implantable Lead Location: 753860
Implantable Lead Model: 5076
Implantable Lead Model: 6935
Implantable Pulse Generator Implant Date: 20181031
Lead Channel Impedance Value: 437 Ohm
Lead Channel Impedance Value: 456 Ohm
Lead Channel Impedance Value: 608 Ohm
Lead Channel Pacing Threshold Amplitude: 0.75 V
Lead Channel Pacing Threshold Pulse Width: 0.4 ms
Lead Channel Sensing Intrinsic Amplitude: 3 mV
Lead Channel Sensing Intrinsic Amplitude: 3.5 mV
Lead Channel Setting Pacing Amplitude: 2.5 V
Lead Channel Setting Pacing Pulse Width: 0.4 ms
Lead Channel Setting Sensing Sensitivity: 0.3 mV
Zone Setting Status: 755011
Zone Setting Status: 755011

## 2022-03-29 LAB — PACEMAKER DEVICE OBSERVATION

## 2022-03-29 MED ORDER — LISINOPRIL 2.5 MG PO TABS: 2.5000 mg | ORAL_TABLET | Freq: Every day | ORAL | 3 refills | Status: DC

## 2022-03-29 NOTE — Progress Notes (Signed)
Patient ID: Chris Price, male   DOB: [birth year], 81 y.o.   MRN: [id number]  Patient Care Team: Chris Sizer, MD as PCP - General (Family Medicine) Chris Sprang, MD as PCP - Cardiology (Cardiology) Chris Sprang, MD as Consulting Physician (Cardiology) Chris Espy, MD as Consulting Physician (Urology) Chris Price, [facility] (Pharmacist) Tat, Eustace Quail, DO as Consulting Physician (Neurology)   HPI  Chris Price is a 81 y.o. male Seen in followup for an ICD implanted (DOI 2013/ gen change 10/18)for primary prevention in nonischemic cardiomyopathy further complicated by sinus bradycardia   Atrial fibrillation, managed previously with amiodarone but now permanent.   Anticoagulation with Rivaroxaban  no bleeding   Has had hypotension limiting rate control  and cardiomyopathy drugs ( ie aldactone/entresto)     Because of dyspnea on exertion 1/23, bisoprolol was increased, digoxin continued and Jardiance, (from [place]) was anticipated but not fulfilled  TREMOR has been an issue with rare reports of it assoc with lisinopril.  No change.    Interval atrial fibrillation with rapid rates associated with ventricular tachycardia successfully paced terminated.  No driving and this has been emotionally traumatic.  Bisoprolol dose was increased, he has tolerated this well. The patient denies chest pain, shortness of breath, nocturnal dyspnea, orthopnea or peripheral edema.  There have been no palpitations, lightheadedness or syncope.  Marland Kitchen     DATE Test EF%   3/15 Cath  35 % Nonobstructive disease  4/22  Echo 30-35%   4/22 Myoview  35% No ischemia           Date Cr K Hgb Dig  10/18 0.95 4.9     3/20 0.96 4.5 14.8   3/21 1.03 4.3 14.9   11/21 1.0 4.2 15.6   4/22 1.01 4.1 14.4   1/23 0.87 4.7 15.9 <0.4  6/23 0.88 3.7     Thromboembolic risk factors ( age  -2, HTN-1, Vasc disease -1, CHF-1,) for a CHADSVASc Score of 5  Past Medical History:  Diagnosis Date   Chronic  HFrEF (heart failure with reduced ejection fraction) ([place])    a. 08/2020 Echo: EF 30-35%, glob HK, mild LVH. Low nl RV fxn. Sev BAE. Mild MR. Mild-mod TR. Mild AI. Ao sclerosis.   Degenerative disk disease    lumbar   Disc herniation    GERD (gastroesophageal reflux disease)    Hyperlipidemia    Hypertension    ICD (implantable cardiac defibrillator) in place    a. 02/2017 gen change to MDT MRI compatible single lead device. Ser # [id number].   IVCD (intraventricular conduction defect)    Nonspecific QRS duration 122   Lumbar spinal stenosis    Neuromuscular disorder (HCC)    neauropathy   / siatica   NICM (nonischemic cardiomyopathy) ([place])    a. 07/2011 Echo: EF 25-30%-->s/p ICD; b. 02/2017 s/p ICD gen change; c. 08/2020 Echo: EF 30-35%.   Non-obstructive Coronary artery disease 08/15/2011   a. 08/2011 Cath: Moderate nonobstructive CAD; b. 07/2013 Cath: LM nl, LAD 39m D1 50/751mLCX 5070mCA 30p, 50d-->Med rx; c. 08/2020 MV: EF 35%. No ischemia. Fixed apical defect.   [facility]  Sleep apnea    uses cpap    Past Surgical History:  Procedure Laterality Date   CARDIAC CATHETERIZATION  [facility]/2011   [facility]    CARDIAC [facility]HE[facility]ATION  07/09/2013   [facility]  [facility]   CHOLECYSTECTOMY  April 2015  COLONOSCOPY WITH PROPOFOL N/A 07/30/2015   Procedure: COLONOSCOPY WITH PROPOFOL;  Surgeon: Lollie Sails, MD;  Location: [facility] ENDOSCOPY;  Service: Endoscopy;  Laterality: N/A;   cooled thermotherapy  3016   [place], complicated by incontinence   HERNIA REPAIR     ICD  09/08/2011   ICD GENERATOR CHANGEOUT N/A 03/08/2017   Procedure: ICD GENERATOR CHANGEOUT;  Surgeon: Chris Sprang, MD;  Location: [facility] CV LAB;  Service: Cardiovascular;  Laterality: N/A;   IMPLANTABLE CARDIOVERTER DEFIBRILLATOR IMPLANT  09/08/2011   Procedure: IMPLANTABLE CARDIOVERTER DEFIBRILLATOR IMPLANT;  Surgeon: Chris Sprang, MD;  Location: [facility] CATH LAB;  Service:  Cardiovascular;;   LUMBAR LAMINECTOMY     L2 through S1 with wide decompression of the thecal sac and nerve roots.   PROSTATE BIOPSY  2013    Current Outpatient Medications  Medication Sig Dispense Refill   B Complex-Folic Acid (SUPER B COMPLEX MAXI) TABS Take 1 tablet by mouth daily.     bisoprolol (ZEBETA) 5 MG tablet Take 1 tablet (5 mg) by mouth twice a day 90 tablet 1   digoxin (LANOXIN) 0.25 MG tablet Take 1.5 tablets (0.375 mg) by mouth once daily 45 tablet 6   empagliflozin (JARDIANCE) 10 MG TABS tablet Take 1 tablet (10 mg total) by mouth daily. 90 tablet 3   ezetimibe (ZETIA) 10 MG tablet Take 1 tablet (10 mg total) by mouth daily. 90 tablet 1   finasteride (PROSCAR) 5 MG tablet TAKE ONE TABLET BY MOUTH EVERY DAY 90 tablet 3   furosemide (LASIX) 20 MG tablet Take 1 tablet (20 mg) by mouth EVERY OTHER day 90 tablet 1   gabapentin (NEURONTIN) 400 MG capsule Take 400 mg by mouth 2 (two) times daily.     hydrochlorothiazide (MICROZIDE) 12.5 MG capsule Take 1 tablet (12.5 mg) by mouth once daily in the morning 90 capsule 1   lidocaine (LIDODERM) 5 % Place 2 patches onto the skin daily. Remove & Discard patch within 12 hours or as directed by MD 60 patch 0   loratadine (CLARITIN) 10 MG tablet TAKE ONE TABLET BY MOUTH DAILY AS NEEDED FOR ALLERGIES 90 tablet 1   multivitamin (ONE-A-DAY MEN'S) TABS tablet Take 1 tablet by mouth daily.     nitroGLYCERIN (NITROSTAT) 0.4 MG SL tablet Place 1 tablet (0.4 mg total) under the tongue every 5 (five) minutes as needed for chest pain. 25 tablet 1   omeprazole (PRILOSEC) 40 MG capsule Take 1 capsule (40 mg total) by mouth daily before breakfast. (Patient taking differently: Take 40 mg by mouth daily as needed.) 90 capsule 1   QUEtiapine (SEROQUEL) 25 MG tablet TAKE 1 TABLET BY MOUTH AT BEDTIME 90 tablet 1   rivaroxaban (XARELTO) 20 MG TABS tablet TAKE ONE TABLET BY MOUTH DAILY WITH SUPPER 90 tablet 1   rosuvastatin (CRESTOR) 40 MG tablet Take 1 tablet  (40 mg total) by mouth daily. 90 tablet 1   tizanidine (ZANAFLEX) 2 MG capsule Take 1 capsule (2 mg total) by mouth 3 (three) times daily. 30 capsule 0   triamcinolone ointment (KENALOG) 0.1 % Apply 1 application topically daily as needed.     vitamin C (ASCORBIC ACID) 500 MG tablet Take 500 mg by mouth daily.     No current facility-administered medications for this visit.    Allergies  Allergen Reactions   Coreg [Carvedilol] Other (See Comments)    hypotension   Flomax [Tamsulosin Hcl] Other (See Comments)    Hypotension    Review  of Systems negative except from HPI and PMH  Physical Exam: BP 110/60 (BP Location: Left Arm, Patient Position: Sitting, Cuff Size: Normal)   Pulse 67   Ht '5\' 6"'$  (1.676 m)   Wt 206 lb (93.4 kg)   SpO2 91%   BMI 33.25 kg/m  Well developed and well nourished in no acute distress HENT normal Neck supple with JVP-flat Clear Device pocket well healed; without hematoma or erythema.  There is no tethering  Irregularly Irregular rate and rhythm with  controlled  ventricular response  Abd-soft with active BS No Clubbing cyanosis  edema Skin-warm and dry A & Oriented  Grossly normal sensory and motor function  ECG atrial fibrillation at 67 -/14/43 Device function is normal. Programming changes decreased ventricular sensitivity from 0.3--0.6 See Paceart for details -   ECG atrial fibrillation at 71 Intervals-/15/45 Right bundle branch block left anterior fascicular block  ECG 4 /22 was associated with left bundle branch block  Assessment and  Plan  NICM  Congestive heart failure-acute/chronic-systolic grade 3  Afib-permanent  Ventricular tachycardia-intercurrent  Hypotension limiting guideline directed therapy  IVCD/LBBB  Tremor-worsening  ICD-MDT    Bradycardia 4% ventricular pacing  Euvolemic.  Continue him on furosemide and hydrochlorothiazide. With improvement in his tremor, will resume lisinopril in conjunction to his  recently uptitrated bisoprolol from 5--10 in the context of his atrial fibrillation.  Not withstanding up titration of his digoxin, his level remains 0.2  Struggling with not driving.  Secondary to intercurrent ventricular tachycardia which occurred in the setting of atrial fibrillation with a rapid rate

## 2022-03-29 NOTE — Patient Instructions (Addendum)
Medication Instructions:  - Your physician has recommended you make the following change in your medication:   1) Resume Lisinopril 2.5 mg: - take 1 tablet by mouth once daily   *If you need a refill on your cardiac medications before your next appointment, please call your pharmacy*   Lab Work: - none ordered  If you have labs (blood work) drawn today and your tests are completely normal, you will receive your results only by: [place] (if you have MyChart) OR A paper copy in the mail If you have any lab test that is abnormal or we need to change your treatment, we will call you to review the results.   Testing/Procedures: - none ordered   Follow-Up: At [facility], you and your health needs are our priority.  As part of our continuing mission to provide you with exceptional heart care, we have created designated Provider Care Teams.  These Care Teams include your primary Cardiologist (physician) and Advanced Practice Providers (APPs -  Physician Assistants and Nurse Practitioners) who all work together to provide you with the care you need, when you need it.  We recommend signing up for the patient portal called "MyChart".  Sign up information is provided on this After Visit Summary.  MyChart is used to connect with patients for Virtual Visits (Telemedicine).  Patients are able to view lab/test results, encounter notes, upcoming appointments, etc.  Non-urgent messages can be sent to your provider as well.   To learn more about what you can do with MyChart, go to [email or web address].    Your next appointment:   6 month(s)  The format for your next appointment:   In Person  Provider:   Virl Axe, MD    Other Instructions N/a  Important Information About Sugar

## 2022-03-30 ENCOUNTER — Other Ambulatory Visit: Payer: Self-pay | Admitting: Nurse Practitioner

## 2022-04-04 ENCOUNTER — Other Ambulatory Visit: Payer: Self-pay | Admitting: Nurse Practitioner

## 2022-04-30 IMAGING — MR MR HEAD W/O CM
9 of 10 series · 38 of 48 positions shown · non-contrast
Comparison: None available.

CLINICAL DATA: Memory loss, tremors

EXAM:
MRI HEAD WITHOUT CONTRAST
TECHNIQUE: Multiplanar, multiecho pulse sequences of the brain and surrounding
structures were obtained without intravenous contrast.

[Series 3: DWI · axial · 3.0mm · 1.09mm/px · z∈[-56,+72]mm · 11 of 88 slices shown (1 of 4)]
[im 1/88]
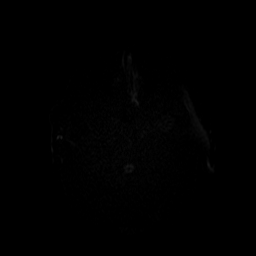
[im 9/88]
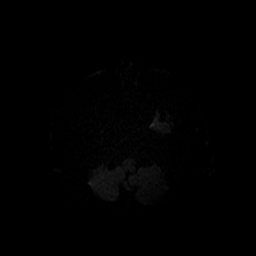
[im 18/88]
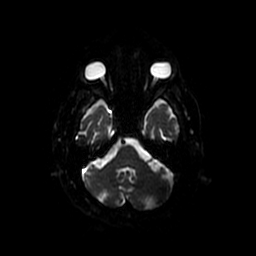
[im 27/88]
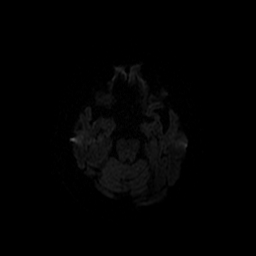
[im 35/88]
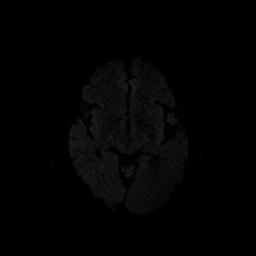
[im 44/88]
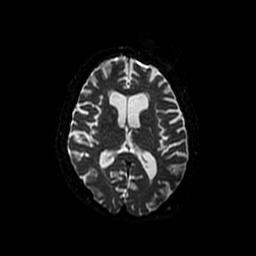
[im 53/88]
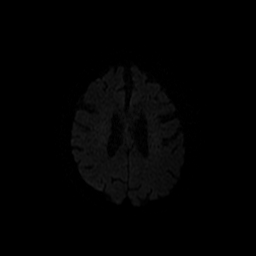
[im 61/88]
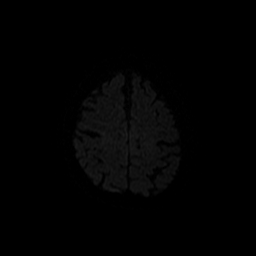
[im 70/88]
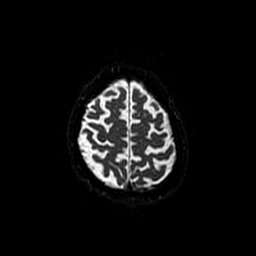
[im 79/88]
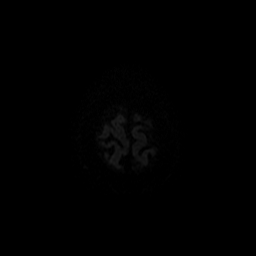
[im 88/88]
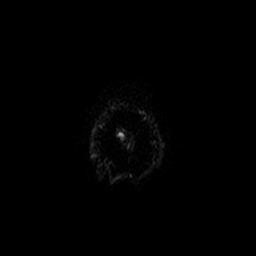

[Series 4: DWI · coronal · 5.0mm · 1.09mm/px · 7 of 68 slices shown (2 of 4)]
[im 1/68]
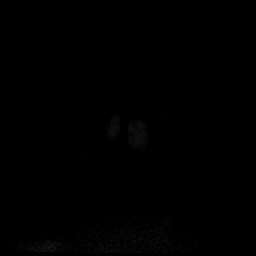
[im 12/68]
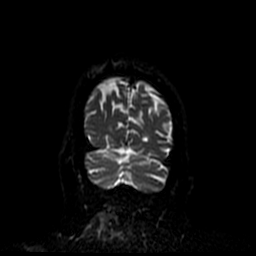
[im 23/68]
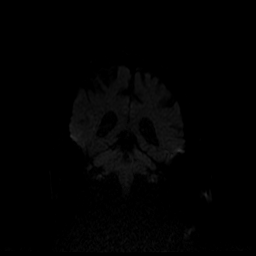
[im 34/68]
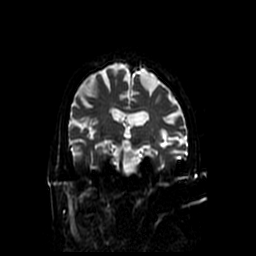
[im 45/68]
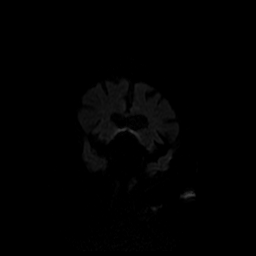
[im 56/68]
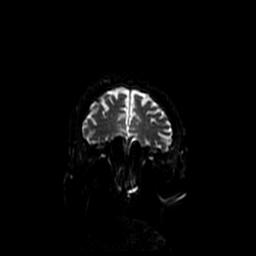
[im 68/68]
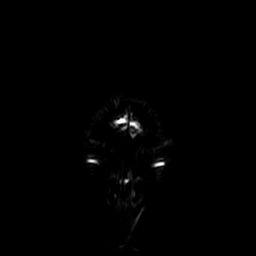

[Series 5: T1 · sagittal · 5.0mm · 0.47mm/px · 2 of 23 slices shown (1 of 2)]
[im 1/23]
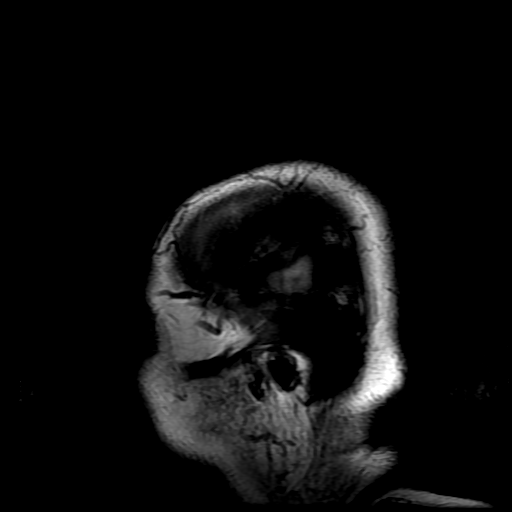
[im 23/23]
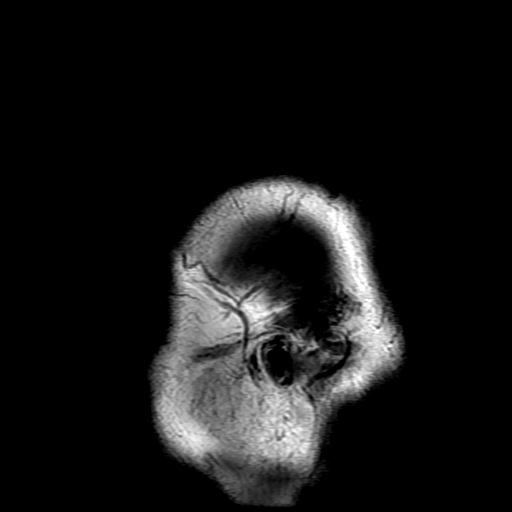

[Series 6: T2 · axial · 5.0mm · 0.43mm/px · z∈[-60,+71]mm · 2 of 23 slices shown (1 of 2)]
[im 1/23]
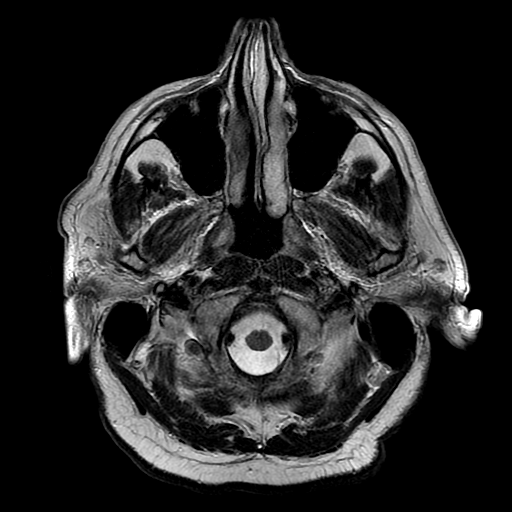
[im 23/23]
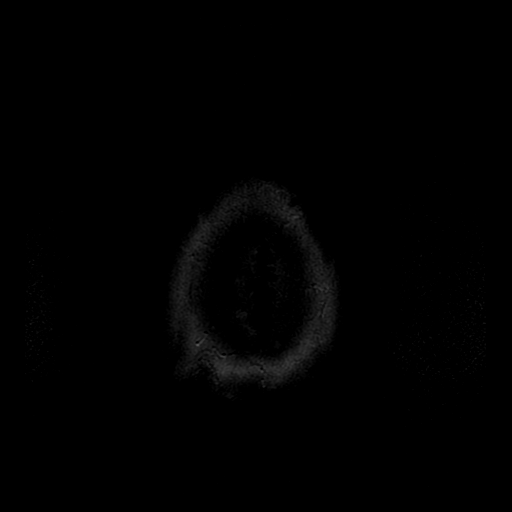

[Series 7: FLAIR · axial · 3.0mm · 0.43mm/px · z∈[-53,+71]mm · 2 of 22 slices shown]
[im 1/22]
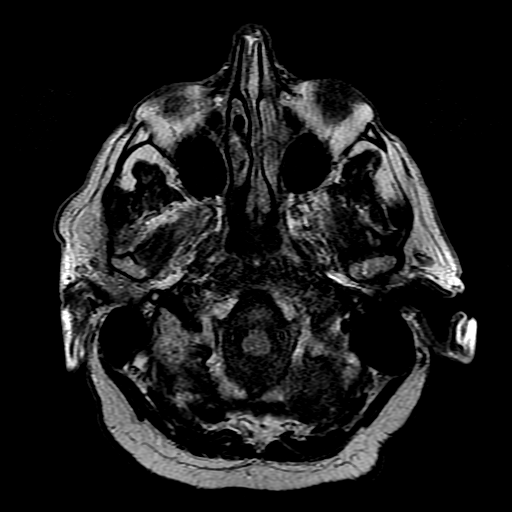
[im 22/22]
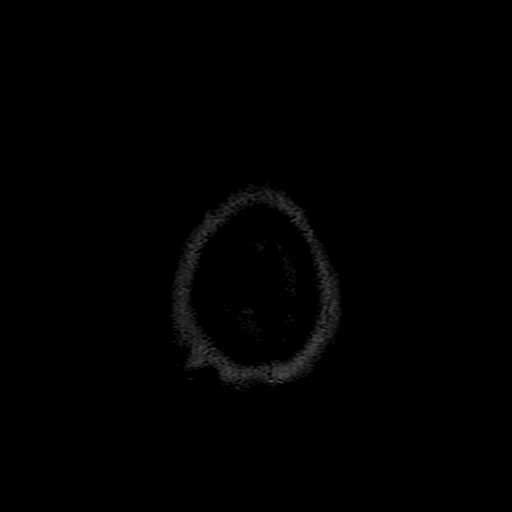

[Series 9: T1 · axial · 3.0mm · 0.47mm/px · z∈[-68,-53]mm · 2 of 92 slices shown (2 of 2)]
[im 1/92]
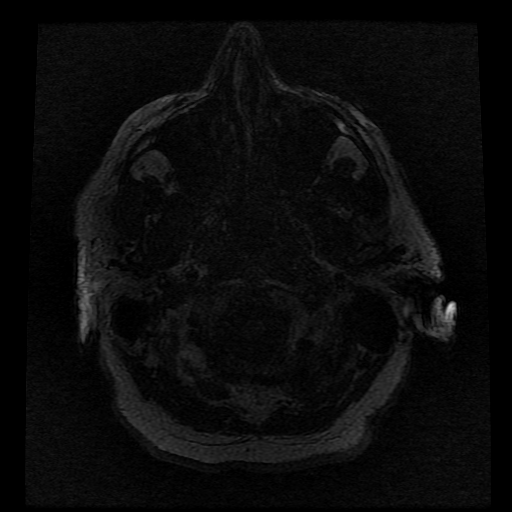
[im 11/92]
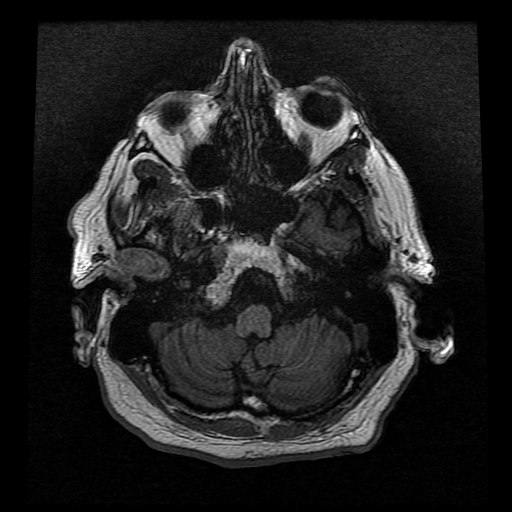

[Series 10: T2 · coronal · 5.0mm · 0.43mm/px · 3 of 29 slices shown (2 of 2)]
[im 1/29]
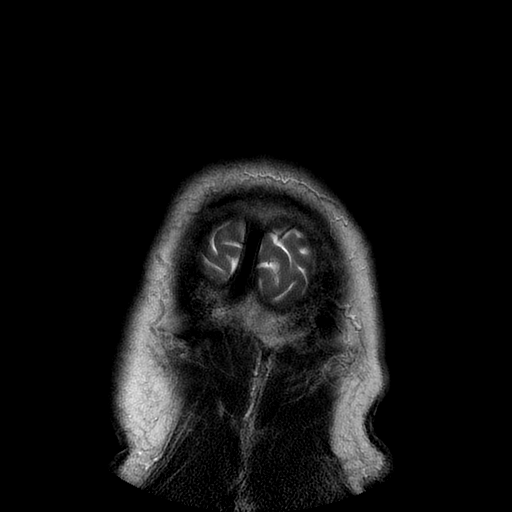
[im 15/29]
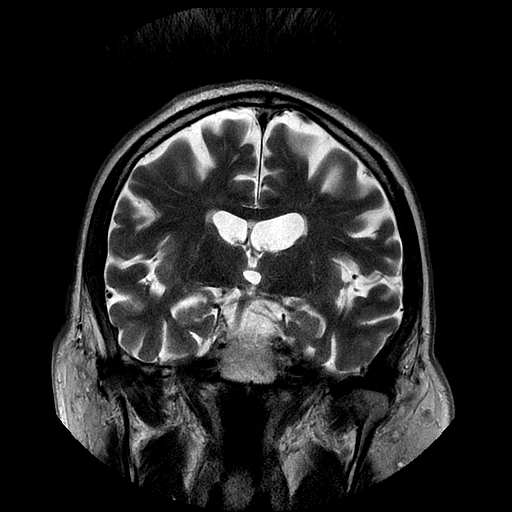
[im 29/29]
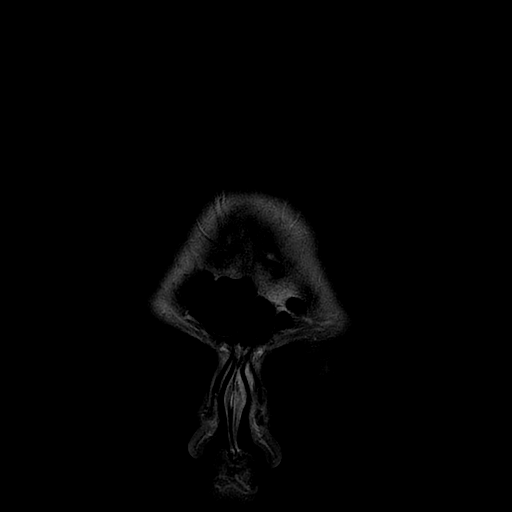

[Series 300: DWI · axial · 3.0mm · 1.09mm/px · z∈[-56,+72]mm · 5 of 44 slices shown (3 of 4)]
[im 1/44]
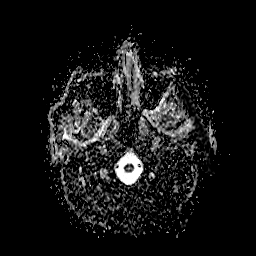
[im 11/44]
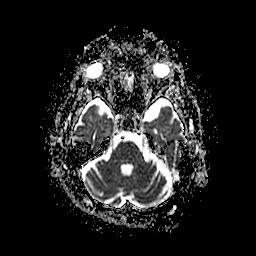
[im 22/44]
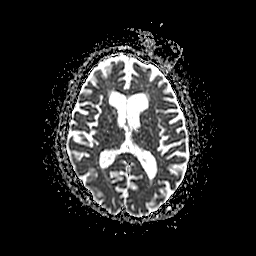
[im 33/44]
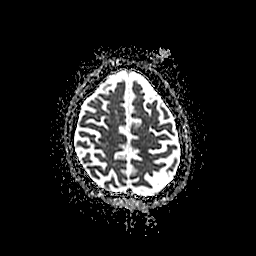
[im 44/44]
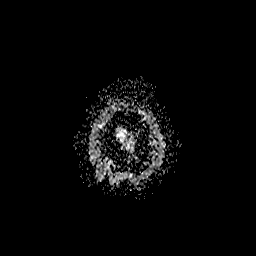

[Series 400: DWI · coronal · 5.0mm · 1.09mm/px · 4 of 34 slices shown (4 of 4)]
[im 1/34]
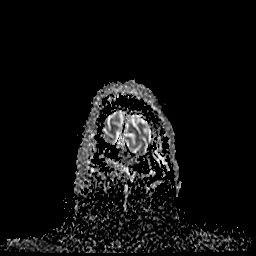
[im 12/34]
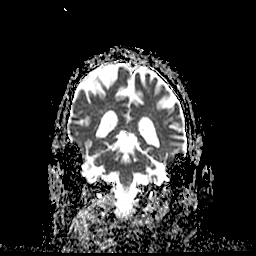
[im 23/34]
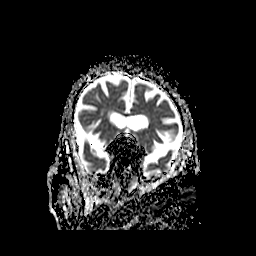
[im 34/34]
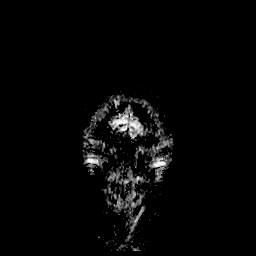

[38 of 48 positions shown; findings below may reference images not displayed]

FINDINGS: Brain: No restricted diffusion to suggest acute or subacute infarct.
No acute hemorrhage, mass, mass effect, or midline shift. No
hemosiderin deposition to suggest remote hemorrhage or superficial
siderosis. No hydrocephalus or extra-axial collection. Degree of
cerebral volume loss is within normal limits for age and without
lobar predominance. Scattered T2 hyperintense signal in the
periventricular white matter, likely the sequela of mild chronic
small vessel ischemic disease.

Vascular: Normal flow voids.

Skull and upper cervical spine: Normal marrow signal.

Sinuses/Orbits: No acute finding.

Other: The mastoids are well aerated.
IMPRESSION: No acute intracranial process. Age-appropriate cerebral volume loss
without lobar predominance.

## 2022-05-10 DIAGNOSIS — Z85828 Personal history of other malignant neoplasm of skin: Secondary | ICD-10-CM | POA: Diagnosis not present

## 2022-05-10 DIAGNOSIS — L570 Actinic keratosis: Secondary | ICD-10-CM | POA: Diagnosis not present

## 2022-05-10 DIAGNOSIS — D2261 Melanocytic nevi of right upper limb, including shoulder: Secondary | ICD-10-CM | POA: Diagnosis not present

## 2022-05-10 DIAGNOSIS — L538 Other specified erythematous conditions: Secondary | ICD-10-CM | POA: Diagnosis not present

## 2022-05-10 DIAGNOSIS — D2262 Melanocytic nevi of left upper limb, including shoulder: Secondary | ICD-10-CM | POA: Diagnosis not present

## 2022-05-10 DIAGNOSIS — D225 Melanocytic nevi of trunk: Secondary | ICD-10-CM | POA: Diagnosis not present

## 2022-05-10 DIAGNOSIS — X32XXXA Exposure to sunlight, initial encounter: Secondary | ICD-10-CM | POA: Diagnosis not present

## 2022-05-10 DIAGNOSIS — L820 Inflamed seborrheic keratosis: Secondary | ICD-10-CM | POA: Diagnosis not present

## 2022-05-11 ENCOUNTER — Other Ambulatory Visit: Payer: Self-pay

## 2022-05-11 DIAGNOSIS — I428 Other cardiomyopathies: Secondary | ICD-10-CM

## 2022-05-11 NOTE — Telephone Encounter (Signed)
Prescription refill request for Xarelto received.  Indication: Afib  Last office visit: 03/29/22 Caryl Comes)  Weight: 93.4kg Age: 82 Scr: 0.79 (02/08/22)  CrCl: 93.56m/min  Xarelto '20mg'$  is appropriate dose.   Received refill request via fax.  [facility]ing Pharmacy:  [facility] Phone: [phone]: [phone] Pt will need signed written prescription for refill to be sent to requested pharmacy.

## 2022-05-13 NOTE — Telephone Encounter (Signed)
Request responded too by other means.

## 2022-05-21 ENCOUNTER — Other Ambulatory Visit: Payer: Self-pay | Admitting: Family Medicine

## 2022-05-21 DIAGNOSIS — I2510 Atherosclerotic heart disease of native coronary artery without angina pectoris: Secondary | ICD-10-CM

## 2022-05-21 DIAGNOSIS — E785 Hyperlipidemia, unspecified: Secondary | ICD-10-CM

## 2022-05-23 ENCOUNTER — Other Ambulatory Visit: Payer: Self-pay

## 2022-05-23 DIAGNOSIS — I2510 Atherosclerotic heart disease of native coronary artery without angina pectoris: Secondary | ICD-10-CM

## 2022-05-23 DIAGNOSIS — E785 Hyperlipidemia, unspecified: Secondary | ICD-10-CM

## 2022-05-23 MED ORDER — ROSUVASTATIN CALCIUM 40 MG PO TABS: 40.0000 mg | ORAL_TABLET | Freq: Every day | ORAL | 1 refills | Status: DC

## 2022-05-30 ENCOUNTER — Other Ambulatory Visit: Payer: Self-pay

## 2022-05-30 ENCOUNTER — Other Ambulatory Visit: Payer: Self-pay | Admitting: Family Medicine

## 2022-05-30 DIAGNOSIS — I2510 Atherosclerotic heart disease of native coronary artery without angina pectoris: Secondary | ICD-10-CM

## 2022-05-30 DIAGNOSIS — E785 Hyperlipidemia, unspecified: Secondary | ICD-10-CM

## 2022-05-30 MED ORDER — LISINOPRIL 2.5 MG PO TABS: 2.5000 mg | ORAL_TABLET | Freq: Every day | ORAL | 3 refills | Status: DC

## 2022-05-31 ENCOUNTER — Ambulatory Visit: Payer: PPO | Attending: Internal Medicine

## 2022-05-31 DIAGNOSIS — I428 Other cardiomyopathies: Secondary | ICD-10-CM

## 2022-05-31 LAB — CUP PACEART REMOTE DEVICE CHECK
Battery Remaining Longevity: 49 mo
Battery Voltage: 2.98 V
Brady Statistic AP VP Percent: 0 %
Brady Statistic AP VS Percent: 0 %
Brady Statistic AS VP Percent: 12.05 %
Brady Statistic AS VS Percent: 87.95 %
Brady Statistic RA Percent Paced: 0 %
Brady Statistic RV Percent Paced: 16.5 %
Date Time Interrogation Session: 20240123044224
HighPow Impedance: 68 Ohm
Implantable Lead Connection Status: 753985
Implantable Lead Connection Status: 753985
Implantable Lead Implant Date: 20130502
Implantable Lead Implant Date: 20130502
Implantable Lead Location: 753859
Implantable Lead Location: 753860
Implantable Lead Model: 5076
Implantable Lead Model: 6935
Implantable Pulse Generator Implant Date: 20181031
Lead Channel Impedance Value: 342 Ohm
Lead Channel Impedance Value: 456 Ohm
Lead Channel Impedance Value: 494 Ohm
Lead Channel Pacing Threshold Amplitude: 0.625 V
Lead Channel Pacing Threshold Pulse Width: 0.4 ms
Lead Channel Sensing Intrinsic Amplitude: 3.125 mV
Lead Channel Sensing Intrinsic Amplitude: 3.125 mV
Lead Channel Setting Pacing Amplitude: 2.5 V
Lead Channel Setting Pacing Pulse Width: 0.4 ms
Lead Channel Setting Sensing Sensitivity: 0.6 mV
Zone Setting Status: 755011
Zone Setting Status: 755011

## 2022-06-14 ENCOUNTER — Ambulatory Visit: Payer: PPO

## 2022-06-28 NOTE — Progress Notes (Signed)
Remote ICD transmission.   

## 2022-07-07 ENCOUNTER — Telehealth: Payer: Self-pay | Admitting: Family Medicine

## 2022-07-07 ENCOUNTER — Ambulatory Visit (INDEPENDENT_AMBULATORY_CARE_PROVIDER_SITE_OTHER): Payer: PPO

## 2022-07-07 ENCOUNTER — Telehealth: Payer: Self-pay | Admitting: Internal Medicine

## 2022-07-07 VITALS — BP 100/62 | HR 46 | Ht 66.5 in | Wt 201.9 lb

## 2022-07-07 DIAGNOSIS — Z0000 Encounter for general adult medical examination without abnormal findings: Secondary | ICD-10-CM

## 2022-07-07 NOTE — Progress Notes (Addendum)
Subjective:   Chris Price is a 82 y.o. male who presents for Medicare Annual/Subsequent preventive examination.  Review of Systems         Objective:    Today's Vitals   07/07/22 1048 07/07/22 1053  Weight: 201 lb 14.4 oz (91.6 kg)   Height: 5' 6.5" (1.689 m)   PainSc:  2    Body mass index is 32.1 kg/m.     07/07/2022   11:07 AM 03/03/2022    1:11 PM 06/10/2021    8:29 AM 05/21/2019   10:09 AM 05/15/2018   11:06 AM 05/12/2017    2:24 PM 03/08/2017   10:09 AM  Advanced Directives  Does Patient Have a Medical Advance Directive? Yes Yes Yes Yes Yes Yes Yes  Type of Advance Directive  Living will [place];Living will [place];Living will Living will;Healthcare Power of [place];Living will [place];Living will  Does patient want to make changes to medical advance directive?     No - Patient declined No - Patient declined No - Patient declined  Copy of [place] in Chart?   Yes - validated most recent copy scanned in chart (See row information) Yes - validated most recent copy scanned in chart (See row information) Yes - validated most recent copy scanned in chart (See row information) Yes Yes    Current Medications (verified) Outpatient Encounter Medications as of 07/07/2022  Medication Sig   B Complex-Folic Acid (SUPER B COMPLEX MAXI) TABS Take 1 tablet by mouth daily.   bisoprolol (ZEBETA) 5 MG tablet Take 1 tablet (5 mg) by mouth twice a day   digoxin (LANOXIN) 0.25 MG tablet Take 1.5 tablets (0.375 mg) by mouth once daily   empagliflozin (JARDIANCE) 10 MG TABS tablet Take 1 tablet (10 mg total) by mouth daily.   ezetimibe (ZETIA) 10 MG tablet Take 1 tablet (10 mg total) by mouth daily.   finasteride (PROSCAR) 5 MG tablet TAKE ONE TABLET BY MOUTH EVERY DAY   furosemide (LASIX) 20 MG tablet Take 1 tablet (20 mg) by mouth EVERY OTHER day   gabapentin (NEURONTIN) 400 MG  capsule Take 400 mg by mouth 2 (two) times daily.   hydrochlorothiazide (MICROZIDE) 12.5 MG capsule Take 1 tablet (12.5 mg) by mouth once daily in the morning   lidocaine (LIDODERM) 5 % Place 2 patches onto the skin daily. Remove & Discard patch within 12 hours or as directed by MD   lisinopril (ZESTRIL) 2.5 MG tablet Take 1 tablet (2.5 mg total) by mouth daily.   loratadine (CLARITIN) 10 MG tablet TAKE ONE TABLET BY MOUTH DAILY AS NEEDED FOR ALLERGIES   multivitamin (ONE-A-DAY MEN'S) TABS tablet Take 1 tablet by mouth daily.   nitroGLYCERIN (NITROSTAT) 0.4 MG SL tablet Place 1 tablet (0.4 mg total) under the tongue every 5 (five) minutes as needed for chest pain.   omeprazole (PRILOSEC) 40 MG capsule Take 1 capsule (40 mg total) by mouth daily before breakfast. (Patient taking differently: Take 40 mg by mouth daily as needed.)   QUEtiapine (SEROQUEL) 25 MG tablet TAKE 1 TABLET BY MOUTH AT BEDTIME   rivaroxaban (XARELTO) 20 MG TABS tablet TAKE ONE TABLET BY MOUTH DAILY WITH SUPPER   rosuvastatin (CRESTOR) 40 MG tablet Take 1 tablet (40 mg total) by mouth daily.   tizanidine (ZANAFLEX) 2 MG capsule Take 1 capsule (2 mg total) by mouth 3 (three) times daily.   triamcinolone ointment (  KENALOG) 0.1 % Apply 1 application topically daily as needed.   vitamin C (ASCORBIC ACID) 500 MG tablet Take 500 mg by mouth daily.   No facility-administered encounter medications on file as of 07/07/2022.    Allergies (verified) Coreg [carvedilol] and Flomax [tamsulosin hcl]   History: Past Medical History:  Diagnosis Date   Chronic HFrEF (heart failure with reduced ejection fraction) ([place])    a. 08/2020 Echo: EF 30-35%, glob HK, mild LVH. Low nl RV fxn. Sev BAE. Mild MR. Mild-mod TR. Mild AI. Ao sclerosis.   Degenerative disk disease    lumbar   Disc herniation    GERD (gastroesophageal reflux disease)    Hyperlipidemia    Hypertension    ICD (implantable cardiac defibrillator) in place    a. 02/2017 gen  change to MDT MRI compatible single lead device. Ser # [id number].   IVCD (intraventricular conduction defect)    Nonspecific QRS duration 122   Lumbar spinal stenosis    Neuromuscular disorder (HCC)    neauropathy   / siatica   NICM (nonischemic cardiomyopathy) ([place])    a. 07/2011 Echo: EF 25-30%-->s/p ICD; b. 02/2017 s/p ICD gen change; c. 08/2020 Echo: EF 30-35%.   Non-obstructive Coronary artery disease 08/15/2011   a. 08/2011 Cath: Moderate nonobstructive CAD; b. 07/2013 Cath: LM nl, LAD 105m D1 50/767mLCX 5098mCA 30p, 50d-->Med rx; c. 08/2020 MV: EF 35%. No ischemia. Fixed apical defect.   [facility]  Sleep apnea    uses cpap   Past Surgical History:  Procedure Laterality Date   CARDIAC CATHE[facility]IZATION  [facility]    CARDIAC CATHETERIZATION  07/09/2013   [facility]   CARDIOVERSION  08/2011   [facility]   CHOLECYSTECTOMY  April 2015   COLONOSCOPY WITH PROPOFOL N/A 07/30/2015   Procedure: COLONOSCOPY WITH PROPOFOL;  Surgeon: MarLollie SailsD;  [facility];  Service: Endoscopy;  Laterality: N/A;   cooled thermotherapy  [id number], complicated by incontinence   HERNIA REPAIR     ICD  09/08/2011   ICD GENERATOR CHANGEOUT N/A 03/08/2017   Procedure: ICD GENERATOR CHANGEOUT;  Surgeon: KleDeboraha SprangD;  Location: [facility];  Service: Cardiovascular;  Laterality: N/A;   IMPLANTABLE CARDIOVERTER DEFIBRILLATOR IMPLANT  09/08/2011   Procedure: IMPLANTABLE CARDIOVERTER DEFIBRILLATOR IMPLANT;  Surgeon: SteDeboraha SprangD;  [facility];  Service: Cardiovascular;;   LUMBAR LAMINECTOMY     L2 through S1 with wide decompression of the thecal sac and nerve roots.   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   Smoking status: Never   Smokeless tobacco: Never  VapScientific laboratory techniciane: Never used  Substance and Sexual Activity   Alcohol use: No   Drug use: No   Sexual activity: Not Currently  Other Topics Concern   Not on file  Social History Narrative   Second marriage, together since 1980  He has 3 children from previous marriage and one step-daughter    Retired    Right handed   Social Determinants of Health   Financial Resource Strain: [place]  (07/07/2022)   Overall Financial Resource Strain (CARDIA)    Difficulty of Paying Living Expenses: Not very hard  Food Insecurity: No Food Insecurity (07/07/2022)   Hunger Vital Sign    Worried About Running Out of Food in the Last Year: Never true    Ran Out of Food in the Last Year: Never true  Transportation Needs: No Transportation Needs (07/07/2022)   PRAPARE - Hydrologist (Medical): No    Lack of Transportation (Non-Medical): No  Physical Activity: Inactive (07/07/2022)   Exercise Vital Sign    Days of Exercise per Week: 0 days    Minutes of Exercise per Session: 0 min  Stress: No Stress Concern Present (07/07/2022)   [place]    Feeling of Stress : Not at all  Social Connections: Moderately Isolated (07/07/2022)   Social Connection and Isolation Panel [NHANES]    Frequency of Communication with Friends and Family: More than three times a week    Frequency of Social Gatherings with Friends and Family: Three times a week    Attends Religious Services: Never    Active Member of Clubs or Organizations: No    Attends Music therapist: Never    Marital Status: Married    Tobacco Counseling Counseling given: Not Answered   Clinical  Intake:  Pre-visit preparation completed: Yes  Pain : 0-10 Pain Score: 2  Pain Type: Chronic pain Pain Location: Back Pain Orientation: Lower Pain Descriptors / Indicators: Aching, Spasm Pain Onset: More than a month ago Pain Frequency: Intermittent Pain Relieving Factors: chiro,tylenol  Pain Relieving Factors: chiro,tylenol  BMI - recorded: 32.1 Nutritional Status: BMI > 30  Obese Nutritional Risks: None Diabetes: No  How often do you need to have someone help you when you read instructions, pamphlets, or other written materials from your doctor or pharmacy?: 1 - Never  Diabetic?no  Interpreter Needed?: No  Information entered by :: B.Elishua Radford,LPN   Activities of Daily Living    07/07/2022   11:07 AM 02/08/2022    8:18 AM  In your present state of health, do you have any difficulty performing the following activities:  Hearing? 0 0  Vision? 0 0  Difficulty concentrating or making decisions? 0 1  Walking or climbing stairs? 0 0  Dressing or bathing? 0 0  Doing errands, shopping? 0 0  Preparing Food and eating ? N   Using the Toilet? N   In the past six months, have you accidently leaked urine? N   Do you have problems with loss of bowel control? N   Managing your Medications? N   Managing your Finances? N   Housekeeping or managing your Housekeeping? N     Patient Care Team: [name], MD as PCP - General (Family Medicine) [name], MD as PCP - Cardiology (Cardiology) [name], MD as Consulting Physician (Cardiology) [name], MD as Consulting Physician (Urology) [name], [facility] (Pharmacist) Tat, Eustace Quail, DO as Consulting Physician (Neurology)  Indicate any recent Medical Services you may have received from other than [facility] providers in the past year (date may be approximate).     Assessment:   This is a routine wellness examination for Meht[place].  Hearing/Vision screen Hearing Screening -  Comments:: Adequate hearing  with hearing aides Vision Screening - Comments:: Adequate w/glasses Cammy [place]  Dietary issues and exercise activities discussed: Current Exercise Habits: The patient does not participate in regular exercise at present   Goals Addressed             This Visit's Progress    DIET - [place]   On track    Increase physical activity   Not on track    Stretch!! Chair exercises as demonstrated, increase as tolerated Silver sneaker water aerobics 3 times weekly, 45-60 minutes      Track and Manage Fluids and Swelling-Heart Failure   On track    Timeframe:  Long-Range Goal Priority:  High Start Date: 03/08/2021                            Expected End Date: 03/08/2022                      Follow Up within 90 days   - call office if I gain more than 2 pounds in one day or 5 pounds in one week - keep legs up while sitting - use salt in moderation - weigh myself daily    Why is this important?   It is important to check your weight daily and watch how much salt and liquids you have.  It will help you to manage your heart failure.    Notes:        Depression Screen    07/07/2022   11:05 AM 02/08/2022    8:18 AM 01/11/2022    3:29 PM 12/21/2021    2:26 PM 09/08/2021    3:28 PM 08/09/2021   10:36 AM 06/10/2021    8:28 AM  PHQ 2/9 Scores  PHQ - 2 Score 0 0 0 0 0 0 0  PHQ- 9 Score  0 0 0 0 0 0    Fall Risk    07/07/2022   10:58 AM 03/03/2022    1:11 PM 02/08/2022    8:18 AM 01/11/2022    3:29 PM 12/21/2021    2:25 PM  Fall Risk   Falls in the past year? 0 0 1 1 0  Number falls in past yr: 0  0 0 0  Injury with Fall? 0  0 0 0  Risk for fall due to : No Fall Risks  No Fall Risks  Impaired balance/gait;Impaired mobility;Orthopedic patient  Follow up Education provided;Falls prevention discussed Falls evaluation completed Falls prevention discussed  Education provided;Falls evaluation completed;Falls prevention discussed    FALL RISK PREVENTION PERTAINING TO THE  HOME:  Any stairs in or around the home? Yes  If so, are there any without handrails? Yes  Home free of loose throw rugs in walkways, pet beds, electrical cords, etc? Yes  Adequate lighting in your home to reduce risk of falls? Yes   ASSISTIVE DEVICES UTILIZED TO PREVENT FALLS:  Life alert? No  Use of a cane, walker or w/c? No  Grab bars in the bathroom? Yes  Shower chair or bench in shower? Yes  Elevated toilet seat or a handicapped toilet? No   TIMED UP AND GO:  Was the test performed? Yes .  Length of time to ambulate 10 feet: 13  sec.   Gait slow and steady without use of assistive device  Cognitive Function:    06/07/2021    9:33 AM 05/12/2016  2:27 PM 05/13/2015    3:08 PM  MMSE - Mini Mental State Exam  Orientation to time '3 5 5  '$ Orientation to Place '4 5 5  '$ Registration '3 3 3  '$ Attention/ Calculation '5 5 5  '$ Recall 0 3 3  Language- name 2 objects '2 2 2  '$ Language- repeat '1 1 1  '$ Language- follow 3 step command '3 3 3  '$ Language- read & follow direction '1 1 1  '$ Write a sentence '1 1 1  '$ Copy design 0 1 1  Total score '23 30 30        '$ 07/07/2022   11:10 AM 06/09/2020    8:15 AM 05/21/2019   10:13 AM 05/15/2018   11:15 AM 05/12/2017    3:00 PM  6CIT Screen  What Year? 0 points 0 points 0 points 0 points 0 points  What month? 0 points 0 points 0 points 0 points 0 points  What time? 0 points 0 points 0 points 0 points 0 points  Count back from 20 0 points 0 points 0 points 0 points 0 points  Months in reverse 0 points 0 points 0 points 2 points 0 points  Repeat phrase 4 points 6 points 4 points 2 points 0 points  Total Score 4 points 6 points 4 points 4 points 0 points    Immunizations Immunization History  Administered Date(s) Administered   Fluad Quad(high Dose 65+) 01/25/2019, 01/31/2020, 02/08/2021, 02/08/2022   Influenza Split 02/19/2012   Influenza-Unspecified 02/08/2013, 02/09/2016, 02/20/2017, 02/02/2018   PFIZER(Purple Top)SARS-COV-2 Vaccination 06/03/2019,  06/24/2019, 02/14/2020, 08/07/2020, 02/25/2021   Pneumococcal Conjugate-13 05/14/2015   Pneumococcal Polysaccharide-23 03/21/2012   Tdap 02/28/2011, 03/28/2011   Zoster Recombinat (Shingrix) 10/29/2019, 12/31/2019    TDAP status: Up to date  Flu Vaccine status: Up to date  Pneumococcal vaccine status: Up to date  Covid-19 vaccine status: Completed vaccines  Qualifies for Shingles Vaccine? Yes   Zostavax completed Yes   Shingrix Completed?: Yes  Screening Tests Health Maintenance  Topic Date Due   DTaP/Tdap/Td (3 - Td or Tdap) 03/27/2021   COVID-19 Vaccine (6 - 2023-24 season) 01/07/2022   Medicare Annual Wellness (AWV)  07/07/2023   Pneumonia Vaccine 35+ Years old  Completed   INFLUENZA VACCINE  Completed   Zoster Vaccines- Shingrix  Completed   HPV VACCINES  Aged Out    Health Maintenance  Health Maintenance Due  Topic Date Due   DTaP/Tdap/Td (3 - Td or Tdap) 03/27/2021   COVID-19 Vaccine (6 - 2023-24 season) 01/07/2022    Colorectal cancer screening: No longer required.   Lung Cancer Screening: (Low Dose CT Chest recommended if Age 33-80 years, 30 pack-year currently smoking OR have quit w/in 15years.) does not qualify.   Lung Cancer Screening Referral: no  Additional Screening:  Hepatitis C Screening: does not qualify; Completed no  Vision Screening: Recommended annual ophthalmology exams for early detection of glaucoma and other disorders of the eye. Is the patient up to date with their annual eye exam?  Yes  Who is the provider or what is the name of the office in which the patient attends annual eye exams? [place] If pt is not established with a provider, would they like to be referred to a provider to establish care? No .   Dental Screening: Recommended annual dental exams for proper oral hygiene  Community Resource Referral / Chronic Care Management: CRR required this visit?  No   CCM required this visit?  No    Plan:  I have personally  reviewed and noted the following in the patient's chart:   Medical and social history Use of alcohol, tobacco or illicit drugs  Current medications and supplements including opioid prescriptions. Patient is not currently taking opioid prescriptions. Functional ability and status Nutritional status Physical activity Advanced directives List of other physicians Hospitalizations, surgeries, and ER visits in previous 12 months Vitals Screenings to include cognitive, depression, and falls Referrals and appointments  In addition, I have reviewed and discussed with patient certain preventive protocols, quality metrics, and best practice recommendations. A written personalized care plan for preventive services as well as general preventive health recommendations were provided to patient.     Roger Shelter, LPN   [id number]   Nurse Notes: pt is doing well at his baseline managing chronic pain. Pt indicates his pulse this morning was in the 40's this morning and BP a little low.  *Pt pulse remains in 40's at the visit. Pt states he feels fine just gets a little winded when walking sometimes. I asked pt/his wife to notify their cardiologist of low heart rate.*  No other concerns or questions at this visit.  *I called pt cellphone and left msg on his personal vm (pt identified) that Dr [name] is on vacation and to assure that he reaches out to his cardiologist [phone] rate. If he experiences any emergent symptoms to please seek out ER/Urgent Care.*

## 2022-07-07 NOTE — Telephone Encounter (Signed)
STAT if HR is under 50 or over 120 (normal HR is 60-100 beats per minute)  What is your heart rate?  129/65 81 currently  Do you have a log of your heart rate readings (document readings)?  Patient's wife states this morning during wellness check with PCP's office HR was at 46 and they waited a while and checked again and it was at 48--that was the highest. She states his current HR is 81.  Do you have any other symptoms?  Patient has been more tired recently

## 2022-07-07 NOTE — Telephone Encounter (Signed)
*  STAT* If patient is at the pharmacy, call can be transferred to refill team.   1. Which medications need to be refilled? (please list name of each medication and dose if known)  empagliflozin (JARDIANCE) 10 MG TABS tablet  2. Which pharmacy/location (including street and city if local pharmacy) is medication to be sent to? [place], [place]  3. Do they need a 30 day or 90 day supply?  90 day supply

## 2022-07-07 NOTE — Telephone Encounter (Signed)
Refill request - Empagliflozin (Jardiance) '10MG'$ , Approved #[id number] refill with no additional refill until office visit

## 2022-07-07 NOTE — Patient Instructions (Addendum)
[name] , Thank you for taking time to come for your Medicare Wellness Visit. I appreciate your ongoing commitment to your health goals. Please review the following plan we discussed and let me know if I can assist you in the future.   These are the goals we discussed:  Goals      DIET - INCREASE WATER INTAKE     Increase physical activity     Stretch!! Chair exercises as demonstrated, increase as tolerated Silver sneaker water aerobics 3 times weekly, 45-60 minutes      Track and Manage Fluids and Swelling-Heart Failure     Timeframe:  Long-Range Goal Priority:  High Start Date: 03/08/2021                            Expected End Date: 03/08/2022                      Follow Up within 90 days   - call office if I gain more than 2 pounds in one day or 5 pounds in one week - keep legs up while sitting - use salt in moderation - weigh myself daily    Why is this important?   It is important to check your weight daily and watch how much salt and liquids you have.  It will help you to manage your heart failure.    Notes:         This is a list of the screening recommended for you and due dates:  Health Maintenance  Topic Date Due   DTaP/Tdap/Td vaccine (3 - Td or Tdap) 03/27/2021   COVID-19 Vaccine (6 - 2023-24 season) 01/07/2022   Medicare Annual Wellness Visit  07/07/2023   Pneumonia Vaccine  Completed   Flu Shot  Completed   Zoster (Shingles) Vaccine  Completed   HPV Vaccine  Aged Out    Advanced directives: yes  Conditions/risks identified: low fall risk  NHA ONLY available on Thursday or Friday for CCM.   Next appointment: Follow up in one year for your annual wellness visit. 07/13/2023 '@10'$ :15 in person    Preventive Care 29 Years and Older, Male  Preventive care refers to lifestyle choices and visits with your health care provider that can promote health and wellness. What does preventive care include? A yearly physical exam. This is also called an annual  well check. Dental exams once or twice a year. Routine eye exams. Ask your health care provider how often you should have your eyes checked. Personal lifestyle choices, including: Daily care of your teeth and gums. Regular physical activity. Eating a healthy diet. Avoiding tobacco and drug use. Limiting alcohol use. Practicing safe sex. Taking low doses of aspirin every day. Taking vitamin and mineral supplements as recommended by your health care provider. What happens during an annual well check? The services and screenings done by your health care provider during your annual well check will depend on your age, overall health, lifestyle risk factors, and family history of disease. Counseling  Your health care provider may ask you questions about your: Alcohol use. Tobacco use. Drug use. Emotional well-being. Home and relationship well-being. Sexual activity. Eating habits. History of falls. Memory and ability to understand (cognition). Work and work Statistician. Screening  You may have the following tests or measurements: Height, weight, and BMI. Blood pressure. Lipid and cholesterol levels. These may be checked every 5 years, or more frequently if you  are over 28 years old. Skin check. Lung cancer screening. You may have this screening every year starting at age 28 if you have a 30-pack-year history of smoking and currently smoke or have quit within the past 15 years. Fecal occult blood test (FOBT) of the stool. You may have this test every year starting at age 22. Flexible sigmoidoscopy or colonoscopy. You may have a sigmoidoscopy every 5 years or a colonoscopy every 10 years starting at age 13. Prostate cancer screening. Recommendations will vary depending on your family history and other risks. Hepatitis C blood test. Hepatitis B blood test. Sexually transmitted disease (STD) testing. Diabetes screening. This is done by checking your blood sugar (glucose) after you have  not eaten for a while (fasting). You may have this done every 1-3 years. Abdominal aortic aneurysm (AAA) screening. You may need this if you are a current or former smoker. Osteoporosis. You may be screened starting at age 41 if you are at high risk. Talk with your health care provider about your test results, treatment options, and if necessary, the need for more tests. Vaccines  Your health care provider may recommend certain vaccines, such as: Influenza vaccine. This is recommended every year. Tetanus, diphtheria, and acellular pertussis (Tdap, Td) vaccine. You may need a Td booster every 10 years. Zoster vaccine. You may need this after age 43. Pneumococcal 13-valent conjugate (PCV13) vaccine. One dose is recommended after age 50. Pneumococcal polysaccharide (PPSV23) vaccine. One dose is recommended after age 41. Talk to your health care provider about which screenings and vaccines you need and how often you need them. This information is not intended to replace advice given to you by your health care provider. Make sure you discuss any questions you have with your health care provider. Document Released: 05/22/2015 Document Revised: 01/13/2016 Document Reviewed: 02/24/2015 Elsevier Interactive Patient Education  2017 [place] Prevention in the Home Falls can cause injuries. They can happen to people of all ages. There are many things you can do to make your home safe and to help prevent falls. What can I do on the outside of my home? Regularly fix the edges of walkways and driveways and fix any cracks. Remove anything that might make you trip as you walk through a door, such as a raised step or threshold. Trim any bushes or trees on the path to your home. Use bright outdoor lighting. Clear any walking paths of anything that might make someone trip, such as rocks or tools. Regularly check to see if handrails are loose or broken. Make sure that both sides of any steps have  handrails. Any raised decks and porches should have guardrails on the edges. Have any leaves, snow, or ice cleared regularly. Use sand or salt on walking paths during winter. Clean up any spills in your garage right away. This includes oil or grease spills. What can I do in the bathroom? Use night lights. Install grab bars by the toilet and in the tub and shower. Do not use towel bars as grab bars. Use non-skid mats or decals in the tub or shower. If you need to sit down in the shower, use a plastic, non-slip stool. Keep the floor dry. Clean up any water that spills on the floor as soon as it happens. Remove soap buildup in the tub or shower regularly. Attach bath mats securely with double-sided non-slip rug tape. Do not have throw rugs and other things on the floor that can make you trip.  What can I do in the bedroom? Use night lights. Make sure that you have a light by your bed that is easy to reach. Do not use any sheets or blankets that are too big for your bed. They should not hang down onto the floor. Have a firm chair that has side arms. You can use this for support while you get dressed. Do not have throw rugs and other things on the floor that can make you trip. What can I do in the kitchen? Clean up any spills right away. Avoid walking on wet floors. Keep items that you use a lot in easy-to-reach places. If you need to reach something above you, use a strong step stool that has a grab bar. Keep electrical cords out of the way. Do not use floor polish or wax that makes floors slippery. If you must use wax, use non-skid floor wax. Do not have throw rugs and other things on the floor that can make you trip. What can I do with my stairs? Do not leave any items on the stairs. Make sure that there are handrails on both sides of the stairs and use them. Fix handrails that are broken or loose. Make sure that handrails are as long as the stairways. Check any carpeting to make sure  that it is firmly attached to the stairs. Fix any carpet that is loose or worn. Avoid having throw rugs at the top or bottom of the stairs. If you do have throw rugs, attach them to the floor with carpet tape. Make sure that you have a light switch at the top of the stairs and the bottom of the stairs. If you do not have them, ask someone to add them for you. What else can I do to help prevent falls? Wear shoes that: Do not have high heels. Have rubber bottoms. Are comfortable and fit you well. Are closed at the toe. Do not wear sandals. If you use a stepladder: Make sure that it is fully opened. Do not climb a closed stepladder. Make sure that both sides of the stepladder are locked into place. Ask someone to hold it for you, if possible. Clearly mark and make sure that you can see: Any grab bars or handrails. First and last steps. Where the edge of each step is. Use tools that help you move around (mobility aids) if they are needed. These include: Canes. Walkers. Scooters. Crutches. Turn on the lights when you go into a dark area. Replace any light bulbs as soon as they burn out. Set up your furniture so you have a clear path. Avoid moving your furniture around. If any of your floors are uneven, fix them. If there are any pets around you, be aware of where they are. Review your medicines with your doctor. Some medicines can make you feel dizzy. This can increase your chance of falling. Ask your doctor what other things that you can do to help prevent falls. This information is not intended to replace advice given to you by your health care provider. Make sure you discuss any questions you have with your health care provider. Document Released: 02/19/2009 Document Revised: 10/01/2015 Document Reviewed: 05/30/2014 Elsevier Interactive Patient Education  2017 [employer].

## 2022-07-07 NOTE — Telephone Encounter (Signed)
Spoke to patient and spouse who deny any SOB or chest pain, just "more fatigue" lately. Asked them to send device transmission. Forwarded to Dr. Caryl Comes and his team.

## 2022-07-07 NOTE — Telephone Encounter (Signed)
Patient received a call from the office and wasn't able to answer the phone. Patient wants to know what the call was regarding and if someone could call back. Please advise.

## 2022-07-11 NOTE — Telephone Encounter (Signed)
Transmission received 07-07-2022 at 6:48 pm.

## 2022-07-12 NOTE — Addendum Note (Signed)
Addended by: Diamond Nickel on: 07/12/2022 06:54 PM   Modules accepted: Orders

## 2022-07-12 NOTE — Telephone Encounter (Addendum)
Reviewed with Dr. Caryl Comes.  He orders the following:   D/C digoxin Decrease bisoprolol to 2.'5mg'$  twice daily.    LM on patient's machine to call us back and discuss changes.

## 2022-07-12 NOTE — Telephone Encounter (Signed)
Reviewed patient's transmission. Normal device function. Patient has persistent AF.  No episodes/no therapy.   Do note that patient spends a large percentage of time 40-50bpm range: approx 40%. (This has gone up since his November in clinic visit it was approx 25%).  Will discuss with Dr. Caryl Comes if this may be impacting his fatigue level.  Patient is programmed VVI LRL of 40bpm.

## 2022-07-13 NOTE — Telephone Encounter (Signed)
Spoke with Pt's wife.  Advised of med changes.  She requests these changes also be sent via MyChart for her.  Will continue to follow remotes.

## 2022-07-14 ENCOUNTER — Encounter: Payer: Self-pay | Admitting: Internal Medicine

## 2022-07-21 ENCOUNTER — Other Ambulatory Visit: Payer: Self-pay | Admitting: Family Medicine

## 2022-07-21 DIAGNOSIS — F5101 Primary insomnia: Secondary | ICD-10-CM

## 2022-07-25 DIAGNOSIS — I447 Left bundle-branch block, unspecified: Secondary | ICD-10-CM | POA: Insufficient documentation

## 2022-07-25 DIAGNOSIS — I472 Ventricular tachycardia, unspecified: Secondary | ICD-10-CM | POA: Insufficient documentation

## 2022-07-26 ENCOUNTER — Ambulatory Visit: Payer: PPO | Attending: Internal Medicine | Admitting: Internal Medicine

## 2022-07-26 ENCOUNTER — Encounter: Payer: Self-pay | Admitting: Internal Medicine

## 2022-07-26 VITALS — BP 118/74 | HR 65 | Ht 66.5 in | Wt 206.0 lb

## 2022-07-26 DIAGNOSIS — I472 Ventricular tachycardia, unspecified: Secondary | ICD-10-CM

## 2022-07-26 DIAGNOSIS — I5022 Chronic systolic (congestive) heart failure: Secondary | ICD-10-CM | POA: Diagnosis not present

## 2022-07-26 DIAGNOSIS — Z95810 Presence of automatic (implantable) cardiac defibrillator: Secondary | ICD-10-CM

## 2022-07-26 DIAGNOSIS — I447 Left bundle-branch block, unspecified: Secondary | ICD-10-CM | POA: Diagnosis not present

## 2022-07-26 DIAGNOSIS — I454 Nonspecific intraventricular block: Secondary | ICD-10-CM

## 2022-07-26 DIAGNOSIS — I4821 Permanent atrial fibrillation: Secondary | ICD-10-CM | POA: Diagnosis not present

## 2022-07-26 DIAGNOSIS — I428 Other cardiomyopathies: Secondary | ICD-10-CM | POA: Diagnosis not present

## 2022-07-26 LAB — CUP PACEART INCLINIC DEVICE CHECK
Battery Remaining Longevity: 56 mo
Battery Voltage: 2.97 V
Brady Statistic AP VP Percent: 0 %
Brady Statistic AP VS Percent: 0 %
Brady Statistic AS VP Percent: 13.11 %
Brady Statistic AS VS Percent: 86.89 %
Brady Statistic RA Percent Paced: 0 %
Brady Statistic RV Percent Paced: 15.54 %
Date Time Interrogation Session: 20240319171409
HighPow Impedance: 66 Ohm
Implantable Lead Connection Status: 753985
Implantable Lead Connection Status: 753985
Implantable Lead Implant Date: 20130502
Implantable Lead Implant Date: 20130502
Implantable Lead Location: 753859
Implantable Lead Location: 753860
Implantable Lead Model: 5076
Implantable Lead Model: 6935
Implantable Pulse Generator Implant Date: 20181031
Lead Channel Impedance Value: 437 Ohm
Lead Channel Impedance Value: 456 Ohm
Lead Channel Impedance Value: 570 Ohm
Lead Channel Pacing Threshold Amplitude: 0.75 V
Lead Channel Pacing Threshold Pulse Width: 0.4 ms
Lead Channel Sensing Intrinsic Amplitude: 3.375 mV
Lead Channel Sensing Intrinsic Amplitude: 3.375 mV
Lead Channel Setting Pacing Amplitude: 2.5 V
Lead Channel Setting Pacing Pulse Width: 0.4 ms
Lead Channel Setting Sensing Sensitivity: 0.6 mV
Zone Setting Status: 755011
Zone Setting Status: 755011

## 2022-07-26 MED ORDER — BISOPROLOL FUMARATE 5 MG PO TABS: 5.0000 mg | ORAL_TABLET | Freq: Every day | ORAL | 3 refills | Status: DC

## 2022-07-26 NOTE — Patient Instructions (Signed)
Medication Instructions:  Your physician has recommended you make the following change in your medication:   ** Resume Bisoprolol 5mg  - 1 tablet by mouth daily    *If you need a refill on your cardiac medications before your next appointment, please call your pharmacy*   Lab Work: None ordered.  If you have labs (blood work) drawn today and your tests are completely normal, you will receive your results only by: [place] (if you have MyChart) OR A paper copy in the mail If you have any lab test that is abnormal or we need to change your treatment, we will call you to review the results.   Testing/Procedures: Your physician has requested that you have an exercise tolerance test. For further information please visit [email or web address]. Please also follow instruction sheet, as given.    Follow-Up: At [facility], you and your health needs are our priority.  As part of our continuing mission to provide you with exceptional heart care, we have created designated Provider Care Teams.  These Care Teams include your primary Cardiologist (physician) and Advanced Practice Providers (APPs -  Physician Assistants and Nurse Practitioners) who all work together to provide you with the care you need, when you need it.  We recommend signing up for the patient portal called "MyChart".  Sign up information is provided on this After Visit Summary.  MyChart is used to connect with patients for Virtual Visits (Telemedicine).  Patients are able to view lab/test results, encounter notes, upcoming appointments, etc.  Non-urgent messages can be sent to your provider as well.   To learn more about what you can do with MyChart, go to [email or web address].    Your next appointment:   To be determined

## 2022-07-26 NOTE — Progress Notes (Signed)
Patient ID: Chris Price, male   DOB: [birth year], 82 y.o.   MRN: [id number]  Patient Care Team: [name], MD as PCP - General (Family Medicine) [name], MD as PCP - Cardiology (Cardiology) [name], MD as Consulting Physician (Cardiology) [name], MD as Consulting Physician (Urology) [name], [facility] (Pharmacist) Tat, Eustace Quail, DO as Consulting Physician (Neurology)   HPI  Chris Price is a 82 y.o. male Seen in followup for an ICD implanted (DOI 2013/ gen change 10/18)for primary prevention in nonischemic cardiomyopathy further complicated by sinus bradycardia   Atrial fibrillation, managed previously with amiodarone but now permanent.   Anticoagulation with Rivaroxaban  no bleeding   Has had hypotension limiting rate control  and cardiomyopathy drugs ( ie aldactone/entresto)     Because of dyspnea on exertion 1/23, bisoprolol was increased, digoxin continued and Jardiance, (from [place]) was anticipated but not fulfilled  TREMOR has been an issue with rare reports of it assoc with lisinopril.  No change.    Interval atrial fibrillation with rapid rates associated with ventricular tachycardia successfully paced terminated.  No driving and this has been emotionally traumatic.  Bisoprolol dose was increased, he has tolerated this well.  Increased ventricular pacing noted 3/24 prompting discontinuation of low-dose digoxin and down titration of bisoprolol      DATE Test EF%   3/15 Cath  35 % Nonobstructive disease  4/22  Echo 30-35%   4/22 Myoview  35% No ischemia           Date Cr K Hgb  10/18 0.95 4.9    3/20 0.96 4.5 14.8  3/21 1.03 4.3 14.9  11/21 1.0 4.2 15.6  4/22 1.01 4.1 14.4  1/23 0.87 4.7 15.9  6/23 0.88 3.7   10/23   0000000   Thromboembolic risk factors ( age  -2, HTN-1, Vasc disease -1, CHF-1,) for a CHADSVASc Score of 5  Past Medical History:  Diagnosis Date   Chronic HFrEF (heart failure with reduced ejection  fraction) ([place])    a. 08/2020 Echo: EF 30-35%, glob HK, mild LVH. Low nl RV fxn. Sev BAE. Mild MR. Mild-mod TR. Mild AI. Ao sclerosis.   Degenerative disk disease    lumbar   Disc herniation    GERD (gastroesophageal reflux disease)    Hyperlipidemia    Hypertension    ICD (implantable cardiac defibrillator) in place    a. 02/2017 gen change to MDT MRI compatible single lead device. Ser # [id number].   IVCD (intraventricular conduction defect)    Nonspecific QRS duration 122   Lumbar spinal stenosis    Neuromuscular disorder (HCC)    neauropathy   / siatica   NICM (nonischemic cardiomyopathy) ([place])    a. 07/2011 Echo: EF 25-30%-->s/p ICD; b. 02/2017 s/p ICD gen change; c. 08/2020 Echo: EF 30-35%.   Non-obstructive Coronary artery disease 08/15/2011   a. 08/2011 Cath: Moderate nonobstructive CAD; b. 07/2013 Cath: LM nl, LAD 22m, D1 50/84m, LCX 38m, RCA 30p, 50d-->Med rx; c. 08/2020 MV: EF 35%. No ischemia. Fixed apical defect.   Permanent atrial fibrillation [facility])    Sleep apnea    uses cpap    Past Surgical History:  Procedure Laterality Date   CARDIAC CATHETERIZATION  08/15/2011   [facility]    CARDIAC CATHETERIZATION  07/09/2013   [facility]   CARDIOVERSION  08/2011   [facility]   CHOLECYSTECTOMY  April 2015   COLONOSCOPY WITH PROPOFOL N/A 07/30/2015   Procedure: COLONOSCOPY WITH PROPOFOL;  Surgeon: Lollie Sails, MD;  Location: [facility] ENDOSCOPY;  Service: Endoscopy;  Laterality: N/A;   cooled thermotherapy  [id number]   [place], complicated by incontinence   HERNIA REPAIR     ICD  09/08/2011   ICD GENERATOR CHANGEOUT N/A 03/08/2017   Procedure: ICD GENERATOR CHANGEOUT;  Surgeon: [name], MD;  Location: [place] CV LAB;  Service: Cardiovascular;  Laterality: N/A;   IMPLANTABLE CARDIOVERTER DEFIBRILLATOR IMPLANT  09/08/2011   Procedure: IMPLANTABLE CARDIOVERTER DEFIBRILLATOR IMPLANT;  Surgeon: [name], MD;  Location: [facility] CATH LAB;  Service: Cardiovascular;;   LUMBAR LAMINECTOMY     L2 through  S1 with wide decompression of the thecal sac and nerve roots.   PROSTATE BIOPSY  2013    Current Outpatient Medications  Medication Sig Dispense Refill   B Complex-Folic Acid (SUPER B COMPLEX MAXI) TABS Take 1 tablet by mouth daily.     empagliflozin (JARDIANCE) 10 MG TABS tablet Take 1 tablet (10 mg total) by mouth daily. 90 tablet 3   ezetimibe (ZETIA) 10 MG tablet Take 1 tablet (10 mg total) by mouth daily. 90 tablet 1   finasteride (PROSCAR) 5 MG tablet TAKE ONE TABLET BY MOUTH EVERY DAY 90 tablet 3   furosemide (LASIX) 20 MG tablet Take 1 tablet (20 mg) by mouth EVERY OTHER day 90 tablet 1   gabapentin (NEURONTIN) 400 MG capsule Take 400 mg by mouth 2 (two) times daily.     hydrochlorothiazide (MICROZIDE) 12.5 MG capsule Take 1 tablet (12.5 mg) by mouth once daily in the morning 90 capsule 1   lidocaine (LIDODERM) 5 % Place 2 patches onto the skin daily. Remove & Discard patch within 12 hours or as directed by MD 60 patch 0   lisinopril (ZESTRIL) 2.5 MG tablet Take 1 tablet (2.5 mg total) by mouth daily. 90 tablet 3   loratadine (CLARITIN) 10 MG tablet TAKE ONE TABLET BY MOUTH DAILY AS NEEDED FOR ALLERGIES 90 tablet 1   multivitamin (ONE-A-DAY MEN'S) TABS tablet Take 1 tablet by mouth daily.     nitroGLYCERIN (NITROSTAT) 0.4 MG SL tablet Place 1 tablet (0.4 mg total) under the tongue every 5 (five) minutes as needed for chest pain. 25 tablet 1   omeprazole (PRILOSEC) 40 MG capsule Take 1 capsule (40 mg total) by mouth daily before breakfast. (Patient taking differently: Take 40 mg by mouth daily as needed.) 90 capsule 1   QUEtiapine (SEROQUEL) 25 MG tablet TAKE 1 TABLET BY MOUTH AT BEDTIME 90 tablet 1   rivaroxaban (XARELTO) 20 MG TABS tablet TAKE ONE TABLET BY MOUTH DAILY WITH SUPPER 90 tablet 1   rosuvastatin (CRESTOR) 40 MG tablet Take 1 tablet (40 mg total) by mouth daily. 90 tablet 1   tizanidine (ZANAFLEX) 2 MG capsule Take 1 capsule (2 mg total) by mouth 3 (three) times daily. 30  capsule 0   triamcinolone ointment (KENALOG) 0.1 % Apply 1 application topically daily as needed.     vitamin C (ASCORBIC ACID) 500 MG tablet Take 500 mg by mouth daily.     No current facility-administered medications for this visit.    Allergies  Allergen Reactions   Coreg [Carvedilol] Other (See Comments)    hypotension   Flomax [Tamsulosin Hcl] Other (See Comments)    Hypotension    Review of Systems negative except from HPI and PMH  Physical Exam: BP 118/74   Pulse 65   Ht 5' 6.5" (1.689 m)   Wt 206 lb (93.4 kg)  SpO2 97%   BMI 32.75 kg/m  Well developed and well nourished in no acute distress HENT normal Neck supple with JVP-flat Clear Irregularly Irregular rate and rhythm with slow  controlled  ventricular response , no  gallop No  murmur Abd-soft with active BS No Clubbing cyanosis  edema Skin-warm and dry A & Oriented  Grossly normal sensory and motor function  ECG atrial fibrillation at 65 with significant variability rates from cycle length of 480--1500 ms Right bundle branch block with left axis deviation QRSd is 164 ms  Device function is normal. Programming changes none  See Paceart for details    ECG atrial fibrillation at 71 Intervals-/15/45 Right bundle branch block left anterior fascicular block  ECG 4 /22 was associated with left bundle branch block  Assessment and  Plan  NICM  Congestive heart failure-acute/chronic-systolic grade 3  Afib-permanent  Ventricular tachycardia-intercurrent  Hypotension limiting guideline directed therapy  Right bundle branch block left anterior fascicular block  ICD-MDT    PTSD  Patient's volume status is stable.  Continue with furosemide 20 every other day  No bleeding on Xarelto, continue 20 mg daily  Heart rate excursion is likely still convening issue on the lower dose of bisoprolol which has been associate with improved resting rate now in the high 50s.  We will continue him off digoxin but  resume his bisoprolol at 5 mg.  Will bring him in for GXT to look for heart rate excursion.  I did mention that AV junction ablation may be important so as to be able to protect his heart from racing if we can control it adequately with medications.  I encouraged him to walk.  Suggested he get a chest heart rate monitor  Continues to struggle with not driving.  Acknowledges depression.  Lengthy discussions

## 2022-08-02 ENCOUNTER — Other Ambulatory Visit: Payer: Self-pay | Admitting: Family Medicine

## 2022-08-02 DIAGNOSIS — I2510 Atherosclerotic heart disease of native coronary artery without angina pectoris: Secondary | ICD-10-CM

## 2022-08-02 DIAGNOSIS — E785 Hyperlipidemia, unspecified: Secondary | ICD-10-CM

## 2022-08-05 NOTE — Progress Notes (Unsigned)
Name: Chris Price   MRN: [id number]    DOB: 12/12/1940   Date:08/08/2022       Progress Note  Subjective  Chief Complaint  Follow Up  HPI  Chronic afib: he sees Dr. Jens Som, he denies chest pain,  palpitation but has SOB with activity - but he stopped doing his yard due to increase in SOB , he has a pacemaker  he states SOB got worse in January, he is still on beta blocker, and will have a stress test next week    Chronic bronchitis: : he never smoked, second hand smoking as a child - both parents smoked - He has chronic cough that is productive and clear to white,  no wheezing, he is recovering from a cold    CHF :He has mild lower extremity edema.  He sees cardiologist, Dr. Caryl Comes.  He has been off  spironolactone back on low dose  beta-blocker, off lisinopril but is also on Jardiance. He denies orthopnea   Last Echo 08/2020 showed EF of 30-35 % global hypokinesis   CAD : last cardiac cath was 2015, on statin therapy-Crestor 40 mg and Zetia 10 mg but LDL not at goal, Discussed PCSK9 with patient today but not interested due to cost . Last LDL was 88 , explained PCSK9 likely covered by part B but not interested at this time    OSA/CPAP: he is very compliant with CPAP, he wakes up feeling refreshed in the mornings, no headaches, but wakes up with a dry mouth at times. . Unchanged    Pre-diabetes: last hgbA1C was slightly lower at 5.7 % , denies polyphagia, polydipsia or polyuria. He is on Jardicance for CHF. Unchanged    HTN: bp has been at goal lately, denies chest pain or palpitation. Stable    Paresthesia both feet: going on since 2007 after back surgery - Dr. Rennis Harding, but getting progressively worse. B12 and folate are normal, he is now on gabapentin given by Dr. [name] for tremors but continues to have feet numbness    Insomnia he is doing well on seroquel and denies side effects of medication    Tremors hands: going on for years, but worse since 2019 , seen at [facility] ,  he had a second opinion done at [facility] neurology. He is back seeing Dr. [name] had a normal MRI done 04/23, but will go see Dr. Carles Collet in [place]  He is still taking gabapentin , he is thinking about having surgery but worried about his heart first   Hearing loss: he sees ENT and is now wearing a hearing aid. Unchanged   BPH with LUTS: seeing Urologist , seeing Dr. Erlene Quan yearly now and is doing well, nocturia at most once a night , sometimes does not get up at all   Senile purpura: both arms, reassurance given .Unchanged   Patient Active Problem List   Diagnosis Date Noted   VT (ventricular tachycardia) ([place]) intercurrent 07/25/2022   LBBB (left bundle branch block) 07/25/2022   Mild cognitive impairment 06/07/2021   Permanent atrial fibrillation 12/31/2020   Senile purpura 01/31/2020   Primary insomnia 01/31/2020   Hearing loss, sensorineural 09/12/2017   Prediabetes 07/01/2017   Hx of colonic polyps 06/19/2016   Long-term use of high-risk medication 06/19/2016   Cardiomyopathy 07/14/2015   Low back pain due to displacement of intervertebral disc 04/09/2015   Diarrhea following gastrointestinal surgery 09/04/2013   S/P laparoscopic cholecystectomy 08/20/2013   Chronic atrial fibrillation 07/15/2013  Morbid obesity 02/28/2013   OSA on CPAP 01/03/2013   Hyperlipidemia with target low density lipoprotein (LDL) cholesterol less than 70 mg/dL 03/22/2012   ED (erectile dysfunction) of organic origin 01/23/2012   Benign localized prostatic hyperplasia with lower urinary tract symptoms (LUTS) 01/23/2012   Bladder outlet obstruction 12/25/2011   Essential tremor 12/22/2011   Hypertension    Elevated PSA, less than 10 ng/ml 09/20/2011   Automatic implantable cardioverter-defibrillator Medtronic dual-chamber [id number]   Chronic systolic heart failure [id number]   IVCD (intraventricular conduction defect)    Coronary artery disease 08/15/2011    Past Surgical History:  Procedure  Laterality Date   CARDIAC CATHETERIZATION  08/15/2011   [facility]    CARDIAC CATHETERIZATION  07/09/2013   [facility]   CARDIOVERSION  08/2011   [facility]   CHOLECYSTECTOMY  April 2015   COLONOSCOPY WITH PROPOFOL N/A 07/30/2015   Procedure: COLONOSCOPY WITH PROPOFOL;  Surgeon: Lollie Sails, MD;  Location: [facility] ENDOSCOPY;  Service: Endoscopy;  Laterality: N/A;   cooled thermotherapy  [id number]   [place], complicated by incontinence   HERNIA REPAIR     ICD  09/08/2011   ICD GENERATOR CHANGEOUT N/A 03/08/2017   Procedure: ICD GENERATOR CHANGEOUT;  Surgeon: [name], MD;  Location: [place] CV LAB;  Service: Cardiovascular;  Laterality: N/A;   IMPLANTABLE CARDIOVERTER DEFIBRILLATOR IMPLANT  09/08/2011   Procedure: IMPLANTABLE CARDIOVERTER DEFIBRILLATOR IMPLANT;  Surgeon: [name], MD;  Location: [facility] CATH LAB;  Service: Cardiovascular;;   LUMBAR LAMINECTOMY     L2 through S1 with wide decompression of the thecal sac and nerve roots.   PROSTATE BIOPSY  2013    Family History  Problem Relation Age of Onset   Heart attack Father    Heart attack Brother    Ovarian cancer Daughter    Heart attack Son     Social History   Tobacco Use   Smoking status: Never   Smokeless tobacco: Never  Substance Use Topics   Alcohol use: No     Current Outpatient Medications:    B Complex-Folic Acid (SUPER B COMPLEX MAXI) TABS, Take 1 tablet by mouth daily., Disp: , Rfl:    bisoprolol (ZEBETA) 5 MG tablet, Take 1 tablet (5 mg total) by mouth daily., Disp: 90 tablet, Rfl: 3   empagliflozin (JARDIANCE) 10 MG TABS tablet, Take 1 tablet (10 mg total) by mouth daily., Disp: 90 tablet, Rfl: 3   ezetimibe (ZETIA) 10 MG tablet, TAKE 1 TABLET BY MOUTH ONCE DAILY, Disp: 90 tablet, Rfl: 1   finasteride (PROSCAR) 5 MG tablet, TAKE ONE TABLET BY MOUTH EVERY DAY, Disp: 90 tablet, Rfl: 3   furosemide (LASIX) 20 MG tablet, Take 1 tablet (20 mg) by mouth EVERY OTHER day, Disp: 90 tablet, Rfl: 1   gabapentin (NEURONTIN) 400 MG  capsule, Take 400 mg by mouth 2 (two) times daily., Disp: , Rfl:    hydrochlorothiazide (MICROZIDE) 12.5 MG capsule, Take 1 tablet (12.5 mg) by mouth once daily in the morning, Disp: 90 capsule, Rfl: 1   lidocaine (LIDODERM) 5 %, Place 2 patches onto the skin daily. Remove & Discard patch within 12 hours or as directed by MD, Disp: 60 patch, Rfl: 0   lisinopril (ZESTRIL) 2.5 MG tablet, Take 1 tablet (2.5 mg total) by mouth daily., Disp: 90 tablet, Rfl: 3   loratadine (CLARITIN) 10 MG tablet, TAKE ONE TABLET BY MOUTH DAILY AS NEEDED FOR ALLERGIES, Disp: 90 tablet, Rfl: 1   multivitamin (ONE-A-DAY MEN'S) TABS tablet,  Take 1 tablet by mouth daily., Disp: , Rfl:    nitroGLYCERIN (NITROSTAT) 0.4 MG SL tablet, Place 1 tablet (0.4 mg total) under the tongue every 5 (five) minutes as needed for chest pain., Disp: 25 tablet, Rfl: 1   omeprazole (PRILOSEC) 40 MG capsule, Take 1 capsule (40 mg total) by mouth daily before breakfast. (Patient taking differently: Take 40 mg by mouth daily as needed.), Disp: 90 capsule, Rfl: 1   QUEtiapine (SEROQUEL) 25 MG tablet, TAKE 1 TABLET BY MOUTH AT BEDTIME, Disp: 90 tablet, Rfl: 1   rivaroxaban (XARELTO) 20 MG TABS tablet, TAKE ONE TABLET BY MOUTH DAILY WITH SUPPER, Disp: 90 tablet, Rfl: 1   rosuvastatin (CRESTOR) 40 MG tablet, Take 1 tablet (40 mg total) by mouth daily., Disp: 90 tablet, Rfl: 1   triamcinolone ointment (KENALOG) 0.1 %, Apply 1 application topically daily as needed., Disp: , Rfl:    vitamin C (ASCORBIC ACID) 500 MG tablet, Take 500 mg by mouth daily., Disp: , Rfl:   Allergies  Allergen Reactions   Coreg [Carvedilol] Other (See Comments)    hypotension   Flomax [Tamsulosin Hcl] Other (See Comments)    Hypotension    I personally reviewed active problem list, medication list, allergies, family history, social history, health maintenance with the patient/caregiver today.   ROS  Ten systems reviewed and is negative except as mentioned in HPI    Objective  Vitals:   08/08/22 0915  BP: 130/78  Pulse: (!) 54  Resp: 16  SpO2: 96%  Weight: 206 lb (93.4 kg)  Height: 5\' 6"  (1.676 m)    Body mass index is 33.25 kg/m.  Physical Exam  Constitutional: Patient appears well-developed and well-nourished. Obese  No distress.  HEENT: head atraumatic, normocephalic, pupils equal and reactive to light, neck supple Cardiovascular: Normal rate, irregular rhythm and normal heart sounds.  No murmur heard. No BLE edema Pulmonary/Chest: Effort normal and breath sounds normal. No respiratory distress. Abdominal: Soft.  There is no tenderness. Psychiatric: Patient has a normal mood and affect. behavior is normal. Judgment and thought content normal.   Recent Results (from the past 2160 hour(s))  CUP PACEART REMOTE DEVICE CHECK     Status: None   Collection Time: 05/31/22  4:42 AM  Result Value Ref Range   Date Time Interrogation Session [id number]    Pulse Generator Manufacturer MERM    Pulse Gen Model [id number] Evera MRI XT DR    Pulse Gen Serial Number [id number]    Clinic Name [place] Pulse Generator Type Implantable Cardiac Defibulator    Implantable Pulse Generator Implant Date [id number]    Implantable Lead Manufacturer [facility]    Implantable Lead Model 5076 CapSureFix Novus    Implantable Lead Serial Number [id number]    Implantable Lead Implant Date AH:1864640    Implantable Lead Location Detail 1 APPENDAGE    Implantable Lead Location [id number]    Implantable Lead Connection Status [id number]    Implantable Lead Manufacturer [facility]    Implantable Lead Model 6935 Sprint Quattro Secure S    Implantable Lead Serial Number [id number]    Implantable Lead Implant Date AH:1864640    Implantable Lead Location Detail 1 APEX    Implantable Lead Location [id number]    Implantable Lead Connection Status [id number]    Lead Channel Setting Sensing Sensitivity 0.6 mV   Lead Channel Setting Pacing Pulse Width 0.4 ms   Lead Channel  Setting Pacing Amplitude 2.5 V   Zone Setting Status  Active    Zone Setting Status Inactive    Zone Setting Status Active    Zone Setting Status 755011    Zone Setting Status 755011    Lead Channel Impedance Value 456 ohm   Lead Channel Impedance Value 494 ohm   Lead Channel Impedance Value 342 ohm   Lead Channel Sensing Intrinsic Amplitude 3.125 mV   Lead Channel Sensing Intrinsic Amplitude 3.125 mV   Lead Channel Pacing Threshold Amplitude 0.625 V   Lead Channel Pacing Threshold Pulse Width 0.4 ms   HighPow Impedance 68 ohm   Battery Status OK    Battery Remaining Longevity 49 mo   Battery Voltage 2.98 V   Brady Statistic RA Percent Paced 0 %   Brady Statistic RV Percent Paced 16.5 %   Brady Statistic AP VP Percent 0 %   Brady Statistic AS VP Percent 12.05 %   Brady Statistic AP VS Percent 0 %   Brady Statistic AS VS Percent 87.95 %  CUP PACEART INCLINIC DEVICE CHECK     Status: None   Collection Time: 07/26/22  5:14 PM  Result Value Ref Range   Date Time Interrogation Session [id number]    Pulse Generator Manufacturer MERM    Pulse Gen Model [id number] Evera MRI XT DR    Pulse Gen Serial Number [id number]    Clinic Name [place]    Implantable Pulse Generator Type Implantable Cardiac Defibulator    Implantable Pulse Generator Implant Date [id number]    Implantable Lead Manufacturer MERM    Implantable Lead Model 5076 CapSureFix Novus    Implantable Lead Serial Number [id number]    Implantable Lead Implant Date AH:1864640    Implantable Lead Location Detail 1 APPENDAGE    Implantable Lead Location [id number]    Implantable Lead Connection Status [id number]    Implantable Lead Manufacturer [facility]    Implantable Lead Model 6935 Sprint Quattro Secure S    Implantable Lead Serial Number [id number]    Implantable Lead Implant Date AH:1864640    Implantable Lead Location Detail 1 APEX    Implantable Lead Location [id number]    Implantable Lead Connection Status [id number]    Lead  Channel Setting Sensing Sensitivity 0.6 mV   Lead Channel Setting Pacing Pulse Width 0.4 ms   Lead Channel Setting Pacing Amplitude 2.5 V   Zone Setting Status Active    Zone Setting Status Inactive    Zone Setting Status Active    Zone Setting Status [id number]    Zone Setting Status [phone]    Lead Channel Impedance Value 456 ohm   Lead Channel Impedance Value 570 ohm   Lead Channel Impedance Value 437 ohm   Lead Channel Sensing Intrinsic Amplitude 3.375 mV   Lead Channel Sensing Intrinsic Amplitude 3.375 mV   Lead Channel Pacing Threshold Amplitude 0.75 V   Lead Channel Pacing Threshold Pulse Width 0.4 ms   HighPow Impedance 66 ohm   Battery Status OK    Battery Remaining Longevity 56 mo   Battery Voltage 2.97 V   Brady Statistic RA Percent Paced 0 %   Brady Statistic RV Percent Paced 15.54 %   Brady Statistic AP VP Percent 0 %   Brady Statistic AS VP Percent 13.11 %   Brady Statistic AP VS Percent 0 %   Brady Statistic AS VS Percent 86.89 %    PHQ2/9:    08/08/2022    9:15 AM 07/07/2022   11:05 AM 02/08/2022    8:18 AM 01/11/2022  3:29 PM 12/21/2021    2:26 PM  Depression screen PHQ 2/9  Decreased Interest 0 0 0 0 0  Down, Depressed, Hopeless 0 0 0 0 0  PHQ - 2 Score 0 0 0 0 0  Altered sleeping 0  0 0 0  Tired, decreased energy 0  0 0 0  Change in appetite 0  0 0 0  Feeling bad or failure about yourself  0  0 0 0  Trouble concentrating 0  0 0 0  Moving slowly or fidgety/restless 0  0 0 0  Suicidal thoughts 0  0 0 0  PHQ-9 Score 0  0 0 0  Difficult doing work/chores    Not difficult at all     phq 9 is negative   Fall Risk:    08/08/2022    9:15 AM 07/07/2022   10:58 AM 03/03/2022    1:11 PM 02/08/2022    8:18 AM 01/11/2022    3:29 PM  Fall Risk   Falls in the past year? 0 0 0 1 1  Number falls in past yr: 0 0  0 0  Injury with Fall? 0 0  0 0  Risk for fall due to : No Fall Risks No Fall Risks  No Fall Risks   Follow up Falls prevention discussed Education  provided;Falls prevention discussed Falls evaluation completed Falls prevention discussed       Functional Status Survey: Is the patient deaf or have difficulty hearing?: No Does the patient have difficulty seeing, even when wearing glasses/contacts?: No Does the patient have difficulty concentrating, remembering, or making decisions?: No Does the patient have difficulty walking or climbing stairs?: No Does the patient have difficulty dressing or bathing?: No Does the patient have difficulty doing errands alone such as visiting a doctor's office or shopping?: No    Assessment & Plan  1. Chronic systolic heart failure  Under the care of cardiologist   2. Other cardiomyopathy  Under the care of Dr. Caryl Comes  3. Senile purpura  Stable on both arms   4. Simple chronic bronchitis  Recovering from recent URI  5. Coronary artery disease due to lipid rich plaque  On statin therapy  6. Dyslipidemia   7. Mild cognitive impairment   8. History of lumbar laminectomy  Going to chiropractor now  9. Essential tremor  Under the care of neurologist   10. Primary insomnia   11. Essential hypertension  At goal  12. Gastroesophageal reflux disease without esophagitis  Stable  13. OSA on CPAP  Compliant   14. Longstanding persistent atrial fibrillation

## 2022-08-08 ENCOUNTER — Encounter: Payer: Self-pay | Admitting: Family Medicine

## 2022-08-08 ENCOUNTER — Ambulatory Visit (INDEPENDENT_AMBULATORY_CARE_PROVIDER_SITE_OTHER): Payer: PPO | Admitting: Family Medicine

## 2022-08-08 VITALS — BP 130/78 | HR 54 | Resp 16 | Ht 66.0 in | Wt 206.0 lb

## 2022-08-08 DIAGNOSIS — I428 Other cardiomyopathies: Secondary | ICD-10-CM | POA: Diagnosis not present

## 2022-08-08 DIAGNOSIS — G250 Essential tremor: Secondary | ICD-10-CM

## 2022-08-08 DIAGNOSIS — I5022 Chronic systolic (congestive) heart failure: Secondary | ICD-10-CM

## 2022-08-08 DIAGNOSIS — I2583 Coronary atherosclerosis due to lipid rich plaque: Secondary | ICD-10-CM

## 2022-08-08 DIAGNOSIS — I10 Essential (primary) hypertension: Secondary | ICD-10-CM | POA: Diagnosis not present

## 2022-08-08 DIAGNOSIS — F5101 Primary insomnia: Secondary | ICD-10-CM

## 2022-08-08 DIAGNOSIS — G4733 Obstructive sleep apnea (adult) (pediatric): Secondary | ICD-10-CM

## 2022-08-08 DIAGNOSIS — K219 Gastro-esophageal reflux disease without esophagitis: Secondary | ICD-10-CM

## 2022-08-08 DIAGNOSIS — I2510 Atherosclerotic heart disease of native coronary artery without angina pectoris: Secondary | ICD-10-CM

## 2022-08-08 DIAGNOSIS — Z9889 Other specified postprocedural states: Secondary | ICD-10-CM | POA: Diagnosis not present

## 2022-08-08 DIAGNOSIS — D692 Other nonthrombocytopenic purpura: Secondary | ICD-10-CM

## 2022-08-08 DIAGNOSIS — G3184 Mild cognitive impairment, so stated: Secondary | ICD-10-CM

## 2022-08-08 DIAGNOSIS — E785 Hyperlipidemia, unspecified: Secondary | ICD-10-CM

## 2022-08-08 DIAGNOSIS — J410 Simple chronic bronchitis: Secondary | ICD-10-CM | POA: Diagnosis not present

## 2022-08-08 DIAGNOSIS — I4811 Longstanding persistent atrial fibrillation: Secondary | ICD-10-CM

## 2022-08-11 ENCOUNTER — Encounter: Payer: Self-pay | Admitting: Urology

## 2022-08-11 ENCOUNTER — Ambulatory Visit: Payer: PPO | Admitting: Urology

## 2022-08-11 VITALS — BP 123/74 | HR 56 | Temp 97.4°F | Ht 66.0 in | Wt 205.3 lb

## 2022-08-11 DIAGNOSIS — R310 Gross hematuria: Secondary | ICD-10-CM

## 2022-08-11 DIAGNOSIS — N3289 Other specified disorders of bladder: Secondary | ICD-10-CM

## 2022-08-11 LAB — URINALYSIS, COMPLETE
Bilirubin, UA: NEGATIVE
Ketones, UA: NEGATIVE
Leukocytes,UA: NEGATIVE
Nitrite, UA: NEGATIVE
Specific Gravity, UA: 1.015 (ref 1.005–1.030)
Urobilinogen, Ur: 1 mg/dL (ref 0.2–1.0)
pH, UA: 6 (ref 5.0–7.5)

## 2022-08-11 LAB — MICROSCOPIC EXAMINATION: RBC, Urine: >30 /hpf — AB (ref 0–2)

## 2022-08-11 LAB — BLADDER SCAN AMB NON-IMAGING

## 2022-08-11 MED ORDER — CEFUROXIME AXETIL 250 MG PO TABS: 250.0000 mg | ORAL_TABLET | Freq: Two times a day (BID) | ORAL | 0 refills | Status: DC

## 2022-08-11 NOTE — Progress Notes (Signed)
08/11/2022 10:48 AM   [name] [birth year] [id number]  Referring provider: [name], MD [address] [place],  [place] [address]  Urological history: 1. BPH with LU TS -PSA (2017) 1.05 -remote history of negative prostate biopsy (2013) -TUMT > 10 years ago  -finasteride 5 mg daily   2. ED -Contributing factors of age, BPH, heart failure, coronary artery disease, hypertension, sleep apnea, lumbar disc disease, hyperlipidemia, and prediabetes  Chief Complaint  Patient presents with   Other    Blood clots in urine    HPI: NOAHH BAUSMAN is a 82 y.o. male who presents today for blood clots in urine with his wife, Judeen Hammans.   He was plagued with a nasty URI last week with a lot of hacking and coughing.  Then earlier this week he noticed a pink tinge to his urine and a passage of a string-like clot.  This concerned him, so he contacted our office for an appointment.  He does not have a prior history of recurrent urinary tract infections, nephrolithiasis, trauma to the genitourinary tract or malignancies of the genitourinary tract.  He does/not have a family medical history of nephrolithiasis, malignancies of the genitourinary tract or hematuria.  He is not having symptoms of frequent urination, urgency, dysuria, nocturia, incontinence, hesitancy, intermittency, straining to urinate or a weak urinary stream.    He not a smoker.  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       UA yellow slightly cloudy, 3+ glucose, specific area 1.015, 2+ blood, pH 6.0, 2+ protein, 0-5 WBCs, greater than 30 RBCs, 0-10 epithelial cells and a few bacteria.  PVR 7 mL   PMH: Past Medical History:  Diagnosis Date   Chronic HFrEF (heart failure with reduced ejection fraction)    a. 08/2020 Echo: EF 30-35%, glob HK, mild LVH. Low nl RV fxn. Sev BAE. Mild MR. Mild-mod TR. Mild AI. Ao sclerosis.   Degenerative disk disease     lumbar   Disc herniation    GERD (gastroesophageal reflux disease)    Hyperlipidemia    Hypertension    ICD (implantable cardiac defibrillator) in place    a. 02/2017 gen change to MDT MRI compatible single lead device. Ser # [id number].   IVCD (intraventricular conduction defect)    Nonspecific QRS duration 122   Lumbar spinal stenosis    Neuromuscular disorder    neauropathy   / siatica   NICM (nonischemic cardiomyopathy)    a. 07/2011 Echo: EF 25-30%-->s/p ICD; b. 02/2017 s/p ICD gen change; c. 08/2020 Echo: EF 30-35%.   Non-obstructive Coronary artery disease 08/15/2011   a. 08/2011 Cath: Moderate nonobstructive CAD; b. 07/2013 Cath: LM nl, LAD 84m, D1 50/23m, LCX 17m, RCA 30p, 50d-->Med rx; c. 08/2020 MV: EF 35%. No ischemia. Fixed apical defect.   Permanent atrial fibrillation    Sleep apnea    uses cpap    Surgical History: Past Surgical History:  Procedure Laterality Date   CARDIAC CATHETERIZATION  08/15/2011   [facility]    CARDIAC CATHETERIZATION  07/09/2013   [facility]   CARDIOVERSION  08/2011   [facility]   CHOLECYSTECTOMY  April 2015   COLONOSCOPY WITH PROPOFOL N/A 07/30/2015   Procedure: COLONOSCOPY WITH PROPOFOL;  Surgeon: Lollie Sails, MD;  Location: [facility] ENDOSCOPY;  Service: Endoscopy;  Laterality: N/A;   cooled thermotherapy  [id number]   [place], complicated by incontinence   HERNIA REPAIR  ICD  09/08/2011   ICD GENERATOR CHANGEOUT N/A 03/08/2017   Procedure: ICD GENERATOR CHANGEOUT;  Surgeon: [name], MD;  Location: [facility] CV LAB;  Service: Cardiovascular;  Laterality: N/A;   IMPLANTABLE CARDIOVERTER DEFIBRILLATOR IMPLANT  09/08/2011   Procedure: IMPLANTABLE CARDIOVERTER DEFIBRILLATOR IMPLANT;  Surgeon: [name], MD;  Location: [facility] CATH LAB;  Service: Cardiovascular;;   LUMBAR LAMINECTOMY     L2 through S1 with wide decompression of the thecal sac and nerve roots.   PROSTATE BIOPSY  2013    Home Medications:  Allergies as of 08/11/2022       Reactions   Coreg  [carvedilol] Other (See Comments)   hypotension   Flomax [tamsulosin Hcl] Other (See Comments)   Hypotension        Medication List        Accurate as of August 11, 2022 10:48 AM. If you have any questions, ask your nurse or doctor.          STOP taking these medications    lidocaine 5 % Commonly known as: Lidoderm Stopped by: Leo Weyandt, PA-C       TAKE these medications    ascorbic acid 500 MG tablet Commonly known as: VITAMIN C Take 500 mg by mouth daily.   bisoprolol 5 MG tablet Commonly known as: ZEBETA Take 1 tablet (5 mg total) by mouth daily.   cefUROXime 250 MG tablet Commonly known as: CEFTIN Take 1 tablet (250 mg total) by mouth 2 (two) times daily with a meal. Started by: Ashante Yellin, PA-C   empagliflozin 10 MG Tabs tablet Commonly known as: Jardiance Take 1 tablet (10 mg total) by mouth daily.   ezetimibe 10 MG tablet Commonly known as: ZETIA TAKE 1 TABLET BY MOUTH ONCE DAILY   finasteride 5 MG tablet Commonly known as: PROSCAR TAKE ONE TABLET BY MOUTH EVERY DAY   furosemide 20 MG tablet Commonly known as: LASIX Take 1 tablet (20 mg) by mouth EVERY OTHER day   gabapentin 400 MG capsule Commonly known as: NEURONTIN Take 400 mg by mouth 2 (two) times daily.   hydrochlorothiazide 12.5 MG capsule Commonly known as: MICROZIDE Take 1 tablet (12.5 mg) by mouth once daily in the morning   lisinopril 2.5 MG tablet Commonly known as: ZESTRIL Take 1 tablet (2.5 mg total) by mouth daily.   loratadine 10 MG tablet Commonly known as: CLARITIN TAKE ONE TABLET BY MOUTH DAILY AS NEEDED FOR ALLERGIES   multivitamin Tabs tablet Take 1 tablet by mouth daily.   nitroGLYCERIN 0.4 MG SL tablet Commonly known as: NITROSTAT Place 1 tablet (0.4 mg total) under the tongue every 5 (five) minutes as needed for chest pain.   omeprazole 40 MG capsule Commonly known as: PRILOSEC Take 1 capsule (40 mg total) by mouth daily before breakfast. What  changed:  when to take this reasons to take this   QUEtiapine 25 MG tablet Commonly known as: SEROQUEL TAKE 1 TABLET BY MOUTH AT BEDTIME   rivaroxaban 20 MG Tabs tablet Commonly known as: Xarelto TAKE ONE TABLET BY MOUTH DAILY WITH SUPPER   rosuvastatin 40 MG tablet Commonly known as: CRESTOR Take 1 tablet (40 mg total) by mouth daily.   Super B Complex Maxi Tabs Take 1 tablet by mouth daily.   triamcinolone ointment 0.1 % Commonly known as: KENALOG Apply 1 application topically daily as needed.        Allergies:  Allergies  Allergen Reactions   Coreg [Carvedilol] Other (See Comments)  hypotension   Flomax [Tamsulosin Hcl] Other (See Comments)    Hypotension    Family History: Family History  Problem Relation Age of Onset   Heart attack Father    Heart attack Brother    Ovarian cancer Daughter    Heart attack Son     Social History:  reports that he has never smoked. He has never used smokeless tobacco. He reports that he does not drink alcohol and does not use drugs.  ROS: Pertinent ROS in HPI  Physical Exam: BP 123/74   Pulse (!) 56   Temp (!) 97.4 F (36.3 C)   Ht 5\' 6"  (1.676 m)   Wt 205 lb 4.8 oz (93.1 kg)   BMI 33.14 kg/m   Constitutional:  Well nourished. Alert and oriented, No acute distress. HEENT: [place] AT, moist mucus membranes.  Trachea midline Cardiovascular: No clubbing, cyanosis, or edema. Respiratory: Normal respiratory effort, no increased work of breathing. Neurologic: Grossly intact, no focal deficits, moving all 4 extremities. Psychiatric: Normal mood and affect.  Laboratory Data: Lab Results  Component Value Date   WBC 5.0 02/08/2022   HGB 15.9 02/08/2022   HCT 47.6 02/08/2022   MCV 99.0 02/08/2022   PLT 151 02/08/2022    Lab Results  Component Value Date   CREATININE 0.79 02/08/2022       Component Value Date/Time   CHOL 158 02/08/2022 0931   CHOL 153 01/08/2021 0946   CHOL 118 07/09/2013 0155   HDL 50  02/08/2022 0931   HDL 45 01/08/2021 0946   HDL 31 (L) 07/09/2013 0155   CHOLHDL 3.2 02/08/2022 0931   VLDL 25.8 06/29/2017 0928   VLDL 11 07/09/2013 0155   LDLCALC 88 02/08/2022 0931   LDLCALC 76 07/09/2013 0155    Lab Results  Component Value Date   AST 37 (H) 02/08/2022   Lab Results  Component Value Date   ALT 40 02/08/2022    Urinalysis Results for orders placed or performed in visit on 08/11/22  CULTURE, URINE COMPREHENSIVE   Specimen: Urine   UR  Result Value Ref Range   Urine Culture, Comprehensive Preliminary report    Organism ID, Bacteria Comment   Microscopic Examination   Urine  Result Value Ref Range   WBC, UA 0-5 0 - 5 /hpf   RBC, Urine >30 (A) 0 - 2 /hpf   Epithelial Cells (non renal) 0-10 0 - 10 /hpf   Bacteria, UA Few None seen/Few  Urinalysis, Complete  Result Value Ref Range   Specific Gravity, UA 1.015 1.005 - 1.030   pH, UA 6.0 5.0 - 7.5   Color, UA Yellow Yellow   Appearance Ur Hazy (A) Clear   Leukocytes,UA Negative Negative   Protein,UA 2+ (A) Negative/Trace   Glucose, UA 3+ (A) Negative   Ketones, UA Negative Negative   RBC, UA 2+ (A) Negative   Bilirubin, UA Negative Negative   Urobilinogen, Ur 1.0 0.2 - 1.0 mg/dL   Nitrite, UA Negative Negative   Microscopic Examination See below:   Bladder Scan (Post Void Residual) in office  Result Value Ref Range   Scan Result 64ml   I have reviewed the labs.   Pertinent Imaging:  08/11/22 10:04  Scan Result 27ml   Assessment & Plan:    1. Blood clots in urine - Urinalysis, Complete - with micro heme - CULTURE, URINE COMPREHENSIVE - Bladder Scan (Post Void Residual) in office -we discussed that there are a number of causes that can be  associated with blood in the urine, such as stones, BPH, UTI's, damage to the urinary tract and/or cancer.   Sometimes, we do not find a cause or source of the hematuria.   -we discussed that new guidelines place individuals into risk categories of low,  intermediate and high risk categories.  These factors are based on age, smoking history and degree of blood in urine.   -we discussed that at this time, they are in the high risk stratification  -we discussed that the recommended protocol for further work up are are CT urogram and cysto, but I will send the urine for culture first and if the urine culture returns positive, we will treat with culture appropriate antibiotics and repeat the urinalysis in 2 months -If urine culture returns negative or the repeat urinalysis in 2 months demonstrates persistent microscopic hematuria, we will need to pursue the CT urogram and cystoscopy -we discussed that for a CT urogram a contrast material will be injected into a vein and that in rare instances, an allergic reaction can result and may even life threatening (1:100,000)  The patient denies any allergies to contrast, iodine and/or seafood and is not taking metformin. - we discussed that following the imaging study,  a cystoscopy is performed  -we discussed that a cystoscopy is a procedure that consists of passing a camera up their urethra after administering lidocaine to anesthetize and that after the procedure a minor amount of blood in the urine and/or burning which usually resolves in 24 to 48 hours may occur  -the patient had the opportunity to ask questions which were answered. Based upon this discussion, the patient is willing to proceed. Therefore, I've ordered: a CT Urogram and cystoscopy  - The patient will return following all of the above for discussion of the results.  - UA > 30 RBC's - Urine culture pending  Return for pending urine culture results .  These notes generated with voice recognition software. I apologize for typographical errors.  Cloretta Ned  [facility] [address]  [address] [place], [place] [address] [phone]

## 2022-08-11 NOTE — Patient Instructions (Signed)
  You may notice blood in your urine.  If your urine looks like the first three pictures above, this is considered minor bleeding and does not require further evaluation. If your urine looks like the Grade IV or Grade V photos above, please contact our office.  

## 2022-08-15 ENCOUNTER — Other Ambulatory Visit
Admission: RE | Admit: 2022-08-15 | Discharge: 2022-08-15 | Disposition: A | Discharge status: Home / Self Care | Payer: PPO | Source: Ambulatory Visit | Attending: Internal Medicine | Admitting: Internal Medicine

## 2022-08-15 ENCOUNTER — Ambulatory Visit
Admission: RE | Admit: 2022-08-15 | Discharge: 2022-08-15 | Disposition: A | Discharge status: Home / Self Care | Payer: PPO | Source: Ambulatory Visit | Attending: Internal Medicine | Admitting: Internal Medicine

## 2022-08-15 DIAGNOSIS — I4821 Permanent atrial fibrillation: Secondary | ICD-10-CM | POA: Insufficient documentation

## 2022-08-15 DIAGNOSIS — C44519 Basal cell carcinoma of skin of other part of trunk: Secondary | ICD-10-CM | POA: Diagnosis not present

## 2022-08-15 DIAGNOSIS — Z08 Encounter for follow-up examination after completed treatment for malignant neoplasm: Secondary | ICD-10-CM | POA: Diagnosis not present

## 2022-08-15 DIAGNOSIS — Z95810 Presence of automatic (implantable) cardiac defibrillator: Secondary | ICD-10-CM | POA: Diagnosis not present

## 2022-08-15 DIAGNOSIS — D225 Melanocytic nevi of trunk: Secondary | ICD-10-CM | POA: Diagnosis not present

## 2022-08-15 DIAGNOSIS — I5022 Chronic systolic (congestive) heart failure: Secondary | ICD-10-CM | POA: Insufficient documentation

## 2022-08-15 DIAGNOSIS — I447 Left bundle-branch block, unspecified: Secondary | ICD-10-CM | POA: Insufficient documentation

## 2022-08-15 DIAGNOSIS — L570 Actinic keratosis: Secondary | ICD-10-CM | POA: Diagnosis not present

## 2022-08-15 DIAGNOSIS — Z79899 Other long term (current) drug therapy: Secondary | ICD-10-CM

## 2022-08-15 DIAGNOSIS — I472 Ventricular tachycardia, unspecified: Secondary | ICD-10-CM | POA: Diagnosis not present

## 2022-08-15 DIAGNOSIS — D485 Neoplasm of uncertain behavior of skin: Secondary | ICD-10-CM | POA: Diagnosis not present

## 2022-08-15 DIAGNOSIS — I428 Other cardiomyopathies: Secondary | ICD-10-CM

## 2022-08-15 DIAGNOSIS — L820 Inflamed seborrheic keratosis: Secondary | ICD-10-CM | POA: Diagnosis not present

## 2022-08-15 DIAGNOSIS — Z85828 Personal history of other malignant neoplasm of skin: Secondary | ICD-10-CM | POA: Diagnosis not present

## 2022-08-15 LAB — BASIC METABOLIC PANEL
Anion gap: 9 (ref 5–15)
BUN: 13 mg/dL (ref 8–23)
CO2: 28 mmol/L (ref 22–32)
Calcium: 9 mg/dL (ref 8.9–10.3)
Chloride: 104 mmol/L (ref 98–111)
Creatinine, Ser: 0.83 mg/dL (ref 0.61–1.24)
GFR, Estimated: >60 mL/min (ref >60)
Glucose, Bld: 106 mg/dL — ABNORMAL HIGH (ref 70–99)
Potassium: 3.8 mmol/L (ref 3.5–5.1)
Sodium: 141 mmol/L (ref 135–145)

## 2022-08-15 LAB — EXERCISE TOLERANCE TEST
Angina Index: 0
Duke Treadmill Score: 4
Exercise duration (min): 4 min
Exercise duration (sec): 1 s
MPHR: 137 {beats}/min
Peak HR: 166 {beats}/min
Percent HR: 121 %
Rest HR: 78 {beats}/min
ST Depression (mm): 0 mm

## 2022-08-16 ENCOUNTER — Other Ambulatory Visit: Payer: Self-pay | Admitting: Urology

## 2022-08-16 DIAGNOSIS — R310 Gross hematuria: Secondary | ICD-10-CM

## 2022-08-16 LAB — CULTURE, URINE COMPREHENSIVE

## 2022-08-17 ENCOUNTER — Telehealth: Payer: Self-pay | Admitting: Family Medicine

## 2022-08-17 NOTE — Telephone Encounter (Signed)
Patient's wife [name] notified and does not want him to through with these tests until he has his urine checked agai. She states these are expensive tests and if it's not needed she doesn't want to have them done. Please advise

## 2022-08-17 NOTE — Telephone Encounter (Signed)
[name] patient's wife notified and CT order is placed and Cysto has been scheduled.

## 2022-08-17 NOTE — Telephone Encounter (Signed)
Harle Battiest, PA-C sent to Honor Loh, CMA Caller: Unspecified (Today, 12:01 PM) The issue that concerns me is that Chris Price had visible blood in his urine and the urine culture was negative for infection, so this may indicate a cancer as the cause of the blood that was seen in his urine or there is damage to his kidneys.  In both of these scenarios, it is common to have blood in the urine on a intermittent basis.  If we recheck the urine, and no blood is seen, but then [name] has another episode of blood clots a few months from now, this may potentially cause a delay in the diagnosis of the cancer or a delay in the diagnosis of kidney failure.   I would at the very least come in and have Dr. Apolinar Junes look into his bladder at this time.  Then if nothing is seen in the bladder, they can have further discussions with Dr. Apolinar Junes on how to proceed.

## 2022-08-17 NOTE — Telephone Encounter (Signed)
-----   Message from Harle Battiest, PA-C sent at 08/16/2022  4:47 PM EDT ----- Please let Chris Price know that his urine culture was negative for infection and we need to proceed with the hematuria work up.  I have placed orders for the CT urogram and we need to schedule him for cystoscopy with Dr. Apolinar Junes to go over those results as well

## 2022-08-24 ENCOUNTER — Ambulatory Visit
Admission: RE | Admit: 2022-08-24 | Discharge: 2022-08-24 | Disposition: A | Discharge status: Home / Self Care | Payer: PPO | Source: Ambulatory Visit | Attending: Urology | Admitting: Urology

## 2022-08-24 DIAGNOSIS — K760 Fatty (change of) liver, not elsewhere classified: Secondary | ICD-10-CM | POA: Diagnosis not present

## 2022-08-24 DIAGNOSIS — N3289 Other specified disorders of bladder: Secondary | ICD-10-CM | POA: Diagnosis not present

## 2022-08-24 DIAGNOSIS — R310 Gross hematuria: Secondary | ICD-10-CM | POA: Insufficient documentation

## 2022-08-24 DIAGNOSIS — N2889 Other specified disorders of kidney and ureter: Secondary | ICD-10-CM | POA: Diagnosis not present

## 2022-08-24 MED ORDER — IOHEXOL 300 MG/ML  SOLN
125.0000 mL | Freq: Once | INTRAMUSCULAR | Status: AC | PRN
  Administered 2022-08-24: 125 mL via INTRAVENOUS

## 2022-08-30 ENCOUNTER — Ambulatory Visit (INDEPENDENT_AMBULATORY_CARE_PROVIDER_SITE_OTHER): Payer: PPO

## 2022-08-30 DIAGNOSIS — I447 Left bundle-branch block, unspecified: Secondary | ICD-10-CM

## 2022-08-31 LAB — CUP PACEART REMOTE DEVICE CHECK
Battery Remaining Longevity: 54 mo
Battery Voltage: 2.98 V
Brady Statistic AP VP Percent: 0 %
Brady Statistic AP VS Percent: 0 %
Brady Statistic AS VP Percent: 5.04 %
Brady Statistic AS VS Percent: 94.96 %
Brady Statistic RA Percent Paced: 0 %
Brady Statistic RV Percent Paced: 5.27 %
Date Time Interrogation Session: 20240423033423
HighPow Impedance: 72 Ohm
Implantable Lead Connection Status: 753985
Implantable Lead Connection Status: 753985
Implantable Lead Implant Date: 20130502
Implantable Lead Implant Date: 20130502
Implantable Lead Location: 753859
Implantable Lead Location: 753860
Implantable Lead Model: 5076
Implantable Lead Model: 6935
Implantable Pulse Generator Implant Date: 20181031
Lead Channel Impedance Value: 342 Ohm
Lead Channel Impedance Value: 456 Ohm
Lead Channel Impedance Value: 513 Ohm
Lead Channel Pacing Threshold Amplitude: 0.75 V
Lead Channel Pacing Threshold Pulse Width: 0.4 ms
Lead Channel Sensing Intrinsic Amplitude: 3.5 mV
Lead Channel Sensing Intrinsic Amplitude: 3.5 mV
Lead Channel Setting Pacing Amplitude: 2.5 V
Lead Channel Setting Pacing Pulse Width: 0.4 ms
Lead Channel Setting Sensing Sensitivity: 0.6 mV
Zone Setting Status: 755011
Zone Setting Status: 755011

## 2022-09-06 DIAGNOSIS — C44519 Basal cell carcinoma of skin of other part of trunk: Secondary | ICD-10-CM | POA: Diagnosis not present

## 2022-09-13 ENCOUNTER — Other Ambulatory Visit: Payer: Self-pay | Admitting: Internal Medicine

## 2022-09-14 ENCOUNTER — Ambulatory Visit: Payer: PPO | Admitting: Urology

## 2022-09-14 VITALS — BP 125/75 | HR 87 | Ht 66.0 in | Wt 207.0 lb

## 2022-09-14 DIAGNOSIS — R310 Gross hematuria: Secondary | ICD-10-CM | POA: Diagnosis not present

## 2022-09-14 DIAGNOSIS — N401 Enlarged prostate with lower urinary tract symptoms: Secondary | ICD-10-CM

## 2022-09-14 LAB — URINALYSIS, COMPLETE
Bilirubin, UA: NEGATIVE
Ketones, UA: NEGATIVE
Leukocytes,UA: NEGATIVE
Nitrite, UA: NEGATIVE
Specific Gravity, UA: 1.01 (ref 1.005–1.030)
Urobilinogen, Ur: 1 mg/dL (ref 0.2–1.0)
pH, UA: 6 (ref 5.0–7.5)

## 2022-09-14 LAB — MICROSCOPIC EXAMINATION

## 2022-09-14 NOTE — Progress Notes (Signed)
   09/14/22  CC:  Chief Complaint  Patient presents with   Cysto    HPI: 82 year old male who presents today for gross hematuria evaluation.  Please see previous notes for details.  In the interim, he underwent CT urogram which showed evidence of chronic outlet obstruction, significant prostamegaly and an irregular nodular prostate.  Notably, he underwent TURP back in 2013 at which time he was in urinary retention.  He is on finasteride chronically.  Blood pressure 125/75, pulse 87, height 5\' 6"  (1.676 m), weight 207 lb (93.9 kg). NED. A&Ox3.   No respiratory distress   Abd soft, NT, ND Normal phallus with bilateral descended testicles  Cystoscopy Procedure Note  Patient identification was confirmed, informed consent was obtained, and patient was prepped using Betadine solution.  Lidocaine jelly was administered per urethral meatus.     Pre-Procedure: - Inspection reveals a normal caliber ureteral meatus.  Procedure: The flexible cystoscope was introduced without difficulty - No urethral strictures/lesions are present. - Enlarged prostate with irregular prostatic regrowth, TURP defect notable with friable hypervascularity on the surface of the prostate; some bleeding with manipulation noted -  Irregular  bladder neck - Bilateral ureteral orifices identified - Bladder mucosa  reveals no ulcers, tumors, or lesions - No bladder stones -Moderate trabeculation  Retroflexion shows large TURP defect with irregular asymmetric prostatic regrowth bulging up into that TUR defect   Post-Procedure: - Patient tolerated the procedure well  Assessment/ Plan:  1. Blood clots in urine Likely secondary to irregular prostatic regrowth, bleeding today noted with manipulation  Upper tract imaging is normal.  He does have evidence of markedly enlarged prostate with nodular regrowth on CT scan consistent with today's cystoscopic findings.  Continue finasteride - Urinalysis,  Complete  2. Benign localized prostatic hyperplasia with lower urinary tract symptoms (LUTS) Significant irregular prostatic regrowth as noted above  Currently he is minimally symptomatic on finasteride  Will continue to monitor his urinary symptoms and PVR; based on cystoscopic findings today he may be at risk for progression of his urinary symptoms  Will abstain from checking a PSA based on his age and comorbidities   Follow-up in 1 year with IPSS/PVR  Vanna Scotland, MD

## 2022-09-28 NOTE — Progress Notes (Signed)
Remote ICD transmission.   

## 2022-10-18 ENCOUNTER — Telehealth: Payer: Self-pay

## 2022-10-18 NOTE — Telephone Encounter (Signed)
Called the patient's home telephone number. Spoke to the patient's wife due to the patient not being at home. I informed the patient's wife that we received a refill request from [facility]. I also informed the patient's wife that we are not allowed to send prescriptions electronically or fax prescriptions to [facility]. I let her know that Dr. Graciela Husbands sign the refill request that was faxed to our office, but they would have to pick up the signed fax and send it back to [facility]. Wife verbalized understanding. Fax will be left at the front for pick up.

## 2022-10-25 DIAGNOSIS — H11153 Pinguecula, bilateral: Secondary | ICD-10-CM | POA: Diagnosis not present

## 2022-10-25 DIAGNOSIS — H25813 Combined forms of age-related cataract, bilateral: Secondary | ICD-10-CM | POA: Diagnosis not present

## 2022-10-25 DIAGNOSIS — E7800 Pure hypercholesterolemia, unspecified: Secondary | ICD-10-CM | POA: Diagnosis not present

## 2022-10-25 DIAGNOSIS — H35033 Hypertensive retinopathy, bilateral: Secondary | ICD-10-CM | POA: Diagnosis not present

## 2022-10-25 DIAGNOSIS — I10 Essential (primary) hypertension: Secondary | ICD-10-CM | POA: Diagnosis not present

## 2022-10-25 DIAGNOSIS — H18413 Arcus senilis, bilateral: Secondary | ICD-10-CM | POA: Diagnosis not present

## 2022-10-25 DIAGNOSIS — H5017 Alternating exotropia with V pattern: Secondary | ICD-10-CM | POA: Diagnosis not present

## 2022-10-25 DIAGNOSIS — Z135 Encounter for screening for eye and ear disorders: Secondary | ICD-10-CM | POA: Diagnosis not present

## 2022-10-25 DIAGNOSIS — H524 Presbyopia: Secondary | ICD-10-CM | POA: Diagnosis not present

## 2022-10-25 DIAGNOSIS — H04123 Dry eye syndrome of bilateral lacrimal glands: Secondary | ICD-10-CM | POA: Diagnosis not present

## 2022-10-31 ENCOUNTER — Other Ambulatory Visit: Payer: Self-pay | Admitting: Internal Medicine

## 2022-11-13 ENCOUNTER — Other Ambulatory Visit: Payer: Self-pay | Admitting: Internal Medicine

## 2022-11-29 ENCOUNTER — Ambulatory Visit (INDEPENDENT_AMBULATORY_CARE_PROVIDER_SITE_OTHER): Payer: PPO

## 2022-11-29 DIAGNOSIS — I447 Left bundle-branch block, unspecified: Secondary | ICD-10-CM | POA: Diagnosis not present

## 2022-12-01 LAB — CUP PACEART REMOTE DEVICE CHECK
Battery Voltage: 2.98 V
Brady Statistic AP VP Percent: 0 %
Brady Statistic AP VS Percent: 0 %
Brady Statistic AS VP Percent: 4.41 %
Brady Statistic AS VS Percent: 95.59 %
Brady Statistic RA Percent Paced: 0 %
Brady Statistic RV Percent Paced: 4.92 %
Implantable Lead Connection Status: 753985
Implantable Lead Connection Status: 753985
Implantable Lead Implant Date: 20130502
Implantable Lead Implant Date: 20130502
Implantable Lead Location: 753859
Implantable Lead Location: 753860
Implantable Lead Model: 5076
Implantable Pulse Generator Implant Date: 20181031
Lead Channel Impedance Value: 513 Ohm
Lead Channel Pacing Threshold Amplitude: 0.625 V
Lead Channel Pacing Threshold Pulse Width: 0.4 ms
Lead Channel Sensing Intrinsic Amplitude: 3.75 mV
Lead Channel Sensing Intrinsic Amplitude: 3.75 mV
Lead Channel Setting Pacing Amplitude: 2.5 V
Lead Channel Setting Pacing Pulse Width: 0.4 ms
Lead Channel Setting Sensing Sensitivity: 0.6 mV
Zone Setting Status: 755011
Zone Setting Status: 755011

## 2022-12-06 DIAGNOSIS — D2271 Melanocytic nevi of right lower limb, including hip: Secondary | ICD-10-CM | POA: Diagnosis not present

## 2022-12-06 DIAGNOSIS — D225 Melanocytic nevi of trunk: Secondary | ICD-10-CM | POA: Diagnosis not present

## 2022-12-06 DIAGNOSIS — L821 Other seborrheic keratosis: Secondary | ICD-10-CM | POA: Diagnosis not present

## 2022-12-06 DIAGNOSIS — D2262 Melanocytic nevi of left upper limb, including shoulder: Secondary | ICD-10-CM | POA: Diagnosis not present

## 2022-12-06 DIAGNOSIS — Z85828 Personal history of other malignant neoplasm of skin: Secondary | ICD-10-CM | POA: Diagnosis not present

## 2022-12-06 DIAGNOSIS — D2261 Melanocytic nevi of right upper limb, including shoulder: Secondary | ICD-10-CM | POA: Diagnosis not present

## 2022-12-16 NOTE — Progress Notes (Signed)
Remote ICD transmission.   

## 2022-12-25 ENCOUNTER — Encounter: Payer: Self-pay | Admitting: Pharmacist

## 2022-12-25 NOTE — Progress Notes (Signed)
Pharmacy Quality Measure Review  This patient is appearing on a report for being at risk of failing the adherence measure for diabetes medications this calendar year.   Medication: Jardiance 10 mg daily Last fill date: 12/12/22 for 30 day supply  Insurance report was not up to date. No action needed at this time.   Adam Phenix, PharmD PGY-1 Pharmacy Resident

## 2022-12-27 DIAGNOSIS — H25812 Combined forms of age-related cataract, left eye: Secondary | ICD-10-CM | POA: Diagnosis not present

## 2022-12-27 DIAGNOSIS — H2511 Age-related nuclear cataract, right eye: Secondary | ICD-10-CM | POA: Diagnosis not present

## 2023-01-03 ENCOUNTER — Other Ambulatory Visit: Payer: Self-pay | Admitting: Family Medicine

## 2023-01-03 DIAGNOSIS — I2510 Atherosclerotic heart disease of native coronary artery without angina pectoris: Secondary | ICD-10-CM

## 2023-01-03 DIAGNOSIS — E785 Hyperlipidemia, unspecified: Secondary | ICD-10-CM

## 2023-01-05 ENCOUNTER — Other Ambulatory Visit: Payer: Self-pay | Admitting: Family Medicine

## 2023-01-05 DIAGNOSIS — F5101 Primary insomnia: Secondary | ICD-10-CM

## 2023-01-18 ENCOUNTER — Telehealth: Payer: Self-pay | Admitting: *Deleted

## 2023-01-18 NOTE — Telephone Encounter (Addendum)
See Faxed form for cataract surgery approval sent to [facility]. For ICD device clearance:  Recommend magnet, procedure may interfere with device/use ECG monitoring during magnet placement.

## 2023-01-18 NOTE — Telephone Encounter (Signed)
   Patient Name: Chris Price  DOB: 07/15/1940 MRN: [id number]  Primary Cardiologist: Sherryl Manges, MD  Chart reviewed as part of pre-operative protocol coverage. Cataract extractions are recognized in guidelines as low risk surgeries that do not typically require specific preoperative testing or holding of blood thinner therapy. Therefore, given past medical history and time since last visit, based on ACC/AHA guidelines, Chris Price would be at acceptable risk for the planned procedure without further cardiovascular testing.   I will route this recommendation to the requesting party via Epic fax function and remove from pre-op pool.  Please call with questions.  Ronney Asters, NP 01/18/2023, 4:01 PM

## 2023-01-18 NOTE — Telephone Encounter (Signed)
   Pre-operative Risk Assessment    Patient Name: Chris Price  DOB: 1940/07/25 MRN: [id number]    DATE OF LAST VISIT: 07/26/22 DR. KLEIN DATE OF NEXT VISIT: NONE  Request for Surgical Clearance    Procedure:   CATARACT EXTRACTION RIGHT EYE FOLLOWED BY THE LEFT  Date of Surgery:  Clearance 02/01/23                                 Surgeon:  DR. Francee Piccolo DelMONTE Surgeon's Group or Practice Name:  [facility] Phone number:  [phone] EXT [phone] Fax number:  [phone]   Type of Clearance Requested:   - Medical ;NO MEDICATIONS LISTED AS NEEDING TO BE HELD NEEDS DEVICE CLEARANCE; WILL SEND TO DEVICE CLINIC AS WELL   Type of Anesthesia:   IV SEDATION   Additional requests/questions:    Elpidio Anis   01/18/2023, 3:32 PM

## 2023-02-01 DIAGNOSIS — H2511 Age-related nuclear cataract, right eye: Secondary | ICD-10-CM | POA: Diagnosis not present

## 2023-02-02 ENCOUNTER — Other Ambulatory Visit: Payer: Self-pay | Admitting: Internal Medicine

## 2023-02-17 NOTE — Progress Notes (Signed)
Name: Chris Price   MRN: [id number]    DOB: 1940/07/04   Date:02/20/2023       Progress Note  Subjective  Chief Complaint  Follow Up  HPI  Chronic afib: he sees Dr. Clide Cliff, he denies chest pain,  palpitation but has SOB with activity , he is doing well at this time    Chronic bronchitis: : he never smoked, second hand smoking as a child - both parents smoked - He has chronic cough that is productive and clear to white,  no wheezing, SOB is stable    CHF :He has mild lower extremity edema.  He sees cardiologist, Dr. Graciela Husbands.  He has been off  spironolactone back on low dose  beta-blocker, off lisinopril but is also on Jardiance. Symptoms are stable at this time   Last Echo 08/2020 showed EF of 30-35 % global hypokinesis   CAD : last cardiac cath was 2015, on statin therapy-Crestor 40 mg and Zetia 10 mg but LDL not at goal, Discussed PCSK9 with patient today but not interested due to cost . Last LDL was 88 , explained PCSK9 likely covered by part B but not interested at this time We will recheck labs at this time    OSA/CPAP: he is very compliant with CPAP, he wakes up feeling refreshed in the mornings, no headaches, but wakes up with a dry mouth at times but stable    Pre-diabetes: last hgbA1C was slightly lower at 5.7 % , denies polyphagia, polydipsia or polyuria. He is on Jardicance for CHF.  We will recheck A1C for hyperglycemia    HTN: bp has been at goal lately, denies chest pain or palpitation.     Paresthesia both feet: going on since 2007 after back surgery - Dr. Sharolyn Douglas, but getting progressively worse. B12 and folate are normal, he is now on gabapentin given by Dr. Sherryll Burger for tremors but symptoms did not change and he is now off medication and symptoms are stable. The paresthesia does not affect his balance    Insomnia he is doing well on seroquel and denies side effects of medication. Able to fall and stay asleep     Tremors hands: going on for years, but worse since 2019 ,  seen at [facility] , he had a second opinion done at [facility] neurology. He is back seeing Dr. Sherryll Burger had a normal MRI done 04/23, but will go see Dr. Arbutus Leas in [place]  He ran out of Gabapentin and did not noticed any changes of symptoms so he does not want to resume medication at this time  Hearing loss: he sees ENT and is now wearing a hearing aid. Unchanged   BPH with LUTS: he had an episode of blood in urine with clots earlier this year, CT was unremarkable and no recurrence since  Senile purpura: both arms, reassurance given. Stable   Patient Active Problem List   Diagnosis Date Noted   VT (ventricular tachycardia) (HCC) intercurrent 07/25/2022   LBBB (left bundle branch block) 07/25/2022   Mild cognitive impairment 06/07/2021   Permanent atrial fibrillation (HCC) 12/31/2020   Senile purpura (HCC) 01/31/2020   Primary insomnia 01/31/2020   Hearing loss, sensorineural 09/12/2017   Prediabetes 07/01/2017   Hx of colonic polyps 06/19/2016   Long-term use of high-risk medication 06/19/2016   Cardiomyopathy (HCC) 07/14/2015   Low back pain due to displacement of intervertebral disc 04/09/2015   Diarrhea following gastrointestinal surgery 09/04/2013   S/P laparoscopic cholecystectomy 08/20/2013  Chronic atrial fibrillation (HCC) 07/15/2013   Morbid obesity (HCC) 02/28/2013   OSA on CPAP 01/03/2013   Hyperlipidemia with target low density lipoprotein (LDL) cholesterol less than 70 mg/dL 16/02/9603   ED (erectile dysfunction) of organic origin 01/23/2012   Benign localized prostatic hyperplasia with lower urinary tract symptoms (LUTS) 01/23/2012   Bladder outlet obstruction 12/25/2011   Essential tremor 12/22/2011   Hypertension    Elevated PSA, less than 10 ng/ml 09/20/2011   Automatic implantable cardioverter-defibrillator Medtronic dual-chamber 09/09/2011   Chronic systolic heart failure (HCC) 08/25/2011   IVCD (intraventricular conduction defect)    Coronary artery disease  08/15/2011    Past Surgical History:  Procedure Laterality Date   CARDIAC CATHETERIZATION  08/15/2011   [facility]    CARDIAC CATHETERIZATION  07/09/2013   [facility]   CARDIOVERSION  08/2011   [facility]   CATARACT EXTRACTION Right    [facility]   CHOLECYSTECTOMY  08/2013   COLONOSCOPY WITH PROPOFOL N/A 07/30/2015   Procedure: COLONOSCOPY WITH PROPOFOL;  Surgeon: Christena Deem, MD;  Location: [facility] ENDOSCOPY;  Service: Endoscopy;  Laterality: N/A;   cooled thermotherapy  2007   [place], complicated by incontinence   HERNIA REPAIR     ICD  09/08/2011   ICD GENERATOR CHANGEOUT N/A 03/08/2017   Procedure: ICD GENERATOR CHANGEOUT;  Surgeon: Duke Salvia, MD;  Location: [facility] INVASIVE CV LAB;  Service: Cardiovascular;  Laterality: N/A;   IMPLANTABLE CARDIOVERTER DEFIBRILLATOR IMPLANT  09/08/2011   Procedure: IMPLANTABLE CARDIOVERTER DEFIBRILLATOR IMPLANT;  Surgeon: Duke Salvia, MD;  Location: [facility] CATH LAB;  Service: Cardiovascular;;   LUMBAR LAMINECTOMY     L2 through S1 with wide decompression of the thecal sac and nerve roots.   PROSTATE BIOPSY  2013    Family History  Problem Relation Age of Onset   Heart attack Father    Heart attack Brother    Ovarian cancer Daughter    Heart attack Son     Social History   Tobacco Use   Smoking status: Never   Smokeless tobacco: Never  Substance Use Topics   Alcohol use: No     Current Outpatient Medications:    B Complex-Folic Acid (SUPER B COMPLEX MAXI) TABS, Take 1 tablet by mouth daily., Disp: , Rfl:    bisoprolol (ZEBETA) 5 MG tablet, Take 1 tablet (5 mg total) by mouth daily., Disp: 90 tablet, Rfl: 0   finasteride (PROSCAR) 5 MG tablet, TAKE ONE TABLET BY MOUTH EVERY DAY, Disp: 90 tablet, Rfl: 3   furosemide (LASIX) 20 MG tablet, TAKE ONE TABLET BY MOUTH EVERY DAY, Disp: 90 tablet, Rfl: 1   hydrochlorothiazide (MICROZIDE) 12.5 MG capsule, Take 1 tablet (12.5 mg) by mouth once daily in the morning, Disp: 90 capsule, Rfl: 1    JARDIANCE 10 MG TABS tablet, TAKE 1 TABLET BY MOUTH DAILY, Disp: 90 tablet, Rfl: 1   lisinopril (ZESTRIL) 2.5 MG tablet, Take 1 tablet (2.5 mg total) by mouth daily., Disp: 90 tablet, Rfl: 3   loratadine (CLARITIN) 10 MG tablet, TAKE ONE TABLET BY MOUTH DAILY AS NEEDED FOR ALLERGIES, Disp: 90 tablet, Rfl: 1   multivitamin (ONE-A-DAY MEN'S) TABS tablet, Take 1 tablet by mouth daily., Disp: , Rfl:    nitroGLYCERIN (NITROSTAT) 0.4 MG SL tablet, Place 1 tablet (0.4 mg total) under the tongue every 5 (five) minutes as needed for chest pain., Disp: 25 tablet, Rfl: 1   omeprazole (PRILOSEC) 40 MG capsule, Take 1 capsule (40 mg total) by mouth daily  before breakfast. (Patient taking differently: Take 40 mg by mouth daily as needed.), Disp: 90 capsule, Rfl: 1   QUEtiapine (SEROQUEL) 25 MG tablet, TAKE 1 TABLET BY MOUTH AT BEDTIME, Disp: 90 tablet, Rfl: 1   rivaroxaban (XARELTO) 20 MG TABS tablet, TAKE ONE TABLET BY MOUTH DAILY WITH SUPPER, Disp: 90 tablet, Rfl: 1   rosuvastatin (CRESTOR) 40 MG tablet, TAKE ONE TABLET BY MOUTH EVERY DAY, Disp: 90 tablet, Rfl: 1   triamcinolone ointment (KENALOG) 0.1 %, Apply 1 application topically daily as needed., Disp: , Rfl:    vitamin C (ASCORBIC ACID) 500 MG tablet, Take 500 mg by mouth daily., Disp: , Rfl:    ezetimibe (ZETIA) 10 MG tablet, Take 1 tablet (10 mg total) by mouth daily., Disp: 90 tablet, Rfl: 1  Allergies  Allergen Reactions   Coreg [Carvedilol] Other (See Comments)    hypotension   Flomax [Tamsulosin Hcl] Other (See Comments)    Hypotension    I personally reviewed active problem list, medication list, allergies, family history, social history, health maintenance with the patient/caregiver today.   ROS  Ten systems reviewed and is negative except as mentioned in HPI    Objective  Vitals:   02/20/23 0950  BP: 122/72  Pulse: 73  Resp: 16  Temp: 97.7 F (36.5 C)  TempSrc: Oral  SpO2: 95%  Weight: 199 lb 1.6 oz (90.3 kg)  Height: 5\' 6"   (1.676 m)    Body mass index is 32.14 kg/m.  Physical Exam  Constitutional: Patient appears well-developed and well-nourished. Obese  No distress.  HEENT: head atraumatic, normocephalic, pupils equal and reactive to light, neck supple Cardiovascular: Normal rate, irregular rhythm and normal heart sounds.  No murmur heard. No BLE edema. Varicose veins on both legs, right worse than left  Pulmonary/Chest: Effort normal and breath sounds normal. No respiratory distress. Abdominal: Soft.  There is no tenderness. Psychiatric: Patient has a normal mood and affect. behavior is normal. Judgment and thought content normal.   PHQ2/9:    02/20/2023    9:52 AM 08/08/2022    9:15 AM 07/07/2022   11:05 AM 02/08/2022    8:18 AM 01/11/2022    3:29 PM  Depression screen PHQ 2/9  Decreased Interest 0 0 0 0 0  Down, Depressed, Hopeless 0 0 0 0 0  PHQ - 2 Score 0 0 0 0 0  Altered sleeping 0 0  0 0  Tired, decreased energy 0 0  0 0  Change in appetite 0 0  0 0  Feeling bad or failure about yourself  0 0  0 0  Trouble concentrating 0 0  0 0  Moving slowly or fidgety/restless 0 0  0 0  Suicidal thoughts 0 0  0 0  PHQ-9 Score 0 0  0 0  Difficult doing work/chores     Not difficult at all    phq 9 is negative   Fall Risk:    02/20/2023    9:52 AM 08/08/2022    9:15 AM 07/07/2022   10:58 AM 03/03/2022    1:11 PM 02/08/2022    8:18 AM  Fall Risk   Falls in the past year? 0 0 0 0 1  Number falls in past yr:  0 0  0  Injury with Fall?  0 0  0  Risk for fall due to : No Fall Risks No Fall Risks No Fall Risks  No Fall Risks  Follow up Falls prevention discussed Falls prevention discussed  Education provided;Falls prevention discussed Falls evaluation completed Falls prevention discussed    Assessment & Plan  1. Chronic atrial fibrillation (HCC)  Rate controlled   2. Simple chronic bronchitis (HCC)  Stable   3. Chronic systolic heart failure (HCC)  Denies orthopnea, on SGL-2 agonist   4.  Other cardiomyopathy (HCC)  Up to date with cardiologist visits   5. Senile purpura (HCC)  Reassurance given   6. Dyslipidemia  - ezetimibe (ZETIA) 10 MG tablet; Take 1 tablet (10 mg total) by mouth daily.  Dispense: 90 tablet; Refill: 1 - Lipid panel  7. Need for immunization against influenza  - Flu Vaccine Trivalent High Dose (Fluad)  8. Essential tremor  Off medication  9. OSA on CPAP  Compliant   10. Hyperglycemia  - Hemoglobin A1c  11. Long-term use of high-risk medication  - CBC with Differential/Platelet - COMPLETE METABOLIC PANEL WITH GFR  12. Weight loss  - TSH

## 2023-02-20 ENCOUNTER — Ambulatory Visit (INDEPENDENT_AMBULATORY_CARE_PROVIDER_SITE_OTHER): Payer: PPO | Admitting: Family Medicine

## 2023-02-20 ENCOUNTER — Encounter: Payer: Self-pay | Admitting: Family Medicine

## 2023-02-20 VITALS — BP 122/72 | HR 73 | Temp 97.7°F | Resp 16 | Ht 66.0 in | Wt 199.1 lb

## 2023-02-20 DIAGNOSIS — J410 Simple chronic bronchitis: Secondary | ICD-10-CM

## 2023-02-20 DIAGNOSIS — I428 Other cardiomyopathies: Secondary | ICD-10-CM

## 2023-02-20 DIAGNOSIS — Z79899 Other long term (current) drug therapy: Secondary | ICD-10-CM

## 2023-02-20 DIAGNOSIS — G4733 Obstructive sleep apnea (adult) (pediatric): Secondary | ICD-10-CM | POA: Diagnosis not present

## 2023-02-20 DIAGNOSIS — E785 Hyperlipidemia, unspecified: Secondary | ICD-10-CM | POA: Diagnosis not present

## 2023-02-20 DIAGNOSIS — R739 Hyperglycemia, unspecified: Secondary | ICD-10-CM

## 2023-02-20 DIAGNOSIS — G250 Essential tremor: Secondary | ICD-10-CM | POA: Diagnosis not present

## 2023-02-20 DIAGNOSIS — R634 Abnormal weight loss: Secondary | ICD-10-CM

## 2023-02-20 DIAGNOSIS — Z23 Encounter for immunization: Secondary | ICD-10-CM | POA: Diagnosis not present

## 2023-02-20 DIAGNOSIS — D692 Other nonthrombocytopenic purpura: Secondary | ICD-10-CM | POA: Diagnosis not present

## 2023-02-20 DIAGNOSIS — I5022 Chronic systolic (congestive) heart failure: Secondary | ICD-10-CM | POA: Diagnosis not present

## 2023-02-20 DIAGNOSIS — I482 Chronic atrial fibrillation, unspecified: Secondary | ICD-10-CM

## 2023-02-20 MED ORDER — EZETIMIBE 10 MG PO TABS: 10.0000 mg | ORAL_TABLET | Freq: Every day | ORAL | 1 refills | Status: DC

## 2023-02-21 ENCOUNTER — Other Ambulatory Visit: Payer: Self-pay | Admitting: Internal Medicine

## 2023-02-21 LAB — COMPLETE METABOLIC PANEL WITH GFR
AG Ratio: 1.6 (calc) (ref 1.0–2.5)
ALT: 34 U/L (ref 9–46)
AST: 35 U/L (ref 10–35)
Albumin: 4.2 g/dL (ref 3.6–5.1)
Alkaline phosphatase (APISO): 48 U/L (ref 35–144)
BUN: 17 mg/dL (ref 7–25)
CO2: 28 mmol/L (ref 20–32)
Calcium: 9.3 mg/dL (ref 8.6–10.3)
Chloride: 107 mmol/L (ref 98–110)
Creat: 0.88 mg/dL (ref 0.70–1.22)
Globulin: 2.7 g/dL (ref 1.9–3.7)
Glucose, Bld: 78 mg/dL (ref 65–99)
Potassium: 4.1 mmol/L (ref 3.5–5.3)
Sodium: 144 mmol/L (ref 135–146)
Total Bilirubin: 1.2 mg/dL (ref 0.2–1.2)
Total Protein: 6.9 g/dL (ref 6.1–8.1)
eGFR: 85 mL/min/{1.73_m2} (ref >=60)

## 2023-02-21 LAB — CBC WITH DIFFERENTIAL/PLATELET
Absolute Monocytes: 574 {cells}/uL (ref 200–950)
Basophils Absolute: 41 {cells}/uL (ref 0–200)
Basophils Relative: 0.7 %
Eosinophils Absolute: 99 {cells}/uL (ref 15–500)
Eosinophils Relative: 1.7 %
HCT: 50.1 % — ABNORMAL HIGH (ref 38.5–50.0)
Hemoglobin: 16.1 g/dL (ref 13.2–17.1)
Lymphs Abs: 1259 {cells}/uL (ref 850–3900)
MCH: 31.1 pg (ref 27.0–33.0)
MCHC: 32.1 g/dL (ref 32.0–36.0)
MCV: 96.9 fL (ref 80.0–100.0)
MPV: 10.4 fL (ref 7.5–12.5)
Monocytes Relative: 9.9 %
Neutro Abs: 3828 {cells}/uL (ref 1500–7800)
Neutrophils Relative %: 66 %
Platelets: 149 10*3/uL (ref 140–400)
RBC: 5.17 10*6/uL (ref 4.20–5.80)
RDW: 14.4 % (ref 11.0–15.0)
Total Lymphocyte: 21.7 %
WBC: 5.8 10*3/uL (ref 3.8–10.8)

## 2023-02-21 LAB — LIPID PANEL
Cholesterol: 134 mg/dL (ref <200)
HDL: 47 mg/dL (ref >=40)
LDL Cholesterol (Calc): 68 mg/dL
Non-HDL Cholesterol (Calc): 87 mg/dL (ref <130)
Total CHOL/HDL Ratio: 2.9 (calc) (ref <5.0)
Triglycerides: 109 mg/dL (ref <150)

## 2023-02-21 LAB — HEMOGLOBIN A1C
Hgb A1c MFr Bld: 6.1 %{Hb} — ABNORMAL HIGH (ref <5.7)
Mean Plasma Glucose: 128 mg/dL
eAG (mmol/L): 7.1 mmol/L

## 2023-02-21 LAB — TSH: TSH: 1.44 m[IU]/L (ref 0.40–4.50)

## 2023-02-28 ENCOUNTER — Ambulatory Visit (INDEPENDENT_AMBULATORY_CARE_PROVIDER_SITE_OTHER): Payer: PPO

## 2023-02-28 DIAGNOSIS — I428 Other cardiomyopathies: Secondary | ICD-10-CM

## 2023-02-28 DIAGNOSIS — I5022 Chronic systolic (congestive) heart failure: Secondary | ICD-10-CM

## 2023-03-02 LAB — CUP PACEART REMOTE DEVICE CHECK
Battery Remaining Longevity: 45 mo
Battery Voltage: 2.97 V
Brady Statistic AP VP Percent: 0 %
Brady Statistic AP VS Percent: 0 %
Brady Statistic AS VP Percent: 4.96 %
Brady Statistic AS VS Percent: 95.04 %
Brady Statistic RA Percent Paced: 0 %
Brady Statistic RV Percent Paced: 5.81 %
Date Time Interrogation Session: 20241022031805
HighPow Impedance: 70 Ohm
Implantable Lead Connection Status: 753985
Implantable Lead Connection Status: 753985
Implantable Lead Implant Date: 20130502
Implantable Lead Implant Date: 20130502
Implantable Lead Location: 753859
Implantable Lead Location: 753860
Implantable Lead Model: 5076
Implantable Lead Model: 6935
Implantable Pulse Generator Implant Date: 20181031
Lead Channel Impedance Value: 380 Ohm
Lead Channel Impedance Value: 456 Ohm
Lead Channel Impedance Value: 494 Ohm
Lead Channel Pacing Threshold Amplitude: 0.625 V
Lead Channel Pacing Threshold Pulse Width: 0.4 ms
Lead Channel Sensing Intrinsic Amplitude: 3.5 mV
Lead Channel Sensing Intrinsic Amplitude: 3.5 mV
Lead Channel Setting Pacing Amplitude: 2.5 V
Lead Channel Setting Pacing Pulse Width: 0.4 ms
Lead Channel Setting Sensing Sensitivity: 0.6 mV
Zone Setting Status: 755011
Zone Setting Status: 755011

## 2023-03-17 NOTE — Progress Notes (Signed)
Remote ICD transmission.   

## 2023-03-20 ENCOUNTER — Other Ambulatory Visit: Payer: Self-pay | Admitting: Urology

## 2023-03-20 DIAGNOSIS — N401 Enlarged prostate with lower urinary tract symptoms: Secondary | ICD-10-CM

## 2023-03-24 ENCOUNTER — Other Ambulatory Visit: Payer: Self-pay | Admitting: Internal Medicine

## 2023-03-24 ENCOUNTER — Telehealth: Payer: Self-pay | Admitting: Family Medicine

## 2023-03-24 DIAGNOSIS — I428 Other cardiomyopathies: Secondary | ICD-10-CM

## 2023-03-24 NOTE — Telephone Encounter (Signed)
Refill sent in today by provider in a separate refill encounter.

## 2023-03-24 NOTE — Telephone Encounter (Signed)
Prescription refill request for Xarelto received.  Indication:afib Last office visit:3/24 Weight:90.3  kg Age:82 Scr:0.88  10/24 CrCl:79.81  ml/min  Prescription refilled

## 2023-03-24 NOTE — Telephone Encounter (Signed)
Medication Refill -  Most Recent Primary Care Visit:  Provider: Alba Cory  Department: [facility]  Visit Type: OFFICE VISIT  Date: 02/20/2023  Medication: rivaroxaban (XARELTO) 20 MG TABS tablet [[id number]]   Has the patient contacted their pharmacy? Yes  (Agent: If yes, when and what did the pharmacy advise?) Contact PCP   Is this the correct pharmacy for this prescription? Yes  This is the patient's preferred pharmacy:  [facility] - [place], [place] - [address] [place] [place] [address] Phone: [phone] Fax: [phone]  Has the prescription been filled recently? Yes  Is the patient out of the medication? Yes  Has the patient been seen for an appointment in the last year OR does the patient have an upcoming appointment? Yes  Can we respond through MyChart? No  Agent: Please be advised that Rx refills may take up to 3 business days. We ask that you follow-up with your pharmacy.  Pt is going out of town and wants to know if Dr. Carlynn Purl can refill this medication

## 2023-04-04 ENCOUNTER — Other Ambulatory Visit: Payer: Self-pay | Admitting: Internal Medicine

## 2023-04-12 DIAGNOSIS — H25812 Combined forms of age-related cataract, left eye: Secondary | ICD-10-CM | POA: Diagnosis not present

## 2023-04-12 DIAGNOSIS — G4733 Obstructive sleep apnea (adult) (pediatric): Secondary | ICD-10-CM | POA: Diagnosis not present

## 2023-04-20 ENCOUNTER — Ambulatory Visit (INDEPENDENT_AMBULATORY_CARE_PROVIDER_SITE_OTHER): Payer: PPO | Admitting: Physician Assistant

## 2023-04-20 ENCOUNTER — Ambulatory Visit: Payer: Self-pay

## 2023-04-20 VITALS — BP 136/84 | HR 91 | Resp 16 | Ht 66.0 in | Wt 199.0 lb

## 2023-04-20 DIAGNOSIS — M47896 Other spondylosis, lumbar region: Secondary | ICD-10-CM | POA: Diagnosis not present

## 2023-04-20 DIAGNOSIS — M545 Low back pain, unspecified: Secondary | ICD-10-CM | POA: Diagnosis not present

## 2023-04-20 MED ORDER — PREDNISONE 20 MG PO TABS
ORAL_TABLET | ORAL | 0 refills | Status: DC
Start: 2023-04-20 — End: 2023-05-18

## 2023-04-20 NOTE — Telephone Encounter (Signed)
  Chief Complaint: muscle spasms Symptoms: per wife, pt has R sided pain above waist, intermittent but 10/10 when present  Frequency: couple of days  Pertinent Negatives: Patient denies any other sx  Disposition: [] ED /[] Urgent Care (no appt availability in office) / [x] Appointment(In office/virtual)/ []  [facility]/ [] Home Care/ [] Refused Recommended Disposition /[] [place]/ []  Follow-up with PCP Additional Notes: pt's wife states he has been going to Chiropractic to see if that would help and hasn't. Pt was hurting so bad yesterday he laid in bed all day. They have gotten a walker and BSC because she is unable to help him when he has the spasms. She states that Dr. Carlynn Purl gave him an injection back in the spring. No appts with her until 05/17/23. Scheduled OV today at 1120 with Erin, PA.    Summary: Injection request, muscle spasms   Best contact: [phone]  Pt is experiencing muscle spasms and would like to receive a cortisone injection.     Reason for Disposition  [1] MODERATE pain (e.g., interferes with normal activities) AND [2] present > 3 days  Answer Assessment - Initial Assessment Questions 1. ONSET: "When did the muscle aches or body pains start?"      Couple of days  2. LOCATION: "What part of your body is hurting?" (e.g., entire body, arms, legs)      Waist on R side, intermittent  3. SEVERITY: "How bad is the pain?" (Scale 1-10; or mild, moderate, severe)   - MILD (1-3): doesn't interfere with normal activities    - MODERATE (4-7): interferes with normal activities or awakens from sleep    - SEVERE (8-10):  excruciating pain, unable to do any normal activities      Moderate  6. OTHER SYMPTOMS: "Do you have any other symptoms?" (e.g., chest pain, weakness, rash, cold or flu symptoms, weight loss)     Weakness  Protocols used: Muscle Aches and Body Pain-A-AH

## 2023-04-20 NOTE — Progress Notes (Signed)
Acute Office Visit   Patient: Chris Price   DOB: Oct 31, 1940   82 y.o. Male  MRN: [id number] Visit Date: 04/20/2023  Today's healthcare provider: Oswaldo Conroy Travius Crochet, PA-C  Introduced myself to the patient as a Secondary school teacher and provided education on APPs in clinical practice.    Chief Complaint  Patient presents with   Flank Pain    R side, x2 days. Wife gave patient Tizanidine 4mg  w/no help. Patient taking Tylenol PRN for pain. Sees Chiropractor w/no help.   Subjective    HPI HPI     Flank Pain    Additional comments: R side, x2 days. Wife gave patient Tizanidine 4mg  w/no help. Patient taking Tylenol PRN for pain. Sees Chiropractor w/no help.      Last edited by Dollene Primrose, CMA on 04/20/2023 11:31 AM.      Back Pain   Onset: gradual  Duration: ongoing for a few weeks  Location: lower back along the right side  Radiation: radiates down right side to level of knee  Pain level and character: reports there is a pinching sensation, can be 10/10 with spasms  Other associated symptoms: Reports weakness in his legs - has been using walker to ambulate  Interventions: has seen chiropractor and taken Tizanidine without relief. Tylenol  Alleviating: nothing  Aggravating: sitting upright aggravates it   Denies saddle anesthesia, incontinence symptoms  He reports his feet are numb but this has been ongoing for years      Medications: Outpatient Medications Prior to Visit  Medication Sig   B Complex-Folic Acid (SUPER B COMPLEX MAXI) TABS Take 1 tablet by mouth daily.   bisoprolol (ZEBETA) 5 MG tablet TAKE ONE TABLET BY MOUTH EVERY DAY   ezetimibe (ZETIA) 10 MG tablet Take 1 tablet (10 mg total) by mouth daily.   finasteride (PROSCAR) 5 MG tablet TAKE ONE TABLET BY MOUTH EVERY DAY   furosemide (LASIX) 20 MG tablet TAKE ONE TABLET BY MOUTH EVERY DAY   hydrochlorothiazide (MICROZIDE) 12.5 MG capsule Take 1 tablet (12.5 mg) by mouth once daily in the morning   JARDIANCE 10  MG TABS tablet TAKE 1 TABLET BY MOUTH DAILY   lisinopril (ZESTRIL) 2.5 MG tablet TAKE ONE TABLET BY MOUTH DAILY   loratadine (CLARITIN) 10 MG tablet TAKE ONE TABLET BY MOUTH DAILY AS NEEDED FOR ALLERGIES   multivitamin (ONE-A-DAY MEN'S) TABS tablet Take 1 tablet by mouth daily.   nitroGLYCERIN (NITROSTAT) 0.4 MG SL tablet Place 1 tablet (0.4 mg total) under the tongue every 5 (five) minutes as needed for chest pain.   omeprazole (PRILOSEC) 40 MG capsule Take 1 capsule (40 mg total) by mouth daily before breakfast. (Patient taking differently: Take 40 mg by mouth daily as needed.)   QUEtiapine (SEROQUEL) 25 MG tablet TAKE 1 TABLET BY MOUTH AT BEDTIME   rosuvastatin (CRESTOR) 40 MG tablet TAKE ONE TABLET BY MOUTH EVERY DAY   triamcinolone ointment (KENALOG) 0.1 % Apply 1 application topically daily as needed.   vitamin C (ASCORBIC ACID) 500 MG tablet Take 500 mg by mouth daily.   XARELTO 20 MG TABS tablet TAKE 1 TABLET BY MOUTH DAILY WITH SUPPER   No facility-administered medications prior to visit.    Review of Systems  Musculoskeletal:  Positive for back pain and gait problem.  Neurological:  Positive for weakness.        Objective    BP 136/84   Pulse 91   Resp 16  Ht 5\' 6"  (1.676 m)   Wt 199 lb (90.3 kg)   SpO2 94%   BMI 32.12 kg/m     Physical Exam Vitals reviewed.  Constitutional:      General: He is awake.     Appearance: Normal appearance. He is well-developed and well-groomed.  HENT:     Head: Normocephalic and atraumatic.  Pulmonary:     Effort: Pulmonary effort is normal.  Musculoskeletal:     Cervical back: Normal range of motion.     Thoracic back: Decreased range of motion.     Lumbar back: Spasms and tenderness present. Decreased range of motion.  Neurological:     Mental Status: He is alert and oriented to person, place, and time.     GCS: GCS eye subscore is 4. GCS verbal subscore is 5. GCS motor subscore is 6.     Gait: Gait abnormal.  Psychiatric:         Attention and Perception: Attention normal.        Mood and Affect: Affect is angry.        Speech: Speech normal.        Behavior: Behavior is agitated. Behavior is cooperative.       No results found for any visits on 04/20/23.  Assessment & Plan      No follow-ups on file.     Problem List Items Addressed This Visit   None Visit Diagnoses       Intermittent low back pain    -  Primary   Relevant Medications   predniSONE (DELTASONE) 20 MG tablet   Other Relevant Orders   DG Lumbar Spine Complete   Ambulatory referral to Neurosurgery      Chronic issue with acute exacerbation Reports ongoing right sided lumbar pain with radiation to right upper leg  He has been taking Tylenol for pain management along with Tizanidine but this is not helping. Offered 3 day course of hydrocodone but patient and wife asked instead for cortisone shot. Reviewed that I would be sending in prednisone taper and this plus a steroid shot could have more side effects and issues vs just one. Would prefer to provide steroid taper for longer coverage of inflammation and pain while waiting to get in with specialty Will provide referral to neurosurgery given previous back issues and surgical interventions Will get imaging to rule out osseous deformity or acute injury Results to dictate further management  Reviewed ED and return precautions  Follow up as needed for persistent or progressing symptoms     No follow-ups on file.   I, Chemeka Filice E Nyeisha Goodall, PA-C, have reviewed all documentation for this visit. The documentation on 04/20/23 for the exam, diagnosis, procedures, and orders are all accurate and complete.   Jacquelin Hawking, MHS, PA-C [facility]

## 2023-04-23 NOTE — Progress Notes (Signed)
This patient is appearing on a report for being at risk of failing the adherence measure for cholesterol (statin) medications this calendar year.   Medication: rosuvastatin 40 mg PO daily Last fill date: 04/19/23 for 90 day supply  Insurance report was not up to date. No action needed at this time. No other adherence concerns noted at this time - no recent dispenses for hydrochlorothiazide but BP at last PCP visit was well controlled.   Nils Pyle, PharmD PGY1 Pharmacy Resident

## 2023-05-05 DIAGNOSIS — M461 Sacroiliitis, not elsewhere classified: Secondary | ICD-10-CM | POA: Diagnosis not present

## 2023-05-05 DIAGNOSIS — M791 Myalgia, unspecified site: Secondary | ICD-10-CM | POA: Diagnosis not present

## 2023-05-05 DIAGNOSIS — M5416 Radiculopathy, lumbar region: Secondary | ICD-10-CM | POA: Diagnosis not present

## 2023-05-08 NOTE — Progress Notes (Signed)
 Referring Physician:  Marylene Rocky BRAVO, PA-C [address] [place],  [place] [address]  Primary Physician:  Sowles, Krichna, MD  History of Present Illness: 05/11/2023 [name] has a history of essential tremor, lower extremity neuropathy, CAD, HTN, heart failure, obesity, hyperlipidemia, OSA with CPAP, BPH, afib.   He has an implantable cardiac defibrillator.   He saw [facility] for his back on 04/20/23. He has also been seeing Dr. Gust.   History of lumbar decompression L2-S1 in 2004 by Dr. Gust. Pain improved after this surgery.   Now with 1-2 months of constant right sided LBP. No leg pain, but he has weakness in right leg. He has spasms in his back and leg. Pain is worse with standing and walking. He has neuropathy- he has numbness/tingling in feet x years. He has giving way of right leg with weakness. Pain is better with laying flat.   He is taking XARELTO . Was given prednisone  by PCP on 04/20/23- this did not help.   He had local injection at Dr. Iran office with some relief. He was given oxycodone  by [facility].   Dr. Gust is out of network with his insurance.   Bowel/Bladder Dysfunction: none  Conservative measures:  Physical therapy: did years ago, sees chiropractor regularly.  Multimodal medical therapy including regular antiinflammatories: zanaflex , tylenol , prednisone  Injections: No recent epidural steroid injections  Past Surgery:  History of lumbar decompression L2-S1 in 2004  The symptoms are causing a significant impact on the patient's life.   Review of Systems:  A 10 point review of systems is negative, except for the pertinent positives and negatives detailed in the HPI.  Past Medical History: Past Medical History:  Diagnosis Date   Chronic HFrEF (heart failure with reduced ejection fraction) (HCC)    a. 08/2020 Echo: EF 30-35%, glob HK, mild LVH. Low nl RV fxn. Sev BAE. Mild MR. Mild-mod TR. Mild AI. Ao sclerosis.   Degenerative  disk disease    lumbar   Disc herniation    GERD (gastroesophageal reflux disease)    Hyperlipidemia    Hypertension    ICD (implantable cardiac defibrillator) in place    a. 02/2017 gen change to MDT MRI compatible single lead device. Ser # [id number].   IVCD (intraventricular conduction defect)    Nonspecific QRS duration 122   Lumbar spinal stenosis    Neuromuscular disorder (HCC)    neauropathy   / siatica   NICM (nonischemic cardiomyopathy) (HCC)    a. 07/2011 Echo: EF 25-30%-->s/p ICD; b. 02/2017 s/p ICD gen change; c. 08/2020 Echo: EF 30-35%.   Non-obstructive Coronary artery disease 08/15/2011   a. 08/2011 Cath: Moderate nonobstructive CAD; b. 07/2013 Cath: LM nl, LAD 24m, D1 50/97m, LCX 25m, RCA 30p, 50d-->Med rx; c. 08/2020 MV: EF 35%. No ischemia. Fixed apical defect.   Permanent atrial fibrillation (HCC)    Sleep apnea    uses cpap    Past Surgical History: Past Surgical History:  Procedure Laterality Date   CARD[facility]ATION  [facility]    CARDIAC CATHETERIZATION  07/09/2013   [facility]   CARDIOVERSION  08/2011   [facility]   CATARACT EXTRACTION Right    [facility]   CHOLECYSTECTOMY  08/2013   COLONOSCOPY WITH PROPOFOL  N/A 07/30/2015   Procedure: COLONOSCOPY WITH PROPOFOL ;  Surgeon: Gladis RAYMOND Mariner, MD;  Location: [facility] ENDOSCOPY;  Service: Endoscopy;  Laterality: N/A;   cooled thermotherapy  2007   Wolfe, complicated by incontinence   HERNIA  REPAIR     ICD  09/08/2011   ICD GENERATOR CHANGEOUT N/A 03/08/2017   Procedure: ICD GENERATOR CHANGEOUT;  Surgeon: Fernande Elspeth BROCKS, MD;  Location: [facility] INVASIVE CV LAB;  Service: Cardiovascular;  Laterality: N/A;   IMPLANTABLE CARDIOVERTER DEFIBRILLATOR IMPLANT  09/08/2011   Procedure: IMPLANTABLE CARDIOVERTER DEFIBRILLATOR IMPLANT;  Surgeon: Elspeth BROCKS Fernande, MD;  Location: [facility] CATH LAB;  Service: Cardiovascular;;   LUMBAR LAMINECTOMY     L2 through S1 with wide decompression of the thecal sac and nerve roots.    PROSTATE BIOPSY  2013    Allergies: Allergies as of 05/11/2023 - Review Complete 05/11/2023  Allergen Reaction Noted   Coreg  [carvedilol ] Other (See Comments) 04/04/2018   Flomax [tamsulosin hcl] Other (See Comments) 04/04/2018    Medications: Outpatient Encounter Medications as of 05/11/2023  Medication Sig   B Complex-Folic Acid  (SUPER B COMPLEX MAXI) TABS Take 1 tablet by mouth daily.   bisoprolol  (ZEBETA ) 5 MG tablet TAKE ONE TABLET BY MOUTH EVERY DAY   ezetimibe  (ZETIA ) 10 MG tablet Take 1 tablet (10 mg total) by mouth daily.   finasteride  (PROSCAR ) 5 MG tablet TAKE ONE TABLET BY MOUTH EVERY DAY   furosemide  (LASIX ) 20 MG tablet TAKE ONE TABLET BY MOUTH EVERY DAY   hydrochlorothiazide  (MICROZIDE ) 12.5 MG capsule Take 1 tablet (12.5 mg) by mouth once daily in the morning   JARDIANCE  10 MG TABS tablet TAKE 1 TABLET BY MOUTH DAILY   lisinopril  (ZESTRIL ) 2.5 MG tablet TAKE ONE TABLET BY MOUTH DAILY   loratadine  (CLARITIN ) 10 MG tablet TAKE ONE TABLET BY MOUTH DAILY AS NEEDED FOR ALLERGIES   multivitamin (ONE-A-DAY MEN'S) TABS tablet Take 1 tablet by mouth daily.   nitroGLYCERIN  (NITROSTAT ) 0.4 MG SL tablet Place 1 tablet (0.4 mg total) under the tongue every 5 (five) minutes as needed for chest pain.   omeprazole  (PRILOSEC) 40 MG capsule Take 1 capsule (40 mg total) by mouth daily before breakfast. (Patient taking differently: Take 40 mg by mouth daily as needed.)   predniSONE  (DELTASONE ) 20 MG tablet Take 60mg  PO daily x 2 days, then40mg  PO daily x 2 days, then 20mg  PO daily x 3 days   QUEtiapine  (SEROQUEL ) 25 MG tablet TAKE 1 TABLET BY MOUTH AT BEDTIME   rosuvastatin  (CRESTOR ) 40 MG tablet TAKE ONE TABLET BY MOUTH EVERY DAY   triamcinolone  ointment (KENALOG ) 0.1 % Apply 1 application topically daily as needed.   vitamin C (ASCORBIC ACID) 500 MG tablet Take 500 mg by mouth daily.   XARELTO  20 MG TABS tablet TAKE 1 TABLET BY MOUTH DAILY WITH SUPPER   No facility-administered encounter  medications on file as of 05/11/2023.    Social History: Social History   Tobacco Use   Smoking status: Never   Smokeless tobacco: Never  Vaping Use   Vaping status: Never Used  Substance Use Topics   Alcohol use: No   Drug use: No    Family Medical History: Family History  Problem Relation Age of Onset   Heart attack Father    Heart attack Brother    Ovarian cancer Daughter    Heart attack Son     Physical Examination: There were no vitals filed for this visit.  General: Patient is well developed, well nourished, calm, collected, and in no apparent distress. Attention to examination is appropriate.  Respiratory: Patient is breathing without any difficulty.   NEUROLOGICAL:     Awake, alert, oriented to person, place, and time.  Speech is clear and  fluent. Fund of knowledge is appropriate.   Cranial Nerves: Pupils equal round and reactive to light.  Facial tone is symmetric.    Well healed lumbar incision.   No lower lumbar tenderness.    No abnormal lesions on exposed skin.   Strength: Side Biceps Triceps Deltoid Interossei Grip Wrist Ext. Wrist Flex.  R 5 5 5 5 5 5 5   L 5 5 5 5 5 5 5    Side Iliopsoas Quads Hamstring PF DF EHL  R 5 5 5 5 5 5   L 5 5 5 5 5 5    No weakness with strength testing of iliopsoas, but he has pain with this on both right and left.   Reflexes are 2+ and symmetric at the biceps, brachioradialis, patella and achilles.   Hoffman's is absent.  Clonus is not present.   Bilateral upper and lower extremity sensation is intact to light touch.     Tremor noted in right hand.   He ambulates slowly with a walker.   Medical Decision Making  Imaging: Lumbar xrays dated 05/05/23:  Diffuse moderate lumbar spondylosis and DDD, worse at L4-S1, retrolisthesis L3-L4.   No report available for above xrays.   Assessment and Plan: Mr. Stumpe has a history of lumbar decompression L2-S1 in 2004 by Dr. Gust. Pain improved after this surgery.    Now with 1-2 months of constant right sided LBP. No leg pain, but he has weakness in right leg. He has spasms in his back and leg. Pain is worse with standing and walking. He has neuropathy- he has numbness/tingling in feet x years.   He has known diffuse moderate lumbar spondylosis and DDD, worse at L4-S1, retrolisthesis L3-L4.   No recent MRI done- was ordered by Dr. Iran office but has not yet been scheduled. He has cardiac defibrillator and MRI needs to be at [facility] in [place].   Treatment options discussed with patient and following plan made:   - He would like to come here for follow up after his MRI. Dr. Gust is out of network with his insurance.  - He will let me know when MRI is done so I can look out for results.  - ROI for Dr. Gust and [facility] to get his records.  - Will schedule follow up visit to review MRI results once I get them back. Depending on results, will likely consider injections.  - No relief with PT in the past. He sees chiropractor regularly.   BP was elevated. No symptoms of chest pain, shortness of breath, blurry vision, or headaches. He checks BP at home and it generally runs lower. Will recheck at home and call PCP if not improved. If he develops CP, SOB, blurry vision, or headaches, then he will go to ED.     I spent a total of 35 minutes in face-to-face and non-face-to-face activities related to this patient's care today including review of outside records, review of imaging, review of symptoms, physical exam, discussion of differential diagnosis, discussion of treatment options, and documentation.   Thank you for involving me in the care of this patient.   Glade Boys PA-C Dept. of Neurosurgery

## 2023-05-11 ENCOUNTER — Ambulatory Visit: Payer: PPO | Admitting: Orthopedic Surgery

## 2023-05-11 ENCOUNTER — Inpatient Hospital Stay
Admission: RE | Admit: 2023-05-11 | Discharge: 2023-05-11 | Disposition: A | Discharge status: Home / Self Care | Payer: Self-pay | Source: Ambulatory Visit | Attending: Orthopedic Surgery | Admitting: Orthopedic Surgery

## 2023-05-11 ENCOUNTER — Encounter: Payer: Self-pay | Admitting: Orthopedic Surgery

## 2023-05-11 ENCOUNTER — Other Ambulatory Visit: Payer: Self-pay

## 2023-05-11 VITALS — BP 148/90 | Ht 66.0 in | Wt 195.0 lb

## 2023-05-11 DIAGNOSIS — Z049 Encounter for examination and observation for unspecified reason: Secondary | ICD-10-CM

## 2023-05-11 DIAGNOSIS — M4316 Spondylolisthesis, lumbar region: Secondary | ICD-10-CM | POA: Diagnosis not present

## 2023-05-11 DIAGNOSIS — M47816 Spondylosis without myelopathy or radiculopathy, lumbar region: Secondary | ICD-10-CM | POA: Diagnosis not present

## 2023-05-11 DIAGNOSIS — M5136 Other intervertebral disc degeneration, lumbar region with discogenic back pain only: Secondary | ICD-10-CM

## 2023-05-11 DIAGNOSIS — Z9889 Other specified postprocedural states: Secondary | ICD-10-CM

## 2023-05-11 NOTE — Patient Instructions (Signed)
 It was so nice to see you today. Thank you so much for coming in.    You have some wear and tear in your back (arthritis) and this is likely what is causing your pain.   Once you have the lumbar MRI done, please message me to let me know. Once I get the results back, we can set up a follow up to review them.   It can take 14-21 days for me to get the results back.   You can call  Imaging at [phone] if you don't hear anything about the MRI.   I will work on getting your records from [facility] and Dr. Gust.   Please do not hesitate to call if you have any questions or concerns. You can also message me in MyChart.   Your blood pressure was elevated today. I want you to recheck it at home and follow up with your PCP if it remains high. If you have any chest pain, shortness of breath, blurry vision, or headaches then you need to go to ED.    Glade Boys PA-C [phone]     The physicians and staff at [facility] are committed to providing excellent care. You may receive a survey asking for feedback about your experience at our office. We value you your feedback and appreciate you taking the time to to fill it out. The [facility] leadership team is also available to discuss your experience in person, feel free to contact us  [phone].

## 2023-05-12 ENCOUNTER — Telehealth: Payer: Self-pay | Admitting: Orthopedic Surgery

## 2023-05-12 ENCOUNTER — Other Ambulatory Visit: Payer: Self-pay | Admitting: Orthopaedic Surgery

## 2023-05-12 DIAGNOSIS — M5416 Radiculopathy, lumbar region: Secondary | ICD-10-CM

## 2023-05-12 DIAGNOSIS — M47816 Spondylosis without myelopathy or radiculopathy, lumbar region: Secondary | ICD-10-CM

## 2023-05-12 DIAGNOSIS — Z9889 Other specified postprocedural states: Secondary | ICD-10-CM

## 2023-05-12 DIAGNOSIS — M5136 Other intervertebral disc degeneration, lumbar region with discogenic back pain only: Secondary | ICD-10-CM

## 2023-05-12 NOTE — Telephone Encounter (Signed)
 I called the patient to let him know that his lumbar MRI was ordered to be done at [facility] He is aware he needs to have his pacemaker card with him on this date. I let him know that they will call to make his appointment for the scan.

## 2023-05-12 NOTE — Telephone Encounter (Signed)
 I called patient to find out more about the MRI that the patient's wife is requesting to be done. Chris Price answered the phone stating they would like for him to get an MRI of the lower back as soon as possible. He is complaining of significant pain causing him to need to lay flat, with difficulty walking due to the pain.

## 2023-05-12 NOTE — Telephone Encounter (Signed)
 Patient's wife called stating she would need Drake Leach to order her husbands MRI. He needs to have it done at a h]hospital setting due to his pacemaker. [facility] is fine for them and she said we can call her cell number with any questions

## 2023-05-12 NOTE — Telephone Encounter (Signed)
 I ordered his lumbar MRI. He will need to bring pacemaker card with him. MRI will need to be done at [facility] in [place] since he has a pacemaker. Please let him know.

## 2023-05-16 ENCOUNTER — Ambulatory Visit: Payer: PPO | Admitting: Orthopedic Surgery

## 2023-05-18 ENCOUNTER — Telehealth: Payer: Self-pay | Admitting: Orthopedic Surgery

## 2023-05-18 DIAGNOSIS — M5136 Other intervertebral disc degeneration, lumbar region with discogenic back pain only: Secondary | ICD-10-CM

## 2023-05-18 DIAGNOSIS — Z9889 Other specified postprocedural states: Secondary | ICD-10-CM

## 2023-05-18 DIAGNOSIS — M47816 Spondylosis without myelopathy or radiculopathy, lumbar region: Secondary | ICD-10-CM

## 2023-05-18 MED ORDER — METHOCARBAMOL 500 MG PO TABS
500.0000 mg | ORAL_TABLET | Freq: Three times a day (TID) | ORAL | 0 refills | Status: AC | PRN
Start: 2023-05-18 — End: ?

## 2023-05-18 MED ORDER — HYDROCODONE-ACETAMINOPHEN 5-325 MG PO TABS
1.0000 | ORAL_TABLET | Freq: Four times a day (QID) | ORAL | 0 refills | Status: DC | PRN
Start: 2023-05-18 — End: 2023-07-10

## 2023-05-18 NOTE — Telephone Encounter (Signed)
 Patient's wife called stating her husband is in a lot of pain. Hard for him to move and get out of bed. Having back pain still wanting to know what he can do in the meantime.please advise   MRI appointment 1.20.25

## 2023-05-18 NOTE — Telephone Encounter (Signed)
 Attempted to call home # - your call cannot be completed at this time. Please hang up and try your call again later.  I called pt's mobile and spoke with him. He requested that I speak with his wife. She reports he was doing pretty well the past few days, but the last couple of nights he has been doing worse and can hardly move. She gave him a hydrocodone  this morning and methocarbamol  500mg  this morning and this afternoon. When asked if the medications helped at all, he said I think so.   The pain is in the right side of his low. Today it is going down his right leg. If he wasn't using his walker, he feels like he would fall. He feels like the weakness is probably worse today due to the pain. He denies issues with bowel or bladder. He denies numbness in his private areas.  If he sits up or tries to stand up, he shakes (like he is shivering). If he lays down, he is OK.  His MRI is on 05/29/23.  I discussed the above with Glade Boys, PA-C. She will send a refill of norco and methocarbamol , which they should use sparingly. However, if this is not helping or if he has new/worsening weakness, bowel/bladder issues, intractable pain, they will proceed to the ER. Mr and Mrs Brogden are agreeable to this plan.  [facility]

## 2023-05-18 NOTE — Telephone Encounter (Signed)
 PMP reviewed and is appropriate.   Refill of norco q 6 hours prn and robaxin given.   I agree with other recommendations.

## 2023-05-19 ENCOUNTER — Other Ambulatory Visit (HOSPITAL_COMMUNITY): Payer: Self-pay

## 2023-05-22 ENCOUNTER — Other Ambulatory Visit: Payer: Self-pay | Admitting: Family Medicine

## 2023-05-22 DIAGNOSIS — E785 Hyperlipidemia, unspecified: Secondary | ICD-10-CM

## 2023-05-23 ENCOUNTER — Other Ambulatory Visit (HOSPITAL_COMMUNITY): Payer: Self-pay

## 2023-05-29 ENCOUNTER — Ambulatory Visit (HOSPITAL_COMMUNITY)
Admission: RE | Admit: 2023-05-29 | Discharge: 2023-05-29 | Disposition: A | Discharge status: Home / Self Care | Payer: PPO | Source: Ambulatory Visit | Attending: Orthopedic Surgery | Admitting: Orthopedic Surgery

## 2023-05-29 DIAGNOSIS — M419 Scoliosis, unspecified: Secondary | ICD-10-CM | POA: Diagnosis not present

## 2023-05-29 DIAGNOSIS — M48061 Spinal stenosis, lumbar region without neurogenic claudication: Secondary | ICD-10-CM | POA: Diagnosis not present

## 2023-05-29 DIAGNOSIS — M47816 Spondylosis without myelopathy or radiculopathy, lumbar region: Secondary | ICD-10-CM | POA: Diagnosis not present

## 2023-05-29 DIAGNOSIS — Z9889 Other specified postprocedural states: Secondary | ICD-10-CM

## 2023-05-29 DIAGNOSIS — M5136 Other intervertebral disc degeneration, lumbar region with discogenic back pain only: Secondary | ICD-10-CM

## 2023-05-29 DIAGNOSIS — M5126 Other intervertebral disc displacement, lumbar region: Secondary | ICD-10-CM | POA: Diagnosis not present

## 2023-05-29 DIAGNOSIS — M4807 Spinal stenosis, lumbosacral region: Secondary | ICD-10-CM | POA: Diagnosis not present

## 2023-05-30 ENCOUNTER — Ambulatory Visit (INDEPENDENT_AMBULATORY_CARE_PROVIDER_SITE_OTHER): Payer: PPO

## 2023-05-30 DIAGNOSIS — I428 Other cardiomyopathies: Secondary | ICD-10-CM | POA: Diagnosis not present

## 2023-05-30 DIAGNOSIS — I5022 Chronic systolic (congestive) heart failure: Secondary | ICD-10-CM

## 2023-05-31 LAB — CUP PACEART REMOTE DEVICE CHECK
Battery Remaining Longevity: 48 mo
Battery Voltage: 2.97 V
Brady Statistic AP VP Percent: 0 %
Brady Statistic AP VS Percent: 0 %
Brady Statistic AS VP Percent: 2.77 %
Brady Statistic AS VS Percent: 97.23 %
Brady Statistic RA Percent Paced: 0 %
Brady Statistic RV Percent Paced: 3.24 %
Date Time Interrogation Session: 20250121022604
HighPow Impedance: 72 Ohm
Implantable Lead Connection Status: 753985
Implantable Lead Connection Status: 753985
Implantable Lead Implant Date: 20130502
Implantable Lead Implant Date: 20130502
Implantable Lead Location: 753859
Implantable Lead Location: 753860
Implantable Lead Model: 5076
Implantable Lead Model: 6935
Implantable Pulse Generator Implant Date: 20181031
Lead Channel Impedance Value: 342 Ohm
Lead Channel Impedance Value: 456 Ohm
Lead Channel Impedance Value: 513 Ohm
Lead Channel Pacing Threshold Amplitude: 0.75 V
Lead Channel Pacing Threshold Pulse Width: 0.4 ms
Lead Channel Sensing Intrinsic Amplitude: 3.125 mV
Lead Channel Sensing Intrinsic Amplitude: 3.125 mV
Lead Channel Setting Pacing Amplitude: 2.5 V
Lead Channel Setting Pacing Pulse Width: 0.4 ms
Lead Channel Setting Sensing Sensitivity: 0.6 mV
Zone Setting Status: 755011
Zone Setting Status: 755011

## 2023-06-12 ENCOUNTER — Ambulatory Visit (HOSPITAL_COMMUNITY): Payer: PPO

## 2023-06-16 ENCOUNTER — Other Ambulatory Visit (HOSPITAL_COMMUNITY): Payer: Self-pay

## 2023-06-16 MED FILL — Empagliflozin Tab 10 MG: ORAL | 90 days supply | Qty: 90 | Fill #0 | Status: CN

## 2023-06-19 ENCOUNTER — Other Ambulatory Visit: Payer: Self-pay

## 2023-06-19 ENCOUNTER — Other Ambulatory Visit (HOSPITAL_BASED_OUTPATIENT_CLINIC_OR_DEPARTMENT_OTHER): Payer: Self-pay

## 2023-06-19 ENCOUNTER — Ambulatory Visit: Payer: PPO | Admitting: Physician Assistant

## 2023-06-19 ENCOUNTER — Other Ambulatory Visit (HOSPITAL_COMMUNITY): Payer: Self-pay

## 2023-06-19 ENCOUNTER — Encounter: Payer: Self-pay | Admitting: Physician Assistant

## 2023-06-19 VITALS — BP 118/78 | Ht 66.0 in | Wt 194.6 lb

## 2023-06-19 DIAGNOSIS — M47816 Spondylosis without myelopathy or radiculopathy, lumbar region: Secondary | ICD-10-CM | POA: Diagnosis not present

## 2023-06-19 DIAGNOSIS — M5136 Other intervertebral disc degeneration, lumbar region with discogenic back pain only: Secondary | ICD-10-CM | POA: Diagnosis not present

## 2023-06-19 DIAGNOSIS — M48061 Spinal stenosis, lumbar region without neurogenic claudication: Secondary | ICD-10-CM | POA: Diagnosis not present

## 2023-06-19 DIAGNOSIS — Z9889 Other specified postprocedural states: Secondary | ICD-10-CM

## 2023-06-19 DIAGNOSIS — R9389 Abnormal findings on diagnostic imaging of other specified body structures: Secondary | ICD-10-CM

## 2023-06-19 MED FILL — Empagliflozin Tab 10 MG: ORAL | 90 days supply | Qty: 90 | Fill #0 | Status: AC

## 2023-06-19 NOTE — Progress Notes (Signed)
Follow-up note: Referring Physician:  Alba Cory, MD [address] [place],  [place] [address]  Primary Physician:  Alba Cory, MD  Chief Complaint:  Low back pain  History of Present Illness: Chris Price is a 83 y.o. male who has a history of essential tremor, lower extremity neuropathy, CAD, HTN, heart failure, obesity, hyperlipidemia, OSA with CPAP, BPH, afib.  History of L2-S1 lumbar decompression in 2004.  Patient was having constant right-sided low back pain with associated right lower extremity weakness and severe back spasms that was worse with standing and walking.  He does have baseline neuropathy.  He was given prednisone and oxycodone as well as Robaxin today, he states that he is much improved since he was last seen approximately a month ago.  He is no longer in severe pain.  He feels as though his right leg weakness has improved tremendously.  He states that his numbness and tingling is no longer present that was new a couple of months ago.  He is currently only taking Tylenol in the morning and at night for pain.  He is walking without assistance.  No new trouble with bowel or bladder.  No saddle anesthesia.  Review of Systems:  A 10 point review of systems is negative, except for the pertinent positives and negatives detailed in the HPI.  Past Medical History: Past Medical History:  Diagnosis Date   Chronic HFrEF (heart failure with reduced ejection fraction) (HCC)    a. 08/2020 Echo: EF 30-35%, glob HK, mild LVH. Low nl RV fxn. Sev BAE. Mild MR. Mild-mod TR. Mild AI. Ao sclerosis.   Degenerative disk disease    lumbar   Disc herniation    GERD (gastroesophageal reflux disease)    Hyperlipidemia    Hypertension    ICD (implantable cardiac defibrillator) in place    a. 02/2017 gen change to MDT MRI compatible single lead device. Ser # [id number] H.   IVCD (intraventricular conduction defect)    Nonspecific QRS duration 122   Lumbar spinal  stenosis    Neuromuscular disorder (HCC)    neauropathy   / siatica   NICM (nonischemic cardiomyopathy) (HCC)    a. 07/2011 Echo: EF 25-30%-->s/p ICD; b. 02/2017 s/p ICD gen change; c. 08/2020 Echo: EF 30-35%.   Non-obstructive Coronary artery disease 08/15/2011   a. 08/2011 Cath: Moderate nonobstructive CAD; b. 07/2013 Cath: LM nl, LAD 85m, D1 50/84m, LCX 31m, RCA 30p, 50d-->Med rx; c. 08/2020 MV: EF 35%. No ischemia. Fixed apical defect.   Permanent atrial fibrillation (HCC)    Sleep apnea    uses cpap    Past Surgical History: Past Surgical History:  Procedure Laterality Date   CARDIAC CATHETERIZATION  08/15/2011   [facility]    CARDIAC CATHETERIZATION  07/09/2013   [facility]   CARDIOVERSION  08/2011   [facility]   CATARACT EXTRACTION Right    [facility]   CHOLECYSTECTOMY  08/2013   COLONOSCOPY WITH PROPOFOL N/A 07/30/2015   Procedure: COLONOSCOPY WITH PROPOFOL;  Surgeon: Christena Deem, MD;  Location: [facility] ENDOSCOPY;  Service: Endoscopy;  Laterality: N/A;   cooled thermotherapy  2007   Wolfe, complicated by incontinence   HERNIA REPAIR     ICD  09/08/2011   ICD GENERATOR CHANGEOUT N/A 03/08/2017   Procedure: ICD GENERATOR CHANGEOUT;  Surgeon: Duke Salvia, MD;  Location: [facility] INVASIVE CV LAB;  Service: Cardiovascular;  Laterality: N/A;   IMPLANTABLE CARDIOVERTER DEFIBRILLATOR IMPLANT  09/08/2011   Procedure: IMPLANTABLE CARDIOVERTER DEFIBRILLATOR  IMPLANT;  Surgeon: Duke Salvia, MD;  Location: [facility] CATH LAB;  Service: Cardiovascular;;   LUMBAR LAMINECTOMY     L2 through S1 with wide decompression of the thecal sac and nerve roots.   PROSTATE BIOPSY  2013    Allergies: Allergies as of 06/19/2023 - Review Complete 06/19/2023  Allergen Reaction Noted   Coreg [carvedilol] Other (See Comments) 04/04/2018   Flomax [tamsulosin hcl] Other (See Comments) 04/04/2018    Medications: Outpatient Encounter Medications as of 06/19/2023  Medication Sig   B Complex-Folic Acid (SUPER B  COMPLEX MAXI) TABS Take 1 tablet by mouth daily.   bisoprolol (ZEBETA) 5 MG tablet TAKE ONE TABLET BY MOUTH EVERY DAY   empagliflozin (JARDIANCE) 10 MG TABS tablet TAKE 1 TABLET BY MOUTH DAILY   ezetimibe (ZETIA) 10 MG tablet TAKE ONE TABLET BY MOUTH EVERY DAY   finasteride (PROSCAR) 5 MG tablet TAKE ONE TABLET BY MOUTH EVERY DAY   furosemide (LASIX) 20 MG tablet TAKE ONE TABLET BY MOUTH EVERY DAY   hydrochlorothiazide (MICROZIDE) 12.5 MG capsule Take 1 tablet (12.5 mg) by mouth once daily in the morning   lisinopril (ZESTRIL) 2.5 MG tablet TAKE ONE TABLET BY MOUTH DAILY   loratadine (CLARITIN) 10 MG tablet TAKE ONE TABLET BY MOUTH DAILY AS NEEDED FOR ALLERGIES   multivitamin (ONE-A-DAY MEN'S) TABS tablet Take 1 tablet by mouth daily.   nitroGLYCERIN (NITROSTAT) 0.4 MG SL tablet Place 1 tablet (0.4 mg total) under the tongue every 5 (five) minutes as needed for chest pain.   omeprazole (PRILOSEC) 40 MG capsule Take 1 capsule (40 mg total) by mouth daily before breakfast. (Patient taking differently: Take 40 mg by mouth daily as needed.)   QUEtiapine (SEROQUEL) 25 MG tablet TAKE 1 TABLET BY MOUTH AT BEDTIME   rosuvastatin (CRESTOR) 40 MG tablet TAKE ONE TABLET BY MOUTH EVERY DAY   triamcinolone ointment (KENALOG) 0.1 % Apply 1 application topically daily as needed.   vitamin C (ASCORBIC ACID) 500 MG tablet Take 500 mg by mouth daily.   XARELTO 20 MG TABS tablet TAKE 1 TABLET BY MOUTH DAILY WITH SUPPER   HYDROcodone-acetaminophen (NORCO) 5-325 MG tablet Take 1 tablet by mouth every 6 (six) hours as needed for severe pain (pain score 7-10). (Patient not taking: Reported on 06/19/2023)   methocarbamol (ROBAXIN) 500 MG tablet Take 1 tablet (500 mg total) by mouth every 8 (eight) hours as needed for muscle spasms. This can make you sleepy. (Patient not taking: Reported on 06/19/2023)   No facility-administered encounter medications on file as of 06/19/2023.    Social History: Social History    Tobacco Use   Smoking status: Never   Smokeless tobacco: Never  Vaping Use   Vaping status: Never Used  Substance Use Topics   Alcohol use: No   Drug use: No    Family Medical History: Family History  Problem Relation Age of Onset   Heart attack Father    Heart attack Brother    Ovarian cancer Daughter    Heart attack Son     Exam: @VITALWITHPAIN @  General: Well-appearing.  Some mild tenderness to palpation of his lumbar paraspinals.  Strength in the left lower extremity is EHL 5/5, Dorsiflexion 5/5, Plantar flexion 5/5, Hamstring 5/5, Quadricep 5/5, Iliopsoas 5/5. Strength in the right lower extremity is EHL 5/5, Dorsiflexion 5/5, Plantar flexion 5/5, Hamstring 5/5, Quadricep 5/5, Iliopsoas 5/5. Reflexes are 2+ and symmetric at the patella and achilles.   Bilateral lower extremity sensation is intact  to light touch.  Clonus is negative.  He is now ambulating without an assistive device.  Imaging: MRI Lumbar spine 06/06/23: IMPRESSION: 1. Previous L2-L3 through L5-S1 posterior decompression. Widespread chronic vertebral endplate spurring and Smalls nodes. Thoracolumbar junction interbody ankylosis. And possible developing interbody ankylosis also at T11-T12 and L1-L2. Degenerative appearing marrow edema at the latter.   2. L1-L2 adjacent segment multifactorial spinal stenosis is Moderate, with mild mass effect at the tip of the conus but no conus signal abnormality.   3. Residual L5-S1 Mild spinal and Moderate bilateral lateral recess stenosis (S1 nerve levels). Up to severe bilateral L4 neural foraminal stenosis. And moderate bilateral L3 and L5 neural foraminal stenosis.   I have personally reviewed the images and agree with the above interpretation.  Assessment and Plan: Mr. Alois Cliche is a pleasant 83 y.o. male who has a history of essential tremor, lower extremity neuropathy, CAD, HTN, heart failure, obesity, hyperlipidemia, OSA with CPAP, BPH, afib.  History of  L2-S1 lumbar decompression in 2004.  Patient was having constant right-sided low back pain with associated right lower extremity weakness and severe back spasms that was worse with standing and walking.  He does have baseline neuropathy.  He was given prednisone and oxycodone as well as Robaxin today, he states that he is much improved since he was last seen approximately a month ago.  He is no longer in severe pain.  He feels as though his right leg weakness has improved tremendously.  He states that his numbness and tingling is no longer present that was new a couple of months ago.  He is currently only taking Tylenol in the morning and at night for pain.  He is walking without assistance.  No new trouble with bowel or bladder.  No saddle anesthesia.  Exam is improved.   We discussed his lumbar MRI at length.  He does have significant findings, but patient feels as though he is improving tremendously and with his pain manageable with Tylenol and his now ability to ambulate without assistance and no present weakness, he would like to continue as is.  He understands there are risks.  Red flag symptoms were reviewed at length in which he was told to report to the nearest emergency department.  If he were to decide to proceed with surgery at some point patient does have significant comorbidities and would likely need cardiology clearance.  He is encouraged to reach out to [place] for questions or concerns in the future as needed.  Joan Flores PA-C Neurosurgery

## 2023-06-20 ENCOUNTER — Other Ambulatory Visit (HOSPITAL_COMMUNITY): Payer: Self-pay

## 2023-06-20 DIAGNOSIS — D225 Melanocytic nevi of trunk: Secondary | ICD-10-CM | POA: Diagnosis not present

## 2023-06-20 DIAGNOSIS — L820 Inflamed seborrheic keratosis: Secondary | ICD-10-CM | POA: Diagnosis not present

## 2023-06-20 DIAGNOSIS — D2271 Melanocytic nevi of right lower limb, including hip: Secondary | ICD-10-CM | POA: Diagnosis not present

## 2023-06-20 DIAGNOSIS — L821 Other seborrheic keratosis: Secondary | ICD-10-CM | POA: Diagnosis not present

## 2023-06-20 DIAGNOSIS — Z85828 Personal history of other malignant neoplasm of skin: Secondary | ICD-10-CM | POA: Diagnosis not present

## 2023-06-20 DIAGNOSIS — D2272 Melanocytic nevi of left lower limb, including hip: Secondary | ICD-10-CM | POA: Diagnosis not present

## 2023-06-20 DIAGNOSIS — D485 Neoplasm of uncertain behavior of skin: Secondary | ICD-10-CM | POA: Diagnosis not present

## 2023-06-20 DIAGNOSIS — D2261 Melanocytic nevi of right upper limb, including shoulder: Secondary | ICD-10-CM | POA: Diagnosis not present

## 2023-06-20 DIAGNOSIS — Z08 Encounter for follow-up examination after completed treatment for malignant neoplasm: Secondary | ICD-10-CM | POA: Diagnosis not present

## 2023-06-20 DIAGNOSIS — D2262 Melanocytic nevi of left upper limb, including shoulder: Secondary | ICD-10-CM | POA: Diagnosis not present

## 2023-07-04 ENCOUNTER — Other Ambulatory Visit (HOSPITAL_COMMUNITY): Payer: Self-pay

## 2023-07-04 ENCOUNTER — Telehealth: Payer: Self-pay | Admitting: Internal Medicine

## 2023-07-04 ENCOUNTER — Other Ambulatory Visit: Payer: Self-pay

## 2023-07-04 DIAGNOSIS — I428 Other cardiomyopathies: Secondary | ICD-10-CM

## 2023-07-04 MED ORDER — RIVAROXABAN 20 MG PO TABS
20.0000 mg | ORAL_TABLET | Freq: Every day | ORAL | 1 refills | Status: DC
Start: 2023-07-04 — End: 2024-03-16
  Filled 2023-07-04: qty 90, 90d supply, fill #0
  Filled 2023-12-22: qty 90, 90d supply, fill #1

## 2023-07-04 NOTE — Telephone Encounter (Signed)
 Prescription refill request for Xarelto received.  Indication: AF Last office visit: 07/26/22  Odessa Fleming MD Weight: 93.4kg Age: 83 Scr: 0.88 on 02/20/23  Epic CrCl:  82.55  Based on above findings Xarelto 20mg  daily is the appropriate dose.  Refill approved.

## 2023-07-04 NOTE — Telephone Encounter (Signed)
*  STAT* If patient is at the pharmacy, call can be transferred to refill team.   1. Which medications need to be refilled? (please list name of each medication and dose if known)   XARELTO 20 MG TABS tablet   2. Would you like to learn more about the convenience, safety, & potential cost savings by using the [facility]?   3. Are you open to using the [facility] (Type [facility]. ).   4. Which pharmacy/location (including street and city if local pharmacy) is medication to be sent to?  Chris Price - [facility]   5. Do they need a 30 day or 90 day supply?   90 day   Wife [name]) stated patient still has some medication.

## 2023-07-05 ENCOUNTER — Encounter: Payer: Self-pay | Admitting: Internal Medicine

## 2023-07-10 ENCOUNTER — Telehealth: Payer: Self-pay | Admitting: Family Medicine

## 2023-07-10 ENCOUNTER — Ambulatory Visit (INDEPENDENT_AMBULATORY_CARE_PROVIDER_SITE_OTHER): Payer: PPO | Admitting: Nurse Practitioner

## 2023-07-10 ENCOUNTER — Encounter: Payer: Self-pay | Admitting: Nurse Practitioner

## 2023-07-10 VITALS — BP 122/78 | HR 92 | Resp 18 | Ht 66.0 in | Wt 199.5 lb

## 2023-07-10 DIAGNOSIS — H6123 Impacted cerumen, bilateral: Secondary | ICD-10-CM | POA: Diagnosis not present

## 2023-07-10 NOTE — Telephone Encounter (Signed)
 Return for AWV w/ nurse.

## 2023-07-10 NOTE — Progress Notes (Signed)
 BP 122/78   Pulse 92   Resp 18   Ht 5\' 6"  (1.676 m)   Wt 199 lb 8 oz (90.5 kg)   SpO2 98%   BMI 32.20 kg/m    Subjective:    Patient ID: Chris Price, male    DOB: [birth year], 83 y.o.   MRN: [id number]  HPI: Chris Price is a 83 y.o. male  Chief Complaint  Patient presents with   Ear Fullness    Micah Flesher got new batteries for hearring aids and needs clean    Discussed the use of AI scribe software for clinical note transcription with the patient, who gave verbal consent to proceed.  History of Present Illness   The patient, with a history of hearing aid use, presents with ear fullness. He was advised by his hearing aid provider that there was a significant amount of debris in his ears, potentially obstructing sound transmission. The patient has had his ears cleaned once before at the eye doctor's office where he purchased his hearing aids.     Verbal consent given Possible side effects discussed with patient Ears were  lavaged with warm water and peroxide  Patient tolerated procedure well No complications       02/20/2023    9:52 AM 08/08/2022    9:15 AM 07/07/2022   11:05 AM  Depression screen PHQ 2/9  Decreased Interest 0 0 0  Down, Depressed, Hopeless 0 0 0  PHQ - 2 Score 0 0 0  Altered sleeping 0 0   Tired, decreased energy 0 0   Change in appetite 0 0   Feeling bad or failure about yourself  0 0   Trouble concentrating 0 0   Moving slowly or fidgety/restless 0 0   Suicidal thoughts 0 0   PHQ-9 Score 0 0     Relevant past medical, surgical, family and social history reviewed and updated as indicated. Interim medical history since our last visit reviewed. Allergies and medications reviewed and updated.  Review of Systems  Ten systems reviewed and is negative except as mentioned in HPI      Objective:    BP 122/78   Pulse 92   Resp 18   Ht 5\' 6"  (1.676 m)   Wt 199 lb 8 oz (90.5 kg)   SpO2 98%   BMI 32.20 kg/m    Wt Readings from Last 3  Encounters:  07/10/23 199 lb 8 oz (90.5 kg)  06/19/23 194 lb 9.6 oz (88.3 kg)  05/11/23 195 lb (88.5 kg)    Physical Exam Vitals reviewed.  Constitutional:      Appearance: Normal appearance.  HENT:     Head: Normocephalic.     Right Ear: There is impacted cerumen.     Left Ear: There is impacted cerumen.  Cardiovascular:     Rate and Rhythm: Normal rate.  Pulmonary:     Effort: Pulmonary effort is normal.  Neurological:     General: No focal deficit present.     Mental Status: He is alert and oriented to person, place, and time. Mental status is at baseline.  Psychiatric:        Mood and Affect: Mood normal.        Behavior: Behavior normal.        Thought Content: Thought content normal.        Judgment: Judgment normal.    TMs clear after ear lavage  Results for orders placed or performed in visit on  05/30/23  CUP PACEART REMOTE DEVICE CHECK   Collection Time: 05/30/23  2:26 AM  Result Value Ref Range   Date Time Interrogation Session [phone]    Pulse Generator Manufacturer MERM    Pulse Gen Model [id number] Evera MRI XT DR    Pulse Gen Serial Number [id number]    Clinic Name [facility]    Implantable Pulse Generator Type Implantable Cardiac Defibulator    Implantable Pulse Generator Implant Date [id number]    Implantable Lead Manufacturer MERM    Implantable Lead Model 5076 CapSureFix Novus    Implantable Lead Serial Number [id number]    Implantable Lead Implant Date 84132440    Implantable Lead Location Detail 1 APPENDAGE    Implantable Lead Location [id number]    Implantable Lead Connection Status [id number]    Implantable Lead Manufacturer [facility]    Implantable Lead Model 6935 Sprint Quattro Secure S    Implantable Lead Serial Number [id number]    Implantable Lead Implant Date 10272536    Implantable Lead Location Detail 1 APEX    Implantable Lead Location [id number]    Implantable Lead Connection Status L088196    Lead Channel Setting Sensing Sensitivity 0.6 mV    Lead Channel Setting Pacing Pulse Width 0.4 ms   Lead Channel Setting Pacing Amplitude 2.5 V   Zone Setting Status Active    Zone Setting Status Inactive    Zone Setting Status Active    Zone Setting Status 755011    Zone Setting Status [phone]    Lead Channel Impedance Value 456 ohm   Lead Channel Impedance Value 513 ohm   Lead Channel Impedance Value 342 ohm   Lead Channel Sensing Intrinsic Amplitude 3.125 mV   Lead Channel Sensing Intrinsic Amplitude 3.125 mV   Lead Channel Pacing Threshold Amplitude 0.75 V   Lead Channel Pacing Threshold Pulse Width 0.4 ms   HighPow Impedance 72 ohm   Battery Status OK    Battery Remaining Longevity 48 mo   Battery Voltage 2.97 V   Brady Statistic RA Percent Paced 0 %   Brady Statistic RV Percent Paced 3.24 %   Brady Statistic AP VP Percent 0 %   Brady Statistic AS VP Percent 2.77 %   Brady Statistic AP VS Percent 0 %   Brady Statistic AS VS Percent 97.23 %       Assessment & Plan:   Problem List Items Addressed This Visit   None Visit Diagnoses       Impacted cerumen of both ears    -  Primary   Relevant Orders   Ear Lavage        Assessment and Plan    Cerumen impaction Cerumen impaction in both ears potentially affecting hearing. - Perform ear irrigation to remove cerumen from both ears.        Follow up plan: Return for AWV w/ nurse.

## 2023-07-11 NOTE — Progress Notes (Signed)
 Remote ICD transmission.

## 2023-07-11 NOTE — Addendum Note (Signed)
 Addended by: Geralyn Flash D on: 07/11/2023 04:06 PM   Modules accepted: Orders

## 2023-07-11 NOTE — Telephone Encounter (Signed)
 Patient already has appointment for AWV on 07/13/2023 at 10:50.

## 2023-07-12 ENCOUNTER — Telehealth: Payer: Self-pay | Admitting: *Deleted

## 2023-07-12 NOTE — Telephone Encounter (Signed)
   Pre-operative Risk Assessment    Patient Name: Chris Price  DOB: 09-Mar-1941 MRN: [id number]   Date of last office visit: 07/26/22 DR. KLEIN Date of next office visit: NONE  Request for Surgical Clearance    Procedure:  Dental Extraction - Amount of Teeth to be Pulled:  2 TEETH SURGICALLY EXTRACTED AS WELL THE PT WILL HAVE ON THE SAME DAY 1 FILLING AND 1 CROWN  Date of Surgery:  Clearance TBD                                Surgeon:  DR. Debria Garret, DDS Surgeon's Group or Practice Name:  [facility] Phone number:  [phone] Fax number:  [phone]   Type of Clearance Requested:   - Medical  - Pharmacy:  Hold Rivaroxaban (Xarelto)     Type of Anesthesia:  Local    Additional requests/questions:    Elpidio Anis   07/12/2023, 9:00 AM

## 2023-07-12 NOTE — Telephone Encounter (Signed)
   Patient Name: Chris Price  DOB: 1941/03/07 MRN: [id number]  Primary Cardiologist: Sherryl Manges, MD  Chart reviewed as part of pre-operative protocol coverage.   IF SIMPLE EXTRACTION/CLEANINGS/FILLINGS/CROWNS: Simple dental extractions (i.e. 1-2 teeth) are considered low risk procedures per guidelines and generally do not require any specific cardiac clearance. It is also generally accepted that for simple extractions and dental cleanings, there is no need to interrupt blood thinner therapy.  SBE prophylaxis is not required for the patient from a cardiac standpoint.  I will route this recommendation to the requesting party via Epic fax function and remove from pre-op pool.  Please call with questions.  Denyce Robert, NP 07/12/2023, 9:42 AM

## 2023-07-13 ENCOUNTER — Ambulatory Visit (INDEPENDENT_AMBULATORY_CARE_PROVIDER_SITE_OTHER): Payer: PPO

## 2023-07-13 VITALS — BP 134/76 | Ht 66.0 in | Wt 201.2 lb

## 2023-07-13 DIAGNOSIS — Z0000 Encounter for general adult medical examination without abnormal findings: Secondary | ICD-10-CM

## 2023-07-13 NOTE — Progress Notes (Signed)
 Subjective:   Chris Price is a 83 y.o. who presents for a Medicare Wellness preventive visit.  Visit Complete: In person   AWV Questionnaire: No: Patient Medicare AWV questionnaire was not completed prior to this visit.  Cardiac Risk Factors include: advanced age (>80men, >36 women);male gender;hypertension;dyslipidemia;obesity (BMI >30kg/m2);sedentary lifestyle     Objective:    Today's Vitals   07/13/23 1042 07/13/23 1047  BP: 134/76   Weight: 201 lb 3.2 oz (91.3 kg)   Height: 5\' 6"  (1.676 m)   PainSc:  3    Body mass index is 32.47 kg/m.     07/13/2023   10:52 AM 07/07/2022   11:07 AM 03/03/2022    1:11 PM 06/10/2021    8:29 AM 05/21/2019   10:09 AM 05/15/2018   11:06 AM 05/12/2017    2:24 PM  Advanced Directives  Does Patient Have a Medical Advance Directive? Yes Yes Yes Yes Yes Yes Yes  Type of Estate agent of [place];Living will  Living will Healthcare Power of [place];Living will Healthcare Power of [place];Living will Living will;Healthcare Power of [employer] Power of [employer];Living will  Does patient want to make changes to medical advance directive? No - Patient declined     No - Patient declined No - Patient declined  Copy of Healthcare Power of Attorney in Chart? Yes - validated most recent copy scanned in chart (See row information)   Yes - validated most recent copy scanned in chart (See row information) Yes - validated most recent copy scanned in chart (See row information) Yes - validated most recent copy scanned in chart (See row information) Yes    Current Medications (verified) Outpatient Encounter Medications as of 07/13/2023  Medication Sig   B Complex-Folic Acid (SUPER B COMPLEX MAXI) TABS Take 1 tablet by mouth daily.   bisoprolol (ZEBETA) 5 MG tablet TAKE ONE TABLET BY MOUTH EVERY DAY   empagliflozin (JARDIANCE) 10 MG TABS tablet TAKE 1 TABLET BY MOUTH DAILY   ezetimibe (ZETIA) 10 MG tablet TAKE ONE TABLET BY MOUTH  EVERY DAY   finasteride (PROSCAR) 5 MG tablet TAKE ONE TABLET BY MOUTH EVERY DAY   furosemide (LASIX) 20 MG tablet TAKE ONE TABLET BY MOUTH EVERY DAY   hydrochlorothiazide (MICROZIDE) 12.5 MG capsule Take 1 tablet (12.5 mg) by mouth once daily in the morning   lisinopril (ZESTRIL) 2.5 MG tablet TAKE ONE TABLET BY MOUTH DAILY   loratadine (CLARITIN) 10 MG tablet TAKE ONE TABLET BY MOUTH DAILY AS NEEDED FOR ALLERGIES   methocarbamol (ROBAXIN) 500 MG tablet Take 1 tablet (500 mg total) by mouth every 8 (eight) hours as needed for muscle spasms. This can make you sleepy.   multivitamin (ONE-A-DAY MEN'S) TABS tablet Take 1 tablet by mouth daily.   nitroGLYCERIN (NITROSTAT) 0.4 MG SL tablet Place 1 tablet (0.4 mg total) under the tongue every 5 (five) minutes as needed for chest pain.   omeprazole (PRILOSEC) 40 MG capsule Take 1 capsule (40 mg total) by mouth daily before breakfast. (Patient taking differently: Take 40 mg by mouth daily as needed.)   QUEtiapine (SEROQUEL) 25 MG tablet TAKE 1 TABLET BY MOUTH AT BEDTIME   rivaroxaban (XARELTO) 20 MG TABS tablet Take 1 tablet (20 mg total) by mouth daily with supper.   rosuvastatin (CRESTOR) 40 MG tablet TAKE ONE TABLET BY MOUTH EVERY DAY   triamcinolone ointment (KENALOG) 0.1 % Apply 1 application topically daily as needed.   vitamin C (ASCORBIC ACID) 500 MG  tablet Take 500 mg by mouth daily.   No facility-administered encounter medications on file as of 07/13/2023.    Allergies (verified) Coreg [carvedilol] and Flomax [tamsulosin hcl]   History: Past Medical History:  Diagnosis Date   Chronic HFrEF (heart failure with reduced ejection fraction) (HCC)    a. 08/2020 Echo: EF 30-35%, glob HK, mild LVH. Low nl RV fxn. Sev BAE. Mild MR. Mild-mod TR. Mild AI. Ao sclerosis.   Degenerative disk disease    lumbar   Disc herniation    GERD (gastroesophageal reflux disease)    Hyperlipidemia    Hypertension    ICD (implantable cardiac defibrillator) in  place    a. 02/2017 gen change to MDT MRI compatible single lead device. Ser # [id number].   IVCD (intraventricular conduction defect)    Nonspecific QRS duration 122   Lumbar spinal stenosis    Neuromuscular disorder (HCC)    neauropathy   / siatica   NICM (nonischemic cardiomyopathy) (HCC)    a. 07/2011 Echo: EF 25-30%-->s/p ICD; b. 02/2017 s/p ICD gen change; c. 08/2020 Echo: EF 30-35%.   Non-obstructive Coronary artery disease 08/15/2011   a. 08/2011 Cath: Moderate nonobstructive CAD; b. 07/2013 Cath: LM nl, LAD 34m, D1 50/32m, LCX 39m, RCA 30p, 50d-->Med rx; c. 08/2020 MV: EF 35%. No ischemia. Fixed apical defect.   Permanent atrial fibrillation [facility])    Sleep apnea    uses cpap   Past Surgical History:  Procedure Laterality Date   CARDIAC CATHETERIZATION  08/15/2011   [facility]    CARDIAC CATHETERIZATION  07/09/2013   [facility]   CARDIOVERSION  08/2011   [facility]   CATARACT EXTRACTION Right    [facility]   CHOLECYSTECTOMY  08/2013   COLONOSCOPY WITH PROPOFOL N/A 07/30/2015   Procedure: COLONOSCOPY WITH PROPOFOL;  Surgeon: Christena Deem, MD;  Location: [facility] ENDOSCOPY;  Service: Endoscopy;  Laterality: N/A;   cooled thermotherapy  2007   [place], complicated by incontinence   HERNIA REPAIR     ICD  09/08/2011   ICD GENERATOR CHANGEOUT N/A 03/08/2017   Procedure: ICD GENERATOR CHANGEOUT;  Surgeon: Duke Salvia, MD;  Location: [facility] INVASIVE CV LAB;  Service: Cardiovascular;  Laterality: N/A;   IMPLANTABLE CARDIOVERTER DEFIBRILLATOR IMPLANT  09/08/2011   Procedure: IMPLANTABLE CARDIOVERTER DEFIBRILLATOR IMPLANT;  Surgeon: Duke Salvia, MD;  Location: [facility] CATH LAB;  Service: Cardiovascular;;   LUMBAR LAMINECTOMY     L2 through S1 with wide decompression of the thecal sac and nerve roots.   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   Tobacco Use   Smoking status: Never   Smokeless tobacco: Never  Vaping Use   Vaping status: Never Used  Substance and Sexual Activity   Alcohol use: No   Drug use: No   Sexual activity: Not Currently  Other Topics Concern   Not on file  Social History Narrative   Second marriage, together since 1980   He has 3 children from previous marriage  and one step-daughter    Retired    Right handed   Social Drivers of Corporate investment banker Strain: Low Risk  (07/13/2023)   Overall Financial Resource Strain (CARDIA)    Difficulty of Paying Living Expenses: Not hard at all  Food Insecurity: No Food Insecurity (07/13/2023)   Hunger Vital Sign    Worried About Running Out of Food in the Last Year: Never true    Ran Out of Food in the Last Year: Never true  Transportation Needs: No Transportation Needs (07/13/2023)   PRAPARE - Administrator, Civil Service (Medical): No    Lack of Transportation (Non-Medical): No  Physical Activity: Inactive (07/13/2023)   Exercise Vital Sign    Days of Exercise per Week: 0 days    Minutes of Exercise per Session: 0 min  Stress: No Stress Concern Present (07/13/2023)   [employer] of Occupational Health - Occupational Stress Questionnaire    Feeling of Stress : Not at all  Social Connections: Moderately Isolated (07/13/2023)   Social Connection and Isolation Panel [NHANES]    Frequency of Communication with Friends and Family: Three times a week    Frequency of Social Gatherings with Friends and Family: Once a week    Attends Religious Services: Never    Database administrator or Organizations: No    Attends Engineer, structural: Never    Marital Status: Married    Tobacco  Counseling Counseling given: Not Answered    Clinical Intake:  Pre-visit preparation completed: Yes  Pain : 0-10 Pain Score: 3  Pain Type: Chronic pain Pain Location: Back Pain Orientation: Lower Pain Descriptors / Indicators: Aching, Throbbing, Constant Pain Onset: More than a month ago Pain Frequency: Constant Pain Relieving Factors: TYLENOL 600MG , MUSCLE RELAXER, HYDROCODONE  Pain Relieving Factors: TYLENOL 600MG , MUSCLE RELAXER, HYDROCODONE  BMI - recorded: 32.47 Nutritional Status: BMI > 30  Obese Nutritional Risks: None Diabetes: No  How often do you need to have someone help you when you read instructions, pamphlets, or other written materials from your doctor or pharmacy?: 1 - Never  Interpreter Needed?: No  Information entered by :: Kennedy Bucker, LPN   Activities of Daily Living     07/13/2023   10:55 AM 07/10/2023    8:36 AM  In your present state of health, do you have any difficulty performing the following activities:  Hearing? 1 0  Vision? 0 0  Difficulty concentrating or making decisions? 1 1  Walking or climbing stairs? 1 0  Comment BACK PAIN   Dressing or bathing? 0 0  Doing errands, shopping? 0 0  Preparing Food and eating ? N N  Using the Toilet? N N  In the past six months, have you accidently leaked urine? N N  Do you have problems with loss of bowel control? N N  Managing your Medications? N N  Managing your Finances? N N  Housekeeping or managing your Housekeeping? N N    Patient Care Team: Alba Cory, MD as PCP - General (Family Medicine) Duke Salvia, MD as PCP - Cardiology (Cardiology) Duke Salvia, MD as Consulting Physician (Cardiology) Vanna Scotland, MD as Consulting Physician (Urology) Tat, Octaviano Batty, DO as Consulting Physician (Neurology)  Indicate any recent Medical Services you may have received from other than [facility] providers in the past year (date may be approximate).     Assessment:   This is a routine  wellness examination for Javoni.  Hearing/Vision screen  Hearing Screening - Comments:: WEARS AIDS, BOTH EARS Vision Screening - Comments:: WEARS GLASSES ALL THE TIME- MD IN [place]   Goals Addressed             This Visit's Progress    DIET - EAT MORE FRUITS AND VEGETABLES         Depression Screen     07/13/2023   10:51 AM 02/20/2023    9:52 AM 08/08/2022    9:15 AM 07/07/2022   11:05 AM 02/08/2022    8:18 AM 01/11/2022    3:29 PM 12/21/2021    2:26 PM  PHQ 2/9 Scores  PHQ - 2 Score 0 0 0 0 0 0 0  PHQ- 9 Score 0 0 0  0 0 0    Fall Risk     07/13/2023   10:55 AM 07/10/2023    2:07 PM 07/10/2023    8:36 AM 02/20/2023    9:52 AM 08/08/2022    9:15 AM  Fall Risk   Falls in the past year? 0 0 0 0 0  Number falls in past yr: 0 0   0  Injury with Fall? 0 0   0  Risk for fall due to : No Fall Risks   No Fall Risks No Fall Risks  Follow up Falls prevention discussed;Falls evaluation completed Falls evaluation completed  Falls prevention discussed Falls prevention discussed    MEDICARE RISK AT HOME:  Medicare Risk at Home Any stairs in or around the home?: Yes If so, are there any without handrails?: No Home free of loose throw rugs in walkways, pet beds, electrical cords, etc?: Yes Adequate lighting in your home to reduce risk of falls?: Yes Life alert?: No Use of a cane, walker or w/c?: No Grab bars in the bathroom?: Yes Shower chair or bench in shower?: No Elevated toilet seat or a handicapped toilet?: No  TIMED UP AND GO:  Was the test performed?  Yes  Length of time to ambulate 10 feet: 4 sec Gait steady and fast without use of assistive device  Cognitive Function: 6CIT completed    06/07/2021    9:33 AM 05/12/2016    2:27 PM 05/13/2015    3:08 PM  MMSE - Mini Mental State Exam  Orientation to time 3 5 5   Orientation to Place 4 5 5   Registration 3 3 3   Attention/ Calculation 5 5 5   Recall 0 3 3  Language- name 2 objects 2 2 2   Language- repeat 1 1 1   Language-  follow 3 step command 3 3 3   Language- read & follow direction 1 1 1   Write a sentence 1 1 1   Copy design 0 1 1  Total score 23 30 30         07/13/2023   10:57 AM 07/07/2022   11:10 AM 06/09/2020    8:15 AM 05/21/2019   10:13 AM 05/15/2018   11:15 AM  6CIT Screen  What Year? 0 points 0 points 0 points 0 points 0 points  What month? 0 points 0 points 0 points 0 points 0 points  What time? 0 points 0 points 0 points 0 points 0 points  Count back from 20 0 points 0 points 0 points 0 points 0 points  Months in reverse 0 points 0 points 0 points 0 points 2 points  Repeat phrase 8 points 4 points 6 points 4 points 2 points  Total Score 8 points 4 points 6 points 4 points 4 points  Immunizations Immunization History  Administered Date(s) Administered   Fluad Quad(high Dose 65+) 01/25/2019, 01/31/2020, 02/08/2021, 02/08/2022   Fluad Trivalent(High Dose 65+) 02/20/2023   Influenza Split 02/19/2012   Influenza-Unspecified 02/08/2013, 02/09/2016, 02/20/2017, 02/02/2018   Moderna Covid-19 Fall Seasonal Vaccine 67yrs & older 05/11/2022, 02/20/2023   PFIZER(Purple Top)SARS-COV-2 Vaccination 06/03/2019, 06/24/2019, 02/14/2020, 08/07/2020, 02/25/2021   Pneumococcal Conjugate-13 05/14/2015   Pneumococcal Polysaccharide-23 03/21/2012   Respiratory Syncytial Virus Vaccine,Recomb Aduvanted(Arexvy) 03/01/2022   Tdap 02/28/2011, 03/28/2011, 05/25/2023   Zoster Recombinant(Shingrix) 10/29/2019, 12/31/2019    Screening Tests Health Maintenance  Topic Date Due   COVID-19 Vaccine (8 - 2024-25 season) 07/26/2023 (Originally 04/17/2023)   Medicare Annual Wellness (AWV)  07/12/2024   DTaP/Tdap/Td (4 - Td or Tdap) 05/24/2033   Pneumonia Vaccine 33+ Years old  Completed   INFLUENZA VACCINE  Completed   Zoster Vaccines- Shingrix  Completed   HPV VACCINES  Aged Out    Health Maintenance  There are no preventive care reminders to display for this patient. Health Maintenance Items Addressed: UP TO  DATE  Additional Screening:  Vision Screening: Recommended annual ophthalmology exams for early detection of glaucoma and other disorders of the eye.  Dental Screening: Recommended annual dental exams for proper oral hygiene  Community Resource Referral / Chronic Care Management: CRR required this visit?  No   CCM required this visit?  No     Plan:     I have personally reviewed and noted the following in the patient's chart:   Medical and social history Use of alcohol, tobacco or illicit drugs  Current medications and supplements including opioid prescriptions. Patient is currently taking opioid prescriptions. Information provided to patient regarding non-opioid alternatives. Patient advised to discuss non-opioid treatment plan with their provider. Functional ability and status Nutritional status Physical activity Advanced directives List of other physicians Hospitalizations, surgeries, and ER visits in previous 12 months Vitals Screenings to include cognitive, depression, and falls Referrals and appointments  In addition, I have reviewed and discussed with patient certain preventive protocols, quality metrics, and best practice recommendations. A written personalized care plan for preventive services as well as general preventive health recommendations were provided to patient.     Hal Hope, LPN   05/14/1094   After Visit Summary: (In Person-Declined) Patient declined AVS at this time.  Notes: Nothing significant to report at this time.

## 2023-07-13 NOTE — Patient Instructions (Addendum)
 [name] , Thank you for taking time to come for your Medicare Wellness Visit. I appreciate your ongoing commitment to your health goals. Please review the following plan we discussed and let me know if I can assist you in the future.   Referrals/Orders/Follow-Ups/Clinician Recommendations: NONE  This is a list of the screening recommended for you and due dates:  Health Maintenance  Topic Date Due   COVID-19 Vaccine (8 - 2024-25 season) 07/26/2023*   Medicare Annual Wellness Visit  07/12/2024   DTaP/Tdap/Td vaccine (4 - Td or Tdap) 05/24/2033   Pneumonia Vaccine  Completed   Flu Shot  Completed   Zoster (Shingles) Vaccine  Completed   HPV Vaccine  Aged Out  *Topic was postponed. The date shown is not the original due date.    Advanced directives: (In Chart) A copy of your advanced directives are scanned into your chart should your provider ever need it.  Next Medicare Annual Wellness Visit scheduled for next year: Yes   07/18/24 @ 10:10 AM IN PERSON

## 2023-07-15 ENCOUNTER — Other Ambulatory Visit: Payer: Self-pay | Admitting: Family Medicine

## 2023-07-15 ENCOUNTER — Other Ambulatory Visit: Payer: Self-pay | Admitting: Internal Medicine

## 2023-07-15 DIAGNOSIS — E785 Hyperlipidemia, unspecified: Secondary | ICD-10-CM

## 2023-07-15 DIAGNOSIS — I2510 Atherosclerotic heart disease of native coronary artery without angina pectoris: Secondary | ICD-10-CM

## 2023-08-29 ENCOUNTER — Ambulatory Visit: Payer: PPO

## 2023-08-29 DIAGNOSIS — I428 Other cardiomyopathies: Secondary | ICD-10-CM | POA: Diagnosis not present

## 2023-08-29 LAB — CUP PACEART REMOTE DEVICE CHECK
Battery Remaining Longevity: 41 mo
Battery Voltage: 2.97 V
Brady Statistic AP VP Percent: 0 %
Brady Statistic AP VS Percent: 0 %
Brady Statistic AS VP Percent: 4.13 %
Brady Statistic AS VS Percent: 95.87 %
Brady Statistic RA Percent Paced: 0 %
Brady Statistic RV Percent Paced: 3.58 %
Date Time Interrogation Session: 20250422033326
HighPow Impedance: 62 Ohm
Implantable Lead Connection Status: 753985
Implantable Lead Connection Status: 753985
Implantable Lead Implant Date: 20130502
Implantable Lead Implant Date: 20130502
Implantable Lead Location: 753859
Implantable Lead Location: 753860
Implantable Lead Model: 5076
Implantable Lead Model: 6935
Implantable Pulse Generator Implant Date: 20181031
Lead Channel Impedance Value: 323 Ohm
Lead Channel Impedance Value: 456 Ohm
Lead Channel Impedance Value: 456 Ohm
Lead Channel Pacing Threshold Amplitude: 0.625 V
Lead Channel Pacing Threshold Pulse Width: 0.4 ms
Lead Channel Sensing Intrinsic Amplitude: 3.375 mV
Lead Channel Sensing Intrinsic Amplitude: 3.375 mV
Lead Channel Setting Pacing Amplitude: 2.5 V
Lead Channel Setting Pacing Pulse Width: 0.4 ms
Lead Channel Setting Sensing Sensitivity: 0.6 mV
Zone Setting Status: 755011
Zone Setting Status: 755011

## 2023-09-05 ENCOUNTER — Telehealth: Payer: Self-pay

## 2023-09-05 NOTE — Telephone Encounter (Signed)
 Spoke with pt's wife, DPR and advised per Dr Lawana Pray device remote check shows aborted VT and he recommends follow up with EP APP.  Appointment scheduled with [name], NP 09/06/23 at 230pm.  Pt's wife verbalizes understanding and agrees with current plan.

## 2023-09-05 NOTE — Telephone Encounter (Signed)
-----   Message from [facility] sent at 09/04/2023  4:46 PM EDT ----- Remote Defibrillator interrogation reviewed. Presenting Rhythm:A-sensed V-paced. Battery and lead parameters stable with stable capture and sensing. Device programming is appropriate. No arrhythmias noted. Continue remote monitoring. Aborted VT, needs ep app to discuss

## 2023-09-06 ENCOUNTER — Other Ambulatory Visit: Payer: Self-pay | Admitting: Internal Medicine

## 2023-09-06 ENCOUNTER — Ambulatory Visit: Attending: Cardiology | Admitting: Cardiology

## 2023-09-06 VITALS — BP 130/72 | HR 66 | Ht 66.5 in | Wt 196.0 lb

## 2023-09-06 DIAGNOSIS — I4821 Permanent atrial fibrillation: Secondary | ICD-10-CM | POA: Diagnosis not present

## 2023-09-06 DIAGNOSIS — I5020 Unspecified systolic (congestive) heart failure: Secondary | ICD-10-CM

## 2023-09-06 DIAGNOSIS — Z95810 Presence of automatic (implantable) cardiac defibrillator: Secondary | ICD-10-CM

## 2023-09-06 DIAGNOSIS — D6869 Other thrombophilia: Secondary | ICD-10-CM | POA: Diagnosis not present

## 2023-09-06 DIAGNOSIS — I472 Ventricular tachycardia, unspecified: Secondary | ICD-10-CM

## 2023-09-06 LAB — CUP PACEART INCLINIC DEVICE CHECK
Date Time Interrogation Session: 20250430163139
Implantable Lead Connection Status: 753985
Implantable Lead Connection Status: 753985
Implantable Lead Implant Date: 20130502
Implantable Lead Implant Date: 20130502
Implantable Lead Location: 753859
Implantable Lead Location: 753860
Implantable Lead Model: 5076
Implantable Lead Model: 6935
Implantable Pulse Generator Implant Date: 20181031

## 2023-09-06 NOTE — Patient Instructions (Addendum)
 Medication Instructions:   *call us  with what medications are home, and notify us  of any changes*   *If you need a refill on your cardiac medications before your next appointment, please call your pharmacy*  Lab Work: Your provider would like for you to have following labs drawn today CBC,BMET, MAG, BNP.    If you have labs (blood work) drawn today and your tests are completely normal, you will receive your results only by: MyChart Message (if you have MyChart) OR A paper copy in the mail If you have any lab test that is abnormal or we need to change your treatment, we will call you to review the results.  Testing/Procedures: CARDIAC PET- Your physician has requested that you have a Cardiac Pet Stress Test.   This testing is completed at [facility] ([address] [place], [place]). Please arrive 30 minutes prior to your scheduled time.  The schedulers will call you to get this scheduled. Please follow further testing instructions below.   Follow-Up: At [facility], you and your health needs are our priority.  As part of our continuing mission to provide you with exceptional heart care, our providers are all part of one team.  This team includes your primary Cardiologist (physician) and Advanced Practice Providers or APPs (Physician Assistants and Nurse Practitioners) who all work together to provide you with the care you need, when you need it.  Your next appointment:   1-2 week(s) AFTER CARDIAC PET STRESS TEST  Provider:   [name], NP OR MD (AT TIME OF NEEDED VISIT)   We recommend signing up for the patient portal called "MyChart".  Sign up information is provided on this After Visit Summary.  MyChart is used to connect with patients for Virtual Visits (Telemedicine).  Patients are able to view lab/test results, encounter notes, upcoming appointments, etc.  Non-urgent messages can be sent to your provider as well.   To learn more about  what you can do with MyChart, go to [email or web address].   Other Instructions   Please report to Radiology at [facility] Main Entrance, medical mall, 30 mins prior to your test.  [address]  [place], [place]  How to Prepare for Your Cardiac PET/CT Stress Test:  Nothing to eat or drink, except water, 3 hours prior to arrival time.  NO caffeine/decaffeinated products, or chocolate 12 hours prior to arrival. (Please note decaffeinated beverages (teas/coffees) still contain caffeine).  If you have caffeine within 12 hours prior, the test will need to be rescheduled.  Medication instructions: Do not take erectile dysfunction medications for 72 hours prior to test (sildenafil, tadalafil) Do not take nitrates (isosorbide mononitrate, Ranexa) the day before or day of test Do not take tamsulosin the day before or morning of test Hold theophylline containing medications for 12 hours. Hold Dipyridamole 48 hours prior to the test.  Diabetic Preparation: If able to eat breakfast prior to 3 hour fasting, you may take all medications, including your insulin. Do not worry if you miss your breakfast dose of insulin - start at your next meal. If you do not eat prior to 3 hour fast-Hold all diabetes (oral and insulin) medications. Patients who wear a continuous glucose monitor MUST remove the device prior to scanning.  You may take your remaining medications with water.  NO perfume, cologne or lotion on chest or abdomen area. FEMALES - Please avoid wearing dresses to this appointment.  Total time is 1 to 2  hours; you may want to bring reading material for the waiting time.  IF YOU THINK YOU MAY BE PREGNANT, OR ARE NURSING PLEASE INFORM THE TECHNOLOGIST.  In preparation for your appointment, medication and supplies will be purchased.  Appointment availability is limited, so if you need to cancel or reschedule, please call the Radiology Department Scheduler at  [phone] 24 hours in advance to avoid a cancellation fee of $100.00  What to Expect When you Arrive:  Once you arrive and check in for your appointment, you will be taken to a preparation room within the Radiology Department.  A technologist or Nurse will obtain your medical history, verify that you are correctly prepped for the exam, and explain the procedure.  Afterwards, an IV will be started in your arm and electrodes will be placed on your skin for EKG monitoring during the stress portion of the exam. Then you will be escorted to the PET/CT scanner.  There, staff will get you positioned on the scanner and obtain a blood pressure and EKG.  During the exam, you will continue to be connected to the EKG and blood pressure machines.  A small, safe amount of a radioactive tracer will be injected in your IV to obtain a series of pictures of your heart along with an injection of a stress agent.    After your Exam:  It is recommended that you eat a meal and drink a caffeinated beverage to counter act any effects of the stress agent.  Drink plenty of fluids for the remainder of the day and urinate frequently for the first couple of hours after the exam.  Your doctor will inform you of your test results within 7-10 business days.  For more information and frequently asked questions, please visit our website: [email or web address]  For questions about your test or how to prepare for your test, please call: Cardiac Imaging Nurse Navigators Office: [phone]

## 2023-09-06 NOTE — Progress Notes (Signed)
 Electrophysiology Clinic Note    Date:  09/06/2023  Patient ID:  Chris Price, Chris Price 1941/05/05, MRN [id number] PCP:  Arleen Lacer, MD  Cardiologist:  None Electrophysiologist: Richardo Chandler, MD   Discussed the use of AI scribe software for clinical note transcription with the patient, who gave verbal consent to proceed.   Patient Profile    Chief Complaint: VT follow-up  History of Present Illness: Chris Price is a 83 y.o. male with PMH notable for NICM, HFrEF, s/p ICD, bradycardia, perm AFib, VT, OSA on CPAP; seen today for Richardo Chandler, MD for acute visit due to aborted VT.    He last saw Dr. Rodolfo Clan 07/2022. Historically hypotension limited up-titration of GDMT (entresto  and spiro). Previously was on digoxin  but had increase in VP, so was stopped and bisoprolol  lowered to 5mg  daily.   Remote monitoring alerted clinic staff to VT/VF episode that occurred earlier this month on 4/4. He reports being completely unaware of this episode. He denies chest pain, chest pressure. He does have fatigue with DOE, like with walking trash cans to the curb but states this is not new or worsened. His wife manages his medications, and he takes a water pill every other day, but she is not aware of the name of the medication.  He has a great appetite. He does not appreciate any increase swelling or extra fluid.   He continues to take xarelto  daily, no bleeding concerns.     Arrhythmia/Device History MDT dual chamber ICD, imp 2013; HFrEF Gen change 02/2017  AAD - Amio (for AFib) - stopped d/t ineffective   ROS:  Please see the history of present illness. All other systems are reviewed and otherwise negative.    Physical Exam    VS:  BP 130/72 (BP Location: Left Arm, Patient Position: Sitting, Cuff Size: Normal)   Pulse 66   Ht 5' 6.5" (1.689 m)   Wt 196 lb (88.9 kg)   SpO2 94%   BMI 31.16 kg/m  BMI: Body mass index is 31.16 kg/m.  Wt Readings from Last 3 Encounters:   09/06/23 196 lb (88.9 kg)  07/13/23 201 lb 3.2 oz (91.3 kg)  07/10/23 199 lb 8 oz (90.5 kg)     GEN- The patient is well appearing, alert and oriented x 3 today.   Lungs- Clear to ausculation bilaterally, normal work of breathing.  Heart- Irregularly irregular rate and rhythm, no murmurs, rubs or gallops Extremities- Trace peripheral edema, warm, dry Skin-  device pocket well-healed, no tethering   Device interrogation done today and reviewed by myself:  Battery 3.3 Lead thresholds, impedence, sensing stable  Several NSVT episodes HF diagnostics rising  VT episode 4/4 labelled as VF bc fell into VF zone. Cycle length . Device appropriately labelled episode, charged, and then episode self-terminated prior to [facility] therapy    Studies Reviewed   Previous EP, cardiology notes.    EKG is ordered. Personal review of EKG from today shows:    EKG Interpretation Date/Time:  Wednesday September 06 2023 14:24:33 EDT Ventricular Rate:  66 PR Interval:    QRS Duration:  148 QT Interval:  444 QTC Calculation: 465 R Axis:   -80  Text Interpretation: Atrial fibrillation with occasional ventricular-paced complexes Left axis deviation Right bundle branch block Inferior infarct , age undetermined T wave abnormality, consider lateral ischemia Confirmed by Mehmet Scally [phone]) on 09/06/2023 3:42:19 PM     NM myocardial, 08/27/2020 Pharmacological myocardial perfusion imaging study with no  significant ischemia Small region fixed defect in the apical region LV is dilated Mild global hypokinesis, hypokinesis most notable in the septal wall ,  EF estimated at 35% CT attenuation correction images with three-vessel coronary calcification No EKG changes concerning for ischemia at peak stress or in recovery. Moderate risk scan  TTE, 08/21/2020 1. Left ventricular ejection fraction, by estimation, is 30 to 35%. The left ventricle has moderate to severely decreased function. The left ventricle  demonstrates global hypokinesis. There is mild left ventricular hypertrophy. Left ventricular diastolic parameters are indeterminate.   2. Right ventricular systolic function is low normal. The right ventricular size is normal.   3. Left atrial size was severely dilated.   4. Right atrial size was severely dilated.   5. The mitral valve is normal in structure. Mild mitral valve regurgitation.   6. Tricuspid valve regurgitation is mild to moderate.   7. The aortic valve is tricuspid. Aortic valve regurgitation is mild. Mild to moderate aortic valve sclerosis/calcification is present, without any evidence of aortic stenosis.     Assessment and Plan     #) VT, sustained #) HFrEF #) ICD in situ #) perm AFib #) LBBB Recent episode of 38 seconds of VT that spontaneously converted. Patient was asymptomatic of episode HF diagnostics elevated on ICD, though do not appreciate hypervolemia on exam Ischemic workup with myocardial PET Update BMP, Mag, BNP He has historically been symptomatic with higher bisprolol dose, so will continue 5mg  daily Patient's wife is unsure of medication list. I've asked her to call clinic with an updated list once she is able to review medication bottles. Anticipate increasing diuretic once dosage is known.  Continue xarelto  for stroke ppx Consider device upgrade in future  Close follow-up after Cardiac PET with either myself or EP MD (in Dr. Doyle Generous absence)    Informed Consent   Shared Decision Making/Informed Consent The risks [chest pain, shortness of breath, cardiac arrhythmias, dizziness, blood pressure fluctuations, myocardial infarction, stroke/transient ischemic attack, nausea, vomiting, allergic reaction, radiation exposure, metallic taste sensation and life-threatening complications (estimated to be 1 in 10,000)], benefits (risk stratification, diagnosing coronary artery disease, treatment guidance) and alternatives of a cardiac PET stress test were  discussed in detail with Chris Price and he agrees to proceed.      Current medicines are reviewed at length with the patient today.   The patient has concerns regarding his medicines.  The following changes were made today:  none at this time given unknown current medications  Labs/ tests ordered today include:  Orders Placed This Encounter  Procedures   NM PET CT CARDIAC PERFUSION MULTI W/ABSOLUTE BLOODFLOW   CBC   Basic metabolic panel with GFR   Magnesium    Brain natriuretic peptide   EKG 12-Lead     Disposition: Close follow-up after Cardiac PET with either myself or any EP MD (in Dr. Doyle Generous absence)   Signed, Byan Poplaski, NP  09/06/23  3:55 PM  Electrophysiology [facility]

## 2023-09-07 ENCOUNTER — Other Ambulatory Visit (HOSPITAL_COMMUNITY): Payer: Self-pay

## 2023-09-07 ENCOUNTER — Telehealth: Payer: Self-pay | Admitting: Cardiology

## 2023-09-07 ENCOUNTER — Other Ambulatory Visit: Payer: Self-pay

## 2023-09-07 LAB — BASIC METABOLIC PANEL WITH GFR
BUN/Creatinine Ratio: 16 (ref 10–24)
BUN: 16 mg/dL (ref 8–27)
CO2: 21 mmol/L (ref 20–29)
Calcium: 9.6 mg/dL (ref 8.6–10.2)
Chloride: 107 mmol/L — ABNORMAL HIGH (ref 96–106)
Creatinine, Ser: 0.98 mg/dL (ref 0.76–1.27)
Glucose: 79 mg/dL (ref 70–99)
Potassium: 5 mmol/L (ref 3.5–5.2)
Sodium: 146 mmol/L — ABNORMAL HIGH (ref 134–144)
eGFR: 76 mL/min/{1.73_m2} (ref >59)

## 2023-09-07 LAB — CBC
Hematocrit: 49.5 % (ref 37.5–51.0)
Hemoglobin: 16.3 g/dL (ref 13.0–17.7)
MCH: 32.7 pg (ref 26.6–33.0)
MCHC: 32.9 g/dL (ref 31.5–35.7)
MCV: 99 fL — ABNORMAL HIGH (ref 79–97)
Platelets: 155 x10E3/uL (ref 150–450)
RBC: 4.99 x10E6/uL (ref 4.14–5.80)
RDW: 13.1 % (ref 11.6–15.4)
WBC: 5.5 x10E3/uL (ref 3.4–10.8)

## 2023-09-07 LAB — MAGNESIUM: Magnesium: 2.2 mg/dL (ref 1.6–2.3)

## 2023-09-07 LAB — BRAIN NATRIURETIC PEPTIDE: BNP: 282.5 pg/mL — ABNORMAL HIGH (ref 0.0–100.0)

## 2023-09-07 MED ORDER — EMPAGLIFLOZIN 10 MG PO TABS
10.0000 mg | ORAL_TABLET | Freq: Every day | ORAL | 3 refills | Status: AC
  Filled 2023-09-07: qty 90, 90d supply, fill #0
  Filled 2023-12-22: qty 90, 90d supply, fill #1
  Filled 2024-03-16: qty 90, 90d supply, fill #2
  Filled 2024-06-10: qty 90, 90d supply, fill #3

## 2023-09-07 NOTE — Telephone Encounter (Signed)
 Pt wife was told at yesterdays appt to call and give details on all medication changes. Please advise

## 2023-09-07 NOTE — Telephone Encounter (Signed)
 Called and spoke with wife per DPR. Wife reports the following medications changes.  Wife states that patient is taking Lasix  20 MG every other day instead of daily. Wife reports that patient is no longer taking Hydrochlorothiazide .   Medication list updated.

## 2023-09-08 ENCOUNTER — Telehealth: Payer: Self-pay | Admitting: Internal Medicine

## 2023-09-08 NOTE — Telephone Encounter (Signed)
-----   Message from Nurse Concha Deed T sent at 09/08/2023 11:20 AM EDT ----- Please schedule for patient follow up with EP (if Dr.Klein is back, or [name], NP) 1-2 weeks AFTER cardiac pet stress that is scheduled for 11/02/23.  Thank you!

## 2023-09-08 NOTE — Telephone Encounter (Signed)
 LMOV to schedule

## 2023-09-11 ENCOUNTER — Ambulatory Visit (INDEPENDENT_AMBULATORY_CARE_PROVIDER_SITE_OTHER): Payer: PPO | Admitting: Family Medicine

## 2023-09-11 ENCOUNTER — Encounter: Payer: Self-pay | Admitting: Family Medicine

## 2023-09-11 VITALS — BP 112/74 | HR 88 | Resp 16 | Ht 66.5 in | Wt 199.3 lb

## 2023-09-11 DIAGNOSIS — I428 Other cardiomyopathies: Secondary | ICD-10-CM | POA: Diagnosis not present

## 2023-09-11 DIAGNOSIS — I2510 Atherosclerotic heart disease of native coronary artery without angina pectoris: Secondary | ICD-10-CM | POA: Diagnosis not present

## 2023-09-11 DIAGNOSIS — F5101 Primary insomnia: Secondary | ICD-10-CM | POA: Diagnosis not present

## 2023-09-11 DIAGNOSIS — I5022 Chronic systolic (congestive) heart failure: Secondary | ICD-10-CM | POA: Diagnosis not present

## 2023-09-11 DIAGNOSIS — K219 Gastro-esophageal reflux disease without esophagitis: Secondary | ICD-10-CM | POA: Diagnosis not present

## 2023-09-11 DIAGNOSIS — M545 Low back pain, unspecified: Secondary | ICD-10-CM | POA: Diagnosis not present

## 2023-09-11 DIAGNOSIS — G3184 Mild cognitive impairment, so stated: Secondary | ICD-10-CM | POA: Diagnosis not present

## 2023-09-11 DIAGNOSIS — E785 Hyperlipidemia, unspecified: Secondary | ICD-10-CM | POA: Diagnosis not present

## 2023-09-11 DIAGNOSIS — G4733 Obstructive sleep apnea (adult) (pediatric): Secondary | ICD-10-CM

## 2023-09-11 DIAGNOSIS — I482 Chronic atrial fibrillation, unspecified: Secondary | ICD-10-CM | POA: Diagnosis not present

## 2023-09-11 DIAGNOSIS — I2583 Coronary atherosclerosis due to lipid rich plaque: Secondary | ICD-10-CM

## 2023-09-11 DIAGNOSIS — J410 Simple chronic bronchitis: Secondary | ICD-10-CM | POA: Diagnosis not present

## 2023-09-11 MED ORDER — EZETIMIBE 10 MG PO TABS: 10.0000 mg | ORAL_TABLET | Freq: Every day | ORAL | 1 refills | Status: DC

## 2023-09-11 MED ORDER — ROSUVASTATIN CALCIUM 40 MG PO TABS: 40.0000 mg | ORAL_TABLET | Freq: Every day | ORAL | 1 refills | Status: DC

## 2023-09-11 MED ORDER — QUETIAPINE FUMARATE 25 MG PO TABS
25.0000 mg | ORAL_TABLET | Freq: Every day | ORAL | 1 refills | Status: DC
Start: 2023-09-11 — End: 2024-03-11

## 2023-09-11 MED ORDER — DONEPEZIL HCL 5 MG PO TABS: 5.0000 mg | ORAL_TABLET | Freq: Every day | ORAL | 0 refills | Status: DC

## 2023-09-11 NOTE — Patient Instructions (Signed)
 Encouraged mental stimulation/mind games - [email or web address]

## 2023-09-11 NOTE — Progress Notes (Signed)
 Name: Chris Price   MRN: [id number]    DOB: 1940-12-24   Date:09/11/2023       Progress Note  Subjective  Chief Complaint  Chief Complaint  Patient presents with   Medical Management of Chronic Issues   Discussed the use of AI scribe software for clinical note transcription with the patient, who gave verbal consent to proceed.  History of Present Illness Chris Price "Chris Price" is an 83 year old male with chronic back pain and atrial fibrillation who presents for a follow-up visit.  Chronic back pain is manageable with fluctuations in intensity. He takes Tylenol  650 mg twice a week, two tablets in the morning and two at night, which usually alleviates the pain. He has hydrocodone  and Robaxin  (methocarbamol ) prescribed by a physiatrist but prefers not to use hydrocodone  due to its sedative effects. Robaxin  is used occasionally for muscle relaxation. No pain radiates down the legs, and his feet remain numb, likely due to past back surgery.  He has a history of atrial fibrillation and , chronic diastolic heart failure and cardiomyopathy. Recently, he had a follow-up with his cardiologist after a remote monitoring alert for a V-tach/V-fib episode. No changes were made to his medication regimen, which includes bisoprolol , Xarelto , and Jardiance . His BNP was slightly elevated at 282, but there is no swelling in his legs or shortness of breath.   Chronic bronchitis is present with occasional shortness of breath during moderate activities but not during daily activities like showering. He uses a CPAP machine every night for sleep apnea and reports no issues with the device. His reflux is well-controlled, and he takes omeprazole  as needed.  He has noticed a decline in his memory, with a recent memory test showing a score of 8 on the six CIT screen, indicating mild cognitive dysfunction. He does not get lost while driving and has not forgotten to turn off water or appliances. His wife manages the  household bills, which has always been the case. He experiences hand tremors, which affect his ability to write and has seen neurologist last year for evaluation but cannot take higher doses of beta blockers and does not want to have any procedures done  He takes several medications, including quetiapine  for sleep, rosuvastatin  and ezetimibe  for cholesterol, and furosemide  every other day for fluid management.     Patient Active Problem List   Diagnosis Date Noted   VT (ventricular tachycardia) (HCC) intercurrent 07/25/2022   LBBB (left bundle branch block) 07/25/2022   Mild cognitive impairment 06/07/2021   Permanent atrial fibrillation (HCC) 12/31/2020   Senile purpura (HCC) 01/31/2020   Primary insomnia 01/31/2020   Hearing loss, sensorineural 09/12/2017   Prediabetes 07/01/2017   Hx of colonic polyps 06/19/2016   Long-term use of high-risk medication 06/19/2016   Cardiomyopathy (HCC) 07/14/2015   Low back pain due to displacement of intervertebral disc 04/09/2015   Diarrhea following gastrointestinal surgery 09/04/2013   S/P laparoscopic cholecystectomy 08/20/2013   Chronic atrial fibrillation (HCC) 07/15/2013   Morbid obesity (HCC) 02/28/2013   OSA on CPAP 01/03/2013   Hyperlipidemia with target low density lipoprotein (LDL) cholesterol less than 70 mg/dL 40/98/1191   ED (erectile dysfunction) of organic origin 01/23/2012   Benign localized prostatic hyperplasia with lower urinary tract symptoms (LUTS) 01/23/2012   Bladder outlet obstruction 12/25/2011   Essential tremor 12/22/2011   Hypertension    Elevated PSA, less than 10 ng/ml 09/20/2011   Automatic implantable cardioverter-defibrillator Medtronic dual-chamber 09/09/2011   Chronic systolic heart  failure (HCC) 08/25/2011   IVCD (intraventricular conduction defect)    Coronary artery disease 08/15/2011    Past Surgical History:  Procedure Laterality Date   CARDIAC CATHETERIZATION  08/15/2011   [facility]    CARDIAC  CATHETERIZATION  07/09/2013   [facility]   CARDIOVERSION  08/2011   [facility]   CATARACT EXTRACTION Right    [facility]   CHOLECYSTECTOMY  08/2013   COLONOSCOPY WITH PROPOFOL  N/A 07/30/2015   Procedure: COLONOSCOPY WITH PROPOFOL ;  Surgeon: Deveron Fly, MD;  Location: [facility] ENDOSCOPY;  Service: Endoscopy;  Laterality: N/A;   cooled thermotherapy  2007   [facility], complicated by incontinence   HERNIA REPAIR     ICD  09/08/2011   ICD GENERATOR CHANGEOUT N/A 03/08/2017   Procedure: ICD GENERATOR CHANGEOUT;  Surgeon: Verona Goodwill, MD;  Location: [facility] INVASIVE CV LAB;  Service: Cardiovascular;  Laterality: N/A;   IMPLANTABLE CARDIOVERTER DEFIBRILLATOR IMPLANT  09/08/2011   Procedure: IMPLANTABLE CARDIOVERTER DEFIBRILLATOR IMPLANT;  Surgeon: Verona Goodwill, MD;  Location: [facility] CATH LAB;  Service: Cardiovascular;;   LUMBAR LAMINECTOMY     L2 through S1 with wide decompression of the thecal sac and nerve roots.   PROSTATE BIOPSY  2013    Family History  Problem Relation Age of Onset   Heart attack Father    Heart attack Brother    Ovarian cancer Daughter    Heart attack Son     Social History   Tobacco Use   Smoking status: Never   Smokeless tobacco: Never  Substance Use Topics   Alcohol use: No     Current Outpatient Medications:    acetaminophen  (TYLENOL ) 325 MG tablet, Take 650 mg by mouth every 6 (six) hours as needed., Disp: , Rfl:    B Complex-Folic Acid  (SUPER B COMPLEX MAXI) TABS, Take 1 tablet by mouth daily., Disp: , Rfl:    bisoprolol  (ZEBETA ) 5 MG tablet, TAKE 1 TABLET BY MOUTH DAILY, Disp: 90 tablet, Rfl: 0   empagliflozin  (JARDIANCE ) 10 MG TABS tablet, Take 1 tablet (10 mg total) by mouth daily., Disp: 90 tablet, Rfl: 3   ezetimibe  (ZETIA ) 10 MG tablet, TAKE ONE TABLET BY MOUTH EVERY DAY, Disp: 90 tablet, Rfl: 1   finasteride  (PROSCAR ) 5 MG tablet, TAKE ONE TABLET BY MOUTH EVERY DAY, Disp: 90 tablet, Rfl: 2   furosemide  (LASIX ) 20 MG tablet, TAKE ONE TABLET BY MOUTH  EVERY DAY (Patient taking differently: Take 20 mg by mouth every other day. TAKE ONE TABLET BY MOUTH EVERY DAY), Disp: 90 tablet, Rfl: 1   lisinopril  (ZESTRIL ) 2.5 MG tablet, TAKE ONE TABLET BY MOUTH DAILY, Disp: 90 tablet, Rfl: 0   loratadine  (CLARITIN ) 10 MG tablet, TAKE ONE TABLET BY MOUTH DAILY AS NEEDED FOR ALLERGIES, Disp: 90 tablet, Rfl: 1   multivitamin (ONE-A-DAY MEN'S) TABS tablet, Take 1 tablet by mouth daily., Disp: , Rfl:    nitroGLYCERIN  (NITROSTAT ) 0.4 MG SL tablet, Place 1 tablet (0.4 mg total) under the tongue every 5 (five) minutes as needed for chest pain., Disp: 25 tablet, Rfl: 1   omeprazole  (PRILOSEC) 40 MG capsule, Take 1 capsule (40 mg total) by mouth daily before breakfast., Disp: 90 capsule, Rfl: 1   QUEtiapine  (SEROQUEL ) 25 MG tablet, TAKE 1 TABLET BY MOUTH AT BEDTIME, Disp: 90 tablet, Rfl: 1   rivaroxaban  (XARELTO ) 20 MG TABS tablet, Take 1 tablet (20 mg total) by mouth daily with supper., Disp: 90 tablet, Rfl: 1   rosuvastatin  (CRESTOR ) 40 MG tablet, TAKE  ONE TABLET BY MOUTH EVERY DAY, Disp: 90 tablet, Rfl: 0   triamcinolone  ointment (KENALOG ) 0.1 %, Apply 1 application  topically daily as needed., Disp: , Rfl:    vitamin C (ASCORBIC ACID) 500 MG tablet, Take 500 mg by mouth daily., Disp: , Rfl:    hydrochlorothiazide  (MICROZIDE ) 12.5 MG capsule, Take 1 tablet (12.5 mg) by mouth once daily in the morning (Patient not taking: Reported on 09/11/2023), Disp: 90 capsule, Rfl: 1   methocarbamol  (ROBAXIN ) 500 MG tablet, Take 1 tablet (500 mg total) by mouth every 8 (eight) hours as needed for muscle spasms. This can make you sleepy., Disp: 60 tablet, Rfl: 0  Allergies  Allergen Reactions   Coreg  [Carvedilol ] Other (See Comments)    hypotension   Flomax [Tamsulosin Hcl] Other (See Comments)    Hypotension    I personally reviewed active problem list, medication list, allergies with the patient/caregiver today.   ROS  Ten systems reviewed and is negative except as  mentioned in HPI    Objective Physical Exam CONSTITUTIONAL: Patient appears well-developed and well-nourished. No distress. HEENT: Head atraumatic, normocephalic, neck supple. CARDIOVASCULAR: Normal rate, irregular rhythm and normal heart sounds. Mild trace edema in legs. PULMONARY: Effort normal and breath sounds normal. Lungs clear to auscultation. No respiratory distress. MUSCULOSKELETAL: Normal gait. Without gross motor or sensory deficit. PSYCHIATRIC: Patient has a normal mood and affect. Behavior is normal. Judgment and thought content normal.  Vitals:   09/11/23 0931  BP: 112/74  Pulse: 88  Resp: 16  SpO2: 97%  Weight: 199 lb 4.8 oz (90.4 kg)  Height: 5' 6.5" (1.689 m)    Body mass index is 31.69 kg/m.  Recent Results (from the past 2160 hours)  CUP PACEART REMOTE DEVICE CHECK     Status: None   Collection Time: 08/29/23  3:33 AM  Result Value Ref Range   Date Time Interrogation Session [id number]    Pulse Generator Manufacturer MERM    Pulse Gen Model DDMB1D1 Evera MRI XT DR    Pulse Gen Serial Number [id number]    Clinic Name [facility]    Implantable Pulse Generator Type Implantable Cardiac Defibulator    Implantable Pulse Generator Implant Date [id number]    Implantable Lead Manufacturer [facility]    Implantable Lead Model 5076 CapSureFix Novus    Implantable Lead Serial Number [id number]    Implantable Lead Implant Date [id number]    Implantable Lead Location Detail 1 APPENDAGE    Implantable Lead Location [id number]    Implantable Lead Connection Status [id number]    Implantable Lead Manufacturer [facility]    Implantable Lead Model 6935 Sprint Quattro Secure S    Implantable Lead Serial Number [id number]    Implantable Lead Implant Date 44034742    Implantable Lead Location Detail 1 APEX    Implantable Lead Location [id number]    Implantable Lead Connection Status [id number]    Lead Channel Setting Sensing Sensitivity 0.6 mV   Lead Channel Setting Pacing Pulse Width 0.4 ms    Lead Channel Setting Pacing Amplitude 2.5 V   Zone Setting Status Active    Zone Setting Status Inactive    Zone Setting Status Active    Zone Setting Status [id number]    Zone Setting Status [phone]    Lead Channel Impedance Value 456 ohm   Lead Channel Impedance Value 456 ohm   Lead Channel Impedance Value 323 ohm   Lead Channel Sensing Intrinsic Amplitude 3.375 mV   Lead Channel Sensing Intrinsic Amplitude 3.375 mV  Lead Channel Pacing Threshold Amplitude 0.625 V   Lead Channel Pacing Threshold Pulse Width 0.4 ms   HighPow Impedance 62 ohm   Battery Status OK    Battery Remaining Longevity 41 mo   Battery Voltage 2.97 V   Brady Statistic RA Percent Paced 0 %   Brady Statistic RV Percent Paced 3.58 %   Brady Statistic AP VP Percent 0 %   Brady Statistic AS VP Percent 4.13 %   Brady Statistic AP VS Percent 0 %   Brady Statistic AS VS Percent 95.87 %  CBC     Status: Abnormal   Collection Time: 09/06/23  3:33 PM  Result Value Ref Range   WBC 5.5 3.4 - 10.8 x10E3/uL   RBC 4.99 4.14 - 5.80 x10E6/uL   Hemoglobin 16.3 13.0 - 17.7 g/dL   Hematocrit 40.9 81.1 - 51.0 %   MCV 99 (H) 79 - 97 fL   MCH 32.7 26.6 - 33.0 pg   MCHC 32.9 31.5 - 35.7 g/dL   RDW 91.4 78.2 - 95.6 %   Platelets 155 150 - 450 x10E3/uL  Basic metabolic panel with GFR     Status: Abnormal   Collection Time: 09/06/23  3:33 PM  Result Value Ref Range   Glucose 79 70 - 99 mg/dL   BUN 16 8 - 27 mg/dL   Creatinine, Ser 2.13 0.76 - 1.27 mg/dL   eGFR 76 >08 MV/HQI/6.96   BUN/Creatinine Ratio 16 10 - 24   Sodium 146 (H) 134 - 144 mmol/L   Potassium 5.0 3.5 - 5.2 mmol/L   Chloride 107 (H) 96 - 106 mmol/L   CO2 21 20 - 29 mmol/L   Calcium  9.6 8.6 - 10.2 mg/dL  Magnesium      Status: None   Collection Time: 09/06/23  3:33 PM  Result Value Ref Range   Magnesium  2.2 1.6 - 2.3 mg/dL  Brain natriuretic peptide     Status: Abnormal   Collection Time: 09/06/23  3:33 PM  Result Value Ref Range   BNP 282.5 (H) 0.0 - 100.0  pg/mL    Comment: Siemens ADVIA Centaur XP methodology  CUP PACEART INCLINIC DEVICE CHECK     Status: None   Collection Time: 09/06/23  4:31 PM  Result Value Ref Range   Pulse Generator Manufacturer MERM    Date Time Interrogation Session [id number]    Pulse Gen Model DDMB1D1 Evera MRI XT DR    Pulse Gen Serial Number [id number]    Clinic Name [facility]    Implantable Pulse Generator Type Implantable Cardiac Defibulator    Implantable Pulse Generator Implant Date [id number]    Implantable Lead Manufacturer [facility]    Implantable Lead Model 5076 CapSureFix Novus    Implantable Lead Serial Number [id number]    Implantable Lead Implant Date [id number]    Implantable Lead Location Detail 1 APPENDAGE    Implantable Lead Location [id number]    Implantable Lead Connection Status [id number]    Implantable Lead Manufacturer [facility]    Implantable Lead Model 6935 Sprint Quattro Secure S    Implantable Lead Serial Number [id number]    Implantable Lead Implant Date [id number]    Implantable Lead Location Detail 1 APEX    Implantable Lead Location [id number]    Implantable Lead Connection Status [id number]      PHQ2/9:    09/11/2023    9:22 AM 07/13/2023   10:51 AM 02/20/2023    9:52 AM 08/08/2022    9:15 AM 07/07/2022  11:05 AM  Depression screen PHQ 2/9  Decreased Interest 0 0 0 0 0  Down, Depressed, Hopeless 0 0 0 0 0  PHQ - 2 Score 0 0 0 0 0  Altered sleeping  0 0 0   Tired, decreased energy  0 0 0   Change in appetite  0 0 0   Feeling bad or failure about yourself   0 0 0   Trouble concentrating  0 0 0   Moving slowly or fidgety/restless  0 0 0   Suicidal thoughts  0 0 0   PHQ-9 Score  0 0 0   Difficult doing work/chores  Not difficult at all       phq 9 is negative  Fall Risk:    09/11/2023    9:22 AM 07/13/2023   10:55 AM 07/10/2023    2:07 PM 07/10/2023    8:36 AM 02/20/2023    9:52 AM  Fall Risk   Falls in the past year? 0 0 0 0 0  Number falls in past yr: 0 0 0    Injury with Fall? 0  0 0    Risk for fall due to : No Fall Risks No Fall Risks   No Fall Risks  Follow up Falls prevention discussed;Falls evaluation completed;Education provided Falls prevention discussed;Falls evaluation completed Falls evaluation completed  Falls prevention discussed     Assessment & Plan Chronic atrial fibrillation Chronic atrial fibrillation with recent ventricular tachycardia episode. Remote monitoring detected a ventricular tachycardia/ventricular fibrillation episode earlier in the month. Cardiologist visit showed well-controlled blood pressure and heart rate. BNP slightly elevated, not indicative of heart failure exacerbation. - Continue bisoprolol  and Xarelto  as prescribed.  Chronic systolic heart failure Chronic systolic heart failure secondary to cardiomyopathy. BNP slightly elevated but not indicative of acute decompensation. No new symptoms reported. - Continue current heart failure management regimen.  Cardiomyopathy Cardiomyopathy with well-managed condition. Recent labs show good renal function and slightly elevated sodium, not concerning. - Continue Jardiance  as prescribed.  Simple chronic bronchitis Simple chronic bronchitis with exertional dyspnea. No significant respiratory symptoms at rest.  Obstructive sleep apnea Obstructive sleep apnea managed with CPAP. Reports nightly use without issues. - Continue using CPAP nightly.  Chronic back pain Chronic back pain managed with Tylenol  and Robaxin  as needed. Reports good control with current regimen and prefers to avoid hydrocodone  due to sedation. - Continue Tylenol  and Robaxin  as needed for pain management. - Avoid hydrocodone  unless absolutely necessary. - Use Tylenol  and Robaxin  together during severe pain episodes.  Gastroesophageal reflux disease (GERD) GERD with occasional heartburn, managed with omeprazole  as needed. - Continue omeprazole  as needed for heartburn. - Advise to avoid late meals to prevent  heartburn.  Mild cognitive dysfunction/possible Vascular dementia Mild cognitive dysfunction with decline in memory tests. Discussed potential use of donepezil to slow progression of symptoms. Informed that donepezil does not reverse symptoms but may slow progression. Encouraged cognitive exercises and lifestyle modifications. - Prescribe donepezil 5 mg daily to start. - Monitor for side effects and efficacy, consider increasing dose if well-tolerated. - Encourage cognitive exercises and lifestyle modifications.  Essential tremor Essential tremor with previous trials of primidone  and beta blockers, which were not well-tolerated. Not interested in surgical intervention due to age and potential risks. - Continue current management approach without medication. - Encourage non-pharmacological strategies for tremor management.

## 2023-09-12 ENCOUNTER — Other Ambulatory Visit: Payer: Self-pay | Admitting: Urology

## 2023-09-12 DIAGNOSIS — N401 Enlarged prostate with lower urinary tract symptoms: Secondary | ICD-10-CM

## 2023-09-14 ENCOUNTER — Encounter: Payer: Self-pay | Admitting: Urology

## 2023-09-14 ENCOUNTER — Ambulatory Visit: Payer: Self-pay | Admitting: Urology

## 2023-09-14 VITALS — BP 113/71 | HR 62 | Ht 66.0 in | Wt 195.4 lb

## 2023-09-14 DIAGNOSIS — R319 Hematuria, unspecified: Secondary | ICD-10-CM

## 2023-09-14 DIAGNOSIS — N401 Enlarged prostate with lower urinary tract symptoms: Secondary | ICD-10-CM

## 2023-09-14 LAB — BLADDER SCAN AMB NON-IMAGING: Scan Result: 33

## 2023-09-14 MED ORDER — FINASTERIDE 5 MG PO TABS: 5.0000 mg | ORAL_TABLET | Freq: Every day | ORAL | 3 refills | Status: AC

## 2023-09-14 NOTE — Progress Notes (Signed)
 09/14/2023 1:52 PM   Chris Price 05/30/1940 [id number]  Referring provider: Sowles, Krichna, MD [address] [place],  [place] [address]  Urological history: 1. BPH with LU TS -PSA (2017) 1.05 -remote history of negative prostate biopsy (2013) -TUMT > 10 years ago  -finasteride  5 mg daily   2. ED  3.  High risk hematuria - Non-smoker - CTU (08/2022) - right adrenal adenoma myolipoma - cysto (09/2022) -BPH  Chief Complaint  Patient presents with   Follow-up   HPI: Chris Price is a 83 y.o. man who presents today for yearly appointment with his wife, Chris Price.   Previous records reviewed.     I PSS 1/0  PVR 33 mL   He has no urinary complaints.  He is taking the finasteride  5 mg daily.  Patient denies any modifying or aggravating factors.  Patient denies any recent UTI's, gross hematuria, dysuria or suprapubic/flank pain.  Patient denies any fevers, chills, nausea or vomiting.     IPSS     Row Name 09/14/23 1300         International Prostate Symptom Score   How often have you had the sensation of not emptying your bladder? Not at All     How often have you had to urinate less than every two hours? Not at All     How often have you found you stopped and started again several times when you urinated? Not at All     How often have you found it difficult to postpone urination? Not at All     How often have you had a weak urinary stream? Not at All     How often have you had to strain to start urination? Not at All     How many times did you typically get up at night to urinate? 1 Time     Total IPSS Score 1       Quality of Life due to urinary symptoms   If you were to spend the rest of your life with your urinary condition just the way it is now how would you feel about that? Delighted              Score:  1-7 Mild 8-19 Moderate 20-35 Severe  PMH: Past Medical History:  Diagnosis Date   Chronic HFrEF (heart failure with  reduced ejection fraction) (HCC)    a. 08/2020 Echo: EF 30-35%, glob HK, mild LVH. Low nl RV fxn. Sev BAE. Mild MR. Mild-mod TR. Mild AI. Ao sclerosis.   Degenerative disk disease    lumbar   Disc herniation    GERD (gastroesophageal reflux disease)    Hyperlipidemia    Hypertension    ICD (implantable cardiac defibrillator) in place    a. 02/2017 gen change to MDT MRI compatible single lead device. Ser # [id number].   IVCD (intraventricular conduction defect)    Nonspecific QRS duration 122   Lumbar spinal stenosis    Neuromuscular disorder (HCC)    neauropathy   / siatica   NICM (nonischemic cardiomyopathy) (HCC)    a. 07/2011 Echo: EF 25-30%-->s/p ICD; b. 02/2017 s/p ICD gen change; c. 08/2020 Echo: EF 30-35%.   Non-obstructive Coronary artery disease 08/15/2011   a. 08/2011 Cath: Moderate nonobstructive CAD; b. 07/2013 Cath: LM nl, LAD 29m, D1 50/20m, LCX 35m, RCA 30p, 50d-->Med rx; c. 08/2020 MV: EF 35%. No ischemia. Fixed apical defect.   Permanent atrial fibrillation (HCC)  Sleep apnea    uses cpap    Surgical History: Past Surgical History:  Procedure Laterality Date   CARDIAC CATHETERIZATION  08/15/2011   [facility]    CARDIAC CATHETERIZATION  07/09/2013   [facility]   CARDIOVERSION  08/2011   [facility]   CATARACT EXTRACTION Right    [facility]   CHOLECYSTECTOMY  08/2013   COLONOSCOPY WITH PROPOFOL  N/A 07/30/2015   Procedure: COLONOSCOPY WITH PROPOFOL ;  Surgeon: Deveron Fly, MD;  Location: [facility] ENDOSCOPY;  Service: Endoscopy;  Laterality: N/A;   cooled thermotherapy  2007   [facility], complicated by incontinence   HERNIA REPAIR     ICD  09/08/2011   ICD GENERATOR CHANGEOUT N/A 03/08/2017   Procedure: ICD GENERATOR CHANGEOUT;  Surgeon: Verona Goodwill, MD;  Location: [facility] INVASIVE CV LAB;  Service: Cardiovascular;  Laterality: N/A;   IMPLANTABLE CARDIOVERTER DEFIBRILLATOR IMPLANT  09/08/2011   Procedure: IMPLANTABLE CARDIOVERTER DEFIBRILLATOR IMPLANT;  Surgeon: Verona Goodwill, MD;  Location[facility] CATH LAB;  Service: Cardiovascular;;   LUMBAR LAMINECTOMY     L2 through S1 with wide decompression of the thecal sac and nerve roots.   PROSTATE BIOPSY  2013    Home Medications:  Allergies as of 09/14/2023       Reactions   Coreg  [carvedilol ] Other (See Comments)   hypotension   Flomax [tamsulosin Hcl] Other (See Comments)   Hypotension        Medication List        Accurate as of Sep 14, 2023  1:52 PM. If you have any questions, ask your nurse or doctor.          acetaminophen  325 MG tablet Commonly known as: TYLENOL  Take 650 mg by mouth every 6 (six) hours as needed.   ascorbic acid 500 MG tablet Commonly known as: VITAMIN C Take 500 mg by mouth daily.   bisoprolol  5 MG tablet Commonly known as: ZEBETA  TAKE 1 TABLET BY MOUTH DAILY   donepezil 5 MG tablet Commonly known as: Aricept Take 1 tablet (5 mg total) by mouth at bedtime.   ezetimibe  10 MG tablet Commonly known as: ZETIA  Take 1 tablet (10 mg total) by mouth daily.   finasteride  5 MG tablet Commonly known as: PROSCAR  Take 1 tablet (5 mg total) by mouth daily.   furosemide  20 MG tablet Commonly known as: LASIX  TAKE ONE TABLET BY MOUTH EVERY DAY What changed:  when to take this additional instructions   Jardiance  10 MG Tabs tablet Generic drug: empagliflozin  Take 1 tablet (10 mg total) by mouth daily.   lisinopril  2.5 MG tablet Commonly known as: ZESTRIL  TAKE ONE TABLET BY MOUTH DAILY   loratadine  10 MG tablet Commonly known as: CLARITIN  TAKE ONE TABLET BY MOUTH DAILY AS NEEDED FOR ALLERGIES   methocarbamol  500 MG tablet Commonly known as: ROBAXIN  Take 1 tablet (500 mg total) by mouth every 8 (eight) hours as needed for muscle spasms. This can make you sleepy.   multivitamin Tabs tablet Take 1 tablet by mouth daily.   nitroGLYCERIN  0.4 MG SL tablet Commonly known as: NITROSTAT  Place 1 tablet (0.4 mg total) under the tongue every 5 (five) minutes as needed for  chest pain.   omeprazole  40 MG capsule Commonly known as: PRILOSEC Take 1 capsule (40 mg total) by mouth daily before breakfast.   QUEtiapine  25 MG tablet Commonly known as: SEROQUEL  Take 1 tablet (25 mg total) by mouth at bedtime.   rosuvastatin  40 MG tablet Commonly known as: CRESTOR   Take 1 tablet (40 mg total) by mouth daily.   Super B Complex Maxi Tabs Take 1 tablet by mouth daily.   triamcinolone  ointment 0.1 % Commonly known as: KENALOG  Apply 1 application  topically daily as needed.   Xarelto  20 MG Tabs tablet Generic drug: rivaroxaban  Take 1 tablet (20 mg total) by mouth daily with supper.        Allergies:  Allergies  Allergen Reactions   Coreg  [Carvedilol ] Other (See Comments)    hypotension   Flomax [Tamsulosin Hcl] Other (See Comments)    Hypotension    Family History: Family History  Problem Relation Age of Onset   Heart attack Father    Heart attack Brother    Ovarian cancer Daughter    Heart attack Son     Social History:  reports that he has never smoked. He has never used smokeless tobacco. He reports that he does not drink alcohol and does not use drugs.  ROS: Pertinent ROS in HPI  Physical Exam: BP 113/71   Pulse 62   Ht 5\' 6"  (1.676 m)   Wt 195 lb 6.4 oz (88.6 kg)   BMI 31.54 kg/m   Constitutional:  Well nourished. Alert and oriented, No acute distress. HEENT: [place] AT, moist mucus membranes.  Trachea midline Cardiovascular: No clubbing, cyanosis, or edema. Respiratory: Normal respiratory effort, no increased work of breathing. Neurologic: Grossly intact, no focal deficits, moving all 4 extremities. Psychiatric: Normal mood and affect.  Laboratory Data: Lab Results  Component Value Date   WBC 5.5 09/06/2023   HGB 16.3 09/06/2023   HCT 49.5 09/06/2023   MCV 99 (H) 09/06/2023   PLT 155 09/06/2023    Lab Results  Component Value Date   CREATININE 0.98 09/06/2023  I have reviewed the labs.  See HPI.      Pertinent  Imaging:  09/14/23 13:30  Scan Result 33 ml    Assessment & Plan:    1. High risk hematuria - non smoker - work up last year - BPH  2. BPH with LU TS  - PVR demonstrates adequate emptying -Continue finasteride  5 mg daily  Return in about 1 year (around 09/13/2024) for I PSS/PVR .  These notes generated with voice recognition software. I apologize for typographical errors.  Briant Camper  [facility] [address]  [address] [place], [place] [address] [phone]

## 2023-09-27 DIAGNOSIS — G4733 Obstructive sleep apnea (adult) (pediatric): Secondary | ICD-10-CM | POA: Diagnosis not present

## 2023-10-04 ENCOUNTER — Ambulatory Visit: Payer: Self-pay | Admitting: Cardiology

## 2023-10-12 NOTE — Progress Notes (Signed)
 Remote ICD transmission.

## 2023-10-18 ENCOUNTER — Other Ambulatory Visit: Payer: Self-pay | Admitting: Family Medicine

## 2023-10-18 ENCOUNTER — Other Ambulatory Visit: Payer: Self-pay | Admitting: Internal Medicine

## 2023-10-18 DIAGNOSIS — F5101 Primary insomnia: Secondary | ICD-10-CM

## 2023-10-19 ENCOUNTER — Other Ambulatory Visit: Payer: Self-pay | Admitting: Internal Medicine

## 2023-10-31 ENCOUNTER — Encounter (HOSPITAL_COMMUNITY): Payer: Self-pay

## 2023-11-02 ENCOUNTER — Ambulatory Visit
Admission: RE | Admit: 2023-11-02 | Discharge: 2023-11-02 | Disposition: A | Discharge status: Home / Self Care | Source: Ambulatory Visit | Attending: Cardiology | Admitting: Cardiology

## 2023-11-02 DIAGNOSIS — D734 Cyst of spleen: Secondary | ICD-10-CM | POA: Insufficient documentation

## 2023-11-02 DIAGNOSIS — I700 Atherosclerosis of aorta: Secondary | ICD-10-CM | POA: Insufficient documentation

## 2023-11-02 DIAGNOSIS — J9811 Atelectasis: Secondary | ICD-10-CM | POA: Insufficient documentation

## 2023-11-02 DIAGNOSIS — D3501 Benign neoplasm of right adrenal gland: Secondary | ICD-10-CM | POA: Diagnosis not present

## 2023-11-02 DIAGNOSIS — K7689 Other specified diseases of liver: Secondary | ICD-10-CM | POA: Insufficient documentation

## 2023-11-02 DIAGNOSIS — I4821 Permanent atrial fibrillation: Secondary | ICD-10-CM | POA: Diagnosis not present

## 2023-11-02 LAB — NM PET CT CARDIAC PERFUSION MULTI W/ABSOLUTE BLOODFLOW
MBFR: 2.47
Nuc Rest EF: 27 %
Nuc Stress EF: 29 %
Peak HR: 88 {beats}/min
Rest HR: 63 {beats}/min
Rest MBF: 0.51 ml/g/min
Rest Nuclear Isotope Dose: 22.9 mCi
SRS: 8
SSS: 2
ST Depression (mm): 0 mm
Stress MBF: 1.26 ml/g/min
Stress Nuclear Isotope Dose: 22.9 mCi
TID: 1.18

## 2023-11-02 MED ORDER — REGADENOSON 0.4 MG/5ML IV SOLN
0.4000 mg | Freq: Once | INTRAVENOUS | Status: AC
  Administered 2023-11-02: 0.4 mg via INTRAVENOUS
  Filled 2023-11-02: qty 5

## 2023-11-02 MED ORDER — REGADENOSON 0.4 MG/5ML IV SOLN
INTRAVENOUS | Status: AC
  Filled 2023-11-02: qty 5

## 2023-11-02 MED ORDER — RUBIDIUM RB82 GENERATOR (RUBYFILL)
25.0000 | PACK | Freq: Once | INTRAVENOUS | Status: AC
Start: 2023-11-02 — End: 2023-11-02
  Administered 2023-11-02: 22.86 via INTRAVENOUS

## 2023-11-02 MED ORDER — RUBIDIUM RB82 GENERATOR (RUBYFILL)
25.0000 | PACK | Freq: Once | INTRAVENOUS | Status: AC
  Administered 2023-11-02: 22.85 via INTRAVENOUS

## 2023-11-07 NOTE — Progress Notes (Unsigned)
 Electrophysiology Clinic Note    Date:  11/08/2023  Patient ID:  Chris Price, Chris Price 04-25-1941, MRN [id number] PCP:  [name], MD  Cardiologist:  None Electrophysiologist: Elspeth Sage, MD   Discussed the use of AI scribe software for clinical note transcription with the patient, who gave verbal consent to proceed.   Patient Profile    Chief Complaint: VT follow-up  History of Present Illness: Chris Price is a 83 y.o. male with PMH notable for NICM, HFrEF, s/p ICD, bradycardia, perm AFib, VT, OSA on CPAP; seen today for Elspeth Sage, MD for follow-up.    He last saw Dr. Sage 07/2022. Historically hypotension limited up-titration of GDMT (entresto  and spiro). Previously was on digoxin  but had increase in VP, so was stopped and bisoprolol  lowered to 5mg  daily.  I saw patient 08/2023 after remote monitoring alerted clinic staff to VT/VF episode that occurred earlier in the month on 4/4. He was completely unaware of this episode, but was having ongoing DOE. PET CT with concerns for significant CAD and he is scheduled for LHC soon.   ON follow-up today, he continues to have dyspnea on exertion when he is pushing himself working in the yard, also endorses sensation of his heart beating hard in his chest. Denies palpitations or heart beating fast in chest. No chest pain, chest pressure, no syncope or presyncope.  He does not think he has increased lower extremity edema, though does have varicose veins. Takes lasix  daily.   He continues to take xarelto  daily, no bleeding concerns.    His wife joins for appt, whom I have met before.    Arrhythmia/Device History MDT dual chamber ICD, imp 2013; HFrEF Gen change 02/2017  AAD - Amio (for AFib) - stopped d/t ineffective   ROS:  Please see the history of present illness. All other systems are reviewed and otherwise negative.    Physical Exam    VS:  BP 124/78 (BP Location: Left Arm, Patient Position: Sitting, Cuff  Size: Normal)   Pulse 81   Ht 5' 6 (1.676 m)   Wt 199 lb 3.2 oz (90.4 kg)   SpO2 96%   BMI 32.15 kg/m  BMI: Body mass index is 32.15 kg/m.  Wt Readings from Last 3 Encounters:  11/08/23 199 lb 3.2 oz (90.4 kg)  09/14/23 195 lb 6.4 oz (88.6 kg)  09/11/23 199 lb 4.8 oz (90.4 kg)     GEN- The patient is well appearing, alert and oriented x 3 today.   Lungs- diminished on RLL, CTA otherwise Heart- Irregularly irregular rate and rhythm, no murmurs, rubs or gallops Extremities- Trace peripheral edema, warm, dry Skin-  device pocket well-healed, no tethering   Device interrogation done today and reviewed by myself:  Battery 3.2 Lead thresholds, impedence, sensing stable  Several very brief NSVT episodes, less than 2 seconds  Optivol low    Studies Reviewed   Previous EP, cardiology notes.    EKG is ordered. Personal review of EKG from today shows:  AFib with rare PVC RBBB, LAD        Cardiac Pet CT, 11/02/2023   Reversible perfusion defect in apical inferior wall and apex, suggesting small area of ischemia.  However TID (1.18) is present which is a high risk finding.  LVEF is severely reduced (27%).  While myocardial blood flow reserve is normal (2.47), this is due to a low resting flow; stress flow is significantly reduced globally and in each coronary distribution.  Severe three-vessel coronary calcifications present on CT.  Overall, would classify study as high risk and recommend cardiac catheterization   LV perfusion is abnormal. There is evidence of ischemia. Defect 1: There is a small defect with mild reduction in uptake present in the apical inferior and apex location(s) that is reversible. There is abnormal wall motion in the defect area. Consistent with ischemia.   Rest left ventricular function is abnormal. Rest EF: 27%. Stress left ventricular function is abnormal. Stress EF: 29%. End diastolic cavity size is moderately enlarged. End systolic cavity size is moderately  enlarged.   Myocardial blood flow was computed to be 0.51ml/g/min at rest and 1.26ml/g/min at stress. Global myocardial blood flow reserve was 2.47 and was normal.   Coronary calcium  was present on the attenuation correction CT images. Severe coronary calcifications were present. Coronary calcifications were present in the left anterior descending artery, left circumflex artery and right coronary artery distribution(s).   Findings are consistent with ischemia. The study is high risk.  NM myocardial, 08/27/2020 Pharmacological myocardial perfusion imaging study with no significant ischemia Small region fixed defect in the apical region LV is dilated Mild global hypokinesis, hypokinesis most notable in the septal wall ,  EF estimated at 35% CT attenuation correction images with three-vessel coronary calcification No EKG changes concerning for ischemia at peak stress or in recovery. Moderate risk scan  TTE, 08/21/2020 1. Left ventricular ejection fraction, by estimation, is 30 to 35%. The left ventricle has moderate to severely decreased function. The left ventricle demonstrates global hypokinesis. There is mild left ventricular hypertrophy. Left ventricular diastolic parameters are indeterminate.   2. Right ventricular systolic function is low normal. The right ventricular size is normal.   3. Left atrial size was severely dilated.   4. Right atrial size was severely dilated.   5. The mitral valve is normal in structure. Mild mitral valve regurgitation.   6. Tricuspid valve regurgitation is mild to moderate.   7. The aortic valve is tricuspid. Aortic valve regurgitation is mild. Mild to moderate aortic valve sclerosis/calcification is present, without any evidence of aortic stenosis.     Assessment and Plan     #) VT, sustained #) HFrEF #) ICD in situ #) perm AFib #) RBBB Recent cardiac PET CT concerning for ischemia Patient endorses DOE, no chest pain or pressure.  Difficult to assess  fluid status with varicose veins,  Will plan for R/LHC Update BMP, CBC today  Discussed xarelto  iso perm AFib with interventional cardiologist, who recommended holding for 2 days prior to procedure.      Informed Consent   Shared Decision Making/Informed Consent The risks [stroke (1 in 1000), death (1 in 1000), kidney failure [usually temporary] (1 in 500), bleeding (1 in 200), allergic reaction [possibly serious] (1 in 200)], benefits (diagnostic support and management of coronary artery disease) and alternatives of a cardiac catheterization were discussed in detail with Mr. O'Brien and he is willing to proceed.      Current medicines are reviewed at length with the patient today.   The patient has concerns regarding his medicines.  The following changes were made today:  none   Labs/ tests ordered today include:  Orders Placed This Encounter  Procedures   CBC   Basic metabolic panel with GFR   EKG [id number]     Disposition: follow-up as usual post-procedure   Signed, Chantal Needle, NP  11/08/23  11:47 AM  Electrophysiology [facility]

## 2023-11-07 NOTE — H&P (View-Only) (Signed)
 Electrophysiology Clinic Note    Date:  11/08/2023  Patient ID:  Price, Chris Dec 24, 1940, MRN [id number] PCP:  [name], MD  Cardiologist:  None Electrophysiologist: Elspeth Sage, MD   Discussed the use of AI scribe software for clinical note transcription with the patient, who gave verbal consent to proceed.   Patient Profile    Chief Complaint: VT follow-up  History of Present Illness: Chris Price is a 83 y.o. male with PMH notable for NICM, HFrEF, s/p ICD, bradycardia, perm AFib, VT, OSA on CPAP; seen today for Elspeth Sage, MD for follow-up.    He last saw Dr. Sage 07/2022. Historically hypotension limited up-titration of GDMT (entresto  and spiro). Previously was on digoxin  but had increase in VP, so was stopped and bisoprolol  lowered to 5mg  daily.  I saw patient 08/2023 after remote monitoring alerted clinic staff to VT/VF episode that occurred earlier in the month on 4/4. He was completely unaware of this episode, but was having ongoing DOE. PET CT with concerns for significant CAD and he is scheduled for LHC soon.   ON follow-up today, he continues to have dyspnea on exertion when he is pushing himself working in the yard, also endorses sensation of his heart beating hard in his chest. Denies palpitations or heart beating fast in chest. No chest pain, chest pressure, no syncope or presyncope.  He does not think he has increased lower extremity edema, though does have varicose veins. Takes lasix  daily.   He continues to take xarelto  daily, no bleeding concerns.    His wife joins for appt, whom I have met before.    Arrhythmia/Device History MDT dual chamber ICD, imp 2013; HFrEF Gen change 02/2017  AAD - Amio (for AFib) - stopped d/t ineffective   ROS:  Please see the history of present illness. All other systems are reviewed and otherwise negative.    Physical Exam    VS:  BP 124/78 (BP Location: Left Arm, Patient Position: Sitting, Cuff  Size: Normal)   Pulse 81   Ht 5' 6 (1.676 m)   Wt 199 lb 3.2 oz (90.4 kg)   SpO2 96%   BMI 32.15 kg/m  BMI: Body mass index is 32.15 kg/m.  Wt Readings from Last 3 Encounters:  11/08/23 199 lb 3.2 oz (90.4 kg)  09/14/23 195 lb 6.4 oz (88.6 kg)  09/11/23 199 lb 4.8 oz (90.4 kg)     GEN- The patient is well appearing, alert and oriented x 3 today.   Lungs- diminished on RLL, CTA otherwise Heart- Irregularly irregular rate and rhythm, no murmurs, rubs or gallops Extremities- Trace peripheral edema, warm, dry Skin-  device pocket well-healed, no tethering   Device interrogation done today and reviewed by myself:  Battery 3.2 Lead thresholds, impedence, sensing stable  Several very brief NSVT episodes, less than 2 seconds  Optivol low    Studies Reviewed   Previous EP, cardiology notes.    EKG is ordered. Personal review of EKG from today shows:  AFib with rare PVC RBBB, LAD        Cardiac Pet CT, 11/02/2023   Reversible perfusion defect in apical inferior wall and apex, suggesting small area of ischemia.  However TID (1.18) is present which is a high risk finding.  LVEF is severely reduced (27%).  While myocardial blood flow reserve is normal (2.47), this is due to a low resting flow; stress flow is significantly reduced globally and in each coronary distribution.  Severe three-vessel coronary calcifications present on CT.  Overall, would classify study as high risk and recommend cardiac catheterization   LV perfusion is abnormal. There is evidence of ischemia. Defect 1: There is a small defect with mild reduction in uptake present in the apical inferior and apex location(s) that is reversible. There is abnormal wall motion in the defect area. Consistent with ischemia.   Rest left ventricular function is abnormal. Rest EF: 27%. Stress left ventricular function is abnormal. Stress EF: 29%. End diastolic cavity size is moderately enlarged. End systolic cavity size is moderately  enlarged.   Myocardial blood flow was computed to be 0.51ml/g/min at rest and 1.26ml/g/min at stress. Global myocardial blood flow reserve was 2.47 and was normal.   Coronary calcium  was present on the attenuation correction CT images. Severe coronary calcifications were present. Coronary calcifications were present in the left anterior descending artery, left circumflex artery and right coronary artery distribution(s).   Findings are consistent with ischemia. The study is high risk.  NM myocardial, 08/27/2020 Pharmacological myocardial perfusion imaging study with no significant ischemia Small region fixed defect in the apical region LV is dilated Mild global hypokinesis, hypokinesis most notable in the septal wall ,  EF estimated at 35% CT attenuation correction images with three-vessel coronary calcification No EKG changes concerning for ischemia at peak stress or in recovery. Moderate risk scan  TTE, 08/21/2020 1. Left ventricular ejection fraction, by estimation, is 30 to 35%. The left ventricle has moderate to severely decreased function. The left ventricle demonstrates global hypokinesis. There is mild left ventricular hypertrophy. Left ventricular diastolic parameters are indeterminate.   2. Right ventricular systolic function is low normal. The right ventricular size is normal.   3. Left atrial size was severely dilated.   4. Right atrial size was severely dilated.   5. The mitral valve is normal in structure. Mild mitral valve regurgitation.   6. Tricuspid valve regurgitation is mild to moderate.   7. The aortic valve is tricuspid. Aortic valve regurgitation is mild. Mild to moderate aortic valve sclerosis/calcification is present, without any evidence of aortic stenosis.     Assessment and Plan     #) VT, sustained #) HFrEF #) ICD in situ #) perm AFib #) RBBB Recent cardiac PET CT concerning for ischemia Patient endorses DOE, no chest pain or pressure.  Difficult to assess  fluid status with varicose veins,  Will plan for R/LHC Update BMP, CBC today  Discussed xarelto  iso perm AFib with interventional cardiologist, who recommended holding for 2 days prior to procedure.      Informed Consent   Shared Decision Making/Informed Consent The risks [stroke (1 in 1000), death (1 in 1000), kidney failure [usually temporary] (1 in 500), bleeding (1 in 200), allergic reaction [possibly serious] (1 in 200)], benefits (diagnostic support and management of coronary artery disease) and alternatives of a cardiac catheterization were discussed in detail with Mr. O'Brien and he is willing to proceed.      Current medicines are reviewed at length with the patient today.   The patient has concerns regarding his medicines.  The following changes were made today:  none   Labs/ tests ordered today include:  Orders Placed This Encounter  Procedures   CBC   Basic metabolic panel with GFR   EKG [id number]     Disposition: follow-up as usual post-procedure   Signed, Chantal Needle, NP  11/08/23  11:47 AM  Electrophysiology [facility]

## 2023-11-08 ENCOUNTER — Ambulatory Visit: Attending: Cardiology | Admitting: Cardiology

## 2023-11-08 VITALS — BP 124/78 | HR 81 | Ht 66.0 in | Wt 199.2 lb

## 2023-11-08 DIAGNOSIS — D6869 Other thrombophilia: Secondary | ICD-10-CM | POA: Diagnosis not present

## 2023-11-08 DIAGNOSIS — I4821 Permanent atrial fibrillation: Secondary | ICD-10-CM

## 2023-11-08 DIAGNOSIS — I472 Ventricular tachycardia, unspecified: Secondary | ICD-10-CM | POA: Diagnosis not present

## 2023-11-08 DIAGNOSIS — I5020 Unspecified systolic (congestive) heart failure: Secondary | ICD-10-CM

## 2023-11-08 DIAGNOSIS — Z95810 Presence of automatic (implantable) cardiac defibrillator: Secondary | ICD-10-CM | POA: Diagnosis not present

## 2023-11-08 NOTE — Patient Instructions (Addendum)
 Medication Instructions:  The current medical regimen is effective;  continue present plan and medications as directed. Please refer to the Current Medication list given to you today.   *If you need a refill on your cardiac medications before your next appointment, please call your pharmacy*  Lab Work: Your provider would like for you to have following labs drawn today CBC, BMET.    If you have labs (blood work) drawn today and your tests are completely normal, you will receive your results only by: MyChart Message (if you have MyChart) OR A paper copy in the mail If you have any lab test that is abnormal or we need to change your treatment, we will call you to review the results.  Testing/Procedures: Your physician has requested that you have a cardiac catheterization. Cardiac catheterization is used to diagnose and/or treat various heart conditions. Doctors may recommend this procedure for a number of different reasons. The most common reason is to evaluate chest pain. Chest pain can be a symptom of coronary artery disease (CAD), and cardiac catheterization can show whether plaque is narrowing or blocking your heart's arteries. This procedure is also used to evaluate the valves, as well as measure the blood flow and oxygen levels in different parts of your heart. For further information please visit [email or web address] Please follow instruction sheet, as given.   Follow-Up: At [facility], you and your health needs are our priority.  As part of our continuing mission to provide you with exceptional heart care, our providers are all part of one team.  This team includes your primary Cardiologist (physician) and Advanced Practice Providers or APPs (Physician Assistants and Nurse Practitioners) who all work together to provide you with the care you need, when you need it.  Your next appointment:   2 week(s) after heart cath  Provider:   [name], NP    We recommend signing up  for the patient portal called MyChart.  Sign up information is provided on this After Visit Summary.  MyChart is used to connect with patients for Virtual Visits (Telemedicine).  Patients are able to view lab/test results, encounter notes, upcoming appointments, etc.  Non-urgent messages can be sent to your provider as well.   To learn more about what you can do with MyChart, go to [email or web address].   Other Instructions  [facility] A DEPT OF [facility] AT [facility] [address] [place], [address] [place] [address] Dept: [phone] Loc: [phone]  Chris Price  11/08/2023  You are scheduled for a Cardiac Catheterization on Tuesday, July 8 with Dr. Lonni End.  1. Please arrive at the [facility] Entrance of [facility], [address], [place] [address] at 6:30 AM (This is 1 hour(s) prior to your procedure time).  Proceed to the Check-In Desk directly inside the entrance.  Procedure Parking: Use the entrance off of the [address] side of the hospital. Turn right upon entering and follow the driveway to parking that is directly in front of the Heart [facility] Vascular Center. There is no valet parking available at this entrance, however there is an awning directly in front of the Heart & Vascular Center for drop off/ pick up for patients.  Special note: Every effort is made to have your procedure done on time. Please understand that emergencies sometimes delay scheduled procedures.  2. Diet: Do not eat solid foods after midnight.  The patient may have clear liquids until  5am upon the day of the procedure.  3. Labs: You will need to have blood drawn today- CBC, BMET.  4. Medication instructions in preparation for your procedure:   Contrast Allergy: No   Stop taking Xarelto  (Rivaroxaban ) on Sunday, July 6. And Monday July 7th.  Hold Jardiance  starting on Saturday July 5th.  On the morning of  your procedure, take your Aspirin 81 mg and any morning medicines NOT listed above.  You may use sips of water.  5. Plan to go home the same day, you will only stay overnight if medically necessary. 6. Bring a current list of your medications and current insurance cards. 7. You MUST have a responsible person to drive you home. 8. Someone MUST be with you the first 24 hours after you arrive home or your discharge will be delayed. 9. Please wear clothes that are easy to get on and off and wear slip-on shoes.  Thank you for allowing us  to care for you!   -- [facility]

## 2023-11-09 ENCOUNTER — Ambulatory Visit: Payer: Self-pay | Admitting: Cardiology

## 2023-11-09 ENCOUNTER — Telehealth: Payer: Self-pay | Admitting: *Deleted

## 2023-11-09 LAB — CBC
Hematocrit: 50.1 % (ref 37.5–51.0)
Hemoglobin: 16 g/dL (ref 13.0–17.7)
MCH: 32.3 pg (ref 26.6–33.0)
MCHC: 31.9 g/dL (ref 31.5–35.7)
MCV: 101 fL — ABNORMAL HIGH (ref 79–97)
Platelets: 141 10*3/uL — ABNORMAL LOW (ref 150–450)
RBC: 4.96 x10E6/uL (ref 4.14–5.80)
RDW: 13.8 % (ref 11.6–15.4)
WBC: 5.5 10*3/uL (ref 3.4–10.8)

## 2023-11-09 LAB — BASIC METABOLIC PANEL WITH GFR
BUN/Creatinine Ratio: 13 (ref 10–24)
BUN: 13 mg/dL (ref 8–27)
CO2: 22 mmol/L (ref 20–29)
Calcium: 9 mg/dL (ref 8.6–10.2)
Chloride: 104 mmol/L (ref 96–106)
Creatinine, Ser: 1.01 mg/dL (ref 0.76–1.27)
Glucose: 101 mg/dL — ABNORMAL HIGH (ref 70–99)
Potassium: 4 mmol/L (ref 3.5–5.2)
Sodium: 142 mmol/L (ref 134–144)
eGFR: 73 mL/min/{1.73_m2} (ref >59)

## 2023-11-09 NOTE — Telephone Encounter (Signed)
 Scheduled for: Left heart cath Date: 11/14/23 Time: 07:30  am Arrival Time: 06:30 am Location: [facility] Instructions: Reviewed  Patient confirmed date, time, location, and had no questions regarding his instructions.

## 2023-11-13 ENCOUNTER — Ambulatory Visit: Admitting: Cardiology

## 2023-11-13 ENCOUNTER — Other Ambulatory Visit: Payer: Self-pay | Admitting: Physician Assistant

## 2023-11-14 ENCOUNTER — Encounter: Payer: Self-pay | Admitting: Internal Medicine

## 2023-11-14 ENCOUNTER — Other Ambulatory Visit: Payer: Self-pay

## 2023-11-14 ENCOUNTER — Ambulatory Visit
Admission: RE | Admit: 2023-11-14 | Discharge: 2023-11-14 | Disposition: A | Discharge status: Home / Self Care | Attending: Internal Medicine | Admitting: Internal Medicine

## 2023-11-14 ENCOUNTER — Encounter
Admission: RE | Disposition: A | Discharge status: Home / Self Care | Payer: Self-pay | Source: Home / Self Care | Attending: Internal Medicine

## 2023-11-14 DIAGNOSIS — I4510 Unspecified right bundle-branch block: Secondary | ICD-10-CM | POA: Diagnosis not present

## 2023-11-14 DIAGNOSIS — I272 Pulmonary hypertension, unspecified: Secondary | ICD-10-CM | POA: Diagnosis not present

## 2023-11-14 DIAGNOSIS — R9439 Abnormal result of other cardiovascular function study: Secondary | ICD-10-CM | POA: Diagnosis not present

## 2023-11-14 DIAGNOSIS — I5022 Chronic systolic (congestive) heart failure: Secondary | ICD-10-CM | POA: Diagnosis not present

## 2023-11-14 DIAGNOSIS — I2510 Atherosclerotic heart disease of native coronary artery without angina pectoris: Secondary | ICD-10-CM | POA: Diagnosis not present

## 2023-11-14 DIAGNOSIS — Z7901 Long term (current) use of anticoagulants: Secondary | ICD-10-CM | POA: Diagnosis not present

## 2023-11-14 DIAGNOSIS — I4891 Unspecified atrial fibrillation: Secondary | ICD-10-CM | POA: Insufficient documentation

## 2023-11-14 DIAGNOSIS — Z95810 Presence of automatic (implantable) cardiac defibrillator: Secondary | ICD-10-CM | POA: Diagnosis not present

## 2023-11-14 DIAGNOSIS — Z79899 Other long term (current) drug therapy: Secondary | ICD-10-CM | POA: Insufficient documentation

## 2023-11-14 HISTORY — PX: RIGHT/LEFT HEART CATH AND CORONARY ANGIOGRAPHY: CATH118266

## 2023-11-14 LAB — POCT I-STAT EG7
Acid-base deficit: 1 mmol/L (ref 0.0–2.0)
Bicarbonate: 24.8 mmol/L (ref 20.0–28.0)
Calcium, Ion: 1.2 mmol/L (ref 1.15–1.40)
HCT: 44 % (ref 39.0–52.0)
Hemoglobin: 15 g/dL (ref 13.0–17.0)
O2 Saturation: 59 %
Potassium: 3.9 mmol/L (ref 3.5–5.1)
Sodium: 142 mmol/L (ref 135–145)
TCO2: 26 mmol/L (ref 22–32)
pCO2, Ven: 43.9 mmHg — ABNORMAL LOW (ref 44–60)
pH, Ven: 7.361 (ref 7.25–7.43)
pO2, Ven: 32 mmHg (ref 32–45)

## 2023-11-14 LAB — POCT I-STAT 7, (LYTES, BLD GAS, ICA,H+H)
Acid-base deficit: 2 mmol/L (ref 0.0–2.0)
Bicarbonate: 22.6 mmol/L (ref 20.0–28.0)
Calcium, Ion: 1.21 mmol/L (ref 1.15–1.40)
HCT: 43 % (ref 39.0–52.0)
Hemoglobin: 14.6 g/dL (ref 13.0–17.0)
O2 Saturation: 91 %
Potassium: 4 mmol/L (ref 3.5–5.1)
Sodium: 141 mmol/L (ref 135–145)
TCO2: 24 mmol/L (ref 22–32)
pCO2 arterial: 36.5 mmHg (ref 32–48)
pH, Arterial: 7.4 (ref 7.35–7.45)
pO2, Arterial: 62 mmHg — ABNORMAL LOW (ref 83–108)

## 2023-11-14 SURGERY — RIGHT/LEFT HEART CATH AND CORONARY ANGIOGRAPHY
Anesthesia: Moderate Sedation | Laterality: Bilateral

## 2023-11-14 MED ORDER — ONDANSETRON HCL 4 MG/2ML IJ SOLN: 4.0000 mg | Freq: Four times a day (QID) | INTRAMUSCULAR | Status: DC | PRN

## 2023-11-14 MED ORDER — ASPIRIN 81 MG PO CHEW
CHEWABLE_TABLET | ORAL | Status: AC
  Filled 2023-11-14: qty 1

## 2023-11-14 MED ORDER — VERAPAMIL HCL 2.5 MG/ML IV SOLN
INTRAVENOUS | Status: AC
  Filled 2023-11-14: qty 2

## 2023-11-14 MED ORDER — ACETAMINOPHEN 325 MG PO TABS: 650.0000 mg | ORAL_TABLET | ORAL | Status: DC | PRN

## 2023-11-14 MED ORDER — LIDOCAINE HCL (PF) 1 % IJ SOLN
INTRAMUSCULAR | Status: DC | PRN
  Administered 2023-11-14: 2 mL

## 2023-11-14 MED ORDER — VERAPAMIL HCL 2.5 MG/ML IV SOLN
INTRAVENOUS | Status: DC | PRN
  Administered 2023-11-14: 2.5 mg via INTRA_ARTERIAL

## 2023-11-14 MED ORDER — FENTANYL CITRATE (PF) 100 MCG/2ML IJ SOLN
INTRAMUSCULAR | Status: DC | PRN
  Administered 2023-11-14: 25 ug via INTRAVENOUS

## 2023-11-14 MED ORDER — HEPARIN SODIUM (PORCINE) 1000 UNIT/ML IJ SOLN
INTRAMUSCULAR | Status: DC | PRN
  Administered 2023-11-14: 3500 [IU] via INTRAVENOUS

## 2023-11-14 MED ORDER — FUROSEMIDE 20 MG PO TABS: 40.0000 mg | ORAL_TABLET | Freq: Every day | ORAL | Status: DC

## 2023-11-14 MED ORDER — MIDAZOLAM HCL 2 MG/2ML IJ SOLN
INTRAMUSCULAR | Status: DC | PRN
  Administered 2023-11-14: 1 mg via INTRAVENOUS

## 2023-11-14 MED ORDER — SODIUM CHLORIDE 0.9% FLUSH: 3.0000 mL | INTRAVENOUS | Status: DC | PRN

## 2023-11-14 MED ORDER — HEPARIN (PORCINE) IN NACL 1000-0.9 UT/500ML-% IV SOLN
INTRAVENOUS | Status: DC | PRN
  Administered 2023-11-14: 1000 mL

## 2023-11-14 MED ORDER — HYDRALAZINE HCL 20 MG/ML IJ SOLN: 10.0000 mg | INTRAMUSCULAR | Status: DC | PRN

## 2023-11-14 MED ORDER — FENTANYL CITRATE (PF) 100 MCG/2ML IJ SOLN
INTRAMUSCULAR | Status: AC
  Filled 2023-11-14: qty 2

## 2023-11-14 MED ORDER — SODIUM CHLORIDE 0.9 % IV SOLN
INTRAVENOUS | Status: DC
Start: 2023-11-14 — End: 2023-11-14

## 2023-11-14 MED ORDER — IOHEXOL 300 MG/ML  SOLN
INTRAMUSCULAR | Status: DC | PRN
  Administered 2023-11-14: 89 mL

## 2023-11-14 MED ORDER — HEPARIN SODIUM (PORCINE) 1000 UNIT/ML IJ SOLN
INTRAMUSCULAR | Status: AC
  Filled 2023-11-14: qty 10

## 2023-11-14 MED ORDER — SODIUM CHLORIDE 0.9% FLUSH: 3.0000 mL | Freq: Two times a day (BID) | INTRAVENOUS | Status: DC

## 2023-11-14 MED ORDER — ASPIRIN 81 MG PO CHEW
81.0000 mg | CHEWABLE_TABLET | ORAL | Status: AC
  Administered 2023-11-14: 81 mg via ORAL

## 2023-11-14 MED ORDER — LIDOCAINE HCL 1 % IJ SOLN
INTRAMUSCULAR | Status: AC
  Filled 2023-11-14: qty 20

## 2023-11-14 MED ORDER — SODIUM CHLORIDE 0.9 % IV SOLN: 250.0000 mL | INTRAVENOUS | Status: DC | PRN

## 2023-11-14 MED ORDER — MIDAZOLAM HCL 2 MG/2ML IJ SOLN
INTRAMUSCULAR | Status: AC
  Filled 2023-11-14: qty 2

## 2023-11-14 MED ORDER — HEPARIN (PORCINE) IN NACL 1000-0.9 UT/500ML-% IV SOLN
INTRAVENOUS | Status: AC
  Filled 2023-11-14: qty 1000

## 2023-11-14 SURGICAL SUPPLY — 14 items
CATH 5F 110X4 TIG (CATHETERS) IMPLANT
CATH BALLN WEDGE 5F 110CM (CATHETERS) IMPLANT
CATH INFINITI 5FR AL1 (CATHETERS) IMPLANT
CATH INFINITI 5FR JL4 (CATHETERS) IMPLANT
CATH INFINITI JR4 5F (CATHETERS) IMPLANT
DEVICE RAD TR BAND REGULAR (VASCULAR PRODUCTS) IMPLANT
DRAPE BRACHIAL (DRAPES) IMPLANT
GLIDESHEATH SLEND SS 6F .021 (SHEATH) IMPLANT
GUIDEWIRE EMER 3M J .025X150CM (WIRE) IMPLANT
GUIDEWIRE INQWIRE 1.5J.035X260 (WIRE) IMPLANT
PACK CARDIAC CATH (CUSTOM PROCEDURE TRAY) IMPLANT
SET ATX-X65L (MISCELLANEOUS) IMPLANT
SHEATH GLIDE SLENDER 4/5FR (SHEATH) IMPLANT
STATION PROTECTION PRESSURIZED (MISCELLANEOUS) IMPLANT

## 2023-11-14 NOTE — Interval H&P Note (Signed)
 History and Physical Interval Note:  11/14/2023 11:13 AM  Chris Price  has presented today for surgery, with the diagnosis of dyspnea on exertion, chronic HFrEF and abnormal myocardial PET/CT stress test.  The various methods of treatment have been discussed with the patient and family. After consideration of risks, benefits and other options for treatment, the patient has consented to  Procedure(s): RIGHT/LEFT HEART CATH AND CORONARY ANGIOGRAPHY (Bilateral) as a surgical intervention.  The patient's history has been reviewed, patient examined, no change in status, stable for surgery.  I have reviewed the patient's chart and labs.  Questions were answered to the patient's satisfaction.    Cath Lab Visit (complete for each Cath Lab visit)  Clinical Evaluation Leading to the Procedure:   ACS: No.  Non-ACS:    Anginal/Heart Failure Classification: NYHA class III  Anti-ischemic medical therapy: Minimal Therapy (1 class of medications)  Non-Invasive Test Results: High-risk stress test findings: cardiac mortality >3%/year  Prior CABG: No previous CABG  Chris Price

## 2023-11-14 NOTE — Brief Op Note (Signed)
 BRIEF CARDIAC CATHETERIZATION NOTE  11/14/2023  12:50 PM  PATIENT:  Chris Price  83 y.o. male  PRE-OPERATIVE DIAGNOSIS:  R and L Cath   Chronic HF w reduced EF   Abnormal PET CT  POST-OPERATIVE DIAGNOSIS:  * No post-op diagnosis entered *  PROCEDURE:  Procedure(s): RIGHT/LEFT HEART CATH AND CORONARY ANGIOGRAPHY (Bilateral)  FINDINGS: Severe diagonal disease; otherwise mild to moderate, diffuse CAD. Mildly elevated left heart filling pressures. Mild to moderately elevated right heart and pulmonary artery pressures. Reduced cardiac output/index.  RECOMMENDATIONS: Increase furosemide  to 40 mg PO daily. Refer to AHF clinic for further management of chronic HFrEF that is predominantly nonischemic in etiology.  Lonni Hanson, MD [facility]

## 2023-11-15 ENCOUNTER — Encounter: Payer: Self-pay | Admitting: Internal Medicine

## 2023-11-15 ENCOUNTER — Other Ambulatory Visit: Payer: Self-pay

## 2023-11-15 ENCOUNTER — Ambulatory Visit: Payer: Self-pay | Admitting: Cardiology

## 2023-11-15 DIAGNOSIS — I5022 Chronic systolic (congestive) heart failure: Secondary | ICD-10-CM

## 2023-11-20 ENCOUNTER — Telehealth: Payer: Self-pay | Admitting: Cardiology

## 2023-11-20 NOTE — Telephone Encounter (Signed)
 Called to confirm/remind patient of their appointment at the [facility] Clinic on 11/21/23.   Appointment:   [] Confirmed  [x] Left mess   [] No answer/No voice mail  [] VM Full/unable to leave message  [] Phone not in service  Patient reminded to bring all medications and/or complete list.  Confirmed patient has transportation. Gave directions, instructed to utilize valet parking.

## 2023-11-21 ENCOUNTER — Other Ambulatory Visit
Admission: RE | Admit: 2023-11-21 | Discharge: 2023-11-21 | Disposition: A | Discharge status: Home / Self Care | Source: Ambulatory Visit | Attending: Cardiology | Admitting: Cardiology

## 2023-11-21 ENCOUNTER — Encounter: Payer: Self-pay | Admitting: Cardiology

## 2023-11-21 ENCOUNTER — Ambulatory Visit (HOSPITAL_BASED_OUTPATIENT_CLINIC_OR_DEPARTMENT_OTHER): Admitting: Cardiology

## 2023-11-21 VITALS — BP 118/73 | HR 62 | Wt 198.0 lb

## 2023-11-21 DIAGNOSIS — I5022 Chronic systolic (congestive) heart failure: Secondary | ICD-10-CM | POA: Diagnosis not present

## 2023-11-21 DIAGNOSIS — I4821 Permanent atrial fibrillation: Secondary | ICD-10-CM | POA: Diagnosis not present

## 2023-11-21 LAB — BASIC METABOLIC PANEL WITH GFR
Anion gap: 10 (ref 5–15)
BUN: 18 mg/dL (ref 8–23)
CO2: 29 mmol/L (ref 22–32)
Calcium: 9.1 mg/dL (ref 8.9–10.3)
Chloride: 105 mmol/L (ref 98–111)
Creatinine, Ser: 1.11 mg/dL (ref 0.61–1.24)
GFR, Estimated: >60 mL/min (ref >60)
Glucose, Bld: 102 mg/dL — ABNORMAL HIGH (ref 70–99)
Potassium: 3.9 mmol/L (ref 3.5–5.1)
Sodium: 144 mmol/L (ref 135–145)

## 2023-11-21 LAB — BRAIN NATRIURETIC PEPTIDE: B Natriuretic Peptide: 313.8 pg/mL — ABNORMAL HIGH (ref 0.0–100.0)

## 2023-11-21 MED ORDER — FUROSEMIDE 20 MG PO TABS: 40.0000 mg | ORAL_TABLET | Freq: Every day | ORAL | 6 refills | Status: AC

## 2023-11-21 NOTE — Patient Instructions (Addendum)
 Medication Changes:  No medication changes today!  Lab Work:  Go over to the MEDICAL MALL. Go pass the gift shop and have your blood work completed.  We will only call you if the results are abnormal or if the provider would like to make medication changes.   Testing/Procedures:  Your physician has requested that you have an echocardiogram. Echocardiography is a painless test that uses sound waves to create images of your heart. It provides your doctor with information about the size and shape of your heart and how well your heart's chambers and valves are working. This procedure takes approximately one hour. There are no restrictions for this procedure. Please do NOT wear cologne, perfume, aftershave, or lotions (deodorant is allowed). Please arrive 15 minutes prior to your appointment time.  Please note: We ask at that you not bring children with you during ultrasound (echo/ vascular) testing. Due to room size and safety concerns, children are not allowed in the ultrasound rooms during exams. Our front office staff cannot provide observation of children in our lobby area while testing is being conducted. An adult accompanying a patient to their appointment will only be allowed in the ultrasound room at the discretion of the ultrasound technician under special circumstances. We apologize for any inconvenience.   Follow-Up in: Please follow up with the Advanced Heart Failure Clinic in 2 months with Dr. Gardenia and an echocardiogram.   At the [facility], you and your health needs are our priority. We have a designated team specialized in the treatment of Heart Failure. This Care Team includes your primary Heart Failure Specialized Cardiologist (physician), Advanced Practice Providers (APPs- Physician Assistants and Nurse Practitioners), and Pharmacist who all work together to provide you with the care you need, when you need it.   You may see any of the following providers  on your designated Care Team at your next follow up:  Dr. Toribio Fuel Dr. Ezra Shuck Dr. Ria Gardenia Dr. Odis Brownie Ellouise Class, FNP Jaun Bash, RPH-CPP  Please be sure to bring in all your medications bottles to every appointment.   Need to Contact Us :  If you have any questions or concerns before your next appointment please send us  a message through Hydro or call our office at [phone].    TO LEAVE A MESSAGE FOR THE NURSE SELECT OPTION 2, PLEASE LEAVE A MESSAGE INCLUDING: YOUR NAME DATE OF BIRTH CALL BACK NUMBER REASON FOR CALL**this is important as we prioritize the call backs  YOU WILL RECEIVE A CALL BACK THE SAME DAY AS LONG AS YOU CALL BEFORE 4:00 PM

## 2023-11-21 NOTE — Progress Notes (Signed)
   ADVANCED HEART FAILURE CLINIC NOTE  Referring Physician: Riddle, Suzann, NP  Primary Care: Sowles, Krichna, MD Primary Cardiologist: Heart Failure: Ria Commander, DO  CC: Heart failure with reduced EF  HPI: Chris Price is a 83 y.o. male with HFrEF 2/2 NICM, CAD, permanent atrial fibrillation, hx of VT,  OSA on CPAP presenting today to establish care   [name] has a long history of permanent atrial fibrillation with SSS s/p dcICD followed by Dr. Fernande. He underwent ICD placement in 2013 with generator upgrade in 2018. He had a LHC in 2015 that demonstrated moderate nonobstructive CAD with an estimated EF of 35% at that time. Over the past year he has had a decline in functional status. He most recently had a cardiac PET notable for reversible ischemia and LVEF of 27%. LHC since that time with multivessel CAD not amenable to PCI an CI of 2 L/min/m2. Patient referred to AHF for further care.   Interval hx:  - From a functional standpoint, [name] has been doing fairly well. Since increasing lasix  after LHC his symptoms of dyspnea have completely resolved. He can mow his lawn and perform all ADLs independently. He does become fatigued after excessive exertion but otherwise is very satisfied with his improvement after starting diuresis.   PHYSICAL EXAM: Vitals:   11/21/23 1103  BP: 118/73  Pulse: 62  SpO2: 97%   Lungs- CTA CARDIAC:  JVP: 7 cm          Normal rate with regular rhythm. no murmur.  Pulses 2+. no edema.  ABDOMEN: soft EXTREMITIES: Warm and well perfused.   DATA REVIEW  ECG: 11/08/23: atrial fibrillation with PVC  As per my personal interpretation  ECHO: 08/21/20: LVEF 30-35%, mildly reduced RV function.   CATH: 11/14/23:  Predominantly mild-moderate multivessel coronary artery disease, as detailed below.  There is severe disease involving diagonal branches, which are too small for percutaneous intervention. Mildly elevated left and right heart filling  pressures. Mild-moderate pulmonary hypertension. Reduced Fick cardiac output/index. (2 L/min/m2)   ASSESSMENT & PLAN:  Heart Failure with reduced fraction - Etiology & History: nonischemic cardiomyopathy - NYHA Class: IIB - Volume status: euvolemic, lasix  40mg  daily.  - GDMT:  -  RAASi: lisinopril  2.5mg  daily Beta-blocker: bisoprolol  5mg   -  SGLT2i: jardiance  10mg  dialy - ICD: placed by Dr. Fernande in 2013 - Advanced therapies: Not a candidate - We had an extensive discussion on GDMT and goals of care today. At this time he is focused on quality of life and remaining out of the hospital. He is doing very well functionally. Will continue current medications. Extensively counseled on lasix  dosing based on weights.  - Repeat TTE at follow up.   2. Atrial fibrillation / SSS s/p dcICD - device interrogation from 09/06/23 personally reviewed: 96% AS-VS, 4% VP - Xarelto  20mg  daily   Chris Price Advanced Heart Failure Mechanical Circulatory Support

## 2023-11-27 NOTE — Progress Notes (Signed)
 Electrophysiology Clinic Note    Date:  11/28/2023  Patient ID:  Chris Price, Chris Price 03-01-1941, MRN [id number] PCP:  [name], MD  Cardiologist:  None HF Cardiologist - [name]  Electrophysiologist:  Dr. Fernande > [name], MD  Electrophysiology APP:  [name], NP     Discussed the use of AI scribe software for clinical note transcription with the patient, who gave verbal consent to proceed.   Patient Profile    Chief Complaint: LHC follow-up  History of Present Illness: Chris Price is a 83 y.o. male with PMH notable for NICM, HFrEF, s/p ICD, bradycardia, perm AFib, VT, OSA on CPAP ; seen today for [name], MD for routine electrophysiology follow-up s/p LHC.  Historically hypotension limited up-titration of GDMT (entresto  and spiro). Previously was on digoxin  but had increase in VP, so was stopped and bisoprolol  lowered to 5mg  daily.   Device clinic was alerted 08/2023 for VT/VF episode where patient was asymptomatic during episode. He was having worsening fatigue with DOE. Updated PET CT high risk with areas of reversible perfusion defect and further reduced LVEF. He is now s/p LHC 11/2023 with mild-mod CAD, not actionable. Lasix  was increased to 40mg  BID and he was referred to HF. He saw Dr. Gardenia to establish care 7/15 where he was feeling much better with the higher lasix  doses.   On follow-up today, he is tolerating the increased lasix  dose well. He brings in a weight log, weights staying between 190-192. He denies chest pain, chest pressure, dizziness, LH, or SOB with activity. He was able to work in the yard yesterday. He does notice he is breathing hard with activity, but it appears appropriate with his activity level.      Arrhythmia/Device History MDT dual chamber ICD, imp 2013; HFrEF Gen change 02/2017  AAD - Amio (for AFib) - stopped d/t ineffective    ROS:  Please see the history of present illness. All other systems are  reviewed and otherwise negative.    Physical Exam    VS:  BP 132/70 (BP Location: Left Arm, Patient Position: Sitting, Cuff Size: Normal)   Pulse 76   Ht 5' 6 (1.676 m)   Wt 198 lb 6.4 oz (90 kg)   SpO2 93%   BMI 32.02 kg/m  BMI: Body mass index is 32.02 kg/m.      Wt Readings from Last 3 Encounters:  11/28/23 198 lb 6.4 oz (90 kg)  11/21/23 198 lb (89.8 kg)  11/14/23 200 lb 6.4 oz (90.9 kg)     GEN- The patient is well appearing, alert and oriented x 3 today.   Lungs- Clear to ausculation bilaterally, normal work of breathing.  Heart- Regular rate and rhythm, no murmurs, rubs or gallops. R wrist access site c/d/I without ecchymosis Extremities- 1+ peripheral edema, warm, dry Skin-  device pocket well-healed, no tethering   Device interrogation done today and reviewed by myself:  Battery 3.8 years Leads stable No episodes Optivol Low No changes made today   Studies Reviewed   Previous EP, cardiology notes.    EKG is not ordered. Personal review of EKG from 11/08/2023 shows:  AF w single PVC, LAD, RBBB, rate 81bpm        R/LHC, 11/14/2023 Conclusions: Predominantly mild-moderate multivessel coronary artery disease, as detailed below.  There is severe disease involving diagonal branches, which are too small for percutaneous intervention. Mildly elevated left and right heart filling pressures. Mild-moderate pulmonary hypertension. Reduced Fick cardiac  output/index.   Recommendations: Increase furosemide  to 40 mg p.o. daily.  BMP should be rechecked at follow-up. Escalate goal-directed medical therapy for predominantly nonischemic cardiomyopathy as tolerated.  Will refer to the advanced heart failure clinic for further evaluation and management. Medical therapy and risk factor modification to prevent progression of CAD  Cardiac Pet CT, 11/02/2023   Reversible perfusion defect in apical inferior wall and apex, suggesting small area of ischemia.  However TID (1.18)  is present which is a high risk finding.  LVEF is severely reduced (27%).  While myocardial blood flow reserve is normal (2.47), this is due to a low resting flow; stress flow is significantly reduced globally and in each coronary distribution.  Severe three-vessel coronary calcifications present on CT.  Overall, would classify study as high risk and recommend cardiac catheterization   LV perfusion is abnormal. There is evidence of ischemia. Defect 1: There is a small defect with mild reduction in uptake present in the apical inferior and apex location(s) that is reversible. There is abnormal wall motion in the defect area. Consistent with ischemia.   Rest left ventricular function is abnormal. Rest EF: 27%. Stress left ventricular function is abnormal. Stress EF: 29%. End diastolic cavity size is moderately enlarged. End systolic cavity size is moderately enlarged.   Myocardial blood flow was computed to be 0.51ml/g/min at rest and 1.26ml/g/min at stress. Global myocardial blood flow reserve was 2.47 and was normal.   Coronary calcium  was present on the attenuation correction CT images. Severe coronary calcifications were present. Coronary calcifications were present in the left anterior descending artery, left circumflex artery and right coronary artery distribution(s).   Findings are consistent with ischemia. The study is high risk.   NM myocardial, 08/27/2020 Pharmacological myocardial perfusion imaging study with no significant ischemia Small region fixed defect in the apical region LV is dilated Mild global hypokinesis, hypokinesis most notable in the septal wall ,  EF estimated at 35% CT attenuation correction images with three-vessel coronary calcification No EKG changes concerning for ischemia at peak stress or in recovery. Moderate risk scan   TTE, 08/21/2020 1. Left ventricular ejection fraction, by estimation, is 30 to 35%. The left ventricle has moderate to severely decreased function.  The left ventricle demonstrates global hypokinesis. There is mild left ventricular hypertrophy. Left ventricular diastolic parameters are indeterminate.   2. Right ventricular systolic function is low normal. The right ventricular size is normal.   3. Left atrial size was severely dilated.   4. Right atrial size was severely dilated.   5. The mitral valve is normal in structure. Mild mitral valve regurgitation.   6. Tricuspid valve regurgitation is mild to moderate.   7. The aortic valve is tricuspid. Aortic valve regurgitation is mild. Mild to moderate aortic valve sclerosis/calcification is present, without any evidence of aortic stenosis.     Assessment and Plan     #) NICM #) VT #) HFrEF #) ICD in situ #) perm afib #) RBBB  Recent LHC with mild-mod CAD, but no actionable lesion He remains active with NYHA class II symptoms He recently established care with HF team.  40mg  lasix  BID continued, patient tolerating well BMP stable, BNP slightly elevated Optivol low on device interrogation HF planning for updated TTE in about 3 months   #) Hypercoag d/t perm afib CHA2DS2-VASc Score = at least 5 [CHF History: 1, HTN History: 1, Diabetes History: 0, Stroke History: 0, Vascular Disease History: 1, Age Score: 2, Gender Score: 0].  Therefore, the patient's annual risk of stroke is 7.2 %.    Stroke ppx - 20mg  xarelto  daily, appropriately dosed No bleeding concerns        Current medicines are reviewed at length with the patient today.   The patient does not have concerns regarding his medicines.  The following changes were made today:  none  Labs/ tests ordered today include:  No orders of the defined types were placed in this encounter.    Disposition: Follow up with Dr. Kennyth or EP APP in 6 months, prefer MD to establish care   Signed, Chantal Needle, NP  11/28/23  9:55 AM  Electrophysiology [facility]

## 2023-11-28 ENCOUNTER — Ambulatory Visit: Payer: PPO

## 2023-11-28 ENCOUNTER — Ambulatory Visit: Attending: Cardiology | Admitting: Cardiology

## 2023-11-28 VITALS — BP 132/70 | HR 76 | Ht 66.0 in | Wt 198.4 lb

## 2023-11-28 DIAGNOSIS — I429 Cardiomyopathy, unspecified: Secondary | ICD-10-CM

## 2023-11-28 DIAGNOSIS — Z95810 Presence of automatic (implantable) cardiac defibrillator: Secondary | ICD-10-CM

## 2023-11-28 DIAGNOSIS — I472 Ventricular tachycardia, unspecified: Secondary | ICD-10-CM

## 2023-11-28 DIAGNOSIS — I447 Left bundle-branch block, unspecified: Secondary | ICD-10-CM | POA: Diagnosis not present

## 2023-11-28 DIAGNOSIS — I4821 Permanent atrial fibrillation: Secondary | ICD-10-CM | POA: Diagnosis not present

## 2023-11-28 NOTE — Patient Instructions (Signed)
 Medication Instructions:  The current medical regimen is effective;  continue present plan and medications as directed. Please refer to the Current Medication list given to you today.   *If you need a refill on your cardiac medications before your next appointment, please call your pharmacy*  Follow-Up: At [facility], you and your health needs are our priority.  As part of our continuing mission to provide you with exceptional heart care, our providers are all part of one team.  This team includes your primary Cardiologist (physician) and Advanced Practice Providers or APPs (Physician Assistants and Nurse Practitioners) who all work together to provide you with the care you need, when you need it.  Your next appointment:   6 month(s)  Provider:   Nobie Putnam, MD or Sherie Don, NP    We recommend signing up for the patient portal called "MyChart".  Sign up information is provided on this After Visit Summary.  MyChart is used to connect with patients for Virtual Visits (Telemedicine).  Patients are able to view lab/test results, encounter notes, upcoming appointments, etc.  Non-urgent messages can be sent to your provider as well.   To learn more about what you can do with MyChart, go to [email or web address].

## 2023-11-29 LAB — CUP PACEART REMOTE DEVICE CHECK
Battery Remaining Longevity: 46 mo
Battery Voltage: 2.97 V
Brady Statistic AP VP Percent: 0 %
Brady Statistic AP VS Percent: 0 %
Brady Statistic AS VP Percent: 3.7 %
Brady Statistic AS VS Percent: 96.3 %
Brady Statistic RA Percent Paced: 0 %
Brady Statistic RV Percent Paced: 5.97 %
Date Time Interrogation Session: 20250722043724
HighPow Impedance: 71 Ohm
Implantable Lead Connection Status: 753985
Implantable Lead Connection Status: 753985
Implantable Lead Implant Date: 20130502
Implantable Lead Implant Date: 20130502
Implantable Lead Location: 753859
Implantable Lead Location: 753860
Implantable Lead Model: 5076
Implantable Lead Model: 6935
Implantable Pulse Generator Implant Date: 20181031
Lead Channel Impedance Value: 342 Ohm
Lead Channel Impedance Value: 494 Ohm
Lead Channel Impedance Value: 494 Ohm
Lead Channel Pacing Threshold Amplitude: 0.625 V
Lead Channel Pacing Threshold Pulse Width: 0.4 ms
Lead Channel Sensing Intrinsic Amplitude: 3.625 mV
Lead Channel Sensing Intrinsic Amplitude: 3.625 mV
Lead Channel Setting Pacing Amplitude: 2.5 V
Lead Channel Setting Pacing Pulse Width: 0.4 ms
Lead Channel Setting Sensing Sensitivity: 0.6 mV
Zone Setting Status: 755011
Zone Setting Status: 755011

## 2023-12-04 ENCOUNTER — Other Ambulatory Visit: Payer: Self-pay | Admitting: Family Medicine

## 2023-12-04 DIAGNOSIS — G3184 Mild cognitive impairment, so stated: Secondary | ICD-10-CM

## 2023-12-22 ENCOUNTER — Other Ambulatory Visit (HOSPITAL_COMMUNITY): Payer: Self-pay

## 2023-12-28 DIAGNOSIS — G4733 Obstructive sleep apnea (adult) (pediatric): Secondary | ICD-10-CM | POA: Diagnosis not present

## 2023-12-29 DIAGNOSIS — Z85828 Personal history of other malignant neoplasm of skin: Secondary | ICD-10-CM | POA: Diagnosis not present

## 2023-12-29 DIAGNOSIS — Z08 Encounter for follow-up examination after completed treatment for malignant neoplasm: Secondary | ICD-10-CM | POA: Diagnosis not present

## 2023-12-29 DIAGNOSIS — L821 Other seborrheic keratosis: Secondary | ICD-10-CM | POA: Diagnosis not present

## 2023-12-29 DIAGNOSIS — D2262 Melanocytic nevi of left upper limb, including shoulder: Secondary | ICD-10-CM | POA: Diagnosis not present

## 2023-12-29 DIAGNOSIS — L570 Actinic keratosis: Secondary | ICD-10-CM | POA: Diagnosis not present

## 2023-12-29 DIAGNOSIS — D2272 Melanocytic nevi of left lower limb, including hip: Secondary | ICD-10-CM | POA: Diagnosis not present

## 2023-12-29 DIAGNOSIS — D225 Melanocytic nevi of trunk: Secondary | ICD-10-CM | POA: Diagnosis not present

## 2023-12-29 DIAGNOSIS — D2271 Melanocytic nevi of right lower limb, including hip: Secondary | ICD-10-CM | POA: Diagnosis not present

## 2023-12-29 DIAGNOSIS — D2261 Melanocytic nevi of right upper limb, including shoulder: Secondary | ICD-10-CM | POA: Diagnosis not present

## 2024-01-18 ENCOUNTER — Encounter: Admitting: Cardiology

## 2024-01-18 ENCOUNTER — Ambulatory Visit

## 2024-02-06 NOTE — Progress Notes (Signed)
   ADVANCED [facility] FAILURE CLINIC NOTE  Referring Physician: [name], MD  Primary Care: Sowles, Krichna, MD Primary Cardiologist: Heart Failure: Ria Commander, DO  CC: Heart failure with reduced EF  HPI: Chris Price is a 83 y.o. male with HFrEF 2/2 NICM, CAD, permanent atrial fibrillation, hx of VT,  OSA on CPAP presenting today to establish care   [name] has a long history of permanent atrial fibrillation with SSS s/p dcICD followed by Dr. Fernande. He underwent ICD placement in 2013 with generator upgrade in 2018. He had a LHC in 2015 that demonstrated moderate nonobstructive CAD with an estimated EF of 35% at that time. Over the past year he has had a decline in functional status. He most recently had a cardiac PET notable for reversible ischemia and LVEF of 27%. LHC since that time with multivessel CAD not amenable to PCI an CI of 2 L/min/m2. Patient referred to AHF for further care.   Interval hx:  - He returns for follow up with his daughter. Overall feeling fine. Gets SOB with steps every now and then Denies PND/Orthopnea. Appetite ok. No fever or chills. Weight at home 189.  Taking all medications.   PHYSICAL EXAM: Vitals:   02/07/24 0837  BP: 122/82  Pulse: 85  SpO2: 95%   Wt Readings from Last 3 Encounters:  02/07/24 88.3 kg (194 lb 9.6 oz)  11/28/23 90 kg (198 lb 6.4 oz)  11/21/23 89.8 kg (198 lb)    General:   No resp difficulty Neck: no JVD.  Cor: Regular rate & rhythm.  Lungs: clear Abdomen: soft, nontender, nondistended.  Extremities: no  edema Neuro: alert & oriented x3   DATA REVIEW  ECG: 11/08/23: atrial fibrillation with PVC  As per my personal interpretation  ECHO: 08/21/20: LVEF 30-35%, mildly reduced RV function.   CATH: 11/14/23:  Predominantly mild-moderate multivessel coronary artery disease, as detailed below.  There is severe disease involving diagonal branches, which are too small for percutaneous intervention. Mildly elevated  left and right heart filling pressures. Mild-moderate pulmonary hypertension. Reduced Fick cardiac output/index. (2 L/min/m2)   ASSESSMENT & PLAN:  Heart Failure with reduced fraction - Etiology & History: nonischemic cardiomyopathy - NYHA Class: II/III Echo today - Dr Cherrie reviewed and I provided with the results. EF 30-35% mod Dysfunction  - Optivol- Impedance up. Activity ~ 1 hour per day. No VT/AF.  - Volume status: Appears euvolemic. Continue lasix  40  mg daily.   - GDMT:  -  RAASi: lisinopril  2.5mg  daily Beta-blocker: bisoprolol  5mg   -  SGLT2i: jardiance  10mg  dialy Intolerant MRA- previously hypotensive.  - Check BMET  - ICD: placed by Dr. Fernande in 2013 - Advanced therapies: Not a candidate Discussed heart catherization results.  - Discussed going back to the gym and riding stationary bike or swimming. H 2. Atrial fibrillation / SSS s/p dcICD - device interrogation - VP Paced 6% AS-VS 95%  - Xarelto  20mg  daily    Follow up in 6 months.  I spent 30 minutes reviewing records, interviewing/examining patient, and managing orders.   Tonishia Steffy NP-C 9:00 AM

## 2024-02-07 ENCOUNTER — Ambulatory Visit (HOSPITAL_COMMUNITY): Payer: Self-pay | Admitting: Adult Health

## 2024-02-07 ENCOUNTER — Ambulatory Visit (HOSPITAL_BASED_OUTPATIENT_CLINIC_OR_DEPARTMENT_OTHER)
Admission: RE | Admit: 2024-02-07 | Discharge: 2024-02-07 | Disposition: A | Discharge status: Home / Self Care | Source: Ambulatory Visit | Attending: Cardiology | Admitting: Cardiology

## 2024-02-07 ENCOUNTER — Ambulatory Visit (HOSPITAL_COMMUNITY)
Admission: RE | Admit: 2024-02-07 | Discharge: 2024-02-07 | Disposition: A | Discharge status: Home / Self Care | Source: Ambulatory Visit | Attending: Adult Health | Admitting: Adult Health

## 2024-02-07 ENCOUNTER — Encounter (HOSPITAL_COMMUNITY): Payer: Self-pay | Admitting: Cardiology

## 2024-02-07 VITALS — BP 122/82 | HR 85 | Ht 66.0 in | Wt 194.6 lb

## 2024-02-07 DIAGNOSIS — I77810 Thoracic aortic ectasia: Secondary | ICD-10-CM | POA: Insufficient documentation

## 2024-02-07 DIAGNOSIS — G4733 Obstructive sleep apnea (adult) (pediatric): Secondary | ICD-10-CM | POA: Insufficient documentation

## 2024-02-07 DIAGNOSIS — Z7901 Long term (current) use of anticoagulants: Secondary | ICD-10-CM | POA: Insufficient documentation

## 2024-02-07 DIAGNOSIS — E785 Hyperlipidemia, unspecified: Secondary | ICD-10-CM | POA: Insufficient documentation

## 2024-02-07 DIAGNOSIS — Z79899 Other long term (current) drug therapy: Secondary | ICD-10-CM | POA: Diagnosis not present

## 2024-02-07 DIAGNOSIS — I4821 Permanent atrial fibrillation: Secondary | ICD-10-CM | POA: Diagnosis not present

## 2024-02-07 DIAGNOSIS — Z4502 Encounter for adjustment and management of automatic implantable cardiac defibrillator: Secondary | ICD-10-CM | POA: Insufficient documentation

## 2024-02-07 DIAGNOSIS — I083 Combined rheumatic disorders of mitral, aortic and tricuspid valves: Secondary | ICD-10-CM | POA: Diagnosis not present

## 2024-02-07 DIAGNOSIS — I110 Hypertensive heart disease with heart failure: Secondary | ICD-10-CM | POA: Diagnosis not present

## 2024-02-07 DIAGNOSIS — I5022 Chronic systolic (congestive) heart failure: Secondary | ICD-10-CM | POA: Insufficient documentation

## 2024-02-07 DIAGNOSIS — I2510 Atherosclerotic heart disease of native coronary artery without angina pectoris: Secondary | ICD-10-CM | POA: Diagnosis not present

## 2024-02-07 DIAGNOSIS — I495 Sick sinus syndrome: Secondary | ICD-10-CM | POA: Diagnosis not present

## 2024-02-07 DIAGNOSIS — I428 Other cardiomyopathies: Secondary | ICD-10-CM | POA: Insufficient documentation

## 2024-02-07 LAB — BASIC METABOLIC PANEL WITH GFR
Anion gap: 9 (ref 5–15)
BUN: 16 mg/dL (ref 8–23)
CO2: 27 mmol/L (ref 22–32)
Calcium: 9.4 mg/dL (ref 8.9–10.3)
Chloride: 108 mmol/L (ref 98–111)
Creatinine, Ser: 1.08 mg/dL (ref 0.61–1.24)
GFR, Estimated: >60 mL/min (ref >60)
Glucose, Bld: 95 mg/dL (ref 70–99)
Potassium: 4.2 mmol/L (ref 3.5–5.1)
Sodium: 144 mmol/L (ref 135–145)

## 2024-02-07 LAB — ECHOCARDIOGRAM COMPLETE
Area-P 1/2: 5.06 cm2
S' Lateral: 4.5 cm

## 2024-02-07 NOTE — Patient Instructions (Signed)
 Medication Changes:  No Changes In Medications at this time.   Lab Work:  Labs done today, your results will be available in MyChart, we will contact you for abnormal readings.  Follow-Up in: 6 months with Dr. Gardenia PLEASE CALL OUR OFFICE AROUND JANUARY TO GET SCHEDULED FOR YOUR APPOINTMENT. PHONE NUMBER IS [phone] OPTION 2   At the [facility], you and your health needs are our priority. We have a designated team specialized in the treatment of Heart Failure. This Care Team includes your primary Heart Failure Specialized Cardiologist (physician), Advanced Practice Providers (APPs- Physician Assistants and Nurse Practitioners), and Pharmacist who all work together to provide you with the care you need, when you need it.   You may see any of the following providers on your designated Care Team at your next follow up:  Dr. Toribio Fuel Dr. Ezra Shuck Dr. Ria Gardenia Dr. Odis Brownie Greig Mosses, NP Caffie Shed, [place] [facility] [place], [place] Beckey Coe, NP [place] Lee, NP Tinnie Redman, PharmD   Please be sure to bring in all your medications bottles to every appointment.   Need to Contact Us :  If you have any questions or concerns before your next appointment please send us  a message through [place] or call our office at [phone].    TO LEAVE A MESSAGE FOR THE NURSE SELECT OPTION 2, PLEASE LEAVE A MESSAGE INCLUDING: YOUR NAME DATE OF BIRTH CALL BACK NUMBER REASON FOR CALL**this is important as we prioritize the call backs  YOU WILL RECEIVE A CALL BACK THE SAME DAY AS LONG AS YOU CALL BEFORE 4:00 PM

## 2024-02-09 NOTE — Progress Notes (Signed)
 Remote ICD Transmission

## 2024-02-19 ENCOUNTER — Encounter: Payer: Self-pay | Admitting: Cardiology

## 2024-02-27 ENCOUNTER — Ambulatory Visit: Payer: PPO | Attending: Internal Medicine

## 2024-02-27 DIAGNOSIS — I447 Left bundle-branch block, unspecified: Secondary | ICD-10-CM

## 2024-02-29 LAB — CUP PACEART REMOTE DEVICE CHECK
Battery Remaining Longevity: 41 mo
Battery Voltage: 2.96 V
Brady Statistic AP VP Percent: 0 %
Brady Statistic AP VS Percent: 0 %
Brady Statistic AS VP Percent: 12.66 %
Brady Statistic AS VS Percent: 87.34 %
Brady Statistic RA Percent Paced: 0 %
Brady Statistic RV Percent Paced: 10.96 %
Date Time Interrogation Session: 20251021033524
HighPow Impedance: 65 Ohm
Implantable Lead Connection Status: 753985
Implantable Lead Connection Status: 753985
Implantable Lead Implant Date: 20130502
Implantable Lead Implant Date: 20130502
Implantable Lead Location: 753859
Implantable Lead Location: 753860
Implantable Lead Model: 5076
Implantable Lead Model: 6935
Implantable Pulse Generator Implant Date: 20181031
Lead Channel Impedance Value: 342 Ohm
Lead Channel Impedance Value: 494 Ohm
Lead Channel Impedance Value: 494 Ohm
Lead Channel Pacing Threshold Amplitude: 0.75 V
Lead Channel Pacing Threshold Pulse Width: 0.4 ms
Lead Channel Sensing Intrinsic Amplitude: 3.625 mV
Lead Channel Sensing Intrinsic Amplitude: 3.625 mV
Lead Channel Setting Pacing Amplitude: 2.5 V
Lead Channel Setting Pacing Pulse Width: 0.4 ms
Lead Channel Setting Sensing Sensitivity: 0.6 mV
Zone Setting Status: 755011
Zone Setting Status: 755011

## 2024-03-01 NOTE — Progress Notes (Signed)
 Remote ICD Transmission

## 2024-03-02 ENCOUNTER — Ambulatory Visit: Payer: Self-pay | Admitting: Cardiology

## 2024-03-11 ENCOUNTER — Ambulatory Visit: Admitting: Family Medicine

## 2024-03-11 VITALS — BP 114/74 | HR 68 | Resp 16 | Ht 66.0 in | Wt 195.0 lb

## 2024-03-11 DIAGNOSIS — I5022 Chronic systolic (congestive) heart failure: Secondary | ICD-10-CM | POA: Diagnosis not present

## 2024-03-11 DIAGNOSIS — G3184 Mild cognitive impairment, so stated: Secondary | ICD-10-CM | POA: Diagnosis not present

## 2024-03-11 DIAGNOSIS — Z23 Encounter for immunization: Secondary | ICD-10-CM

## 2024-03-11 DIAGNOSIS — I482 Chronic atrial fibrillation, unspecified: Secondary | ICD-10-CM

## 2024-03-11 DIAGNOSIS — J410 Simple chronic bronchitis: Secondary | ICD-10-CM

## 2024-03-11 DIAGNOSIS — Z95811 Presence of heart assist device: Secondary | ICD-10-CM

## 2024-03-11 DIAGNOSIS — E785 Hyperlipidemia, unspecified: Secondary | ICD-10-CM | POA: Diagnosis not present

## 2024-03-11 DIAGNOSIS — H6121 Impacted cerumen, right ear: Secondary | ICD-10-CM | POA: Diagnosis not present

## 2024-03-11 DIAGNOSIS — H6123 Impacted cerumen, bilateral: Secondary | ICD-10-CM | POA: Diagnosis not present

## 2024-03-11 DIAGNOSIS — G4733 Obstructive sleep apnea (adult) (pediatric): Secondary | ICD-10-CM

## 2024-03-11 DIAGNOSIS — I2510 Atherosclerotic heart disease of native coronary artery without angina pectoris: Secondary | ICD-10-CM

## 2024-03-11 DIAGNOSIS — I428 Other cardiomyopathies: Secondary | ICD-10-CM

## 2024-03-11 DIAGNOSIS — Z79899 Other long term (current) drug therapy: Secondary | ICD-10-CM | POA: Diagnosis not present

## 2024-03-11 DIAGNOSIS — R739 Hyperglycemia, unspecified: Secondary | ICD-10-CM

## 2024-03-11 DIAGNOSIS — F5101 Primary insomnia: Secondary | ICD-10-CM

## 2024-03-11 LAB — CBC WITH DIFFERENTIAL/PLATELET
Absolute Lymphocytes: 1272 {cells}/uL (ref 850–3900)
Absolute Monocytes: 509 {cells}/uL (ref 200–950)
Basophils Absolute: 37 {cells}/uL (ref 0–200)
Basophils Relative: 0.7 %
Eosinophils Absolute: 80 {cells}/uL (ref 15–500)
Eosinophils Relative: 1.5 %
HCT: 49 % (ref 38.5–50.0)
Hemoglobin: 16.2 g/dL (ref 13.2–17.1)
MCH: 32.7 pg (ref 27.0–33.0)
MCHC: 33.1 g/dL (ref 32.0–36.0)
MCV: 99 fL (ref 80.0–100.0)
MPV: 10.3 fL (ref 7.5–12.5)
Monocytes Relative: 9.6 %
Neutro Abs: 3403 {cells}/uL (ref 1500–7800)
Neutrophils Relative %: 64.2 %
Platelets: 157 Thousand/uL (ref 140–400)
RBC: 4.95 Million/uL (ref 4.20–5.80)
RDW: 13.5 % (ref 11.0–15.0)
Total Lymphocyte: 24 %
WBC: 5.3 Thousand/uL (ref 3.8–10.8)

## 2024-03-11 LAB — LIPID PANEL
Cholesterol: 124 mg/dL (ref <200)
HDL: 40 mg/dL (ref >=40)
LDL Cholesterol (Calc): 61 mg/dL
Non-HDL Cholesterol (Calc): 84 mg/dL (ref <130)
Total CHOL/HDL Ratio: 3.1 (calc) (ref <5.0)
Triglycerides: 143 mg/dL (ref <150)

## 2024-03-11 LAB — HEMOGLOBIN A1C
Hgb A1c MFr Bld: 5.7 % — ABNORMAL HIGH (ref <5.7)
Mean Plasma Glucose: 117 mg/dL
eAG (mmol/L): 6.5 mmol/L

## 2024-03-11 LAB — COMPREHENSIVE METABOLIC PANEL WITH GFR
AG Ratio: 1.7 (calc) (ref 1.0–2.5)
ALT: 32 U/L (ref 9–46)
AST: 34 U/L (ref 10–35)
Albumin: 4.3 g/dL (ref 3.6–5.1)
Alkaline phosphatase (APISO): 42 U/L (ref 35–144)
BUN: 18 mg/dL (ref 7–25)
CO2: 30 mmol/L (ref 20–32)
Calcium: 9.3 mg/dL (ref 8.6–10.3)
Chloride: 105 mmol/L (ref 98–110)
Creat: 0.87 mg/dL (ref 0.70–1.22)
Globulin: 2.6 g/dL (ref 1.9–3.7)
Glucose, Bld: 109 mg/dL — ABNORMAL HIGH (ref 65–99)
Potassium: 3.9 mmol/L (ref 3.5–5.3)
Sodium: 142 mmol/L (ref 135–146)
Total Bilirubin: 1.2 mg/dL (ref 0.2–1.2)
Total Protein: 6.9 g/dL (ref 6.1–8.1)
eGFR: 85 mL/min/1.73m2 (ref >=60)

## 2024-03-11 MED ORDER — QUETIAPINE FUMARATE 25 MG PO TABS: 25.0000 mg | ORAL_TABLET | Freq: Every day | ORAL | 1 refills | Status: AC

## 2024-03-11 MED ORDER — ROSUVASTATIN CALCIUM 40 MG PO TABS: 40.0000 mg | ORAL_TABLET | Freq: Every day | ORAL | 1 refills | Status: AC

## 2024-03-11 MED ORDER — EZETIMIBE 10 MG PO TABS: 10.0000 mg | ORAL_TABLET | Freq: Every day | ORAL | 1 refills | Status: AC

## 2024-03-11 MED ORDER — DONEPEZIL HCL 10 MG PO TABS: 10.0000 mg | ORAL_TABLET | Freq: Every day | ORAL | 1 refills | Status: AC

## 2024-03-11 NOTE — Progress Notes (Signed)
 Name: Chris Price   MRN: [id number]    DOB: Mar 14, 1941   Date:03/11/2024       Progress Note  Subjective  Chief Complaint  Chief Complaint  Patient presents with   Medical Management of Chronic Issues   Discussed the use of AI scribe software for clinical note transcription with the patient, who gave verbal consent to proceed.  History of Present Illness Chris Price is an 83 year old male with non  ischemic cardiomyopathy, heart failure with preserved ejection fraction, and permanent atrial fibrillation who presents for a follow-up visit and flu shot. He is accompanied by his wife.  He recently experienced a respiratory infection with symptoms resembling a bad cold, including chest congestion. No shortness of breath or cough. He reports feeling much better now, with only mild rhinorrhea and nasal congestion remaining.  He has a history of heart failure with preserved ejection fraction and permanent atrial fibrillation. He underwent a cardiac PET CT scan and MRI after surgery, and was told his ejection fraction was lower, around 27-29%. He has an ICD placed in 2013, which has been replaced once. He takes Xarelto  2 mg daily for anticoagulation and uses a CPAP machine for obstructive sleep apnea. No current chest pain or dyspnea during physical activities like swimming and biking.  He was told he has some vascular disease, including coronary artery disease and mild to moderate pulmonary hypertension. He takes rosuvastatin  and Zetia  for cholesterol management, which has improved his cholesterol levels. No muscle aches from these medications.  He has a history of prediabetes, with a slightly above normal A1c. He is on Jardiance  and takes two diuretics daily for fluid management. He reports weight loss from 198 to 187 pounds, attributed to the medication regimen. He monitors his weight closely due to concerns about fluid retention.  He experiences essential tremor, which affects his  handwriting. He takes Aricept  for memory issues, which are worsening, with episodes of forgetfulness and confusion. He has not seen a neurologist recently.  He experiences episodes of increased cough when he gets sick, but he has never smoked. He does not use inhalers or other medications for this condition.He has a diagnosis of chronic bronchitis   No issues with reflux and takes omeprazole  as needed. He has a history of essential tremor, which affects his handwriting, but he does not take specific medication for it due to interactions with his current regimen.    Patient Active Problem List   Diagnosis Date Noted   Abnormal stress test 11/14/2023   VT (ventricular tachycardia) (HCC) intercurrent 07/25/2022   Mild cognitive impairment 06/07/2021   Permanent atrial fibrillation (HCC) 12/31/2020   Senile purpura 01/31/2020   Primary insomnia 01/31/2020   Hearing loss, sensorineural 09/12/2017   Prediabetes 07/01/2017   Hx of colonic polyps 06/19/2016   Long-term use of high-risk medication 06/19/2016   Cardiomyopathy (HCC) 07/14/2015   Low back pain due to displacement of intervertebral disc 04/09/2015   Diarrhea following gastrointestinal surgery 09/04/2013   S/P laparoscopic cholecystectomy 08/20/2013   Chronic atrial fibrillation (HCC) 07/15/2013   Morbid obesity (HCC) 02/28/2013   OSA on CPAP 01/03/2013   Hyperlipidemia with target low density lipoprotein (LDL) cholesterol less than 70 mg/dL 88/85/7986   ED (erectile dysfunction) of organic origin 01/23/2012   Benign localized prostatic hyperplasia with lower urinary tract symptoms (LUTS) 01/23/2012   Bladder outlet obstruction 12/25/2011   Essential tremor 12/22/2011   Hypertension    Elevated PSA, less than 10 ng/ml  09/20/2011   Automatic implantable cardioverter-defibrillator Medtronic dual-chamber 09/09/2011   Chronic heart failure with reduced ejection fraction (HFrEF, <= 40%) (HCC) 08/25/2011   IVCD (intraventricular  conduction defect)    Coronary artery disease 08/15/2011    Past Surgical History:  Procedure Laterality Date   CARDIAC CATHETERIZATION  08/15/2011   [facility]    CARDIAC CATHETERIZATION  07/09/2013   [facility]   CARDIOVERSION  08/2011   [facility]   CATARACT EXTRACTION Right    [facility]   CHOLECYSTECTOMY  08/2013   COLONOSCOPY WITH PROPOFOL  N/A 07/30/2015   Procedure: COLONOSCOPY WITH PROPOFOL ;  Surgeon: Gladis RAYMOND Mariner, MD;  Location: [facility] ENDOSCOPY;  Service: Endoscopy;  Laterality: N/A;   cooled thermotherapy  2007   [place], complicated by incontinence   HERNIA REPAIR     ICD  09/08/2011   ICD GENERATOR CHANGEOUT N/A 03/08/2017   Procedure: ICD GENERATOR CHANGEOUT;  Surgeon: Fernande Elspeth BROCKS, MD;  Location: [facility] INVASIVE CV LAB;  Service: Cardiovascular;  Laterality: N/A;   IMPLANTABLE CARDIOVERTER DEFIBRILLATOR IMPLANT  09/08/2011   Procedure: IMPLANTABLE CARDIOVERTER DEFIBRILLATOR IMPLANT;  Surgeon: Elspeth BROCKS Fernande, MD;  Location: [facility] CATH LAB;  Service: Cardiovascular;;   LUMBAR LAMINECTOMY     L2 through S1 with wide decompression of the thecal sac and nerve roots.   PROSTATE BIOPSY  2013   RIGHT/LEFT HEART CATH AND CORONARY ANGIOGRAPHY Bilateral 11/14/2023   Procedure: RIGHT/LEFT HEART CATH AND CORONARY ANGIOGRAPHY;  Surgeon: Mady Bruckner, MD;  Location: [facility] INVASIVE CV LAB;  Service: Cardiovascular;  Laterality: Bilateral;    Family History  Problem Relation Age of Onset   Heart attack Father    Heart attack Brother    Ovarian cancer Daughter    Heart attack Son     Social History   Tobacco Use   Smoking status: Never   Smokeless tobacco: Never  Substance Use Topics   Alcohol use: No     Current Outpatient Medications:    acetaminophen  (TYLENOL ) 325 MG tablet, Take 650 mg by mouth every 6 (six) hours as needed., Disp: , Rfl:    B Complex-Folic Acid  (SUPER B COMPLEX MAXI) TABS, Take 1 tablet by mouth daily., Disp: , Rfl:    bisoprolol  (ZEBETA ) 5 MG tablet, TAKE  1 TABLET BY MOUTH DAILY, Disp: 90 tablet, Rfl: 3   donepezil  (ARICEPT ) 5 MG tablet, TAKE ONE TABLET BY MOUTH AT BEDTIME, Disp: 90 tablet, Rfl: 0   empagliflozin  (JARDIANCE ) 10 MG TABS tablet, Take 1 tablet (10 mg total) by mouth daily., Disp: 90 tablet, Rfl: 3   ezetimibe  (ZETIA ) 10 MG tablet, Take 1 tablet (10 mg total) by mouth daily., Disp: 90 tablet, Rfl: 1   finasteride  (PROSCAR ) 5 MG tablet, Take 1 tablet (5 mg total) by mouth daily., Disp: 90 tablet, Rfl: 3   furosemide  (LASIX ) 20 MG tablet, Take 2 tablets (40 mg total) by mouth daily., Disp: 60 tablet, Rfl: 6   lisinopril  (ZESTRIL ) 2.5 MG tablet, TAKE ONE TABLET BY MOUTH DAILY, Disp: 90 tablet, Rfl: 3   loratadine  (CLARITIN ) 10 MG tablet, TAKE ONE TABLET BY MOUTH DAILY AS NEEDED FOR ALLERGIES, Disp: 90 tablet, Rfl: 1   methocarbamol  (ROBAXIN ) 500 MG tablet, Take 1 tablet (500 mg total) by mouth every 8 (eight) hours as needed for muscle spasms. This can make you sleepy., Disp: 60 tablet, Rfl: 0   multivitamin (ONE-A-DAY MEN'S) TABS tablet, Take 1 tablet by mouth daily., Disp: , Rfl:    nitroGLYCERIN  (NITROSTAT ) 0.4 MG SL tablet,  Place 1 tablet (0.4 mg total) under the tongue every 5 (five) minutes as needed for chest pain., Disp: 25 tablet, Rfl: 1   omeprazole  (PRILOSEC) 40 MG capsule, Take 1 capsule (40 mg total) by mouth daily before breakfast., Disp: 90 capsule, Rfl: 1   QUEtiapine  (SEROQUEL ) 25 MG tablet, Take 1 tablet (25 mg total) by mouth at bedtime., Disp: 90 tablet, Rfl: 1   rivaroxaban  (XARELTO ) 20 MG TABS tablet, Take 1 tablet (20 mg total) by mouth daily with supper., Disp: 90 tablet, Rfl: 1   rosuvastatin  (CRESTOR ) 40 MG tablet, Take 1 tablet (40 mg total) by mouth daily., Disp: 90 tablet, Rfl: 1   triamcinolone  ointment (KENALOG ) 0.1 %, Apply 1 application  topically daily as needed (irritation)., Disp: , Rfl:    vitamin C (ASCORBIC ACID) 500 MG tablet, Take 500 mg by mouth daily., Disp: , Rfl:   Allergies  Allergen Reactions    Coreg  [Carvedilol ] Other (See Comments)    hypotension   Flomax [Tamsulosin Hcl] Other (See Comments)    Hypotension    I personally reviewed active problem list, medication list, allergies with the patient/caregiver today.   ROS  Ten systems reviewed and is negative except as mentioned in HPI    Objective Physical Exam  CONSTITUTIONAL: Patient appears well-developed and well-nourished. No distress. HEENT: Head atraumatic, normocephalic, neck supple. Cerumen in right ear, left ear clear. CARDIOVASCULAR: Normal rate, irregular rhythm and normal heart sounds. No murmur heard. No BLE edema. Varicose veins noticed on right lower extremity  PULMONARY: Effort normal and breath sounds normal. No respiratory distress. ABDOMINAL: There is no tenderness or distention. MUSCULOSKELETAL: Normal gait. Without gross motor or sensory deficit. PSYCHIATRIC: Patient has a normal mood and affect. Behavior is normal. Judgment and thought content normal. SKIN: Seborrheic keratosis on scalp.  NEURO: tremors on both hands  Vitals:   03/11/24 0959  BP: 114/74  Pulse: 68  Resp: 16  SpO2: 96%  Weight: 195 lb (88.5 kg)  Height: 5' 6 (1.676 m)    Body mass index is 31.47 kg/m.  Recent Results (from the past 2160 hours)  ECHOCARDIOGRAM COMPLETE     Status: None   Collection Time: 02/07/24  8:28 AM  Result Value Ref Range   S' Lateral 4.50 cm   Area-P 1/2 5.06 cm2   Est EF 30 - 35%   Basic Metabolic Panel (BMET)     Status: None   Collection Time: 02/07/24  9:10 AM  Result Value Ref Range   Sodium 144 135 - 145 mmol/L   Potassium 4.2 3.5 - 5.1 mmol/L   Chloride 108 98 - 111 mmol/L   CO2 27 22 - 32 mmol/L   Glucose, Bld 95 70 - 99 mg/dL    Comment: Glucose reference range applies only to samples taken after fasting for at least 8 hours.   BUN 16 8 - 23 mg/dL   Creatinine, Ser 8.91 0.61 - 1.24 mg/dL   Calcium  9.4 8.9 - 10.3 mg/dL   GFR, Estimated >39 >39 mL/min    Comment:  (NOTE) Calculated using the CKD-EPI Creatinine Equation (2021)    Anion gap 9 5 - 15    Comment: Performed at [facility], [address], [place], [place] [address]  CUP PACEART REMOTE DEVICE CHECK     Status: None   Collection Time: 02/27/24  3:35 AM  Result Value Ref Range   Date Time Interrogation Session [id number]    Pulse Generator Manufacturer MERM  Pulse Gen Model [id number] Evera MRI XT DR    Pulse Gen Serial Number [id number]    Clinic Name [facility]    Implantable Pulse Generator Type Implantable Cardiac Defibulator    Implantable Pulse Generator Implant Date 79818968    Implantable Lead Manufacturer [facility]    Implantable Lead Model 5076 CapSureFix Novus    Implantable Lead Serial Number [id number]    Implantable Lead Implant Date 79869497    Implantable Lead Location Detail 1 APPENDAGE    Implantable Lead Location [id number]    Implantable Lead Connection Status [id number]    Implantable Lead Manufacturer [facility]    Implantable Lead Model 6935 Sprint Quattro Secure S    Implantable Lead Serial Number [id number]    Implantable Lead Implant Date 79869497    Implantable Lead Location Detail 1 APEX    Implantable Lead Location [id number]    Implantable Lead Connection Status [id number]    Lead Channel Setting Sensing Sensitivity 0.6 mV   Lead Channel Setting Pacing Pulse Width 0.4 ms   Lead Channel Setting Pacing Amplitude 2.5 V   Zone Setting Status Active    Zone Setting Status Inactive    Zone Setting Status Active    Zone Setting Status 755011    Zone Setting Status [phone]    Lead Channel Impedance Value 494 ohm   Lead Channel Impedance Value 494 ohm   Lead Channel Impedance Value 342 ohm   Lead Channel Sensing Intrinsic Amplitude 3.625 mV   Lead Channel Sensing Intrinsic Amplitude 3.625 mV   Lead Channel Pacing Threshold Amplitude 0.75 V   Lead Channel Pacing Threshold Pulse Width 0.4 ms   HighPow Impedance 65 ohm   Battery Status OK    Battery Remaining  Longevity 41 mo   Battery Voltage 2.96 V   Brady Statistic RA Percent Paced 0 %   Brady Statistic RV Percent Paced 10.96 %   Brady Statistic AP VP Percent 0 %   Brady Statistic AS VP Percent 12.66 %   Brady Statistic AP VS Percent 0 %   Brady Statistic AS VS Percent 87.34 %      PHQ2/9:    03/11/2024    9:48 AM 09/11/2023    9:22 AM 07/13/2023   10:51 AM 02/20/2023    9:52 AM 08/08/2022    9:15 AM  Depression screen PHQ 2/9  Decreased Interest 0 0 0 0 0  Down, Depressed, Hopeless 0 0 0 0 0  PHQ - 2 Score 0 0 0 0 0  Altered sleeping   0 0 0  Tired, decreased energy   0 0 0  Change in appetite   0 0 0  Feeling bad or failure about yourself    0 0 0  Trouble concentrating   0 0 0  Moving slowly or fidgety/restless   0 0 0  Suicidal thoughts   0 0 0  PHQ-9 Score   0 0 0  Difficult doing work/chores   Not difficult at all      phq 9 is negative  Fall Risk:    03/11/2024    9:48 AM 09/11/2023    9:22 AM 07/13/2023   10:55 AM 07/10/2023    2:07 PM 07/10/2023    8:36 AM  Fall Risk   Falls in the past year? 0 0 0 0 0  Number falls in past yr: 0 0 0 0   Injury with Fall? 0 0 0 0   Risk for fall due to :  No Fall Risks No Fall Risks No Fall Risks    Follow up Falls evaluation completed Falls prevention discussed;Falls evaluation completed;Education provided Falls prevention discussed;Falls evaluation completed Falls evaluation completed       Assessment & Plan Encounter for immunization He is eligible for flu vaccination after recovering from a respiratory infection. - Administer flu shot today.  Chronic systolic heart failure with preserved ejection fraction and Presence of heart assistance device Chronic systolic heart failure with preserved ejection fraction and ICD placement. Recent evaluations showed no ischemic cardiomyopathy, concern for fluid retention, ejection fraction 27-29%. On diuretics and Jardiance . - Check kidney function to monitor effects of diuretics. - Monitor  weight and fluid status; adjust diuretics as needed based on weight changes.  Permanent atrial fibrillation on anticoagulation Permanent atrial fibrillation on Xarelto . No symptoms of palpitations or irregular heartbeat. - Continue Xarelto  2 mg daily.  Simple Chronic bronchitis - stable, gest worse during URI and is recovering now  Atherosclerotic heart disease of native coronary artery Coronary artery disease with history of vascular disease. Active, no chest pain or shortness of breath during activities. - Continue current management and medications as prescribed by cardiologist.  Hyperglycemia (prediabetes) Prediabetes with slightly elevated A1c. On Jardiance , which aids in managing heart failure. - Check A1c and blood glucose levels today.  Obstructive sleep apnea Uses CPAP machine effectively, reports no issues. - Continue use of CPAP machine.  Simple chronic bronchitis Episodes of increased coughing when sick, no smoking history, no current respiratory symptoms.  Mild cognitive impairment Reports worsening memory issues, on Aricept  5 mg. - Increase Aricept  to 10 mg daily.  Essential tremor Essential tremor affects handwriting, evaluated by specialist, no Parkinson's disease.  Impacted cerumen, right ear Impacted cerumen in right ear affecting hearing. - Perform ear lavage to remove cerumen from the right ear.  Verbal consent given Possible side effects discussed with patient Right ear  lavaged with warm water and peroxide  Patient tolerated procedure well No complications

## 2024-03-12 ENCOUNTER — Ambulatory Visit: Payer: Self-pay | Admitting: Family Medicine

## 2024-03-15 ENCOUNTER — Ambulatory Visit: Payer: Self-pay | Admitting: Cardiology

## 2024-03-16 ENCOUNTER — Other Ambulatory Visit (HOSPITAL_COMMUNITY): Payer: Self-pay

## 2024-03-16 ENCOUNTER — Other Ambulatory Visit: Payer: Self-pay | Admitting: Internal Medicine

## 2024-03-16 ENCOUNTER — Encounter (HOSPITAL_COMMUNITY): Payer: Self-pay

## 2024-03-16 DIAGNOSIS — I428 Other cardiomyopathies: Secondary | ICD-10-CM

## 2024-03-18 ENCOUNTER — Other Ambulatory Visit: Payer: Self-pay

## 2024-03-18 ENCOUNTER — Other Ambulatory Visit (HOSPITAL_COMMUNITY): Payer: Self-pay

## 2024-03-18 MED ORDER — RIVAROXABAN 20 MG PO TABS
20.0000 mg | ORAL_TABLET | Freq: Every day | ORAL | 1 refills | Status: AC
  Filled 2024-03-18: qty 90, 90d supply, fill #0

## 2024-03-18 NOTE — Telephone Encounter (Signed)
 Prescription refill request for Xarelto  received.  Indication: A. FIB Last office visit: 11/28/23 Weight: 195 lbs Age: 83 Scr: 0.87 (EPIC 03/11/24) CrCl: 65  Per protocol okay to order.

## 2024-03-29 ENCOUNTER — Ambulatory Visit (INDEPENDENT_AMBULATORY_CARE_PROVIDER_SITE_OTHER): Admitting: Internal Medicine

## 2024-03-29 ENCOUNTER — Encounter: Payer: Self-pay | Admitting: Internal Medicine

## 2024-03-29 ENCOUNTER — Other Ambulatory Visit: Payer: Self-pay

## 2024-03-29 VITALS — BP 130/80 | HR 100 | Temp 98.1°F | Resp 18 | Ht 66.0 in | Wt 195.9 lb

## 2024-03-29 DIAGNOSIS — J0110 Acute frontal sinusitis, unspecified: Secondary | ICD-10-CM

## 2024-03-29 DIAGNOSIS — R051 Acute cough: Secondary | ICD-10-CM

## 2024-03-29 DIAGNOSIS — R0989 Other specified symptoms and signs involving the circulatory and respiratory systems: Secondary | ICD-10-CM | POA: Diagnosis not present

## 2024-03-29 LAB — POC COVID19/FLU A&B COMBO
Covid Antigen, POC: NEGATIVE
Influenza A Antigen, POC: NEGATIVE
Influenza B Antigen, POC: NEGATIVE

## 2024-03-29 MED ORDER — BENZONATATE 100 MG PO CAPS: 100.0000 mg | ORAL_CAPSULE | Freq: Two times a day (BID) | ORAL | 0 refills | Status: AC | PRN

## 2024-03-29 MED ORDER — ALBUTEROL SULFATE HFA 108 (90 BASE) MCG/ACT IN AERS: 2.0000 | INHALATION_SPRAY | Freq: Four times a day (QID) | RESPIRATORY_TRACT | 2 refills | Status: AC | PRN

## 2024-03-29 MED ORDER — AMOXICILLIN-POT CLAVULANATE 875-125 MG PO TABS: 1.0000 | ORAL_TABLET | Freq: Two times a day (BID) | ORAL | 0 refills | Status: AC

## 2024-03-29 NOTE — Progress Notes (Signed)
 Acute Office Visit  Subjective:     Patient ID: Chris Price, male    DOB: 01-17-1941, 83 y.o.   MRN: [id number]  Chief Complaint  Patient presents with   Sinusitis    Cough, congestion for 1 month intermittent    Sinusitis Associated symptoms include congestion and coughing. Pertinent negatives include no chills, ear pain, shortness of breath or sore throat.   Patient is in today for cough, congestion x 1 month.   Discussed the use of AI scribe software for clinical note transcription with the patient, who gave verbal consent to proceed.  History of Present Illness Chris Price is an 83 year old male who presents with a persistent cough and congestion for one month.  He experiences a productive cough with white phlegm and chest congestion. He feels hoarse and sweaty but has not had fevers. Over-the-counter medications, including Carcine and a sinus and allergy pill, have not significantly improved his symptoms. He uses honey and lemon for relief but has not taken Claritin  recently. Shortness of breath occurs with walking, consistent with his usual level, and he feels wheezy. He uses a CPAP machine with a humidifier at night and has not used inhalers. He received COVID and flu vaccinations after initial symptom improvement, followed by a return of symptoms.   Review of Systems  Constitutional:  Negative for chills and fever.  HENT:  Positive for congestion. Negative for ear pain, sinus pain and sore throat.   Respiratory:  Positive for cough and sputum production. Negative for shortness of breath and wheezing.         Objective:    BP 130/80 (Cuff Size: Large)   Pulse 100   Temp 98.1 F (36.7 C) (Oral)   Resp 18   Ht 5' 6 (1.676 m)   Wt 195 lb 14.4 oz (88.9 kg)   SpO2 91%   BMI 31.62 kg/m  BP Readings from Last 3 Encounters:  03/29/24 130/80  03/11/24 114/74  02/07/24 122/82   Wt Readings from Last 3 Encounters:  03/29/24 195 lb 14.4 oz (88.9 kg)   03/11/24 195 lb (88.5 kg)  02/07/24 194 lb 9.6 oz (88.3 kg)      Physical Exam Constitutional:      Appearance: Normal appearance.  HENT:     Head: Normocephalic and atraumatic.     Right Ear: Tympanic membrane, ear canal and external ear normal.     Left Ear: Tympanic membrane, ear canal and external ear normal.     Nose: Congestion present.     Mouth/Throat:     Mouth: Mucous membranes are moist.     Pharynx: Oropharynx is clear.  Eyes:     Conjunctiva/sclera: Conjunctivae normal.  Cardiovascular:     Rate and Rhythm: Normal rate and regular rhythm.  Pulmonary:     Effort: Pulmonary effort is normal.     Breath sounds: Normal breath sounds. No wheezing, rhonchi or rales.  Skin:    General: Skin is warm and dry.  Neurological:     General: No focal deficit present.     Mental Status: He is alert. Mental status is at baseline.  Psychiatric:        Mood and Affect: Mood normal.        Behavior: Behavior normal.     No results found for any visits on 03/29/24.      Assessment & Plan:   Assessment & Plan Acute bacterial sinusitis with cough and chest congestion  Symptoms persisted for one month, likely bacterial sinusitis post-viral or allergic process. Augmentin  chosen for efficacy, with caution for diarrhea. Probiotics suggested. Cough suppressant and albuterol  inhaler for relief. Mucinex  recommended for congestion. - Flu and COVID negative.  - Prescribed Augmentin  for 5 days. - Prescribed cough suppressant for nighttime use. - Prescribed albuterol  inhaler every 4-6 hours as needed for shortness of breath or wheezing. - Recommended over-the-counter Mucinex  in the morning for congestion. - Advised continuation of Certavite for decongestant effects. - Suggested using a humidifier at night for congestion. - Instructed to call if symptoms do not improve after antibiotic course.  - POC Covid19/Flu A&B Antigen - amoxicillin -clavulanate (AUGMENTIN ) 875-125 MG tablet;  Take 1 tablet by mouth 2 (two) times daily for 5 days.  Dispense: 10 tablet; Refill: 0 - benzonatate  (TESSALON ) 100 MG capsule; Take 1 capsule (100 mg total) by mouth 2 (two) times daily as needed for cough.  Dispense: 20 capsule; Refill: 0 - albuterol  (VENTOLIN  HFA) 108 (90 Base) MCG/ACT inhaler; Inhale 2 puffs into the lungs every 6 (six) hours as needed for wheezing or shortness of breath.  Dispense: 8 g; Refill: 2   Return if symptoms worsen or fail to improve.  Sharyle Fischer, DO

## 2024-04-02 ENCOUNTER — Telehealth: Payer: Self-pay

## 2024-04-02 NOTE — Telephone Encounter (Signed)
 Copied from CRM [id number]. Topic: Clinical - Medical Advice >> Apr 02, 2024  3:15 PM Nathanel BROCKS wrote: Reason for CRM: pt stated that he was in last week and he seems to not be getting any better. Pt is still congested,coughing. Please call pt and advise what to do.

## 2024-04-03 ENCOUNTER — Encounter: Payer: Self-pay | Admitting: Nurse Practitioner

## 2024-04-03 ENCOUNTER — Other Ambulatory Visit: Payer: Self-pay | Admitting: Family Medicine

## 2024-04-03 ENCOUNTER — Ambulatory Visit: Admitting: Nurse Practitioner

## 2024-04-03 ENCOUNTER — Ambulatory Visit: Payer: Self-pay

## 2024-04-03 VITALS — BP 127/82 | HR 69 | Temp 98.4°F | Ht 66.0 in | Wt 188.0 lb

## 2024-04-03 DIAGNOSIS — J069 Acute upper respiratory infection, unspecified: Secondary | ICD-10-CM

## 2024-04-03 MED ORDER — DOXYCYCLINE HYCLATE 100 MG PO TABS: 100.0000 mg | ORAL_TABLET | Freq: Two times a day (BID) | ORAL | 0 refills | Status: AC

## 2024-04-03 NOTE — Telephone Encounter (Signed)
 FYI Only or Action Required?: FYI only for provider: appointment scheduled on 04/03/2024.  Patient was last seen in primary care on 03/29/2024 by Bernardo Fend, DO.  Called Nurse Triage reporting Nasal Congestion.  Symptoms began about a month ago.  Interventions attempted: OTC medications: Coricidin, Prescription medications: Augmentin , and Rest, hydration, or home remedies.  Symptoms are: gradually worsening.  Triage Disposition: See Physician Within 24 Hours  Patient/caregiver understands and will follow disposition?: Yes  Reason for Disposition  [1] Taking antibiotic > 72 hours (3 days) AND [2] sinus pain not improved  Answer Assessment - Initial Assessment Questions 1. ANTIBIOTIC: What antibiotic are you taking? How many times a day?     Augmentin  2x/day-has finished course of antibiotics   2. ONSET: When was the antibiotic started?     11/21  3. PAIN: How bad is the pain?   (Scale 0-10; or none, mild, moderate or severe)     Patient states he's not in pain, but just feels really bad  4. FEVER: Do you have a fever? If Yes, ask: What is it, how was it measured, and when did it start?      No fever  5. SYMPTOMS: Are there any other symptoms you're concerned about? If Yes, ask: When did it start?     Chills, cold sweats  6. PREGNANCY: Is there any chance you are pregnant? When was your last menstrual period?     NA  Protocols used: Sinus Infection on Antibiotic Follow-up Call-[facility]  Copied from CRM #[id number]. Topic: Clinical - Red Word Triage >> Apr 03, 2024 11:06 AM Larissa RAMAN wrote: [facility] that prompted transfer to Nurse Triage: congestion, cough- worsening

## 2024-04-03 NOTE — Progress Notes (Signed)
 Leron Glance, NP-C Phone: [phone]  Chris Price is a 83 y.o. male who presents today for congestion.   Discussed the use of AI scribe software for clinical note transcription with the patient, who gave verbal consent to proceed.  History of Present Illness   Chris Price is an 83 year old male who presents with persistent sinusitis and chest congestion.  He has been experiencing symptoms of sinusitis and chest congestion for approximately one month. Initial treatment with Augmentin , taken twice daily for five days, did not result in improvement. Symptoms initially improved after a week of bed rest but recurred following COVID and flu vaccinations. Since then, he has experienced intermittent symptoms, with no significant relief from the initial antibiotic course.  He has persistent congestion in both his chest and head, accompanied by mucus production and drainage down his throat. He experiences a sore throat and frequent coughing. No shortness of breath, fever, or significant fatigue. He feels cold at night, particularly in his feet and legs, despite them not feeling cold to the touch. No headaches, nausea, or vomiting.  His current medications include Coricidin, an inhaler used every six hours, and cough pills. He has also used a spray for his throat and Mucinex  tablets. He reports that the inhaler does not provide much relief.      Social History   Tobacco Use  Smoking Status Never  Smokeless Tobacco Never    Current Outpatient Medications on File Prior to Visit  Medication Sig Dispense Refill   acetaminophen  (TYLENOL ) 325 MG tablet Take 650 mg by mouth every 6 (six) hours as needed.     albuterol  (VENTOLIN  HFA) 108 (90 Base) MCG/ACT inhaler Inhale 2 puffs into the lungs every 6 (six) hours as needed for wheezing or shortness of breath. 8 g 2   amoxicillin -clavulanate (AUGMENTIN ) 875-125 MG tablet Take 1 tablet by mouth 2 (two) times daily for 5 days. 10 tablet 0    B Complex-Folic Acid  (SUPER B COMPLEX MAXI) TABS Take 1 tablet by mouth daily.     benzonatate  (TESSALON ) 100 MG capsule Take 1 capsule (100 mg total) by mouth 2 (two) times daily as needed for cough. 20 capsule 0   bisoprolol  (ZEBETA ) 5 MG tablet TAKE 1 TABLET BY MOUTH DAILY 90 tablet 3   donepezil  (ARICEPT ) 10 MG tablet Take 1 tablet (10 mg total) by mouth at bedtime. (Patient not taking: Reported on 03/29/2024) 90 tablet 1   empagliflozin  (JARDIANCE ) 10 MG TABS tablet Take 1 tablet (10 mg total) by mouth daily. 90 tablet 3   ezetimibe  (ZETIA ) 10 MG tablet Take 1 tablet (10 mg total) by mouth daily. 90 tablet 1   finasteride  (PROSCAR ) 5 MG tablet Take 1 tablet (5 mg total) by mouth daily. 90 tablet 3   furosemide  (LASIX ) 20 MG tablet Take 2 tablets (40 mg total) by mouth daily. 60 tablet 6   lisinopril  (ZESTRIL ) 2.5 MG tablet TAKE ONE TABLET BY MOUTH DAILY 90 tablet 3   loratadine  (CLARITIN ) 10 MG tablet TAKE ONE TABLET BY MOUTH DAILY AS NEEDED FOR ALLERGIES 90 tablet 1   methocarbamol  (ROBAXIN ) 500 MG tablet Take 1 tablet (500 mg total) by mouth every 8 (eight) hours as needed for muscle spasms. This can make you sleepy. 60 tablet 0   multivitamin (ONE-A-DAY MEN'S) TABS tablet Take 1 tablet by mouth daily.     nitroGLYCERIN  (NITROSTAT ) 0.4 MG SL tablet Place 1 tablet (0.4 mg total) under the tongue every 5 (five)  minutes as needed for chest pain. 25 tablet 1   omeprazole  (PRILOSEC) 40 MG capsule Take 1 capsule (40 mg total) by mouth daily before breakfast. 90 capsule 1   QUEtiapine  (SEROQUEL ) 25 MG tablet Take 1 tablet (25 mg total) by mouth at bedtime. 90 tablet 1   rivaroxaban  (XARELTO ) 20 MG TABS tablet Take 1 tablet (20 mg total) by mouth daily with supper. 90 tablet 1   rosuvastatin  (CRESTOR ) 40 MG tablet Take 1 tablet (40 mg total) by mouth daily. 90 tablet 1   triamcinolone  ointment (KENALOG ) 0.1 % Apply 1 application  topically daily as needed (irritation).     vitamin C (ASCORBIC  ACID) 500 MG tablet Take 500 mg by mouth daily.     No current facility-administered medications on file prior to visit.     ROS see history of present illness  Objective  Physical Exam Vitals:   04/03/24 1348  BP: 127/82  Pulse: 69  Temp: 98.4 F (36.9 C)  SpO2: 95%    BP Readings from Last 3 Encounters:  04/03/24 127/82  03/29/24 130/80  03/11/24 114/74   Wt Readings from Last 3 Encounters:  04/03/24 188 lb (85.3 kg)  03/29/24 195 lb 14.4 oz (88.9 kg)  03/11/24 195 lb (88.5 kg)    Physical Exam Constitutional:      General: He is not in acute distress.    Appearance: Normal appearance.  HENT:     Head: Normocephalic.     Nose: Nose normal.     Mouth/Throat:     Mouth: Mucous membranes are moist.     Pharynx: Oropharynx is clear.  Eyes:     Conjunctiva/sclera: Conjunctivae normal.     Pupils: Pupils are equal, round, and reactive to light.  Cardiovascular:     Rate and Rhythm: Normal rate and regular rhythm.     Heart sounds: Normal heart sounds.  Pulmonary:     Effort: Pulmonary effort is normal.     Breath sounds: Normal breath sounds.  Lymphadenopathy:     Cervical: No cervical adenopathy.  Skin:    General: Skin is warm and dry.  Neurological:     General: No focal deficit present.     Mental Status: He is alert.  Psychiatric:        Mood and Affect: Mood normal.        Behavior: Behavior normal.      Assessment/Plan: Please see individual problem list.  URI with cough and congestion Assessment & Plan: He has persistent sinusitis and bronchitis with congestion and cough. Lungs clear on exam. Augmentin  was ineffective. A possible viral to bacterial transition is suspected. Doxycycline  is prescribed for broader coverage. Start doxycycline  twice daily for seven days. Continue using benzonatate  and Mucinex  as needed. Use the inhaler if experiencing shortness of breath or wheezing. Maintain hydration to thin mucus. Return precautions given to  patient.      Return if symptoms worsen or fail to improve.   Leron Glance, NP-C [facility]

## 2024-04-03 NOTE — Assessment & Plan Note (Signed)
 He has persistent sinusitis and bronchitis with congestion and cough. Lungs clear on exam. Augmentin  was ineffective. A possible viral to bacterial transition is suspected. Doxycycline  is prescribed for broader coverage. Start doxycycline  twice daily for seven days. Continue using benzonatate  and Mucinex  as needed. Use the inhaler if experiencing shortness of breath or wheezing. Maintain hydration to thin mucus. Return precautions given to patient.

## 2024-04-03 NOTE — Telephone Encounter (Signed)
 Called w/no answer. Lvm informing patient

## 2024-04-15 ENCOUNTER — Encounter: Payer: Self-pay | Admitting: Physician Assistant

## 2024-05-07 NOTE — Addendum Note (Signed)
 Encounter addended by: Ayaat Jansma L on: 05/07/2024 10:22 AM  Actions taken: Imaging Exam ended

## 2024-05-28 ENCOUNTER — Ambulatory Visit

## 2024-05-28 DIAGNOSIS — I447 Left bundle-branch block, unspecified: Secondary | ICD-10-CM | POA: Diagnosis not present

## 2024-05-30 ENCOUNTER — Telehealth: Payer: Self-pay

## 2024-05-30 ENCOUNTER — Telehealth: Payer: Self-pay | Admitting: *Deleted

## 2024-05-30 ENCOUNTER — Other Ambulatory Visit: Payer: Self-pay

## 2024-05-30 DIAGNOSIS — Z79899 Other long term (current) drug therapy: Secondary | ICD-10-CM

## 2024-05-30 DIAGNOSIS — I4821 Permanent atrial fibrillation: Secondary | ICD-10-CM

## 2024-05-30 DIAGNOSIS — I429 Cardiomyopathy, unspecified: Secondary | ICD-10-CM

## 2024-05-30 DIAGNOSIS — D6869 Other thrombophilia: Secondary | ICD-10-CM

## 2024-05-30 DIAGNOSIS — I5020 Unspecified systolic (congestive) heart failure: Secondary | ICD-10-CM

## 2024-05-30 DIAGNOSIS — I447 Left bundle-branch block, unspecified: Secondary | ICD-10-CM

## 2024-05-30 DIAGNOSIS — Z95810 Presence of automatic (implantable) cardiac defibrillator: Secondary | ICD-10-CM

## 2024-05-30 DIAGNOSIS — I5022 Chronic systolic (congestive) heart failure: Secondary | ICD-10-CM

## 2024-05-30 LAB — CUP PACEART REMOTE DEVICE CHECK
Battery Remaining Longevity: 29 mo
Battery Voltage: 2.96 V
Brady Statistic AP VP Percent: 0 %
Brady Statistic AP VS Percent: 0 %
Brady Statistic AS VP Percent: 3.86 %
Brady Statistic AS VS Percent: 96.14 %
Brady Statistic RA Percent Paced: 0 %
Brady Statistic RV Percent Paced: 7.16 %
Date Time Interrogation Session: 20260120042302
HighPow Impedance: 68 Ohm
Implantable Lead Connection Status: 753985
Implantable Lead Connection Status: 753985
Implantable Lead Implant Date: 20130502
Implantable Lead Implant Date: 20130502
Implantable Lead Location: 753859
Implantable Lead Location: 753860
Implantable Lead Model: 5076
Implantable Lead Model: 6935
Implantable Pulse Generator Implant Date: 20181031
Lead Channel Impedance Value: 342 Ohm
Lead Channel Impedance Value: 494 Ohm
Lead Channel Impedance Value: 494 Ohm
Lead Channel Pacing Threshold Amplitude: 0.625 V
Lead Channel Pacing Threshold Pulse Width: 0.4 ms
Lead Channel Sensing Intrinsic Amplitude: 3.375 mV
Lead Channel Sensing Intrinsic Amplitude: 3.375 mV
Lead Channel Setting Pacing Amplitude: 2.5 V
Lead Channel Setting Pacing Pulse Width: 0.4 ms
Lead Channel Setting Sensing Sensitivity: 0.6 mV
Zone Setting Status: 755011
Zone Setting Status: 755011

## 2024-05-30 MED ORDER — AMIODARONE HCL 200 MG PO TABS: ORAL_TABLET | ORAL | 0 refills | Status: AC

## 2024-05-30 NOTE — Telephone Encounter (Signed)
 I left a voicemail for Chris Price to return my call. There are medication changes and a lab request from [name], NP. My Chart Message sent as well but I need verbal confirmation from Chris Price in regards to medication change.    As per Suzann, [name] would need to get labs at his convenience today or tomorrow - CBC, BMP, Mag, TSH, and T4. Patient should start amiodarone  200mg  BID x 2 weeks, then 200mg  daily. Patient can go ahead and start this and I'll see him in clinic as soon as my schedule/weather allow.  I will schedule labs and send prescription after speaking with the patient.

## 2024-05-30 NOTE — Telephone Encounter (Signed)
 High alert received from [employer]:  ICD: Scheduled remote reviewed. Normal device function.  Presenting rhythm: AF/VS-AF/VP 1 VF event, ATPx 2 to convert (03/26/2024) V-rate 250 bpm, sent to triage, high priority. Perm AF, progammed VVIR, Xareleto per EPIC EMR, 1233 NSVT, EGMs c/w AF with RVR Abnormal HF diagnostics this monitoring period Next remote transmission per protocol. ML, CVRS ______________________________________________________________________________  [name] and spoke with patient regarding episode and assess for symptoms  Patient and spouse stated that patient was very sick at home and in the bed for 2 weeks during time of event, but did not require hospitalization  Patient denied experiencing any symptoms such as CP, SOB, palpitations, blurred/lost vision, or lightheaded/dizziness during time and date of event  Patient confirmed compliance with all prescribed medications   Noted that patient is overdue for 6 month f/u   Message sent to EP scheduling team to get patient appointment in [facility] office  Patient notified of 6 month driving restrictions that started 03/26/24 and patient verbalized understanding   All questions and concerns were addressed at time of call   Patient and wife both very grateful for call from this RN  Scheduled remote already exported to MD for review from [employer]  Routing to Suzzan Riddle, NP as well for awareness since she saw patient last

## 2024-05-30 NOTE — Progress Notes (Signed)
 I spoke with Chris Price's Wife, relaying that as per Dr. Kennyth and Chantal Needle, NP,   Chris Price would need to get labs at his convenience today or tomorrow - CBC, BMP, Mag, TSH, and T4. Patient should start amiodarone  200mg  BID x 2 weeks, then 200mg  daily. Patient can go ahead and start this and I'll see him in clinic as soon as my schedule/weather allow.   Patient agrees to have labs done tomorrow at our office and start amiodarone . 30 day supply sent to [facility] in [place] so that the medication could be started. Regular prescription will then be sent to [facility] for maintenance. Patient has an appointment scheduled with Suzann on 06/06/2024.  Patient's wife verbalized understanding and all questions answered at this time. [name], NP and Dr. Kennyth notified.

## 2024-05-31 ENCOUNTER — Other Ambulatory Visit: Payer: Self-pay | Admitting: Emergency Medicine

## 2024-05-31 DIAGNOSIS — I4821 Permanent atrial fibrillation: Secondary | ICD-10-CM

## 2024-05-31 DIAGNOSIS — I447 Left bundle-branch block, unspecified: Secondary | ICD-10-CM

## 2024-05-31 DIAGNOSIS — D6869 Other thrombophilia: Secondary | ICD-10-CM

## 2024-05-31 DIAGNOSIS — Z79899 Other long term (current) drug therapy: Secondary | ICD-10-CM

## 2024-05-31 DIAGNOSIS — I5020 Unspecified systolic (congestive) heart failure: Secondary | ICD-10-CM

## 2024-05-31 DIAGNOSIS — I5022 Chronic systolic (congestive) heart failure: Secondary | ICD-10-CM

## 2024-05-31 DIAGNOSIS — I429 Cardiomyopathy, unspecified: Secondary | ICD-10-CM

## 2024-05-31 DIAGNOSIS — Z95810 Presence of automatic (implantable) cardiac defibrillator: Secondary | ICD-10-CM

## 2024-05-31 NOTE — Progress Notes (Signed)
 Remote ICD Transmission

## 2024-06-01 LAB — SPECIMEN STATUS REPORT

## 2024-06-02 ENCOUNTER — Ambulatory Visit: Payer: Self-pay | Admitting: Cardiology

## 2024-06-04 ENCOUNTER — Other Ambulatory Visit: Payer: Self-pay

## 2024-06-04 LAB — BASIC METABOLIC PANEL WITH GFR
BUN/Creatinine Ratio: 16 (ref 10–24)
BUN: 16 mg/dL (ref 8–27)
CO2: 24 mmol/L (ref 20–29)
Calcium: 9.3 mg/dL (ref 8.6–10.2)
Chloride: 106 mmol/L (ref 96–106)
Creatinine, Ser: 0.98 mg/dL (ref 0.76–1.27)
Glucose: 97 mg/dL (ref 70–99)
Potassium: 4.3 mmol/L (ref 3.5–5.2)
Sodium: 144 mmol/L (ref 134–144)
eGFR: 76 mL/min/{1.73_m2}

## 2024-06-04 LAB — CBC
Hematocrit: 49 % (ref 37.5–51.0)
Hemoglobin: 15.9 g/dL (ref 13.0–17.7)
MCH: 32.3 pg (ref 26.6–33.0)
MCHC: 32.4 g/dL (ref 31.5–35.7)
MCV: 99 fL — ABNORMAL HIGH (ref 79–97)
Platelets: 150 10*3/uL (ref 150–450)
RBC: 4.93 x10E6/uL (ref 4.14–5.80)
RDW: 13.5 % (ref 11.6–15.4)
WBC: 4.5 10*3/uL (ref 3.4–10.8)

## 2024-06-04 LAB — T4, FREE: Free T4: 1.09 ng/dL (ref 0.82–1.77)

## 2024-06-04 LAB — MAGNESIUM: Magnesium: 1.9 mg/dL (ref 1.6–2.3)

## 2024-06-04 LAB — TSH: TSH: 1.06 u[IU]/mL (ref 0.450–4.500)

## 2024-06-04 MED ORDER — AMIODARONE HCL 200 MG PO TABS
200.0000 mg | ORAL_TABLET | Freq: Every day | ORAL | 3 refills | Status: AC
  Filled 2024-06-04: qty 90, 90d supply, fill #0

## 2024-06-04 NOTE — Progress Notes (Signed)
 Pharmacy change from [facility] for immediate start to [facility] for maintenance refills.

## 2024-06-05 NOTE — Progress Notes (Unsigned)
 "     Electrophysiology Clinic Note    Date:  06/06/2024  Patient ID:  Chris, Price November 27, 1940, MRN [id number] PCP:  [name], MD  Cardiologist:  None  Electrophysiologist:  [name], MD  Electrophysiology APP:  Maleeka Sabatino, NP    Discussed the use of AI scribe software for clinical note transcription with the patient, who gave verbal consent to proceed.   Patient Profile    Chief Complaint: VT w ATP  History of Present Illness: Chris Price is a 84 y.o. male with PMH notable for NICM, HFrEF, s/p ICD, mild-mod non-obs CAD, bradycardia, perm AFib, VT, OSA on CPAP ; seen today for [name], MD (Previously Dr. Fernande)  for acute visit due to VT.    Historically hypotension has limited up-titration of GDMT (entresto  and spiro). Previously was on digoxin  but had increase in VP, so was stopped and bisoprolol  lowered to 5mg  daily.   Device clinic alerted 08/2023 for VT, patient asymptomatic. Updated LHC with mild-mod CAD, not amenable to intervention.  He established with HF team, and at my last visit with him 11/2023, he was doing well on higher dose of lasix .   Device clinic was alerted last week to VT episode w ATP x 2 in November, and he was started on amiodarone .   Today, he says that he was asymptomatic during VT episode in November but does recall having significant upper resp symptoms with congestion. He took OTC coricidin for symptoms. He had no chest pain, chest pressure, palpitations, dizziness or presyncope.  He has not started amiodarone , have not received it from pharmacy, supposed to be received today.   He feels well today without acute complaints except for being told he can not drive.      Arrhythmia/Device History MDT dual chamber ICD, imp 2013; HFrEF Gen change 02/2017  AAD - Amio - stopped d/t ineffective (for AFib), restarted for VT    ROS:  Please see the history of present illness. All other systems are reviewed and otherwise  negative.    Physical Exam    VS:  BP 130/66 (BP Location: Left Arm, Patient Position: Sitting, Cuff Size: Normal)   Pulse 61   Ht 5' 6 (1.676 m)   Wt 194 lb 3.2 oz (88.1 kg)   SpO2 96%   BMI 31.34 kg/m  BMI: Body mass index is 31.34 kg/m.           Wt Readings from Last 3 Encounters:  06/06/24 194 lb 3.2 oz (88.1 kg)  04/03/24 188 lb (85.3 kg)  03/29/24 195 lb 14.4 oz (88.9 kg)     GEN- The patient is well appearing, alert and oriented x 3 today.   Lungs- Clear to ausculation bilaterally, normal work of breathing.  Heart- Irregularly irregular rate and rhythm, no murmurs, rubs or gallops Extremities- No peripheral edema, warm, dry Skin-   device pocket well-healed, no tethering    Brief check performed without iterative lead testing/measurements No further sustained VT episodes since November    Studies Reviewed   Previous EP, cardiology notes.    EKG is ordered. Personal review of EKG from today shows:    EKG Interpretation Date/Time:  Thursday June 06 2024 09:30:40 EST Ventricular Rate:  61 PR Interval:    QRS Duration:  154 QT Interval:  476 QTC Calculation: 479 R Axis:   -84  Text Interpretation: Atrial fibrillation Right bundle branch block Left anterior fascicular block Bifascicular block Confirmed by Cloris Flippo (  [id number]) on 06/06/2024 12:42:33 PM    TTE, 02/07/2024  1. Left ventricular ejection fraction, by estimation, is 30 to 35%. Left ventricular ejection fraction by 3D volume is 39 %. The left ventricle has moderately decreased function. The left ventricle has no regional wall motion abnormalities. There is mild concentric left ventricular hypertrophy. Left ventricular diastolic parameters are indeterminate. The average left ventricular global longitudinal strain is -10.5 %. The global longitudinal strain is abnormal.   2. Right ventricular systolic function is moderately reduced. The right ventricular size is normal.   3. Left atrial size was  severely dilated.   4. Right atrial size was moderately dilated.   5. The mitral valve is normal in structure. Mild mitral valve regurgitation. No evidence of mitral stenosis.   6. Tricuspid valve regurgitation is moderate.   7. The aortic valve is tricuspid. There is moderate calcification of the aortic valve. Aortic valve regurgitation is mild. Aortic valve sclerosis/calcification is present, without any evidence of aortic stenosis.  8. Aortic dilatation noted. There is mild dilatation of the ascending aorta, measuring 40mm.   9. The inferior vena cava is dilated in size with <50% respiratory variability, suggesting right atrial pressure of 15 mmHg.   R/LHC, 11/14/2023 Conclusions: Predominantly mild-moderate multivessel coronary artery disease, as detailed below.  There is severe disease involving diagonal branches, which are too small for percutaneous intervention. Mildly elevated left and right heart filling pressures. Mild-moderate pulmonary hypertension. Reduced Fick cardiac output/index.   Cardiac Pet CT, 11/02/2023   Reversible perfusion defect in apical inferior wall and apex, suggesting small area of ischemia.  However TID (1.18) is present which is a high risk finding.  LVEF is severely reduced (27%).  While myocardial blood flow reserve is normal (2.47), this is due to a low resting flow; stress flow is significantly reduced globally and in each coronary distribution.  Severe three-vessel coronary calcifications present on CT.  Overall, would classify study as high risk and recommend cardiac catheterization   LV perfusion is abnormal. There is evidence of ischemia. Defect 1: There is a small defect with mild reduction in uptake present in the apical inferior and apex location(s) that is reversible. There is abnormal wall motion in the defect area. Consistent with ischemia.   Rest left ventricular function is abnormal. Rest EF: 27%. Stress left ventricular function is abnormal. Stress EF:  29%. End diastolic cavity size is moderately enlarged. End systolic cavity size is moderately enlarged.   Myocardial blood flow was computed to be 0.51ml/g/min at rest and 1.26ml/g/min at stress. Global myocardial blood flow reserve was 2.47 and was normal.   Coronary calcium  was present on the attenuation correction CT images. Severe coronary calcifications were present. Coronary calcifications were present in the left anterior descending artery, left circumflex artery and right coronary artery distribution(s).   Findings are consistent with ischemia. The study is high risk.   NM myocardial, 08/27/2020 Pharmacological myocardial perfusion imaging study with no significant ischemia Small region fixed defect in the apical region LV is dilated Mild global hypokinesis, hypokinesis most notable in the septal wall ,  EF estimated at 35% CT attenuation correction images with three-vessel coronary calcification No EKG changes concerning for ischemia at peak stress or in recovery. Moderate risk scan   TTE, 08/21/2020 1. Left ventricular ejection fraction, by estimation, is 30 to 35%. The left ventricle has moderate to severely decreased function. The left ventricle demonstrates global hypokinesis. There is mild left ventricular hypertrophy. Left ventricular diastolic parameters are indeterminate.  2. Right ventricular systolic function is low normal. The right ventricular size is normal.   3. Left atrial size was severely dilated.   4. Right atrial size was severely dilated.   5. The mitral valve is normal in structure. Mild mitral valve regurgitation.   6. Tricuspid valve regurgitation is mild to moderate.   7. The aortic valve is tricuspid. Aortic valve regurgitation is mild. Mild to moderate aortic valve sclerosis/calcification is present, without any evidence of aortic stenosis.    Assessment and Plan     #) NICM #) VT #) ICD in situ #) amiodarone  monitoring Recent episode of VT w ATP x  2 Recent ischemic evaluation without ischemic disease No chest pain, chest pressure correlate with VT episode, do not favor updating ischemic eval Amiodarone  recommended to started thereafter, but has not started yet Start Amiodarone  200mg  BID x 2 weeks, then 200mg  daily Recent labs stable No driving for 6 months (starting November) per [facility]  #) perm afib #) Hypercoag d/t perm afib CHA2DS2-VASc Score = at least 5 [CHF History: 1, HTN History: 1, Diabetes History: 0, Stroke History: 0, Vascular Disease History: 1, Age Score: 2, Gender Score: 0].  Therefore, the patient's annual risk of stroke is 7.2 %.    Stroke ppx - 20mg  xarelto  daily, appropriately dosed No bleeding concerns        Current medicines are reviewed at length with the patient today.   The patient does not have concerns regarding his medicines.  The following changes were made today:   START 200mg  amiodarone  - take twice a day for 2 weeks, then daily  Labs/ tests ordered today include:  Orders Placed This Encounter  Procedures   EKG 12-Lead     Disposition: Follow up with Dr. Kennyth or EP APP in 3 months   Signed, Chantal Needle, NP  06/06/24  12:49 PM  Electrophysiology [facility] "

## 2024-06-06 ENCOUNTER — Encounter: Payer: Self-pay | Admitting: Cardiology

## 2024-06-06 ENCOUNTER — Ambulatory Visit: Attending: Cardiology | Admitting: Cardiology

## 2024-06-06 VITALS — BP 130/66 | HR 61 | Ht 66.0 in | Wt 194.2 lb

## 2024-06-06 DIAGNOSIS — I5022 Chronic systolic (congestive) heart failure: Secondary | ICD-10-CM

## 2024-06-06 DIAGNOSIS — Z95810 Presence of automatic (implantable) cardiac defibrillator: Secondary | ICD-10-CM | POA: Diagnosis not present

## 2024-06-06 DIAGNOSIS — I472 Ventricular tachycardia, unspecified: Secondary | ICD-10-CM | POA: Diagnosis not present

## 2024-06-06 DIAGNOSIS — I4821 Permanent atrial fibrillation: Secondary | ICD-10-CM

## 2024-06-06 NOTE — Patient Instructions (Signed)
 Medication Instructions:  Your physician recommends the following medication changes.    START TAKING: Amiodarone  200 mg TWICE a day for TWO weeks. Then REDUCE to 200 mg Once a day.    *If you need a refill on your cardiac medications before your next appointment, please call your pharmacy*  Lab Work: No labs ordered today    Testing/Procedures: No labs ordered today   Follow-Up:  Heart Failure Clinic follow up in April.  At [facility], you and your health needs are our priority.  As part of our continuing mission to provide you with exceptional heart care, our providers are all part of one team.  This team includes your primary Cardiologist (physician) and Advanced Practice Providers or APPs (Physician Assistants and Nurse Practitioners) who all work together to provide you with the care you need, when you need it.  Your next appointment:   3 month(s)  Provider:   [name], NP

## 2024-06-10 ENCOUNTER — Other Ambulatory Visit: Payer: Self-pay

## 2024-06-10 ENCOUNTER — Other Ambulatory Visit (HOSPITAL_COMMUNITY): Payer: Self-pay

## 2024-06-11 ENCOUNTER — Other Ambulatory Visit: Payer: Self-pay

## 2024-07-18 ENCOUNTER — Ambulatory Visit

## 2024-08-27 ENCOUNTER — Ambulatory Visit

## 2024-09-02 ENCOUNTER — Ambulatory Visit: Admitting: Internal Medicine

## 2024-09-09 ENCOUNTER — Ambulatory Visit: Admitting: Cardiology

## 2024-09-10 ENCOUNTER — Ambulatory Visit: Admitting: Family Medicine

## 2024-09-12 ENCOUNTER — Ambulatory Visit: Admitting: Urology

## 2024-09-13 ENCOUNTER — Ambulatory Visit: Payer: Self-pay | Admitting: Neurology

## 2024-11-26 ENCOUNTER — Ambulatory Visit

## 2025-02-25 ENCOUNTER — Ambulatory Visit

## 2025-05-27 ENCOUNTER — Ambulatory Visit
